# Patient Record
Sex: Female | Born: 1955
Health system: Southern US, Community
[De-identification: ages and names within clinical notes are randomized; demographics above are authoritative.]

## PROBLEM LIST (undated history)

## (undated) DIAGNOSIS — K529 Noninfective gastroenteritis and colitis, unspecified: Secondary | ICD-10-CM

## (undated) DIAGNOSIS — L409 Psoriasis, unspecified: Secondary | ICD-10-CM

## (undated) DIAGNOSIS — E039 Hypothyroidism, unspecified: Secondary | ICD-10-CM

## (undated) DIAGNOSIS — M109 Gout, unspecified: Secondary | ICD-10-CM

## (undated) DIAGNOSIS — T4145XA Adverse effect of unspecified anesthetic, initial encounter: Secondary | ICD-10-CM

## (undated) DIAGNOSIS — I10 Essential (primary) hypertension: Secondary | ICD-10-CM

## (undated) DIAGNOSIS — J189 Pneumonia, unspecified organism: Secondary | ICD-10-CM

## (undated) DIAGNOSIS — C50919 Malignant neoplasm of unspecified site of unspecified female breast: Secondary | ICD-10-CM

## (undated) DIAGNOSIS — Z853 Personal history of malignant neoplasm of breast: Secondary | ICD-10-CM

## (undated) DIAGNOSIS — K5190 Ulcerative colitis, unspecified, without complications: Secondary | ICD-10-CM

## (undated) DIAGNOSIS — Z923 Personal history of irradiation: Secondary | ICD-10-CM

## (undated) DIAGNOSIS — T8859XA Other complications of anesthesia, initial encounter: Secondary | ICD-10-CM

## (undated) DIAGNOSIS — E079 Disorder of thyroid, unspecified: Secondary | ICD-10-CM

## (undated) HISTORY — DX: Disorder of thyroid, unspecified: E07.9

## (undated) HISTORY — PX: TUBAL LIGATION: SHX77

## (undated) HISTORY — DX: Personal history of malignant neoplasm of breast: Z85.3

## (undated) HISTORY — DX: Pneumonia, unspecified organism: J18.9

## (undated) HISTORY — PX: BREAST BIOPSY: SHX20

## (undated) HISTORY — DX: Ulcerative colitis, unspecified, without complications: K51.90

## (undated) HISTORY — DX: Psoriasis, unspecified: L40.9

## (undated) HISTORY — DX: Gout, unspecified: M10.9

## (undated) HISTORY — DX: Noninfective gastroenteritis and colitis, unspecified: K52.9

## (undated) HISTORY — DX: Malignant neoplasm of unspecified site of unspecified female breast: C50.919

## (undated) HISTORY — PX: WISDOM TOOTH EXTRACTION: SHX21

---

## 1994-07-22 HISTORY — PX: DILATION AND CURETTAGE OF UTERUS: SHX78

## 2001-01-15 ENCOUNTER — Encounter: Payer: Self-pay | Admitting: *Deleted

## 2001-01-15 ENCOUNTER — Ambulatory Visit (HOSPITAL_COMMUNITY): Admission: RE | Admit: 2001-01-15 | Discharge: 2001-01-15 | Payer: Self-pay | Admitting: *Deleted

## 2001-01-20 ENCOUNTER — Other Ambulatory Visit: Admission: RE | Admit: 2001-01-20 | Discharge: 2001-01-20 | Payer: Self-pay | Admitting: *Deleted

## 2001-10-09 ENCOUNTER — Ambulatory Visit (HOSPITAL_COMMUNITY): Admission: RE | Admit: 2001-10-09 | Discharge: 2001-10-09 | Payer: Self-pay | Admitting: Internal Medicine

## 2001-10-09 HISTORY — PX: COLONOSCOPY: SHX174

## 2002-01-15 ENCOUNTER — Ambulatory Visit (HOSPITAL_COMMUNITY): Admission: RE | Admit: 2002-01-15 | Discharge: 2002-01-15 | Payer: Self-pay | Admitting: *Deleted

## 2002-01-15 ENCOUNTER — Encounter: Payer: Self-pay | Admitting: *Deleted

## 2002-01-28 ENCOUNTER — Encounter: Payer: Self-pay | Admitting: *Deleted

## 2002-01-28 ENCOUNTER — Ambulatory Visit (HOSPITAL_COMMUNITY): Admission: RE | Admit: 2002-01-28 | Discharge: 2002-01-28 | Payer: Self-pay | Admitting: *Deleted

## 2003-01-17 ENCOUNTER — Ambulatory Visit (HOSPITAL_COMMUNITY): Admission: RE | Admit: 2003-01-17 | Discharge: 2003-01-17 | Payer: Self-pay | Admitting: *Deleted

## 2003-01-17 ENCOUNTER — Encounter: Payer: Self-pay | Admitting: *Deleted

## 2004-01-30 ENCOUNTER — Ambulatory Visit (HOSPITAL_COMMUNITY): Admission: RE | Admit: 2004-01-30 | Discharge: 2004-01-30 | Payer: Self-pay | Admitting: *Deleted

## 2005-01-31 ENCOUNTER — Ambulatory Visit (HOSPITAL_COMMUNITY): Admission: RE | Admit: 2005-01-31 | Discharge: 2005-01-31 | Payer: Self-pay | Admitting: *Deleted

## 2005-02-20 ENCOUNTER — Ambulatory Visit (HOSPITAL_COMMUNITY): Admission: RE | Admit: 2005-02-20 | Discharge: 2005-02-20 | Payer: Self-pay | Admitting: *Deleted

## 2005-08-14 ENCOUNTER — Ambulatory Visit (HOSPITAL_COMMUNITY): Admission: RE | Admit: 2005-08-14 | Discharge: 2005-08-14 | Payer: Self-pay | Admitting: *Deleted

## 2005-11-19 DIAGNOSIS — K5190 Ulcerative colitis, unspecified, without complications: Secondary | ICD-10-CM

## 2005-11-19 HISTORY — DX: Ulcerative colitis, unspecified, without complications: K51.90

## 2005-12-05 ENCOUNTER — Ambulatory Visit (HOSPITAL_COMMUNITY): Admission: RE | Admit: 2005-12-05 | Discharge: 2005-12-05 | Payer: Self-pay | Admitting: Family Medicine

## 2005-12-10 ENCOUNTER — Encounter (INDEPENDENT_AMBULATORY_CARE_PROVIDER_SITE_OTHER): Payer: Self-pay | Admitting: *Deleted

## 2005-12-10 ENCOUNTER — Ambulatory Visit: Payer: Self-pay | Admitting: Internal Medicine

## 2005-12-10 ENCOUNTER — Ambulatory Visit (HOSPITAL_COMMUNITY): Admission: RE | Admit: 2005-12-10 | Discharge: 2005-12-10 | Payer: Self-pay | Admitting: Internal Medicine

## 2005-12-10 HISTORY — PX: COLONOSCOPY: SHX174

## 2005-12-18 ENCOUNTER — Ambulatory Visit: Payer: Self-pay | Admitting: Internal Medicine

## 2006-01-06 ENCOUNTER — Ambulatory Visit: Payer: Self-pay | Admitting: Internal Medicine

## 2006-02-17 ENCOUNTER — Encounter: Admission: RE | Admit: 2006-02-17 | Discharge: 2006-02-17 | Payer: Self-pay | Admitting: Obstetrics and Gynecology

## 2006-02-25 ENCOUNTER — Ambulatory Visit: Payer: Self-pay | Admitting: Internal Medicine

## 2006-02-26 ENCOUNTER — Ambulatory Visit: Payer: Self-pay | Admitting: Internal Medicine

## 2006-02-26 ENCOUNTER — Encounter (INDEPENDENT_AMBULATORY_CARE_PROVIDER_SITE_OTHER): Payer: Self-pay | Admitting: *Deleted

## 2006-02-26 ENCOUNTER — Ambulatory Visit (HOSPITAL_COMMUNITY): Admission: RE | Admit: 2006-02-26 | Discharge: 2006-02-26 | Payer: Self-pay | Admitting: Internal Medicine

## 2006-02-26 HISTORY — PX: FLEXIBLE SIGMOIDOSCOPY: SHX1649

## 2006-03-10 ENCOUNTER — Ambulatory Visit: Payer: Self-pay | Admitting: Internal Medicine

## 2006-04-07 ENCOUNTER — Ambulatory Visit: Payer: Self-pay | Admitting: Internal Medicine

## 2006-05-19 ENCOUNTER — Ambulatory Visit: Payer: Self-pay | Admitting: Internal Medicine

## 2006-07-04 ENCOUNTER — Ambulatory Visit: Payer: Self-pay | Admitting: Internal Medicine

## 2007-02-18 ENCOUNTER — Ambulatory Visit: Payer: Self-pay | Admitting: Internal Medicine

## 2007-02-20 ENCOUNTER — Ambulatory Visit (HOSPITAL_COMMUNITY): Admission: RE | Admit: 2007-02-20 | Discharge: 2007-02-20 | Payer: Self-pay | Admitting: Specialist

## 2007-03-15 ENCOUNTER — Emergency Department (HOSPITAL_COMMUNITY): Admission: EM | Admit: 2007-03-15 | Discharge: 2007-03-15 | Payer: Self-pay | Admitting: Emergency Medicine

## 2007-04-08 ENCOUNTER — Encounter (HOSPITAL_COMMUNITY): Admission: RE | Admit: 2007-04-08 | Discharge: 2007-04-21 | Payer: Self-pay | Admitting: Endocrinology

## 2007-08-21 ENCOUNTER — Ambulatory Visit: Payer: Self-pay | Admitting: Internal Medicine

## 2008-02-09 ENCOUNTER — Ambulatory Visit: Payer: Self-pay | Admitting: Internal Medicine

## 2008-02-24 ENCOUNTER — Ambulatory Visit (HOSPITAL_COMMUNITY): Admission: RE | Admit: 2008-02-24 | Discharge: 2008-02-24 | Payer: Self-pay | Admitting: Obstetrics & Gynecology

## 2008-03-23 ENCOUNTER — Other Ambulatory Visit: Admission: RE | Admit: 2008-03-23 | Discharge: 2008-03-23 | Payer: Self-pay | Admitting: Obstetrics & Gynecology

## 2008-11-28 ENCOUNTER — Encounter (HOSPITAL_COMMUNITY): Admission: RE | Admit: 2008-11-28 | Discharge: 2008-12-28 | Payer: Self-pay | Admitting: Endocrinology

## 2009-01-31 ENCOUNTER — Encounter (INDEPENDENT_AMBULATORY_CARE_PROVIDER_SITE_OTHER): Payer: Self-pay | Admitting: *Deleted

## 2009-03-01 DIAGNOSIS — R197 Diarrhea, unspecified: Secondary | ICD-10-CM

## 2009-03-01 DIAGNOSIS — R109 Unspecified abdominal pain: Secondary | ICD-10-CM | POA: Insufficient documentation

## 2009-03-01 DIAGNOSIS — K649 Unspecified hemorrhoids: Secondary | ICD-10-CM | POA: Insufficient documentation

## 2009-03-07 ENCOUNTER — Ambulatory Visit (HOSPITAL_COMMUNITY): Admission: RE | Admit: 2009-03-07 | Discharge: 2009-03-07 | Payer: Self-pay | Admitting: Obstetrics & Gynecology

## 2009-03-07 ENCOUNTER — Ambulatory Visit: Payer: Self-pay | Admitting: Internal Medicine

## 2009-03-11 ENCOUNTER — Encounter: Payer: Self-pay | Admitting: Internal Medicine

## 2009-03-15 LAB — CONVERTED CEMR LAB
BUN: 16 mg/dL (ref 6–23)
CO2: 25 meq/L (ref 19–32)
Calcium: 8.9 mg/dL (ref 8.4–10.5)
Creatinine, Ser: 0.71 mg/dL (ref 0.40–1.20)

## 2009-03-30 ENCOUNTER — Other Ambulatory Visit: Admission: RE | Admit: 2009-03-30 | Discharge: 2009-03-30 | Payer: Self-pay | Admitting: Obstetrics & Gynecology

## 2009-06-19 ENCOUNTER — Telehealth (INDEPENDENT_AMBULATORY_CARE_PROVIDER_SITE_OTHER): Payer: Self-pay

## 2009-06-28 ENCOUNTER — Telehealth (INDEPENDENT_AMBULATORY_CARE_PROVIDER_SITE_OTHER): Payer: Self-pay

## 2009-06-30 ENCOUNTER — Ambulatory Visit: Payer: Self-pay | Admitting: Gastroenterology

## 2009-06-30 DIAGNOSIS — K5190 Ulcerative colitis, unspecified, without complications: Secondary | ICD-10-CM | POA: Insufficient documentation

## 2009-07-26 ENCOUNTER — Telehealth (INDEPENDENT_AMBULATORY_CARE_PROVIDER_SITE_OTHER): Payer: Self-pay

## 2010-03-09 ENCOUNTER — Ambulatory Visit (HOSPITAL_COMMUNITY): Admission: RE | Admit: 2010-03-09 | Discharge: 2010-03-09 | Payer: Self-pay | Admitting: Obstetrics & Gynecology

## 2010-04-05 ENCOUNTER — Other Ambulatory Visit: Admission: RE | Admit: 2010-04-05 | Discharge: 2010-04-05 | Payer: Self-pay | Admitting: Obstetrics & Gynecology

## 2010-04-06 ENCOUNTER — Ambulatory Visit: Payer: Self-pay | Admitting: Internal Medicine

## 2010-06-28 ENCOUNTER — Telehealth (INDEPENDENT_AMBULATORY_CARE_PROVIDER_SITE_OTHER): Payer: Self-pay

## 2010-08-12 ENCOUNTER — Encounter: Payer: Self-pay | Admitting: Obstetrics and Gynecology

## 2010-08-13 ENCOUNTER — Encounter: Payer: Self-pay | Admitting: Obstetrics & Gynecology

## 2010-08-21 NOTE — Progress Notes (Signed)
  Phone Note Call from Patient   Caller: Patient Summary of Call: Pt left VM, she needs updated Rx for her current prescription for Lialda. She was on three tablets daily and is now on four  tablets daily. Please send new Rx to [facility] Her cell number is [phone] if any questions. Initial call taken by: Waldon Merl LPN,  July 26, 7844 4:01 PM     Appended Document: lialda    Prescriptions: LIALDA 1.2 GM TBEC (MESALAMINE) 4 by mouth daily  #120 x 11   Entered and Authorized by:   Laureen Ochs. Bernarda Caffey   Signed by:   Laureen Ochs Bernarda Caffey on 07/26/2009   Method used:   Electronically to        [facility] (retail)       [address]       [place], [place]  [address]       Ph: [phone]       Fax: [phone]   RxID:   [id number]

## 2010-08-21 NOTE — Assessment & Plan Note (Signed)
Summary: fu ov in 1 yr/colitis/jbb   Visit Type:  Follow-up Visit Primary Care Provider:  luking  Chief Complaint:  follow up 1 year- doing good.  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 Here for routine followup. Had a flare the first of the year. Brief course of prednisone took care of that. On Lialda  4.8 g daily; doing well with one to 2 bowel movements daily; really no GI symptoms now.  Blood pressure today 138/100.  Current Problems (verified): 1)  Inflammatory Bowel Disease  (ICD-558.9) 2)  Hx of Hemorrhoids  (ICD-455.6) 3)  Hx of Abdominal Cramps  (ICD-789.00) 4)  Hx of Diarrhea  (ICD-787.91)  Current Medications (verified): 1)  Lialda 1.2 Gm Tbec (Mesalamine) .... 4 By Mouth Daily  Allergies (verified): No Known Drug Allergies  Past History:  Past Surgical History: Last updated: 03/01/2009 Tubal ligation Wisdom teeth extraction  Family History: Last updated: 2009-04-03 Father: deceased age 28's   ? heart Mother: Living age 45  healthy Siblings: one brother   healthy  Social History: Last updated: April 03, 2009 Marital Status: Married Children: one  Occupation: Medical illustrator  Vital Signs:  Patient profile:   55 year old female Height:      64 inches Weight:      168 pounds BMI:     28.94 Temp:     98.8 degrees F oral Pulse rate:   60 / minute BP sitting:   138 / 100  (left arm) Cuff size:   regular  Vitals Entered By: Burnadette Peter LPN (April 06, 1469 8:36 AM)  Physical Exam  General:  alert conversant comfortable appearing Abdomen:  somewhat obese positive bowel sounds soft, nontender without appreciable mass organomegaly  Impression & Recommendations: Impression: Indeterminate colitis in remission on lialda. Elevated blood pressure today.  Recommendations: Continue lialda 4.8 g daily  Office visit here one year with BMET  Patient is to go by her PCP's  office for a repeat the blood pressure  check.  Other Orders: Future Orders: T-Basic Metabolic Panel ([id number]) ... 04/01/2011  Appended Document: Orders Update    Clinical Lists Changes  Orders: Added new Service order of Est. Patient Level III (37096) - Signed

## 2010-08-21 NOTE — Progress Notes (Signed)
Summary: lialda rx  Phone Note Call from Patient Call back at Home Phone [phone]   Caller: Patient Summary of Call: pt called- needs refills on her [place] . Pt uses [facility]. Initial call taken by: Burnadette Peter LPN,  June 28, 2102 9:11 AM     Appended Document: lialda rx    Prescriptions: LIALDA 1.2 GM TBEC (MESALAMINE) 4 by mouth daily  #120 x 11   Entered and Authorized by:   Laban Emperor NP   Signed by:   Laban Emperor NP on 06/28/2010   Method used:   Faxed to ...       [facility] (retail)       [address]       [place], [place]  [address]       Ph: [phone]       Fax: [phone]   RxID:   [id number]

## 2010-12-04 NOTE — Assessment & Plan Note (Signed)
NAMEGRACEANN, Beth Hamilton                CHART#:  [id number]   DATE:  08/21/2007                       DOB:  [birth year]   FOLLOW-UP:  Indeterminate colitis.   Ms. Bacha has done very well.  She has had some problems with  hyperthyroidism; has had a fine-needle aspirate biopsy of her thyroid  and is on some questioned suppressive agent for hyperthyroidism.  She  does not know what she is taking right now (although I believe it may be  methimazole).  Her colitis appears to be in remission.  She is having 1  or 2 bowel movements a day late, and no blood per rectum.  No diarrhea.  No __________.  No abdominal pain.  She feels well on 4.8 grams of  Lialda daily.  Her renal function was normal back on February 23, 2007.  She is concerned about the high cost of a month's worth of Lialda  (nearly $500 without a drug card).  We had some discussion about the  pros and cons of switching her to something else versus backing off on  therapy.  She has been doing well now for several months.   CURRENT MEDICATIONS:  See updated list.   ALLERGIES:  NO KNOWN DRUG ALLERGIES.   PHYSICAL EXAMINATION:  On exam today, she appears well.  Weight 154,  height 5 foot 4 inches, temperature 98.1, BP 138/90, pulse 76.  CHEST:  Lungs are clear to auscultation.  CARDIAC:  Regular rate and rhythm, without murmur, gallop or rub.  ABDOMEN:  Nondistended, positive bowel sounds.  Soft, nontender.  Without appreciable mass or organomegaly.   ASSESSMENT:  Indeterminate colitis.  In remission on 4.8 grams of  mesalamine daily in the form of Lialda.   RECOMMENDATIONS:  I told Ms. Rosengrant we could probably easily drop back  to 3 Lialda tablets daily, and stay at that dose.  I plan to see her  back in 6 months.  I have given her  samples of Lialda.  If she has any recurrence of symptoms whatsoever,  she is to let me know in the interim.  Otherwise, plan to see her back  in 6 months.       [name], M.D.  Electronically Signed     RMR/MEDQ  D:  08/21/2007  T:  08/22/2007  Job:  [id number]   cc:   Nicki Reaper A. Wolfgang Phoenix, MD

## 2010-12-04 NOTE — Assessment & Plan Note (Signed)
[name], Hamilton                CHART#:  [id number]   DATE:  02/09/2008                       DOB:  04-Feb-1956   Followup indeterminate colitis.  Last seen on 08/21/07.  Since last been  seen, she has done and continues to do well on a Lialda.  She is taking  3 capsules daily.  One bowel movement each morning, sometimes 2.  No  tenesmus.  No abdominal pain.  No hematochezia.  She has had absolutely  no diarrhea.  She feels well.  Cost along the way with [facility] has been a  concern, for which a coupon card she is getting a break and tolerating  3.6 g of Lialda very well.  Her creatinine on 02/23/08 was 0.72.  She  has a history of toxic multinodular goiter.  She is taking Tapazole.  She tells me things were under good control under the care of Dr.  Ronnald Collum.   CURRENT MEDICATIONS:  Lialda and Tapazole.   ALLERGIES:  No known drug allergies.   FAMILY HISTORY:  There is no family history of inflammatory bowel  disease or colorectal neoplasia.   PHYSICAL EXAMINATION:  GENERAL:  A pleasant 55 year old lady resting  comfortably.  VITAL SIGNS:  Weight 154, height 5 feet 4 inch, temperature 93, BP  120/88, and pulse 80.  ABDOMEN:  Flat.  Positive bowel sounds.  Entirely soft and nontender  without appreciable mass or organomegaly.   ASSESSMENT:  Indeterminate colitis, in remission on Lialda.   RECOMMENDATIONS:  We discussed the possibilities of changing to another  mesalamine preparation, but I am unknown of what the bottom line cost  would be to the patient.  We mutually agreed since she is really doing  extremely well on her current regimen of Lialda, not to  change anything at this time.  She is to continue her current dose of  Lialda (3.6 g daily).  I will plan to see her back in 1 year and p.r.n.       R. Garfield Cornea, M.D.  Electronically Signed     RMR/MEDQ  D:  02/09/2008  T:  02/09/2008  Job:  [id number]   cc:   Lenard Simmer, M.D.  Dr. Wolfgang Phoenix

## 2010-12-04 NOTE — Assessment & Plan Note (Signed)
NAMEBEATRYCE, Beth Hamilton                CHART#:  [id number]   DATE:  02/18/2007                       DOB:  09-16-1955   Follow up indeterminate colitis.  Last seen July 04, 2006.  She has  done well.  Bowel function back to normal at 1 bowel movement daily.  No  tenesmus, rectal bleeding, or abdominal pain.  She is tolerating 4.8 gm  of Lialda extremely well.  She continues to get her pharmacy card from  the drug company, and gets over 25 dollars a month, but she did check in  to getting it on her own, and was taken aback to find out that out of  pocket for 1 month therapy was cost her 529 dollars and 68 cents.  She  has been doing very well.  It has been some time since she had a MET-7.  Has not had any intercurrent medical illnesses.  Current regimen  includes Lialda.   ALLERGIES:  NO KNOWN DRUG ALLERGIES.   EXAMINATION:  Looks well.  Weight 149.  Height 5 feet 4 inches.  Temperature 99.1.  BP 150/90.  Pulse 76.  ABDOMEN:  Flat.  Positive bowel sounds.  Soft and nontender without  appreciable mass or organomegaly.   ASSESSMENT:  Indeterminate colitis in remission on Lialda.  As long as  she is getting it for 25 dollars a month, we will continue that  approach.  We can consider alternatives if cost becomes prohibitive.  We  will go ahead and check a BMET and CBC today.   She is concerned about weight loss from [facility] previously, although  according to height and weight table, she is within line for ideal body  weight.  We will go ahead and add a TSH on to her labs later in the  week.  I encouraged aerobic exercise, caloric restriction, healthy  lifestyle.       [name], M.D.  Electronically Signed     RMR/MEDQ  D:  02/18/2007  T:  02/19/2007  Job:  [id number]   cc:   Nicki Reaper A. Wolfgang Phoenix, MD

## 2010-12-07 NOTE — Op Note (Signed)
NAME:  Beth Hamilton, Beth Hamilton               ACCOUNT NO.:  [id number]   MEDICAL RECORD NO.:  [id number]          PATIENT TYPE:  AMB   LOCATION:  DAY                           FACILITY:  [facility]   PHYSICIAN:  R. Garfield Cornea, M.D. DATE OF BIRTH:  02-Nov-1955   DATE OF PROCEDURE:  02/26/2006  DATE OF DISCHARGE:                                 OPERATIVE REPORT   PROCEDURE:  Flexible sigmoidoscopy with biopsy.   ENDOSCOPIST:  Cristopher Estimable. Rourk, M.D.   INDICATIONS FOR PROCEDURE:  The patient is a 55 year old lady wit an episode  of abdominal cramps, bloody diarrhea back in May.  An incomplete colonoscopy  demonstrated significant colitis well into the ascending colon.  Exam was  not complete due to the degree of inflammation, patient's discomfort, and  relative rectal sparing.  Biopsies demonstrated nonspecific colitis.  She  was treated with antibiotics and a course of prednisone.  She improved;  Prednisone was tapered.  She did well until the past week or so when she  started having tenesmus and somewhat loose stools and the passage of some  pink fluid in her stool.  The question is whether or not she has new onset  of inflammatory bowel disease, or this is transient, insignificant bowel  complaints.  Flexible sigmoidoscopy is now being down to help sort things  out.  This approach has been discussed with the patient at length.  The  potential risks, benefits, and alternatives have been reviewed, please see  the documentation in the medical record.   PROCEDURE NOTE:  O2 saturation, blood pressure, pulse, and respirations were  monitored throughout the entire procedure.  The patient was placed in the  left lateral decubitus position.  No IV conscious sedation was given.   INSTRUMENT:  Olympus video chip pediatric scope.   FINDINGS:  A digital rectal exam revealed no abnormalities.   ENDOSCOPIC FINDINGS:  The prep was adequate (__________ enema).   RECTUM:  Examination of the rectal mucosa  revealed grossly normal rectal  mucosa; however, as the sigmoid was approached there was loss of the normal  vascular pattern, diffuse granularity and superficial erosions extending up  to 45 cm.  The scope was advanced in a nice 1:1 fashion; it was not advanced  any further due to the patient's discomfort and some formed stool elements  in the lumen upstream. Biopsies of the sigmoid colon were taken for  histologic study.  The patient tolerated the brief procedure, overall, very  well.   IMPRESSION:  1. Endoscopically normal-appearing rectum.  2. Abnormal appearing sigmoid mucosa to 45 cm as described above,      consistent with colitis, status post biopsy.  3. I suspect that the patient has new onset inflammatory bowel disease.   RECOMMENDATIONS:  1. We will start Lialda 1.2 gm daily.  She is to go by my office of a 1      month's supply of samples.  Will hold off on cortical steroids at this      time.  She is going to go out of town to the beach at the end  of the      week for a week.      She is instructed that if things do not start to improve she is to let      me know.  2. I have asked her to avoid nonsteroidal agents.  3. Will plan to see her back in the office in 2 weeks.      [name], M.D.  Electronically Signed     RMR/MEDQ  D:  02/26/2006  T:  02/26/2006  Job:  [id number]   cc:   Sherrilee Gilles. Gerarda Fraction, MD  Fax: [phone]

## 2010-12-07 NOTE — Op Note (Signed)
[facility]  Patient:    Beth Hamilton, Beth Hamilton Visit Number: [id number] MRN: [id number]          Service Type: END Location: DAY Attending Physician:  [name] Dictated by:   Garfield Cornea, M.D. Proc. Date: 10/09/01 Admit Date:  10/09/2001 Discharge Date: 10/09/2001   CC:         [name], M.D.   Operative Report  PROCEDURE:  Colonoscopy.  ENDOSCOPIST:  Garfield Cornea, M.D.  INDICATIONS:  The patient is a 55 year old lady with recent grossly blood diarrhea.  Symptoms are temporarily related to a course of antibiotic therapy. She was seen in my office on September 23, 2001.  By that time, she was much improved.  Since September 23, 2001, her symptoms have totally resolved.  She is having one formed bowel movement daily.  No family history of colorectal neoplasia.  Colonoscopy is now being done to further evaluate blood per rectum.  This approach has been discussed the patient previously.  Potential risks, benefits and alternatives have been discussed and questions answered. She is agreeable.  DESCRIPTION OF PROCEDURE:  Oxygen saturation, pulse oximetry, blood pressure and respirations were monitored throughout the entire procedure.  Conscious sedation with Versed 4 mg IV, Demerol 75 mg IV in divided doses.  Instrument was Olympus video colonoscope.  Findings:  Digital rectal examination revealed no abnormalities on endoscopic findings.  Prep was good.  Examination of the rectal mucosa including retroflexed view of the anal verge revealed two anal papilla and internal hemorrhoids.  Otherwise rectal mucosa appeared entirely normal.  The colonic mucosa was surveyed from the rectosigmoid junction through the left and right transverse colon to the area of the appendiceal orifice, ileocecal valve and cecum.  These structures were well seen and photographed.  The colonic mucosa all the way to the cecum appeared normal.  From the level of the cecum and  the ileocecal valve, the scope was slowly withdrawn.  All previously mentioned mucosal surfaces were again seen and again no other abnormalities were observed.  The patient tolerated the procedure well and was reactive in endoscopy.  IMPRESSION: 1. Internal hemorrhoids and anal papilla; otherwise normal rectum. 2. Normal colon.  I suspect the patients recent bout of bloody diarrhea was due to self limiting colitis, possibly antibiotic associated at any rate  Now she is asymptomatic.  Todays findings are reassuring.  RECOMMENDATIONS: 1. Follow up with Dr. [name] as needed. 2. Consider screening colonoscopy in 10 years. Dictated by:   Garfield Cornea, M.D. Attending Physician:  [name] DD:  10/09/01 TD:  10/12/01 Job: [id number]

## 2010-12-07 NOTE — H&P (Signed)
Beth Hamilton, Beth Hamilton               ACCOUNT NO.:  [id number]   MEDICAL RECORD NO.:  [id number]          PATIENT TYPE:  OUT   LOCATION:  RAD                           FACILITY:  [facility]   PHYSICIAN:  R. Garfield Cornea, M.D. DATE OF BIRTH:  12-24-1955   DATE OF ADMISSION:  DATE OF DISCHARGE:  LH                                HISTORY & PHYSICAL   CHIEF COMPLAINT:  Left lower quadrant abdominal pain, tenesmus, pink-  tinged loose stools.   HISTORY OF PRESENT ILLNESS:  Beth Hamilton was last seen 01/06/06.  We had seen  her in the wake of an illness characterized by abdominal cramps and blood  per rectum.  Her stool studies came back negative, she underwent an  incomplete colonoscopy Dec 10, 2005 which demonstrated normal rectum,  granularity and friability of the sigmoid colon, marked inflammatory changes  of the descending colon all the way up to the ascending colon.  Exam was  incomplete given the degree of inflammation and patient discomfort.  Biopsies came back nonspecific colitis, [facility] IBD panel came back  negative.  She ended up improving ultimately on Flagyl followed by Cipro  concomitant with some prednisone.  She came off prednisone some time ago  (right at 6 weeks ago).  She was back to baseline until the past 4 days.  She called me at home with a complaint over this past weekend, symptoms have  persisted.  She has not had a fever, nausea or vomiting or anything like  that.  The issue is if she is having recurrence persistent of idiopathic  inflammatory bowel disease or are these nonspecific symptoms.  Her sense of  well being has been good, she has been working.  She gained 1 pound since  she was seen on 01/06/06.   LABORATORY DATA:  Prior CT of the abdomen and pelvis 12/05/05 demonstrated  diffuse colitis extending from approximately the hepatic flexure to the  sigmoid, relative sparing of the very distal sigmoid and rectum.   PAST MEDICAL HISTORY:  Unremarkable for chronic  illnesses, status post tubal  ligation, wisdom teeth extraction previously.   CURRENT MEDICATIONS:  None.   ALLERGIES:  No known drug allergies.   FAMILY HISTORY:  Negative for chronic GI or liver disease.   SOCIAL HISTORY:  Patient has been married for 16 years, she has one child.  She works for [employer].  She smokes a 1/2 pack of cigarettes per day  and has 2 glasses of wine in the evening.   REVIEW OF SYSTEMS:  No chest pain, dyspnea, exertional changes, change in  weight, fevers or chills.   PHYSICAL EXAMINATION:  GENERAL:  Reveals a pleasant 55 year old lady resting  comfortably.  VITAL SIGNS:  Weight 137.5, height 5 feet 4 inches, temperature 99.1, blood  pressure 134/88, pulse 80.  SKIN:  Warm and dry.  HEENT:  No scleral icterus.  Conjunctivae are pink.  CHEST/LUNGS:  Clear to auscultation.  CARDIAC:  Regular rate and rhythm without murmur, gallop or rub.  ABDOMEN:  Nondistended, positive bowel sounds, soft.  She has some minimal  left  upper quadrant tenderness to palpation.  EXTREMITIES:  No edema.  RECTAL:  Good sphincter tone.  Rectal vault is empty except for scant brown  stool and mucus which is hemoccult positive.   IMPRESSION:  Beth Hamilton is a 55 year old lady with a recent episode of  colitis felt more likely to be infectious.  Previously she did respond  ultimately to a course of broad spectrum antibiotic therapy and a brief  course of corticosteroids.  She improved back to her baseline until over the  past 5 days.  She is quite anxious about her situation, she is wondering if  she really does not have colitis and the question indeed has now been  raised.  She tells me she is going on vacation down to the beach all of next  week and does not want to be sick.  At this point in time I am somewhat  concerned she may have idiopathic inflammatory bowel disease but certainly  that has not been confirmed at this point in time.  I told her the best  thing to  do is to go ahead and do an enema prep, flex sig tomorrow morning  and look at her rectum and distal sigmoid.  IF she does have any  proctocolitis then I would start treating in the direction of idiopathic  inflammatory bowel disease.  The risks, benefits and alternatives have been  reviewed, questions answered.  She is agreeable with this approach and we  will get her scheduled forth with so we can expedite evaluation and  treatment.      [name], M.D.  Electronically Signed     RMR/MEDQ  D:  02/25/2006  T:  02/25/2006  Job:  [id number]   cc:   Sherrilee Gilles. Gerarda Fraction, MD  Fax: [phone]

## 2010-12-07 NOTE — Op Note (Signed)
NAME:  Beth Hamilton, Beth Hamilton               ACCOUNT NO.:  [id number]   MEDICAL RECORD NO.:  [id number]          PATIENT TYPE:  AMB   LOCATION:  DAY                           FACILITY:  [facility]   PHYSICIAN:  R. Garfield Cornea, M.D. DATE OF BIRTH:  1956/07/16   DATE OF PROCEDURE:  12/10/2005  DATE OF DISCHARGE:                                 OPERATIVE REPORT   PROCEDURE:  Incomplete colonoscopy and biopsy, stool sampling.   INDICATIONS FOR PROCEDURE:  The patient is a 55 year old lady who is  referred by Dr. [name] for fairly acute illness characterized by  abdominal cramps, fever, nonbloody diarrhea of nearly two weeks duration.  She is known now to have a leukocytosis on repeat CBCs.  She has not had any  nausea or vomiting recently, although she was treated empirically with some  Flagyl last week and did have some nausea and vomiting associated with the  administration of that medication.  Stool studies for culture and C. diff  have negative times two per Dr. [name].  CT has demonstrated colitis  involving the descending and transverse colon primarily.   She has not been on any antibiotics prior to her illness.  She did take a  three day course of Xifaxan and she perceived modest improvement in her  symptoms, then she ran out of samples.  There is no history of inflammatory  bowel disease or colorectal cancer in her family.  She does smoke.   She underwent a colonoscopy by me for diagnostic purposes because of bloody  diarrhea in the wake of antibiotic administration, back in 2003.  She was  found to have internal hemorrhoids, otherwise normal rectum and colon.  Colonoscopy is now being done to further evaluate her symptoms and the  procedure has been discussed with the patient's husband and her at length.  Potential risks, benefits and alternatives have been reviewed, questions  answered, she has agreeable.  Please see documentation in medical record.   PROCEDURE NOTE:  O2  saturation and blood pressure __________ monitored  throughout the entire procedure.  Conscious sedation with IV Versed and  Demerol in incremental doses.   FINDINGS:  Digital rectal exam revealed no abnormalities.   ENDOSCOPIC FINDINGS:  Patient's prep was adequate from rectum to colon.  Examination of rectal mucosa, including retroflexed view in the anal verge,  revealed no rectal mucosal abnormalities.   COLON:  Colonic mucosa was surveyed from the rectosigmoid junction  proximally, beginning in the sigmoid colon.  There was some mild granularity  and friability of the colonic mucosa up into the beginning of the distal  descending colon.  There was marked inflammatory changes with marked bowel  wall edema, erosions and ulcerations.  These inflammatory changes were noted  upon advancing the scope well across the transverse colon into the area of  what I believe was the hepatic flexure/distal ascending colon.  There was  marked edema and compromise of the lumen.  Because of her discomfort and the  degree of inflammation, I elected not to attempt further progression to the  cecum.  From this  level the scope was withdrawn.  Stool residue was  suctioned out to send for fresh stool studies.  Biopsies of the area of the  hepatic flexure and descending colon, as well as sigmoid were taken for  histology.  There were no pseudomembranes.  The patient tolerated the  procedure overall well __________ endoscopy.   IMPRESSION:  1.  Endoscopically normal appearing rectum.  2.  Granularity/friability of the sigmoid colon, marked inflammatory changes      of the descending colon all the Beth Hamilton into the area of the distal      ascending colon as described above, consistent with colitis.  Etiology      at this time is not well defined.  This could be an infectious process.      Alternatively, ischemia and the possibility of new onset inflammatory      bowel disease is not excluded at this time.   She  does take ibuprofen regularly which may be compounding the clinical  picture.   RECOMMENDATIONS:  1.  No nonsteroidal agents whatsoever.  2.  Follow up on repeat stool studies.  3.  Stat CBC and met-7 while she is here.  4.  Follow up on biopsies.  5.  Further recommendations to follow in the very near future.      [name], M.D.  Electronically Signed     RMR/MEDQ  D:  12/10/2005  T:  12/10/2005  Job:  [id number]   cc:   Nicki Reaper A. Wolfgang Phoenix, MD  Fax: [phone]   R. Garfield Cornea, M.D.  [id number]    [place] [address]

## 2011-02-06 ENCOUNTER — Other Ambulatory Visit: Payer: Self-pay | Admitting: Obstetrics & Gynecology

## 2011-02-06 DIAGNOSIS — Z139 Encounter for screening, unspecified: Secondary | ICD-10-CM

## 2011-03-11 ENCOUNTER — Ambulatory Visit (HOSPITAL_COMMUNITY)
Admission: RE | Admit: 2011-03-11 | Discharge: 2011-03-11 | Disposition: A | Discharge status: Home / Self Care | Payer: BC Managed Care – PPO | Source: Ambulatory Visit | Attending: Obstetrics & Gynecology | Admitting: Obstetrics & Gynecology

## 2011-03-11 DIAGNOSIS — Z139 Encounter for screening, unspecified: Secondary | ICD-10-CM

## 2011-03-11 DIAGNOSIS — Z1231 Encounter for screening mammogram for malignant neoplasm of breast: Secondary | ICD-10-CM | POA: Insufficient documentation

## 2011-03-16 ENCOUNTER — Other Ambulatory Visit: Payer: Self-pay | Admitting: Internal Medicine

## 2011-03-16 LAB — BASIC METABOLIC PANEL
BUN: 15 mg/dL (ref 6–23)
Calcium: 9.9 mg/dL (ref 8.4–10.5)
Creat: 0.7 mg/dL (ref 0.50–1.10)
Glucose, Bld: 91 mg/dL (ref 70–99)
Potassium: 4.6 mEq/L (ref 3.5–5.3)
Sodium: 144 mEq/L (ref 135–145)

## 2011-04-04 ENCOUNTER — Telehealth: Payer: Self-pay

## 2011-04-04 NOTE — Telephone Encounter (Signed)
Pt called requesting lab results. Informed pt of results. When does pt need to repeat labs? Follow up ov? Please advise.

## 2011-04-05 ENCOUNTER — Encounter: Payer: Self-pay | Admitting: Internal Medicine

## 2011-04-08 ENCOUNTER — Other Ambulatory Visit (HOSPITAL_COMMUNITY)
Admission: RE | Admit: 2011-04-08 | Discharge: 2011-04-08 | Disposition: A | Discharge status: Home / Self Care | Payer: BC Managed Care – PPO | Source: Ambulatory Visit | Attending: Obstetrics & Gynecology | Admitting: Obstetrics & Gynecology

## 2011-04-08 ENCOUNTER — Other Ambulatory Visit: Payer: Self-pay | Admitting: Obstetrics & Gynecology

## 2011-04-08 DIAGNOSIS — Z01419 Encounter for gynecological examination (general) (routine) without abnormal findings: Secondary | ICD-10-CM | POA: Insufficient documentation

## 2011-04-09 NOTE — Telephone Encounter (Signed)
bmet all normal; recommend repeat in 1 year

## 2011-04-10 NOTE — Telephone Encounter (Signed)
Mailed letter to pt

## 2011-06-21 ENCOUNTER — Ambulatory Visit: Payer: BC Managed Care – PPO | Admitting: Internal Medicine

## 2011-07-18 ENCOUNTER — Other Ambulatory Visit: Payer: Self-pay | Admitting: Gastroenterology

## 2011-07-18 NOTE — Telephone Encounter (Signed)
Needs OV in next 3 months.

## 2011-07-24 NOTE — Telephone Encounter (Signed)
Pt is aware of OV on 1/25 at 0800 with RMR

## 2011-08-16 ENCOUNTER — Encounter: Payer: Self-pay | Admitting: Internal Medicine

## 2011-08-16 ENCOUNTER — Ambulatory Visit (INDEPENDENT_AMBULATORY_CARE_PROVIDER_SITE_OTHER): Payer: BC Managed Care – PPO | Admitting: Internal Medicine

## 2011-08-16 VITALS — BP 145/95 | HR 85 | Temp 98.5°F | Ht 64.0 in | Wt 162.6 lb

## 2011-08-16 DIAGNOSIS — K5289 Other specified noninfective gastroenteritis and colitis: Secondary | ICD-10-CM

## 2011-08-16 NOTE — Assessment & Plan Note (Signed)
Overall, indeterminate colitis in remission. Her last full colonoscopy was negative in 2003(predating diagnosis of colitis). Since that time, in 2007, she had a limited colonoscopy and a sigmoidoscopy. No complete examination of her colon since 2003. Therefore, she is due for routine screening at this time. I discussed this approach with this nice lady. She will consider getting this done later in the year.  BP up a bit.  Recommendations: Continue present dose of Lialda. Followup on blood pressure. Office visit with [place] in 4 months to consider setting up a colonoscopy

## 2011-08-16 NOTE — Patient Instructions (Signed)
   Continue present regimen  Office visit in 4 months to set up colonoscopy

## 2011-08-16 NOTE — Progress Notes (Signed)
Primary Care Physician:  [name], MD, MD Primary Gastroenterologist:  Dr. Gala Romney  Pre-Procedure History & Physical: HPI:  Beth Hamilton is a 56 y.o. female here for indeterminate colitis. Doing extremely well on 420 g apply [place] daily. Renal function normal back in August of last year. One to 2 bowel movements daily no new bloody bowel movements no diarrhea. No nausea vomiting. No abdominal pain. Feels overall very well.  Past Medical History  Diagnosis Date  . IBD (inflammatory bowel disease)   . Hemorrhoids     Past Surgical History  Procedure Date  . Tubal ligation   . Wisdom tooth extraction   . Colonoscopy 10/09/2001    hemorrhoids  . Colonoscopy 12/10/2005    incomplete tcs-  colitis  . Flexible sigmoidoscopy 02/26/2006    endoscopically normal-appearing rectum, colitis in sigmoid mucosa    Prior to Admission medications   Medication Sig Start Date End Date Taking? Authorizing Provider  LIALDA 1.2 G EC tablet TAKE 4 TABLETS BY MOUTH DAILY. 07/18/11  Yes Neil Crouch, PA    Allergies as of 08/16/2011  . (No Known Allergies)    No family history on file.  History   Social History  . Marital Status: Married    Spouse Name: N/A    Number of Children: N/A  . Years of Education: N/A   Occupational History  . Not on file.   Social History Main Topics  . Smoking status: Never Smoker   . Smokeless tobacco: Not on file  . Alcohol Use: No  . Drug Use: No  . Sexually Active: Not on file   Other Topics Concern  . Not on file   Social History Narrative  . No narrative on file    Review of Systems: See HPI, otherwise negative ROS  Physical Exam: BP 145/95  Pulse 85  Temp(Src) 98.5 F (36.9 C) (Temporal)  Ht 5' 4"  (1.626 m)  Wt 162 lb 9.6 oz (73.755 kg)  BMI 27.91 kg/m2 General:   Alert,  Well-developed, well-nourished, pleasant and cooperative in NAD Skin:  Intact without significant lesions or rashes. Eyes:  Sclera clear, no icterus.   Conjunctiva  pink. Ears:  Normal auditory acuity. Nose:  No deformity, discharge,  or lesions. Mouth:  No deformity or lesions. Neck:  Supple; no masses or thyromegaly. No significant cervical adenopathy. Lungs:  Clear throughout to auscultation.   No wheezes, crackles, or rhonchi. No acute distress. Heart:  Regular rate and rhythm; no murmurs, clicks, rubs,  or gallops. Abdomen: Non-distended, normal bowel sounds.  Soft and nontender without appreciable mass or hepatosplenomegaly.  Pulses:  Normal pulses noted. Extremities:  Without clubbing or edema.  Impression/Plan:

## 2011-11-21 ENCOUNTER — Encounter: Payer: Self-pay | Admitting: Internal Medicine

## 2012-02-05 ENCOUNTER — Other Ambulatory Visit: Payer: Self-pay | Admitting: Obstetrics & Gynecology

## 2012-02-05 DIAGNOSIS — Z139 Encounter for screening, unspecified: Secondary | ICD-10-CM

## 2012-03-12 ENCOUNTER — Ambulatory Visit (HOSPITAL_COMMUNITY)
Admission: RE | Admit: 2012-03-12 | Discharge: 2012-03-12 | Disposition: A | Discharge status: Home / Self Care | Payer: BC Managed Care – PPO | Source: Ambulatory Visit | Attending: Obstetrics & Gynecology | Admitting: Obstetrics & Gynecology

## 2012-03-12 DIAGNOSIS — R922 Inconclusive mammogram: Secondary | ICD-10-CM | POA: Insufficient documentation

## 2012-03-12 DIAGNOSIS — Z139 Encounter for screening, unspecified: Secondary | ICD-10-CM

## 2012-03-12 DIAGNOSIS — Z1231 Encounter for screening mammogram for malignant neoplasm of breast: Secondary | ICD-10-CM | POA: Insufficient documentation

## 2012-03-16 ENCOUNTER — Other Ambulatory Visit: Payer: Self-pay | Admitting: Obstetrics & Gynecology

## 2012-03-16 DIAGNOSIS — R928 Other abnormal and inconclusive findings on diagnostic imaging of breast: Secondary | ICD-10-CM

## 2012-03-24 ENCOUNTER — Ambulatory Visit
Admission: RE | Admit: 2012-03-24 | Discharge: 2012-03-24 | Disposition: A | Discharge status: Home / Self Care | Payer: BC Managed Care – PPO | Source: Ambulatory Visit | Attending: Obstetrics & Gynecology | Admitting: Obstetrics & Gynecology

## 2012-03-24 DIAGNOSIS — R928 Other abnormal and inconclusive findings on diagnostic imaging of breast: Secondary | ICD-10-CM

## 2012-03-25 ENCOUNTER — Encounter (HOSPITAL_COMMUNITY): Payer: BC Managed Care – PPO

## 2012-04-17 ENCOUNTER — Other Ambulatory Visit (HOSPITAL_COMMUNITY)
Admission: RE | Admit: 2012-04-17 | Discharge: 2012-04-17 | Disposition: A | Discharge status: Home / Self Care | Payer: BC Managed Care – PPO | Source: Ambulatory Visit | Attending: Obstetrics & Gynecology | Admitting: Obstetrics & Gynecology

## 2012-04-17 ENCOUNTER — Other Ambulatory Visit: Payer: Self-pay | Admitting: Obstetrics & Gynecology

## 2012-04-17 DIAGNOSIS — Z01419 Encounter for gynecological examination (general) (routine) without abnormal findings: Secondary | ICD-10-CM | POA: Insufficient documentation

## 2012-07-01 ENCOUNTER — Telehealth: Payer: Self-pay | Admitting: Internal Medicine

## 2012-07-01 NOTE — Telephone Encounter (Signed)
Pt was due for ov in May 2013, she was also due for tcs in 2013 and has not had any blood work since 02/2011.  Routing to refill box for review of refill request.

## 2012-07-01 NOTE — Telephone Encounter (Signed)
Patient only has 1 refill on her Lialda and shes wondering does she need to come in before a refill can be called in or not please advise?

## 2012-07-02 MED ORDER — MESALAMINE 1.2 G PO TBEC: 4.8000 g | DELAYED_RELEASE_TABLET | Freq: Every day | ORAL | Status: DC

## 2012-07-02 NOTE — Addendum Note (Signed)
Addended by: Orvil Feil on: 07/02/2012 12:57 PM   Modules accepted: Orders

## 2012-07-02 NOTE — Telephone Encounter (Signed)
Needs BMP, CBC, OV to discuss colonoscopy.  Will refill X 1.

## 2012-07-02 NOTE — Telephone Encounter (Signed)
LMOM for her to call back and make follow up appointment.

## 2012-07-03 ENCOUNTER — Other Ambulatory Visit: Payer: Self-pay

## 2012-07-03 DIAGNOSIS — K529 Noninfective gastroenteritis and colitis, unspecified: Secondary | ICD-10-CM

## 2012-07-04 ENCOUNTER — Other Ambulatory Visit: Payer: Self-pay | Admitting: Internal Medicine

## 2012-07-05 LAB — CBC WITH DIFFERENTIAL/PLATELET
Eosinophils Absolute: 0.1 10*3/uL (ref 0.0–0.7)
Eosinophils Relative: 1 % (ref 0–5)
Lymphocytes Relative: 44 % (ref 12–46)
Lymphs Abs: 2.7 10*3/uL (ref 0.7–4.0)
MCH: 33.4 pg (ref 26.0–34.0)
MCHC: 32.6 g/dL (ref 30.0–36.0)
Monocytes Absolute: 0.4 10*3/uL (ref 0.1–1.0)
Neutro Abs: 2.9 10*3/uL (ref 1.7–7.7)
RBC: 4.28 MIL/uL (ref 3.87–5.11)
WBC: 6.1 10*3/uL (ref 4.0–10.5)

## 2012-07-05 LAB — BASIC METABOLIC PANEL
CO2: 28 mEq/L (ref 19–32)
Calcium: 9.4 mg/dL (ref 8.4–10.5)
Chloride: 104 mEq/L (ref 96–112)
Creat: 0.73 mg/dL (ref 0.50–1.10)
Glucose, Bld: 88 mg/dL (ref 70–99)
Sodium: 139 mEq/L (ref 135–145)

## 2012-07-06 NOTE — Telephone Encounter (Signed)
Pt has an appointment on 07/13/12 at 9:00 with [facility]

## 2012-07-09 ENCOUNTER — Encounter: Payer: Self-pay | Admitting: Internal Medicine

## 2012-07-13 ENCOUNTER — Other Ambulatory Visit: Payer: Self-pay

## 2012-07-13 ENCOUNTER — Ambulatory Visit (INDEPENDENT_AMBULATORY_CARE_PROVIDER_SITE_OTHER): Payer: BC Managed Care – PPO | Admitting: Gastroenterology

## 2012-07-13 ENCOUNTER — Encounter: Payer: Self-pay | Admitting: Gastroenterology

## 2012-07-13 VITALS — BP 152/96 | HR 74 | Temp 98.3°F | Ht 64.0 in | Wt 169.4 lb

## 2012-07-13 DIAGNOSIS — K529 Noninfective gastroenteritis and colitis, unspecified: Secondary | ICD-10-CM

## 2012-07-13 DIAGNOSIS — K5289 Other specified noninfective gastroenteritis and colitis: Secondary | ICD-10-CM

## 2012-07-13 MED ORDER — MESALAMINE 1.2 G PO TBEC: 4.8000 g | DELAYED_RELEASE_TABLET | Freq: Every day | ORAL | Status: DC

## 2012-07-13 NOTE — Patient Instructions (Addendum)
Please have blood work completed in 6 weeks. We will send the orders to you around that time.  Start taking a women's multivitamin.  Limit alcohol consumption.   Call Dr. Lance Sell office about your blood pressure. He may want you to take it at home to document any fluctuations.   We will see you back in 6 months!

## 2012-07-13 NOTE — Progress Notes (Signed)
Referring Provider: [name], MD Primary Care Physician:  [name], MD Primary GI: Dr. Gala Romney   Chief Complaint  Patient presents with  . Medication Refill    HPI:   56 year old female with hx of indeterminate colitis, here for routine 6 mos f/u. Appears last complete TCS in 2003. Incomplete TCS in 2007, with subsequent flex sig same year. Somewhat overdue for complete colonoscopy. Wants to hold off on procedure right now. Has skipped a day or so of Lialda. Having no problems whatsoever. No constipation. Has BM 1-2 times per day. No rectal bleeding. Drinks a couple of glasses of wine a night. MCV mildly elevated on most recent CBC.   Lab Results  Component Value Date   WBC 6.1 07/04/2012   HGB 14.3 07/04/2012   HCT 43.8 07/04/2012   MCV 102.3* 07/04/2012   PLT 243 07/04/2012    Past Medical History  Diagnosis Date  . IBD (inflammatory bowel disease)   . Hemorrhoids     Past Surgical History  Procedure Date  . Tubal ligation   . Wisdom tooth extraction   . Colonoscopy 10/09/2001    RMR: Internal hemorrhoids and anal papilla; otherwise normal rectum  . Colonoscopy 12/10/2005    incomplete tcs-  colitis  . Flexible sigmoidoscopy 02/26/2006    endoscopically normal-appearing rectum, colitis in sigmoid mucosa    Current Outpatient Prescriptions  Medication Sig Dispense Refill  . ARMOUR THYROID 30 MG tablet Take 30 mg by mouth daily.       . mesalamine (LIALDA) 1.2 G EC tablet Take 4 tablets (4.8 g total) by mouth daily.  120 tablet  1    Allergies as of 07/13/2012  . (No Known Allergies)      History   Social History  . Marital Status: Married    Spouse Name: N/A    Number of Children: N/A  . Years of Education: N/A   Social History Main Topics  . Smoking status: Never Smoker   . Smokeless tobacco: None  . Alcohol Use: No  . Drug Use: No  . Sexually Active: None   Other Topics Concern  . None   Social History Narrative  . None    Review of  Systems: See HPI  Physical Exam: BP 167/98  Pulse 74  Temp 98.3 F (36.8 C) (Oral)  Ht 5' 4"  (1.626 m)  Wt 169 lb 6.4 oz (76.839 kg)  BMI 29.08 kg/m2 General:   Alert and oriented. No distress noted. Pleasant and cooperative.  Head:  Normocephalic and atraumatic. Eyes:  Conjuctiva clear without scleral icterus. Mouth:  Oral mucosa pink and moist. Good dentition. No lesions. Heart:  S1, S2 present without murmurs, rubs, or gallops. Regular rate and rhythm. Abdomen:  +BS, soft, non-tender and non-distended. No rebound or guarding. No HSM or masses noted. Msk:  Symmetrical without gross deformities. Normal posture. Extremities:  Without edema. Neurologic:  Alert and  oriented x4;  grossly normal neurologically. Skin:  Intact without significant lesions or rashes. Psych:  Alert and cooperative. Normal mood and affect.

## 2012-07-20 NOTE — Assessment & Plan Note (Signed)
56 year old with indeterminate colitis, last complete TCS in 2003, last incomplete in 2007. Somewhat overdue for surveillance. Pt would like to hold off on colonoscopy at this point. Doing well on Lialda 4.8g daily. As of note, mild elevation of MCV. Unclear etiology at this point. Does note drinks several glasses of wine a night. Add complete multivitamin, recheck CBC in 6 weeks. OV in 6 months. To call if changes mind about colonoscopy. Considerer further workup if MCV remains elevated (?B12, folate? )

## 2012-07-21 NOTE — Progress Notes (Signed)
Faxed to PCP

## 2012-08-22 LAB — CBC WITH DIFFERENTIAL/PLATELET
HCT: 40.8 % (ref 36.0–46.0)
Hemoglobin: 13.6 g/dL (ref 12.0–15.0)
Lymphs Abs: 2.6 10*3/uL (ref 0.7–4.0)
Monocytes Relative: 8 % (ref 3–12)
Neutro Abs: 3.2 10*3/uL (ref 1.7–7.7)
Neutrophils Relative %: 51 % (ref 43–77)
RBC: 4.19 MIL/uL (ref 3.87–5.11)

## 2012-08-25 NOTE — Progress Notes (Signed)
Quick Note:  Good news: MCV has normalized. No need for further work-up unless it changes in the future. Has she cut back on wine? ______

## 2012-12-03 ENCOUNTER — Other Ambulatory Visit: Payer: Self-pay | Admitting: Obstetrics and Gynecology

## 2012-12-03 DIAGNOSIS — A6009 Herpesviral infection of other urogenital tract: Secondary | ICD-10-CM

## 2012-12-11 ENCOUNTER — Ambulatory Visit (INDEPENDENT_AMBULATORY_CARE_PROVIDER_SITE_OTHER): Payer: BC Managed Care – PPO | Admitting: Gastroenterology

## 2012-12-11 ENCOUNTER — Encounter: Payer: Self-pay | Admitting: Gastroenterology

## 2012-12-11 VITALS — BP 160/90 | HR 82 | Temp 98.2°F | Ht 64.0 in | Wt 173.4 lb

## 2012-12-11 DIAGNOSIS — K5289 Other specified noninfective gastroenteritis and colitis: Secondary | ICD-10-CM

## 2012-12-11 MED ORDER — PREDNISONE 10 MG PO TABS: ORAL_TABLET | ORAL | Status: DC

## 2012-12-11 NOTE — Progress Notes (Signed)
Primary Care Physician: [name], MD  Primary Gastroenterologist:  Garfield Cornea, MD   Chief Complaint  Patient presents with  . Diarrhea    HPI: Beth Hamilton is a 57 y.o. female here for flare of of indeterminate colitis. Roughly one week ago she noted that she was more gassy than usual. This was followed by abdominal cramping, mucous in the stool. Her stools became more loose she saw some pink tinged blood. Generally she has 1-2 bowel movements daily consisting of solid stool. Now she is just having a few loose stools daily. She had left over prednisone at home which she started herself on last weekend. Initially she started at 20 mg daily but on Tuesday she increased to 30 mg daily because of persistent symptoms. Denies fever, vomiting. Her last flare was in 2010 per her report. She is due for a complete colonoscopy at this time. Her last one was in 2003. When she was diagnosed with inflammatory bowel disease this was via incomplete colonoscopy in 2007/flexible sigmoidoscopy in 2007. `  Current Outpatient Prescriptions  Medication Sig Dispense Refill  . acyclovir (ZOVIRAX) 400 MG tablet TAKE (2) TABLETS BY MOUTH TWICE DAILY.  20 tablet  3  . mesalamine (LIALDA) 1.2 G EC tablet Take 4 tablets (4.8 g total) by mouth daily.  120 tablet  11  . ARMOUR THYROID 30 MG tablet Take 30 mg by mouth daily.        No current facility-administered medications for this visit.    Allergies as of 12/11/2012  . (No Known Allergies)   Past Medical History  Diagnosis Date  . IBD (inflammatory bowel disease)   . Hemorrhoids    Past Surgical History  Procedure Laterality Date  . Tubal ligation    . Wisdom tooth extraction    . Colonoscopy  10/09/2001    RMR: Internal hemorrhoids and anal papilla; otherwise normal rectum  . Colonoscopy  12/10/2005    incomplete tcs-  colitis  . Flexible sigmoidoscopy  02/26/2006    endoscopically normal-appearing rectum, colitis in sigmoid mucosa     ROS:  General: Negative for anorexia, weight loss, fever, chills, fatigue, weakness. ENT: Negative for hoarseness, difficulty swallowing , nasal congestion. CV: Negative for chest pain, angina, palpitations, dyspnea on exertion, peripheral edema.  Respiratory: Negative for dyspnea at rest, dyspnea on exertion, cough, sputum, wheezing.  GI: See history of present illness. GU:  Negative for dysuria, hematuria, urinary incontinence, urinary frequency, nocturnal urination.  Endo: Negative for unusual weight change.    Physical Examination:   Pulse 82  Temp(Src) 98.2 F (36.8 C) (Oral)  Ht 5' 4"  (1.626 m)  Wt 173 lb 6.4 oz (78.654 kg)  BMI 29.75 kg/m2  General: Well-nourished, well-developed in no acute distress.  Eyes: No icterus. Mouth: Oropharyngeal mucosa moist and pink , no lesions erythema or exudate. Lungs: Clear to auscultation bilaterally.  Heart: Regular rate and rhythm, no murmurs rubs or gallops.  Abdomen: Bowel sounds are normal, nontender, nondistended, no hepatosplenomegaly or masses, no abdominal bruits or hernia , no rebound or guarding.   Extremities: No lower extremity edema. No clubbing or deformities. Neuro: Alert and oriented x 4   Skin: Warm and dry, no jaundice.   Psych: Alert and cooperative, normal mood and affect.

## 2012-12-11 NOTE — Assessment & Plan Note (Signed)
Indeterminate colitis, flair. Overall she has been doing remarkably well since she was diagnosed in 2007. Her last flare was in 2010. She self medicated with prednisone she had at home. She is feeling better at this point but not quite turned the corner. Increase to 40 mg daily through the weekend, then begin prednisone taper as outlined. She wants to have her colonoscopy this summer. If she is asymptomatic at that, she can be triaged. She will call when she is ready.  Continue Lialda.

## 2012-12-11 NOTE — Patient Instructions (Addendum)
1. Prednisone 40 mg daily for 5 days. Then taper every 5 days to 30 mg, 20 mg, 10 mg, 5 mg, off. 2. Please call to schedule colonoscopy this summer.

## 2012-12-15 NOTE — Progress Notes (Signed)
Forwarded to PCP.

## 2013-01-05 ENCOUNTER — Encounter: Payer: Self-pay | Admitting: Internal Medicine

## 2013-01-07 ENCOUNTER — Other Ambulatory Visit: Payer: Self-pay | Admitting: Gastroenterology

## 2013-01-07 NOTE — Telephone Encounter (Signed)
Please find out if patient has completed prednisone course. I don't think she should need new RX.

## 2013-01-11 NOTE — Telephone Encounter (Signed)
LMOM to call back

## 2013-02-03 ENCOUNTER — Other Ambulatory Visit: Payer: Self-pay | Admitting: Obstetrics & Gynecology

## 2013-02-03 DIAGNOSIS — Z139 Encounter for screening, unspecified: Secondary | ICD-10-CM

## 2013-03-15 ENCOUNTER — Ambulatory Visit (HOSPITAL_COMMUNITY): Payer: BC Managed Care – PPO

## 2013-03-16 ENCOUNTER — Ambulatory Visit (HOSPITAL_COMMUNITY)
Admission: RE | Admit: 2013-03-16 | Discharge: 2013-03-16 | Disposition: A | Discharge status: Home / Self Care | Payer: BC Managed Care – PPO | Source: Ambulatory Visit | Attending: Obstetrics & Gynecology | Admitting: Obstetrics & Gynecology

## 2013-03-16 DIAGNOSIS — Z139 Encounter for screening, unspecified: Secondary | ICD-10-CM

## 2013-03-16 DIAGNOSIS — Z1231 Encounter for screening mammogram for malignant neoplasm of breast: Secondary | ICD-10-CM | POA: Insufficient documentation

## 2013-04-22 ENCOUNTER — Ambulatory Visit (INDEPENDENT_AMBULATORY_CARE_PROVIDER_SITE_OTHER): Payer: BC Managed Care – PPO | Admitting: Obstetrics & Gynecology

## 2013-04-22 ENCOUNTER — Encounter: Payer: Self-pay | Admitting: Obstetrics & Gynecology

## 2013-04-22 ENCOUNTER — Other Ambulatory Visit (HOSPITAL_COMMUNITY)
Admission: RE | Admit: 2013-04-22 | Discharge: 2013-04-22 | Disposition: A | Discharge status: Home / Self Care | Payer: BC Managed Care – PPO | Source: Ambulatory Visit | Attending: Obstetrics & Gynecology | Admitting: Obstetrics & Gynecology

## 2013-04-22 VITALS — BP 170/108 | Ht 63.5 in | Wt 179.5 lb

## 2013-04-22 DIAGNOSIS — Z01419 Encounter for gynecological examination (general) (routine) without abnormal findings: Secondary | ICD-10-CM

## 2013-04-22 DIAGNOSIS — Z1212 Encounter for screening for malignant neoplasm of rectum: Secondary | ICD-10-CM

## 2013-04-22 DIAGNOSIS — Z1151 Encounter for screening for human papillomavirus (HPV): Secondary | ICD-10-CM | POA: Insufficient documentation

## 2013-04-22 LAB — HEMOCCULT GUIAC POC 1CARD (OFFICE): Fecal Occult Blood, POC: NEGATIVE

## 2013-04-22 NOTE — Progress Notes (Signed)
Patient ID: Beth Hamilton, female   DOB: 09/22/1955, 57 y.o.   MRN: [id number] Subjective:     Beth Hamilton is a 57 y.o. female here for a routine exam.  No LMP recorded. Patient is postmenopausal. No obstetric history on file. Current complaints: none.    Gynecologic History No LMP recorded. Patient is postmenopausal. Contraception: post menopausal status Last Pap: 2013. Results were: normal Last mammogram: 01/2013. Results were: normal  Past Medical History  Diagnosis Date  . IBD (inflammatory bowel disease)   . Hemorrhoids   . Psoriasis     Past Surgical History  Procedure Laterality Date  . Tubal ligation    . Wisdom tooth extraction    . Colonoscopy  10/09/2001    RMR: Internal hemorrhoids and anal papilla; otherwise normal rectum  . Colonoscopy  12/10/2005    incomplete tcs-  colitis  . Flexible sigmoidoscopy  02/26/2006    endoscopically normal-appearing rectum, colitis in sigmoid mucosa    OB History   Grav Para Term Preterm Abortions TAB SAB Ect Mult Living                  History   Social History  . Marital Status: Married    Spouse Name: N/A    Number of Children: N/A  . Years of Education: N/A   Social History Main Topics  . Smoking status: Former Smoker    Types: Cigarettes  . Smokeless tobacco: Never Used  . Alcohol Use: Yes     Comment: wine every day  . Drug Use: No  . Sexual Activity: Yes    Birth Control/ Protection: Post-menopausal   Other Topics Concern  . None   Social History Narrative  . None    Family History  Problem Relation Age of Onset  . Colon cancer Neg Hx   . Stroke Maternal Grandfather      Review of Systems  Review of Systems  Constitutional: Negative for fever, chills, weight loss, malaise/fatigue and diaphoresis.  HENT: Negative for hearing loss, ear pain, nosebleeds, congestion, sore throat, neck pain, tinnitus and ear discharge.   Eyes: Negative for blurred vision, double vision, photophobia, pain,  discharge and redness.  Respiratory: Negative for cough, hemoptysis, sputum production, shortness of breath, wheezing and stridor.   Cardiovascular: Negative for chest pain, palpitations, orthopnea, claudication, leg swelling and PND.  Gastrointestinal: negative for abdominal pain. Negative for heartburn, nausea, vomiting, diarrhea, constipation, blood in stool and melena.  Genitourinary: Negative for dysuria, urgency, frequency, hematuria and flank pain.  Musculoskeletal: Negative for myalgias, back pain, joint pain and falls.  Skin: Negative for itching and rash.  Neurological: Negative for dizziness, tingling, tremors, sensory change, speech change, focal weakness, seizures, loss of consciousness, weakness and headaches.  Endo/Heme/Allergies: Negative for environmental allergies and polydipsia. Does not bruise/bleed easily.  Psychiatric/Behavioral: Negative for depression, suicidal ideas, hallucinations, memory loss and substance abuse. The patient is not nervous/anxious and does not have insomnia.        Objective:    Physical Exam  Vitals reviewed. Constitutional: She is oriented to person, place, and time. She appears well-developed and well-nourished.  HENT:  Head: Normocephalic and atraumatic.        Right Ear: External ear normal.  Left Ear: External ear normal.  Nose: Nose normal.  Mouth/Throat: Oropharynx is clear and moist.  Eyes: Conjunctivae and EOM are normal. Pupils are equal, round, and reactive to light. Right eye exhibits no discharge. Left eye exhibits no discharge. No scleral  icterus.  Neck: Normal range of motion. Neck supple. No tracheal deviation present. No thyromegaly present.  Cardiovascular: Normal rate, regular rhythm, normal heart sounds and intact distal pulses.  Exam reveals no gallop and no friction rub.   No murmur heard. Respiratory: Effort normal and breath sounds normal. No respiratory distress. She has no wheezes. She has no rales. She exhibits no  tenderness.  GI: Soft. Bowel sounds are normal. She exhibits no distension and no mass. There is no tenderness. There is no rebound and no guarding.  Genitourinary:  Breasts no masses skin changes or nipple changes bilaterally      Vulva is normal without lesions Vagina is pink moist without discharge Cervix normal in appearance and pap is done Uterus is normal size shape and contour Adnexa is negative with normal sized ovaries  Rectal    hemoccult negative, normal tone, no masses  Musculoskeletal: Normal range of motion. She exhibits no edema and no tenderness.  Neurological: She is alert and oriented to person, place, and time. She has normal reflexes. She displays normal reflexes. No cranial nerve deficit. She exhibits normal muscle tone. Coordination normal.  Skin: Skin is warm and dry. No rash noted. No erythema. No pallor.  Psychiatric: She has a normal mood and affect. Her behavior is normal. Judgment and thought content normal.       Assessment:    Healthy female exam.    Plan:    Mammogram ordered. Follow up in: 1 year.   Mammogram next July

## 2013-06-19 NOTE — Progress Notes (Signed)
REVIEWED.  

## 2013-07-23 ENCOUNTER — Encounter: Payer: Self-pay | Admitting: Family Medicine

## 2013-07-23 ENCOUNTER — Ambulatory Visit (INDEPENDENT_AMBULATORY_CARE_PROVIDER_SITE_OTHER): Payer: BC Managed Care – PPO | Admitting: Family Medicine

## 2013-07-23 VITALS — BP 130/90 | Temp 98.8°F | Ht 64.0 in | Wt 181.5 lb

## 2013-07-23 DIAGNOSIS — J019 Acute sinusitis, unspecified: Secondary | ICD-10-CM

## 2013-07-23 MED ORDER — AZITHROMYCIN 250 MG PO TABS: ORAL_TABLET | ORAL | Status: DC

## 2013-07-23 NOTE — Progress Notes (Signed)
   Subjective:    Patient ID: Beth Hamilton, female    DOB: 1955/12/09, 58 y.o.   MRN: [id number]  Cough This is a new problem. The current episode started in the past 7 days. The problem has been unchanged. The cough is productive of sputum. Associated symptoms include a fever and rhinorrhea. Pertinent negatives include no chest pain, ear pain, shortness of breath or wheezing. Nothing aggravates the symptoms. Treatments tried: motirn. The treatment provided mild relief.   Started 3 days ago Low fever yesterday with low energy No body aches  no wheeze  PMH benign Review of Systems  Constitutional: Positive for fever. Negative for activity change.  HENT: Positive for congestion and rhinorrhea. Negative for ear pain.   Eyes: Negative for discharge.  Respiratory: Positive for cough. Negative for shortness of breath and wheezing.   Cardiovascular: Negative for chest pain.       Objective:   Physical Exam  Nursing note and vitals reviewed. Constitutional: She appears well-developed.  HENT:  Head: Normocephalic.  Nose: Nose normal.  Mouth/Throat: Oropharynx is clear and moist. No oropharyngeal exudate.  Neck: Neck supple.  Cardiovascular: Normal rate and normal heart sounds.   No murmur heard. Pulmonary/Chest: Effort normal and breath sounds normal. She has no wheezes.  Lymphadenopathy:    She has no cervical adenopathy.  Skin: Skin is warm and dry.     Patient not toxic     Assessment & Plan:  Viral syndrome with secondary bronchitis/sinusitis antibiotics prescribed warning signs discussed

## 2013-07-26 ENCOUNTER — Ambulatory Visit (INDEPENDENT_AMBULATORY_CARE_PROVIDER_SITE_OTHER): Payer: BC Managed Care – PPO | Admitting: Family Medicine

## 2013-07-26 ENCOUNTER — Encounter: Payer: Self-pay | Admitting: Family Medicine

## 2013-07-26 VITALS — BP 134/98 | Temp 99.0°F | Ht 64.0 in | Wt 177.0 lb

## 2013-07-26 DIAGNOSIS — B349 Viral infection, unspecified: Secondary | ICD-10-CM

## 2013-07-26 DIAGNOSIS — J019 Acute sinusitis, unspecified: Secondary | ICD-10-CM

## 2013-07-26 DIAGNOSIS — B9789 Other viral agents as the cause of diseases classified elsewhere: Secondary | ICD-10-CM

## 2013-07-26 MED ORDER — CEFTRIAXONE SODIUM 1 G IJ SOLR
1.0000 g | Freq: Once | INTRAMUSCULAR | Status: AC
  Administered 2013-07-26: 1 g via INTRAMUSCULAR

## 2013-07-26 MED ORDER — CEFPROZIL 500 MG PO TABS: 500.0000 mg | ORAL_TABLET | Freq: Two times a day (BID) | ORAL | Status: DC

## 2013-07-26 NOTE — Progress Notes (Signed)
   Subjective:    Patient ID: Beth Hamilton, female    DOB: 1955-12-18, 58 y.o.   MRN: [id number]  Fever  This is a new problem. The current episode started in the past 7 days. Associated symptoms include congestion, coughing and headaches. Pertinent negatives include no chest pain, ear pain or wheezing. Treatments tried: zpack. The treatment provided no relief.   PMH benign   Review of Systems  Constitutional: Positive for fever. Negative for activity change.  HENT: Positive for congestion and rhinorrhea. Negative for ear pain.   Eyes: Negative for discharge.  Respiratory: Positive for cough. Negative for shortness of breath and wheezing.   Cardiovascular: Negative for chest pain.  Neurological: Positive for headaches.       Objective:   Physical Exam  Nursing note and vitals reviewed. Constitutional: She appears well-developed.  HENT:  Head: Normocephalic.  Nose: Nose normal.  Mouth/Throat: Oropharynx is clear and moist. No oropharyngeal exudate.  Neck: Neck supple.  Cardiovascular: Normal rate and normal heart sounds.   No murmur heard. Pulmonary/Chest: Effort normal and breath sounds normal. She has no wheezes.  Lymphadenopathy:    She has no cervical adenopathy.  Skin: Skin is warm and dry.          Assessment & Plan:  I suspect this patient might have had a mild case of the flu along with secondary sinusitis her lungs are clear I do not feel she needs lab work or x-rays but I do recommend shot of antibiotics along with antibiotics for his sinuses if progressive troubles or if worse to followup

## 2013-08-11 ENCOUNTER — Other Ambulatory Visit: Payer: Self-pay | Admitting: Gastroenterology

## 2013-09-09 ENCOUNTER — Telehealth: Payer: Self-pay | Admitting: *Deleted

## 2013-09-09 NOTE — Telephone Encounter (Signed)
Pt called saying she has a appointment 09/20/13 with [facility], pt wanted to know if she can get a RX or samples she needs enough to hold her over until her appointment to get a new RX for the year pt is taking lialda [phone] that is pt's work number she breaks from 12-1.

## 2013-09-09 NOTE — Telephone Encounter (Signed)
Samples at the front desk and pt is aware.

## 2013-09-20 ENCOUNTER — Encounter (INDEPENDENT_AMBULATORY_CARE_PROVIDER_SITE_OTHER): Payer: Self-pay

## 2013-09-20 ENCOUNTER — Encounter: Payer: Self-pay | Admitting: Gastroenterology

## 2013-09-20 ENCOUNTER — Ambulatory Visit (INDEPENDENT_AMBULATORY_CARE_PROVIDER_SITE_OTHER): Payer: BC Managed Care – PPO | Admitting: Gastroenterology

## 2013-09-20 VITALS — BP 140/98 | HR 83 | Temp 97.3°F | Ht 64.0 in | Wt 186.0 lb

## 2013-09-20 DIAGNOSIS — K5289 Other specified noninfective gastroenteritis and colitis: Secondary | ICD-10-CM

## 2013-09-20 MED ORDER — MESALAMINE 1.2 G PO TBEC: DELAYED_RELEASE_TABLET | ORAL | Status: DC

## 2013-09-20 NOTE — Assessment & Plan Note (Signed)
Indeterminate colitis. Doing well. She is overdue for colonoscopy. She is not prepared to schedule at this time. She'll call when she is ready, if asymptomatic, planned for triage. She is due for CMET at this time. OV in one year with Dr. Gala Romney.

## 2013-09-20 NOTE — Progress Notes (Signed)
Primary Care Physician: [name], MD  Primary Gastroenterologist:  Garfield Cornea, MD   Chief Complaint  Patient presents with  . Medication Refill    HPI: Beth Hamilton is a 58 y.o. female here for six month follow-up of indeterminate colitis. Maintained on Lialda four daily. Overdue for colonoscopy, last one back in 2003. Diagnosed with colitis via incomplete colonoscopy back in 2007.  BM 2 per day. Denies any flareups that she was last here. She feels well. No abdominal pain. No heartburn, vomiting. Her weight is up 13 pounds since her last visit.    Current Outpatient Prescriptions  Medication Sig Dispense Refill  . ARMOUR THYROID 30 MG tablet Take 30 mg by mouth daily.       . mesalamine (LIALDA) 1.2 G EC tablet TAKE 4 TABLETS BY MOUTH DAILY.  120 tablet  11   No current facility-administered medications for this visit.    Allergies as of 09/20/2013 - Review Complete 09/20/2013  Allergen Reaction Noted  . Levaquin [levofloxacin in d5w]  07/23/2013   Past Medical History  Diagnosis Date  . IBD (inflammatory bowel disease)   . Hemorrhoids   . Psoriasis    Past Surgical History  Procedure Laterality Date  . Tubal ligation    . Wisdom tooth extraction    . Colonoscopy  10/09/2001    RMR: Internal hemorrhoids and anal papilla; otherwise normal rectum  . Colonoscopy  12/10/2005    incomplete tcs-  colitis  . Flexible sigmoidoscopy  02/26/2006    endoscopically normal-appearing rectum, colitis in sigmoid mucosa   Family History  Problem Relation Age of Onset  . Colon cancer Neg Hx   . Stroke Maternal Grandfather    History   Social History  . Marital Status: Married    Spouse Name: N/A    Number of Children: N/A  . Years of Education: N/A   Social History Main Topics  . Smoking status: Former Smoker    Types: Cigarettes  . Smokeless tobacco: Former Systems developer    Quit date: 07/26/2005  . Alcohol Use: Yes     Comment: wine every day  . Drug Use: No    . Sexual Activity: Yes    Birth Control/ Protection: Post-menopausal   Other Topics Concern  . None   Social History Narrative  . None    ROS:  General: Negative for anorexia, weight loss, fever, chills, fatigue, weakness. ENT: Negative for hoarseness, difficulty swallowing , nasal congestion. CV: Negative for chest pain, angina, palpitations, dyspnea on exertion, peripheral edema.  Respiratory: Negative for dyspnea at rest, dyspnea on exertion, cough, sputum, wheezing.  GI: See history of present illness. GU:  Negative for dysuria, hematuria, urinary incontinence, urinary frequency, nocturnal urination.  Endo: Negative for unusual weight change.    Physical Examination:   BP 140/98  Pulse 83  Temp(Src) 97.3 F (36.3 C) (Oral)  Ht 5' 4"  (1.626 m)  Wt 186 lb (84.369 kg)  BMI 31.91 kg/m2  General: Well-nourished, well-developed in no acute distress.  Eyes: No icterus. Mouth: Oropharyngeal mucosa moist and pink , no lesions erythema or exudate. Lungs: Clear to auscultation bilaterally.  Heart: Regular rate and rhythm, no murmurs rubs or gallops.  Abdomen: Bowel sounds are normal, nontender, nondistended, no hepatosplenomegaly or masses, no abdominal bruits or hernia , no rebound or guarding.   Extremities: No lower extremity edema. No clubbing or deformities. Neuro: Alert and oriented x 4   Skin: Warm and dry, no jaundice.  Psych: Alert and cooperative, normal mood and affect.

## 2013-09-20 NOTE — Patient Instructions (Signed)
1. Continue Lialda as before. Prescription sent to your pharmacy. 2. Please have your labs done as scheduled. 3. You are overdue for a colonoscopy. Please call when you are ready to schedule. 4. Office visit in one year with Dr. Gala Romney.

## 2013-09-21 NOTE — Progress Notes (Signed)
cc'd to pcp 

## 2013-09-25 LAB — COMPREHENSIVE METABOLIC PANEL
ALK PHOS: 63 U/L (ref 39–117)
ALT: 30 U/L (ref 0–35)
AST: 23 U/L (ref 0–37)
Albumin: 4.2 g/dL (ref 3.5–5.2)
BUN: 13 mg/dL (ref 6–23)
CO2: 30 mEq/L (ref 19–32)
Calcium: 8.9 mg/dL (ref 8.4–10.5)
Chloride: 105 mEq/L (ref 96–112)
Creat: 0.75 mg/dL (ref 0.50–1.10)
Glucose, Bld: 91 mg/dL (ref 70–99)
POTASSIUM: 4.7 meq/L (ref 3.5–5.3)
Sodium: 143 mEq/L (ref 135–145)
Total Bilirubin: 1 mg/dL (ref 0.2–1.2)
Total Protein: 6.6 g/dL (ref 6.0–8.3)

## 2013-09-29 NOTE — Progress Notes (Signed)
Quick Note:  Please let patient know her labs look good. Liver/kidney function normal. ______

## 2013-10-06 ENCOUNTER — Encounter: Payer: Self-pay | Admitting: Family Medicine

## 2013-10-06 ENCOUNTER — Ambulatory Visit (INDEPENDENT_AMBULATORY_CARE_PROVIDER_SITE_OTHER): Payer: BC Managed Care – PPO | Admitting: Family Medicine

## 2013-10-06 VITALS — BP 150/100 | Temp 99.1°F | Ht 64.0 in | Wt 181.0 lb

## 2013-10-06 DIAGNOSIS — R030 Elevated blood-pressure reading, without diagnosis of hypertension: Secondary | ICD-10-CM

## 2013-10-06 DIAGNOSIS — J019 Acute sinusitis, unspecified: Secondary | ICD-10-CM

## 2013-10-06 DIAGNOSIS — J209 Acute bronchitis, unspecified: Secondary | ICD-10-CM

## 2013-10-06 DIAGNOSIS — IMO0001 Reserved for inherently not codable concepts without codable children: Secondary | ICD-10-CM

## 2013-10-06 MED ORDER — CEFPROZIL 500 MG PO TABS: 500.0000 mg | ORAL_TABLET | Freq: Two times a day (BID) | ORAL | Status: DC

## 2013-10-06 NOTE — Patient Instructions (Addendum)

## 2013-10-06 NOTE — Progress Notes (Signed)
   Subjective:    Patient ID: Beth Hamilton, female    DOB: 1955-12-10, 58 y.o.   MRN: [id number]  Cough This is a new problem. The current episode started in the past 7 days. The problem has been unchanged. The problem occurs constantly. The cough is productive of sputum. Associated symptoms include a fever. Pertinent negatives include no chest pain, ear pain, rhinorrhea, sore throat, shortness of breath or wheezing. Nothing aggravates the symptoms. Treatments tried: aleve. The treatment provided no relief.   Patient states she has been having high blood pressure readings recently. PMH benign  Review of Systems  Constitutional: Positive for fever. Negative for activity change.  HENT: Negative for congestion, ear pain, rhinorrhea and sore throat.   Eyes: Negative for discharge.  Respiratory: Positive for cough. Negative for shortness of breath and wheezing.   Cardiovascular: Negative for chest pain.       Objective:   Physical Exam Lungs clear hearts regular pulse normal blood pressure elevated in both left and right arm moderate congested cough noted       Assessment & Plan:  Acute sinusitis Acute bronchitis Elevated blood pressure follow specific diet given today exercise followup again in 4-6 weeks if blood pressures still elevated then start medication

## 2013-10-29 ENCOUNTER — Encounter: Payer: Self-pay | Admitting: *Deleted

## 2013-11-03 ENCOUNTER — Ambulatory Visit (INDEPENDENT_AMBULATORY_CARE_PROVIDER_SITE_OTHER): Payer: BC Managed Care – PPO | Admitting: Family Medicine

## 2013-11-03 ENCOUNTER — Encounter: Payer: Self-pay | Admitting: Family Medicine

## 2013-11-03 VITALS — BP 152/90 | Ht 64.0 in | Wt 181.0 lb

## 2013-11-03 DIAGNOSIS — I10 Essential (primary) hypertension: Secondary | ICD-10-CM

## 2013-11-03 MED ORDER — LISINOPRIL 5 MG PO TABS: 5.0000 mg | ORAL_TABLET | Freq: Every day | ORAL | Status: DC

## 2013-11-03 NOTE — Progress Notes (Signed)
   Subjective:    Patient ID: Beth Hamilton, female    DOB: [birth year], 58 y.o.   MRN: [id number]  HPIRecheck on blood pressure. Patient brought in blood pressure monitor and her readings.  She relates she is watching diet try to exercise try to bring her weight down having difficult time doing so she understands the importance of regular activity and low salt   Review of Systems Patient denies any chest tightness pressure pain shortness breath nausea vomiting diarrhea    Objective:   Physical Exam Lungs are clear hearts regular pulse normal extremities no edema       Assessment & Plan:  DASH diet information given HTN exercise watching diet taking medication the importance of this discussed Options were discussed including diuretics and ACE inhibitors. Lisinopril 5 mg half tablet a day for the first week then one tablet daily followup 3 months. Send [place] blood pressure readings within the next few weeks it should be noted that we checked her blood pressure with our machine and with first inch hers runs about 15 points higher both diastolic and systolic and then our manual readings.

## 2013-11-03 NOTE — Patient Instructions (Signed)
DASH Diet  The DASH diet stands for "Dietary Approaches to Stop Hypertension." It is a healthy eating plan that has been shown to reduce high blood pressure (hypertension) in as little as 14 days, while also possibly providing other significant health benefits. These other health benefits include reducing the risk of breast cancer after menopause and reducing the risk of type 2 diabetes, heart disease, colon cancer, and stroke. Health benefits also include weight loss and slowing kidney failure in patients with chronic kidney disease.   DIET GUIDELINES  · Limit salt (sodium). Your diet should contain less than 1500 mg of sodium daily.  · Limit refined or processed carbohydrates. Your diet should include mostly whole grains. Desserts and added sugars should be used sparingly.  · Include small amounts of heart-healthy fats. These types of fats include nuts, oils, and tub margarine. Limit saturated and trans fats. These fats have been shown to be harmful in the body.  CHOOSING FOODS   The following food groups are based on a 2000 calorie diet. See your Registered Dietitian for individual calorie needs.  Grains and Grain Products (6 to 8 servings daily)  · Eat More Often: Whole-wheat bread, brown rice, whole-grain or wheat pasta, quinoa, popcorn without added fat or salt (air popped).  · Eat Less Often: White bread, white pasta, white rice, cornbread.  Vegetables (4 to 5 servings daily)  · Eat More Often: Fresh, frozen, and canned vegetables. Vegetables may be raw, steamed, roasted, or grilled with a minimal amount of fat.  · Eat Less Often/Avoid: Creamed or fried vegetables. Vegetables in a cheese sauce.  Fruit (4 to 5 servings daily)  · Eat More Often: All fresh, canned (in natural juice), or frozen fruits. Dried fruits without added sugar. One hundred percent fruit juice (½ cup [237 mL] daily).  · Eat Less Often: Dried fruits with added sugar. Canned fruit in light or heavy syrup.  Lean Meats, Fish, and Poultry (2  servings or less daily. One serving is 3 to 4 oz [85-114 g]).  · Eat More Often: Ninety percent or leaner ground beef, tenderloin, sirloin. Round cuts of beef, chicken breast, turkey breast. All fish. Grill, bake, or broil your meat. Nothing should be fried.  · Eat Less Often/Avoid: Fatty cuts of meat, turkey, or chicken leg, thigh, or wing. Fried cuts of meat or fish.  Dairy (2 to 3 servings)  · Eat More Often: Low-fat or fat-free milk, low-fat plain or light yogurt, reduced-fat or part-skim cheese.  · Eat Less Often/Avoid: Milk (whole, 2%). Whole milk yogurt. Full-fat cheeses.  Nuts, Seeds, and Legumes (4 to 5 servings per week)  · Eat More Often: All without added salt.  · Eat Less Often/Avoid: Salted nuts and seeds, canned beans with added salt.  Fats and Sweets (limited)  · Eat More Often: Vegetable oils, tub margarines without trans fats, sugar-free gelatin. Mayonnaise and salad dressings.  · Eat Less Often/Avoid: Coconut oils, palm oils, butter, stick margarine, cream, half and half, cookies, candy, pie.  FOR MORE INFORMATION  The Dash Diet Eating Plan: www.dashdiet.org  Document Released: 06/27/2011 Document Revised: 09/30/2011 Document Reviewed: 06/27/2011  ExitCare® Patient Information ©2014 [employer].

## 2013-11-20 LAB — BASIC METABOLIC PANEL
BUN: 13 mg/dL (ref 6–23)
CALCIUM: 9.5 mg/dL (ref 8.4–10.5)
CO2: 28 mEq/L (ref 19–32)
CREATININE: 0.69 mg/dL (ref 0.50–1.10)
Chloride: 103 mEq/L (ref 96–112)
Glucose, Bld: 95 mg/dL (ref 70–99)
Potassium: 4.3 mEq/L (ref 3.5–5.3)
Sodium: 138 mEq/L (ref 135–145)

## 2013-11-26 ENCOUNTER — Telehealth: Payer: Self-pay | Admitting: Internal Medicine

## 2013-11-26 NOTE — Telephone Encounter (Signed)
Patient needs to be triaged per [facility] last OV for TCS w/RMR, give her a call  At work [phone]

## 2013-11-29 NOTE — Telephone Encounter (Signed)
Pt called and said she does not want her colonoscopy til August and she wants it on AUGUST 31. She said this will work for her work schedule.  I told her I will do my best if Dr. Gala Romney is at the hospital that day. She will call me back in July to check on this.  I also have her on my recall list.

## 2013-12-03 ENCOUNTER — Telehealth: Payer: Self-pay

## 2013-12-03 MED ORDER — BUDESONIDE 9 MG PO TB24
9.0000 mg | ORAL_TABLET | Freq: Every day | ORAL | Status: DC
Start: 2013-12-03 — End: 2013-12-23

## 2013-12-03 NOTE — Telephone Encounter (Signed)
I called and talked to [facility]. He said the card would work but they do not have it in [place] so her is going to call the [facility] to see if they have it. I will call him back before I leave and the patient is aware also.

## 2013-12-03 NOTE — Telephone Encounter (Signed)
The pharmacy will not have any Uceris until Monday. I am going to give her a couple of samples to get her stared for the weekend. The saving card did work for her.

## 2013-12-03 NOTE — Telephone Encounter (Signed)
Spoke to patient. She has had some loose mucous stools with abd discomfort over past few days. Some blood tinged mucous. No fever. No recent abx.  Recommend Uceris 14m daily for 8 weeks.  OV as scheduled.   Please check with pharmacy to see if medication is covered and patient cost! If more than $50, we will do prednisone.

## 2013-12-03 NOTE — Telephone Encounter (Signed)
Pt is calling this morning because she is having a flare up with her Colitis. She would like to know if we could call her something in for it. She has an appointment on June 17 @ 830 with [facility] but she needs some medication before then. She goes to [facility]. Her call back number is [phone].

## 2013-12-03 NOTE — Telephone Encounter (Signed)
Great!

## 2013-12-03 NOTE — Telephone Encounter (Signed)
See if rebate works for her.

## 2013-12-03 NOTE — Telephone Encounter (Signed)
Her co-pay will be $200.00 for the Uceris. Please advise

## 2013-12-22 ENCOUNTER — Telehealth: Payer: Self-pay | Admitting: *Deleted

## 2013-12-22 DIAGNOSIS — R197 Diarrhea, unspecified: Secondary | ICD-10-CM

## 2013-12-22 NOTE — Telephone Encounter (Signed)
Pt called stating the uceris 46m is not helping, pt said it has clamed her down a little bit she still has mucus, pt has a BM like normal, pt said her cramps are not as bad. Pt said she needs something else. Please advise [phone] is pt's work number, pt said she can not wait until her appointment to change this medication

## 2013-12-22 NOTE — Telephone Encounter (Signed)
Spoke with pt- she is taking the uceris and the Lialda and she is still having frequent (6-7 times a day) soft bm's with mucous and her stomach is cramping. No fever, no blood. She has an appointment on 01-05-14 but she doesn't think she can take it that long. She wants to know if she can go ahead and switch to another medication.

## 2013-12-23 ENCOUNTER — Other Ambulatory Visit: Payer: Self-pay

## 2013-12-23 DIAGNOSIS — R197 Diarrhea, unspecified: Secondary | ICD-10-CM

## 2013-12-23 MED ORDER — PREDNISONE 10 MG PO TABS: ORAL_TABLET | ORAL | Status: DC

## 2013-12-23 NOTE — Telephone Encounter (Signed)
She has been on Uceris for 3 weeks. I would suspect she feel better by now.  Let's stop Uceris. Send stool for C.Diff PCR to rule out underlying infectious. Prednisone 46m RX with taper.

## 2013-12-23 NOTE — Telephone Encounter (Signed)
Pt is aware. Pt requesting cdiff order mailed to her. Lab order done and mailed.

## 2013-12-29 LAB — CLOSTRIDIUM DIFFICILE BY PCR: CDIFFPCR: NOT DETECTED

## 2013-12-29 NOTE — Progress Notes (Signed)
Quick Note:  Please let pt know her C.Diff was negative! Continue prednisone. ______

## 2014-01-05 ENCOUNTER — Encounter (INDEPENDENT_AMBULATORY_CARE_PROVIDER_SITE_OTHER): Payer: Self-pay

## 2014-01-05 ENCOUNTER — Encounter: Payer: Self-pay | Admitting: Gastroenterology

## 2014-01-05 ENCOUNTER — Ambulatory Visit (INDEPENDENT_AMBULATORY_CARE_PROVIDER_SITE_OTHER): Payer: BC Managed Care – PPO | Admitting: Gastroenterology

## 2014-01-05 VITALS — BP 180/90 | HR 84 | Temp 97.1°F | Resp 18 | Ht 64.0 in | Wt 182.0 lb

## 2014-01-05 DIAGNOSIS — K5289 Other specified noninfective gastroenteritis and colitis: Secondary | ICD-10-CM

## 2014-01-05 NOTE — Progress Notes (Signed)
Primary Care Physician: [name], MD  Primary Gastroenterologist:  Garfield Cornea, MD   Chief Complaint  Patient presents with  . Follow-up    HPI: Beth Hamilton is a 58 y.o. female here for f/u of recent IBD flare. Symptoms began in mid-May. She was on Uceris for about two weeks but no noted improvement. Helped cramping some. Switched to prednisone. C.Diff PCR was negative. Currently on Prednisone 52m daily going down to 255mtomorrow. Very little blood in stool. Stools starting to get more formed. 80% better. No N/V. BP up some, recently started on lisinopril.    Current Outpatient Prescriptions  Medication Sig Dispense Refill  . ARMOUR THYROID 30 MG tablet Take 30 mg by mouth daily.       . Marland Kitchenisinopril (PRINIVIL,ZESTRIL) 5 MG tablet Take 1 tablet (5 mg total) by mouth daily.  30 tablet  5  . mesalamine (LIALDA) 1.2 G EC tablet TAKE 4 TABLETS BY MOUTH DAILY.  120 tablet  11  . predniSONE (DELTASONE) 10 MG tablet 4077mrally daily for 7 days. 94m71mily for 7 days. 20mg2mly for 7 days. 10mg 50my for 7 days, then stop.  100 tablet  0   No current facility-administered medications for this visit.    Allergies as of 01/05/2014 - Review Complete 01/05/2014  Allergen Reaction Noted  . Flagyl [metronidazole]  10/29/2013  . Levaquin [levofloxacin in d5w]  07/23/2013   Past Medical History  Diagnosis Date  . IBD (inflammatory bowel disease)   . Hemorrhoids   . Psoriasis   . Thyroid disease    Past Surgical History  Procedure Laterality Date  . Tubal ligation    . Wisdom tooth extraction    . Colonoscopy  10/09/2001    RMR: Internal hemorrhoids and anal papilla; otherwise normal rectum  . Colonoscopy  12/10/2005    incomplete tcs-  colitis  . Flexible sigmoidoscopy  02/26/2006    endoscopically normal-appearing rectum, colitis in sigmoid mucosa   Family History  Problem Relation Age of Onset  . Colon cancer Neg Hx   . Stroke Maternal Grandfather   . Diabetes  Other   . Heart disease Other    History   Social History  . Marital Status: Married    Spouse Name: N/A    Number of Children: N/A  . Years of Education: N/A   Social History Main Topics  . Smoking status: Former Smoker    Types: Cigarettes  . Smokeless tobacco: Former User  Systems developerit date: 07/26/2005  . Alcohol Use: Yes     Comment: wine every day  . Drug Use: No  . Sexual Activity: Yes    Birth Control/ Protection: Post-menopausal   Other Topics Concern  . None   Social History Narrative  . None    ROS:  General: Negative for anorexia, weight loss, fever, chills, fatigue, weakness. ENT: Negative for hoarseness, difficulty swallowing , nasal congestion. CV: Negative for chest pain, angina, palpitations, dyspnea on exertion, peripheral edema.  Respiratory: Negative for dyspnea at rest, dyspnea on exertion, cough, sputum, wheezing.  GI: See history of present illness. GU:  Negative for dysuria, hematuria, urinary incontinence, urinary frequency, nocturnal urination.  Endo: Negative for unusual weight change.    Physical Examination:   BP 180/90  Pulse 84  Temp(Src) 97.1 F (36.2 C) (Oral)  Resp 18  Ht 5' 4"  (1.626 m)  Wt 182 lb (82.555 kg)  BMI 31.22 kg/m2  General: Well-nourished, well-developed in  no acute distress.  Eyes: No icterus. Mouth: Oropharyngeal mucosa moist and pink , no lesions erythema or exudate. Lungs: Clear to auscultation bilaterally.  Heart: Regular rate and rhythm, no murmurs rubs or gallops.  Abdomen: Bowel sounds are normal, nontender, nondistended, no hepatosplenomegaly or masses, no abdominal bruits or hernia , no rebound or guarding.   Extremities: No lower extremity edema. No clubbing or deformities. Neuro: Alert and oriented x 4   Skin: Warm and dry, no jaundice.   Psych: Alert and cooperative, normal mood and affect.  Labs:  Lab Results  Component Value Date   CREATININE 0.69 11/20/2013   BUN 13 11/20/2013   NA 138 11/20/2013   K  4.3 11/20/2013   CL 103 11/20/2013   CO2 28 11/20/2013    Imaging Studies: No results found.

## 2014-01-05 NOTE — Patient Instructions (Signed)
1. Please monitor your blood pressure at home and contact PCP if continues to be elevated. 2. Continue prednisone taper. 3. Colonoscopy in August.

## 2014-01-05 NOTE — Progress Notes (Signed)
cc'd to pcp 

## 2014-01-05 NOTE — Assessment & Plan Note (Signed)
Indeterminate colitis. Recent flare, unknown reason. C.Diff PCR negative. Switched from Uceris to prednisone and notes 80% improvement in symptoms at this time. Continues on Lialda. She wants to plan for colonoscopy towards the end of 02/2014 for surveillance.  I have discussed the risks, alternatives, benefits with regards to but not limited to the risk of reaction to medication, bleeding, infection, perforation and the patient is agreeable to proceed. Written consent to be obtained.  She will call sooner if she does not gain 100% remission. Continue prednisone taper by 68m per week but may go from 10 to 548mat the end if needed.

## 2014-01-13 ENCOUNTER — Telehealth: Payer: Self-pay | Admitting: *Deleted

## 2014-01-13 NOTE — Telephone Encounter (Signed)
Pt called stating Magda Paganini has her on prednisone and  Magda Paganini told her she could slowly work her way to 1 pill, she is taking the in half's pt said she does not have enough pills to do it that was she only has 7 left. Pt would like to know if she can get a RX. Please advise [phone] see other phone notes.

## 2014-01-14 MED ORDER — PREDNISONE 10 MG PO TABS: ORAL_TABLET | ORAL | Status: DC

## 2014-01-14 NOTE — Telephone Encounter (Signed)
done

## 2014-01-14 NOTE — Telephone Encounter (Signed)
Routing to LSL 

## 2014-02-02 ENCOUNTER — Ambulatory Visit (INDEPENDENT_AMBULATORY_CARE_PROVIDER_SITE_OTHER): Payer: BC Managed Care – PPO | Admitting: Family Medicine

## 2014-02-02 ENCOUNTER — Encounter: Payer: Self-pay | Admitting: Family Medicine

## 2014-02-02 VITALS — BP 148/88 | Ht 64.0 in | Wt 181.4 lb

## 2014-02-02 DIAGNOSIS — I10 Essential (primary) hypertension: Secondary | ICD-10-CM

## 2014-02-02 MED ORDER — LISINOPRIL 10 MG PO TABS: 10.0000 mg | ORAL_TABLET | Freq: Every day | ORAL | Status: DC

## 2014-02-02 NOTE — Progress Notes (Signed)
   Subjective:    Patient ID: Beth Hamilton, female    DOB: 1956/01/30, 58 y.o.   MRN: [id number]  Hypertension This is a chronic problem. The current episode started more than 1 year ago. Pertinent negatives include no chest pain or shortness of breath. Agents associated with hypertension include thyroid hormones. Risk factors for coronary artery disease include post-menopausal state. Treatments tried: lisinopril. There are no compliance problems.    Patient has not taken blood pressure meds yet.   Review of Systems  Constitutional: Negative for activity change, appetite change and fatigue.  Respiratory: Negative for cough and shortness of breath.   Cardiovascular: Negative for chest pain.  Gastrointestinal: Negative for abdominal pain.  Endocrine: Negative for polydipsia and polyphagia.  Genitourinary: Negative for frequency.  Neurological: Negative for weakness.  Psychiatric/Behavioral: Negative for confusion.       Objective:   Physical Exam  Vitals reviewed. Constitutional: She appears well-nourished. No distress.  Cardiovascular: Normal rate, regular rhythm and normal heart sounds.   No murmur heard. Pulmonary/Chest: Effort normal and breath sounds normal. No respiratory distress.  Musculoskeletal: She exhibits no edema.  Lymphadenopathy:    She has no cervical adenopathy.  Neurological: She is alert. She exhibits normal muscle tone.  Psychiatric: Her behavior is normal.          Assessment & Plan:  HTN-need to increase medication to 10 mg daily. Followup according to do she starts having significant problems otherwise followup again in a few months time she can check her blood pressure periodically. Watch diet closely. Check cholesterol.

## 2014-02-02 NOTE — Patient Instructions (Signed)
Hypertension Hypertension, commonly called high blood pressure, is when the force of blood pumping through your arteries is too strong. Your arteries are the blood vessels that carry blood from your heart throughout your body. A blood pressure reading consists of a higher number over a lower number, such as 110/72. The higher number (systolic) is the pressure inside your arteries when your heart pumps. The lower number (diastolic) is the pressure inside your arteries when your heart relaxes. Ideally you want your blood pressure below 120/80. Hypertension forces your heart to work harder to pump blood. Your arteries may become narrow or stiff. Having hypertension puts you at risk for heart disease, stroke, and other problems.  RISK FACTORS Some risk factors for high blood pressure are controllable. Others are not.  Risk factors you cannot control include:   Race. You may be at higher risk if you are African American.  Age. Risk increases with age.  Gender. Men are at higher risk than women before age 98 years. After age 74, women are at higher risk than men. Risk factors you can control include:  Not getting enough exercise or physical activity.  Being overweight.  Getting too much fat, sugar, calories, or salt in your diet.  Drinking too much alcohol. SIGNS AND SYMPTOMS Hypertension does not usually cause signs or symptoms. Extremely high blood pressure (hypertensive crisis) may cause headache, anxiety, shortness of breath, and nosebleed. DIAGNOSIS  To check if you have hypertension, your health care provider will measure your blood pressure while you are seated, with your arm held at the level of your heart. It should be measured at least twice using the same arm. Certain conditions can cause a difference in blood pressure between your right and left arms. A blood pressure reading that is higher than normal on one occasion does not mean that you need treatment. If one blood pressure reading  is high, ask your health care provider about having it checked again. TREATMENT  Treating high blood pressure includes making lifestyle changes and possibly taking medication. Living a healthy lifestyle can help lower high blood pressure. You may need to change some of your habits. Lifestyle changes may include:  Following the DASH diet. This diet is high in fruits, vegetables, and whole grains. It is low in salt, red meat, and added sugars.  Getting at least 2 1/2 hours of brisk physical activity every week.  Losing weight if necessary.  Not smoking.  Limiting alcoholic beverages.  Learning ways to reduce stress. If lifestyle changes are not enough to get your blood pressure under control, your health care provider may prescribe medicine. You may need to take more than one. Work closely with your health care provider to understand the risks and benefits. HOME CARE INSTRUCTIONS  Have your blood pressure rechecked as directed by your health care provider.   Only take medicine as directed by your health care provider. Follow the directions carefully. Blood pressure medicines must be taken as prescribed. The medicine does not work as well when you skip doses. Skipping doses also puts you at risk for problems.   Do not smoke.   Monitor your blood pressure at home as directed by your health care provider. SEEK MEDICAL CARE IF:   You think you are having a reaction to medicines taken.  You have recurrent headaches or feel dizzy.  You have swelling in your ankles.  You have trouble with your vision. SEEK IMMEDIATE MEDICAL CARE IF:  You develop a severe headache or  confusion.  You have unusual weakness, numbness, or feel faint.  You have severe chest or abdominal pain.  You vomit repeatedly.  You have trouble breathing. MAKE SURE YOU:   Understand these instructions.  Will watch your condition.  Will get help right away if you are not doing well or get  worse. Document Released: 07/08/2005 Document Revised: 07/13/2013 Document Reviewed: 04/30/2013 [facility] Patient Information 2015 [place], [place]. This information is not intended to replace advice given to you by your health care provider. Make sure you discuss any questions you have with your health care provider. DASH Eating Plan DASH stands for "Dietary Approaches to Stop Hypertension." The DASH eating plan is a healthy eating plan that has been shown to reduce high blood pressure (hypertension). Additional health benefits may include reducing the risk of type 2 diabetes mellitus, heart disease, and stroke. The DASH eating plan may also help with weight loss. WHAT DO I NEED TO KNOW ABOUT THE DASH EATING PLAN? For the DASH eating plan, you will follow these general guidelines:  Choose foods with a percent daily value for sodium of less than 5% (as listed on the food label).  Use salt-free seasonings or herbs instead of table salt or sea salt.  Check with your health care provider or pharmacist before using salt substitutes.  Eat lower-sodium products, often labeled as "lower sodium" or "no salt added."  Eat fresh foods.  Eat more vegetables, fruits, and low-fat dairy products.  Choose whole grains. Look for the word "whole" as the first word in the ingredient list.  Choose fish and skinless chicken or [place] more often than red meat. Limit fish, poultry, and meat to 6 oz (170 g) each day.  Limit sweets, desserts, sugars, and sugary drinks.  Choose heart-healthy fats.  Limit cheese to 1 oz (28 g) per day.  Eat more home-cooked food and less restaurant, buffet, and fast food.  Limit fried foods.  Cook foods using methods other than frying.  Limit canned vegetables. If you do use them, rinse them well to decrease the sodium.  When eating at a restaurant, ask that your food be prepared with less salt, or no salt if possible. WHAT FOODS CAN I EAT? Seek help from a dietitian for  individual calorie needs. Grains Whole grain or whole wheat bread. Brown rice. Whole grain or whole wheat pasta. Quinoa, bulgur, and whole grain cereals. Low-sodium cereals. Corn or whole wheat flour tortillas. Whole grain cornbread. Whole grain crackers. Low-sodium crackers. Vegetables Fresh or frozen vegetables (raw, steamed, roasted, or grilled). Low-sodium or reduced-sodium tomato and vegetable juices. Low-sodium or reduced-sodium tomato sauce and paste. Low-sodium or reduced-sodium canned vegetables.  Fruits All fresh, canned (in natural juice), or frozen fruits. Meat and Other Protein Products Ground beef (85% or leaner), grass-fed beef, or beef trimmed of fat. Skinless chicken or [place]. Ground chicken or [place]. Pork trimmed of fat. All fish and seafood. Eggs. Dried beans, peas, or lentils. Unsalted nuts and seeds. Unsalted canned beans. Dairy Low-fat dairy products, such as skim or 1% milk, 2% or reduced-fat cheeses, low-fat ricotta or cottage cheese, or plain low-fat yogurt. Low-sodium or reduced-sodium cheeses. Fats and Oils Tub margarines without trans fats. Light or reduced-fat mayonnaise and salad dressings (reduced sodium). Avocado. Safflower, olive, or canola oils. Natural peanut or almond butter. Other Unsalted popcorn and pretzels. The items listed above may not be a complete list of recommended foods or beverages. Contact your dietitian for more options. WHAT FOODS ARE NOT RECOMMENDED? Grains  White bread. White pasta. White rice. Refined cornbread. Bagels and croissants. Crackers that contain trans fat. Vegetables Creamed or fried vegetables. Vegetables in a cheese sauce. Regular canned vegetables. Regular canned tomato sauce and paste. Regular tomato and vegetable juices. Fruits Dried fruits. Canned fruit in light or heavy syrup. Fruit juice. Meat and Other Protein Products Fatty cuts of meat. Ribs, chicken wings, bacon, sausage, bologna, salami, chitterlings, fatback, hot  dogs, bratwurst, and packaged luncheon meats. Salted nuts and seeds. Canned beans with salt. Dairy Whole or 2% milk, cream, half-and-half, and cream cheese. Whole-fat or sweetened yogurt. Full-fat cheeses or blue cheese. Nondairy creamers and whipped toppings. Processed cheese, cheese spreads, or cheese curds. Condiments Onion and garlic salt, seasoned salt, table salt, and sea salt. Canned and packaged gravies. Worcestershire sauce. Tartar sauce. Barbecue sauce. Teriyaki sauce. Soy sauce, including reduced sodium. Steak sauce. Fish sauce. Oyster sauce. Cocktail sauce. Horseradish. Ketchup and mustard. Meat flavorings and tenderizers. Bouillon cubes. Hot sauce. Tabasco sauce. Marinades. Taco seasonings. Relishes. Fats and Oils Butter, stick margarine, lard, shortening, ghee, and bacon fat. Coconut, palm kernel, or palm oils. Regular salad dressings. Other Pickles and olives. Salted popcorn and pretzels. The items listed above may not be a complete list of foods and beverages to avoid. Contact your dietitian for more information. WHERE CAN I FIND MORE INFORMATION? National [employer], [employer], [employer] Institute: travelstabloid.com Document Released: 06/27/2011 Document Revised: 07/13/2013 Document Reviewed: 05/12/2013 [facility] Patient Information 2015 [place], [place]. This information is not intended to replace advice given to you by your health care provider. Make sure you discuss any questions you have with your health care provider.

## 2014-02-14 ENCOUNTER — Telehealth: Payer: Self-pay | Admitting: *Deleted

## 2014-02-14 NOTE — Telephone Encounter (Signed)
Pt called wanting to schedule her Colonoscopy for aug-sept. Please advise [phone].

## 2014-02-15 ENCOUNTER — Other Ambulatory Visit: Payer: Self-pay | Admitting: Obstetrics & Gynecology

## 2014-02-15 DIAGNOSIS — Z1231 Encounter for screening mammogram for malignant neoplasm of breast: Secondary | ICD-10-CM

## 2014-02-16 NOTE — Telephone Encounter (Signed)
I called pt and she will call me back tomorrow about her schedule.

## 2014-02-17 NOTE — Telephone Encounter (Signed)
Pt called back and left Vm that she cannot do the August 31 as first requested. She is requesting Sept 21 or Sept 28 whenever I get Dr.Rourk's schedule for Sept.

## 2014-02-17 NOTE — Telephone Encounter (Signed)
Pt called returning Beth Hamilton' call I made pt aware Beth Hamilton is with a patient, pt requested Beth Hamilton' VM.

## 2014-02-28 NOTE — Telephone Encounter (Signed)
Tried to call pt and no answer. I am putting her on for 04/18/2014 at 7:30 Am as she requested. I can triage her and them will just need to update prior to procedure.

## 2014-03-17 ENCOUNTER — Ambulatory Visit (HOSPITAL_COMMUNITY)
Admission: RE | Admit: 2014-03-17 | Discharge: 2014-03-17 | Disposition: A | Discharge status: Home / Self Care | Payer: BC Managed Care – PPO | Source: Ambulatory Visit | Attending: Obstetrics & Gynecology | Admitting: Obstetrics & Gynecology

## 2014-03-17 DIAGNOSIS — R928 Other abnormal and inconclusive findings on diagnostic imaging of breast: Secondary | ICD-10-CM | POA: Insufficient documentation

## 2014-03-17 DIAGNOSIS — Z1231 Encounter for screening mammogram for malignant neoplasm of breast: Secondary | ICD-10-CM | POA: Diagnosis present

## 2014-03-18 ENCOUNTER — Other Ambulatory Visit: Payer: Self-pay | Admitting: Obstetrics & Gynecology

## 2014-03-18 DIAGNOSIS — R928 Other abnormal and inconclusive findings on diagnostic imaging of breast: Secondary | ICD-10-CM

## 2014-03-21 ENCOUNTER — Other Ambulatory Visit: Payer: Self-pay

## 2014-03-21 ENCOUNTER — Telehealth: Payer: Self-pay

## 2014-03-21 DIAGNOSIS — Z1211 Encounter for screening for malignant neoplasm of colon: Secondary | ICD-10-CM

## 2014-03-21 NOTE — Telephone Encounter (Signed)
Gastroenterology Pre-Procedure Review  Request Date: 03/21/2014 Requesting Physician: Dr.Rourk   PATIENT REVIEW QUESTIONS: The patient responded to the following health history questions as indicated:    PT SAID SHE WAS ALLERGIC TO SOME MEDICATION USED DURING A PROCEDURE BY DR Patsey Berthold YEARS AGO. SHE DOES NOT KNOW THE NAME OF THE MEDICATION, BUT SAID THAT IT MADE HER WEAK ALL OVER.  1. Diabetes Melitis: no 2. Joint replacements in the past 12 months: no 3. Major health problems in the past 3 months: no 4. Has an artificial valve or MVP: no 5. Has a defibrillator: no 6. Has been advised in past to take antibiotics in advance of a procedure like teeth cleaning: no    MEDICATIONS & ALLERGIES:    Patient reports the following regarding taking any blood thinners:   Plavix? no Aspirin? no Coumadin? no  Patient confirms/reports the following medications:  Current Outpatient Prescriptions  Medication Sig Dispense Refill  . ARMOUR THYROID 30 MG tablet Take 30 mg by mouth daily.       Marland Kitchen lisinopril (PRINIVIL,ZESTRIL) 10 MG tablet Take 1 tablet (10 mg total) by mouth daily.  30 tablet  5  . mesalamine (LIALDA) 1.2 G EC tablet TAKE 4 TABLETS BY MOUTH DAILY.  120 tablet  11   No current facility-administered medications for this visit.    Patient confirms/reports the following allergies:  Allergies  Allergen Reactions  . Flagyl [Metronidazole]     Severe nausea and vomiting  . Levaquin [Levofloxacin In D5w]     Excessive abd pains and mucousy stool    No orders of the defined types were placed in this encounter.    AUTHORIZATION INFORMATION Primary Insurance:   ID #:  Group #:  Pre-Cert / Auth required:  Pre-Cert / Auth #:   Secondary Insurance:   ID #:   Group #:  Pre-Cert / Auth required:  Pre-Cert / Auth #:   SCHEDULE INFORMATION: Procedure has been scheduled as follows:  Date:  04/18/2014                  Time: 7:30 am  Location: [facility] Short Stay  This  Gastroenterology Pre-Precedure Review Form is being routed to the following provider(s): R. Garfield Cornea, MD

## 2014-03-22 ENCOUNTER — Other Ambulatory Visit: Payer: Self-pay | Admitting: Obstetrics & Gynecology

## 2014-03-22 DIAGNOSIS — R928 Other abnormal and inconclusive findings on diagnostic imaging of breast: Secondary | ICD-10-CM

## 2014-03-22 NOTE — Telephone Encounter (Signed)
See separate triage.

## 2014-03-22 NOTE — Telephone Encounter (Signed)
Received versed and demerol in 2012 without issues.  OK to schedule.

## 2014-03-29 MED ORDER — PEG-KCL-NACL-NASULF-NA ASC-C 100 G PO SOLR: 1.0000 | ORAL | Status: DC

## 2014-03-29 NOTE — Telephone Encounter (Signed)
Rx sent to the pharmacy and instructions mailed to pt.

## 2014-03-30 ENCOUNTER — Encounter (HOSPITAL_COMMUNITY): Payer: Self-pay | Admitting: Pharmacy Technician

## 2014-04-01 ENCOUNTER — Other Ambulatory Visit: Payer: Self-pay | Admitting: Obstetrics & Gynecology

## 2014-04-01 DIAGNOSIS — R928 Other abnormal and inconclusive findings on diagnostic imaging of breast: Secondary | ICD-10-CM

## 2014-04-05 ENCOUNTER — Other Ambulatory Visit: Payer: Self-pay | Admitting: Obstetrics & Gynecology

## 2014-04-05 ENCOUNTER — Ambulatory Visit
Admission: RE | Admit: 2014-04-05 | Discharge: 2014-04-05 | Disposition: A | Discharge status: Home / Self Care | Payer: BC Managed Care – PPO | Source: Ambulatory Visit | Attending: Obstetrics & Gynecology | Admitting: Obstetrics & Gynecology

## 2014-04-05 DIAGNOSIS — R928 Other abnormal and inconclusive findings on diagnostic imaging of breast: Secondary | ICD-10-CM

## 2014-04-06 ENCOUNTER — Telehealth: Payer: Self-pay | Admitting: *Deleted

## 2014-04-06 NOTE — Telephone Encounter (Signed)
Leigh from the [facility] states patient was seen there yesterday for a breast biopsy,  results were positive for breast cancer. Does the [facility] need to contact patient or would Dr. Elonda Husky prefer to contact patient with these results. Results are in [place] per Leigh. Per Dr. Elonda Husky would like for the [facility] to contact patient, he refers patients to Dr. Arnoldo Morale in [place], [place].

## 2014-04-06 NOTE — Telephone Encounter (Signed)
Leigh from [facility], states will contact pt with biopsy results and refer patient to Dr. Arnoldo Morale.

## 2014-04-14 ENCOUNTER — Other Ambulatory Visit (HOSPITAL_COMMUNITY): Payer: Self-pay | Admitting: General Surgery

## 2014-04-14 ENCOUNTER — Encounter (HOSPITAL_COMMUNITY): Payer: Self-pay | Admitting: Pharmacy Technician

## 2014-04-14 DIAGNOSIS — C50912 Malignant neoplasm of unspecified site of left female breast: Secondary | ICD-10-CM

## 2014-04-15 ENCOUNTER — Encounter (HOSPITAL_COMMUNITY): Payer: Self-pay

## 2014-04-15 ENCOUNTER — Ambulatory Visit (HOSPITAL_COMMUNITY)
Admission: RE | Admit: 2014-04-15 | Discharge: 2014-04-15 | Disposition: A | Discharge status: Home / Self Care | Payer: BC Managed Care – PPO | Source: Ambulatory Visit | Attending: General Surgery | Admitting: General Surgery

## 2014-04-15 ENCOUNTER — Encounter (HOSPITAL_COMMUNITY)
Admission: RE | Admit: 2014-04-15 | Discharge: 2014-04-15 | Disposition: A | Discharge status: Home / Self Care | Payer: BC Managed Care – PPO | Source: Ambulatory Visit | Attending: General Surgery | Admitting: General Surgery

## 2014-04-15 ENCOUNTER — Telehealth: Payer: Self-pay

## 2014-04-15 DIAGNOSIS — I10 Essential (primary) hypertension: Secondary | ICD-10-CM | POA: Diagnosis not present

## 2014-04-15 DIAGNOSIS — C50919 Malignant neoplasm of unspecified site of unspecified female breast: Secondary | ICD-10-CM | POA: Diagnosis not present

## 2014-04-15 DIAGNOSIS — Z87891 Personal history of nicotine dependence: Secondary | ICD-10-CM | POA: Diagnosis not present

## 2014-04-15 DIAGNOSIS — K5190 Ulcerative colitis, unspecified, without complications: Secondary | ICD-10-CM | POA: Diagnosis not present

## 2014-04-15 DIAGNOSIS — Z01818 Encounter for other preprocedural examination: Secondary | ICD-10-CM | POA: Diagnosis present

## 2014-04-15 DIAGNOSIS — Z881 Allergy status to other antibiotic agents status: Secondary | ICD-10-CM | POA: Diagnosis not present

## 2014-04-15 DIAGNOSIS — E039 Hypothyroidism, unspecified: Secondary | ICD-10-CM | POA: Diagnosis not present

## 2014-04-15 HISTORY — DX: Adverse effect of unspecified anesthetic, initial encounter: T41.45XA

## 2014-04-15 HISTORY — DX: Hypothyroidism, unspecified: E03.9

## 2014-04-15 HISTORY — DX: Other complications of anesthesia, initial encounter: T88.59XA

## 2014-04-15 HISTORY — DX: Essential (primary) hypertension: I10

## 2014-04-15 LAB — CBC WITH DIFFERENTIAL/PLATELET
Basophils Absolute: 0 10*3/uL (ref 0.0–0.1)
Basophils Relative: 0 % (ref 0–1)
EOS ABS: 0.1 10*3/uL (ref 0.0–0.7)
Eosinophils Relative: 1 % (ref 0–5)
HCT: 43.5 % (ref 36.0–46.0)
Hemoglobin: 14.3 g/dL (ref 12.0–15.0)
LYMPHS ABS: 3 10*3/uL (ref 0.7–4.0)
Lymphocytes Relative: 39 % (ref 12–46)
MCH: 33.9 pg (ref 26.0–34.0)
MCHC: 32.9 g/dL (ref 30.0–36.0)
MCV: 103.1 fL — ABNORMAL HIGH (ref 78.0–100.0)
MONOS PCT: 7 % (ref 3–12)
Monocytes Absolute: 0.6 10*3/uL (ref 0.1–1.0)
NEUTROS PCT: 53 % (ref 43–77)
Neutro Abs: 4 10*3/uL (ref 1.7–7.7)
Platelets: 234 10*3/uL (ref 150–400)
RBC: 4.22 MIL/uL (ref 3.87–5.11)
RDW: 12.4 % (ref 11.5–15.5)
WBC: 7.6 10*3/uL (ref 4.0–10.5)

## 2014-04-15 LAB — COMPREHENSIVE METABOLIC PANEL
ALK PHOS: 68 U/L (ref 39–117)
ALT: 36 U/L — ABNORMAL HIGH (ref 0–35)
ANION GAP: 13 (ref 5–15)
AST: 28 U/L (ref 0–37)
Albumin: 4 g/dL (ref 3.5–5.2)
BILIRUBIN TOTAL: 0.9 mg/dL (ref 0.3–1.2)
BUN: 13 mg/dL (ref 6–23)
CHLORIDE: 103 meq/L (ref 96–112)
CO2: 26 mEq/L (ref 19–32)
Calcium: 9.7 mg/dL (ref 8.4–10.5)
Creatinine, Ser: 0.68 mg/dL (ref 0.50–1.10)
GFR calc Af Amer: >90 mL/min (ref >90)
GLUCOSE: 118 mg/dL — AB (ref 70–99)
POTASSIUM: 4.1 meq/L (ref 3.7–5.3)
Sodium: 142 mEq/L (ref 137–147)
TOTAL PROTEIN: 7.4 g/dL (ref 6.0–8.3)

## 2014-04-15 NOTE — Patient Instructions (Signed)
B

## 2014-04-15 NOTE — H&P (Signed)
  NTS SOAP Note  Vital Signs:  Vitals as of: 1/94/1740: Systolic 814: Diastolic 481: Heart Rate 99: Temp 21F: Height 33f 4in: Weight 180Lbs 0 Ounces: BMI 30.9  BMI : 30.9 kg/m2  Subjective: This 58year old female presents for of a left breast cancer.  Found on routine mammography.  Biopsy positive for invasive duct carcinoma,  754min size.  Does not feel a lump.  No family h/o breast carcinoma,  no nipple discharge.  Review of Symptoms:  Constitutional:unremarkable   Head:unremarkable Eyes:unremarkable   Nose/Mouth/Throat:unremarkable Cardiovascular:  unremarkable Respiratory:unremarkable Gastrointestinal:  unremarkable   Genitourinary:unremarkable   Musculoskeletal:unremarkable Skin:unremarkable Hematolgic/Lymphatic:unremarkable   Allergic/Immunologic:unremarkable   Past Medical History:  Reviewed  Past Medical History  Surgical History: none Medical Problems: HTN,  hypothyroidism Allergies: nkda Medications: lialda,  a BP pill,  synthroid   Social History:Reviewed  Social History  Preferred Language: English Race:  White Ethnicity: Not Hispanic / Latino Age: [id number] Marital Status:  S Alcohol: 2 glasses of red wine a day   Smoking Status: Never smoker reviewed on 04/14/2014 Functional Status reviewed on 04/14/2014 ------------------------------------------------ Bathing: Normal Cooking: Normal Dressing: Normal Driving: Normal Eating: Normal Managing Meds: Normal Oral Care: Normal Shopping: Normal Toileting: Normal Transferring: Normal Walking: Normal Cognitive Status reviewed on 04/14/2014 ------------------------------------------------ Attention: Normal Decision Making: Normal Language: Normal Memory: Normal Motor: Normal Perception: Normal Problem Solving: Normal Visual and Spatial: Normal   Family History:Reviewed  Family Health History Mother, Deceased; Healthy;  Father, Deceased; Healthy;     Objective  Information: General:Well appearing, well nourished in no distress. Neck:Supple without lymphadenopathy.  Heart:RRR, no murmur or gallop.  Normal S1, S2.  No S3, S4.  Lungs:  CTA bilaterally, no wheezes, rhonchi, rales.  Breathing unlabored. No dominant mass,  nipple discharge,  dimpling.  Axilla negative for palpable nodes.  Assessment:Left breast carcinoma,  clinical stage 1  Diagnoses: 174.9  C50.912 Primary malignant neoplasm of female breast (Malignant neoplasm of unspecified site of left female breast)  Procedures: [id number] OFFICE OUTPATIENT NEW 45 MINUTES    Plan:  Discussed all surgical options including modified radical mastectomy vs partial mastectomy/sentinel lymph node biopsy/XRT.  Patient has elected to procedure with partial mastectomy after needle localization/sentinel lymph node biopsy/XRT.  Scheduled for surgery on 04/20/14.   Patient Education:Alternative treatments to surgery were discussed with patient (and family).  Risks and benefits  of procedure bleeding,  infection,  nerve injury,  and swelling were fully explained to the patient (and family) who gave informed consent. Patient/family questions were addressed.  Follow-up:Pending Surgery

## 2014-04-15 NOTE — Telephone Encounter (Signed)
I called [employer] at [phone] and spoke to Presley Raddle who said that a PA is not required for a screening colonoscopy.

## 2014-04-15 NOTE — Pre-Procedure Instructions (Signed)
Patient given information to sign up for my chart at home. 

## 2014-04-15 NOTE — Patient Instructions (Signed)
NYHLA MOUNTJOY  04/15/2014   Your procedure is scheduled on:   9/30/ 2015  Report to [facility] at  10  AM.  Call this number if you have problems the morning of surgery: [phone]   Remember:   Do not eat food or drink liquids after midnight.   Take these medicines the morning of surgery with A SIP OF WATER:  Armor thyroid, lisinopril, lialda   Do not wear jewelry, make-up or nail polish.  Do not wear lotions, powders, or perfumes.   Do not shave 48 hours prior to surgery. Men may shave face and neck.  Do not bring valuables to the hospital.  [facility] is not responsible for any belongings or valuables.               Contacts, dentures or bridgework may not be worn into surgery.  Leave suitcase in the car. After surgery it may be brought to your room.  For patients admitted to the hospital, discharge time is determined by your treatment team.               Patients discharged the day of surgery will not be allowed to drive home.  Name and phone number of your driver: family  Special Instructions: Shower using CHG 2 nights before surgery and the night before surgery.  If you shower the day of surgery use CHG.  Use special wash - you have one bottle of CHG for all showers.  You should use approximately 1/3 of the bottle for each shower.   Please read over the following fact sheets that you were given: Pain Booklet, Coughing and Deep Breathing, Surgical Site Infection Prevention, Anesthesia Post-op Instructions and Care and Recovery After Surgery Breast Biopsy A breast biopsy is a procedure where a sample of breast tissue is removed from your breast. The tissue is examined under a microscope to see if cancerous cells are present. A breast biopsy is done when there is:  Any undiagnosed breast mass (tumor).  Nipple abnormalities, dimpling, crusting, or ulcerations.  Abnormal discharge from the nipple, especially blood.  Redness, swelling, and pain of the  breast.  Calcium deposits (calcifications) or abnormalities seen on a mammogram, ultrasound result, or results of magnetic resonance imaging (MRI).  Suspicious changes in the breast seen on your mammogram. If the tumor is found to be cancerous (malignant), a breast biopsy can help to determine what the best treatment is for you. There are many different types of breast biopsies. Talk to your caregiver about your options and which type is best for you. LET YOUR CAREGIVER KNOW ABOUT:  Allergies to food or medicine.  Medicines taken, including vitamins, herbs, eyedrops, over-the-counter medicines, and creams.  Use of steroids (by mouth or creams).  Previous problems with anesthetics or numbing medicines.  History of bleeding problems or blood clots.  Previous surgery.  Other health problems, including diabetes and kidney problems.  Any recent colds or infections.  Possibility of pregnancy, if this applies. RISKS AND COMPLICATIONS   Bleeding.  Infection.  Allergy to medicines.  Bruising and swelling of the breast.  Alteration in the shape of the breast.  Not finding the lump or abnormality.  Needing more surgery. BEFORE THE PROCEDURE  Arrange for someone to drive you home after the procedure.  Do not smoke for 2 weeks before the procedure. Stop smoking, if you smoke.  Do not drink alcohol for 24 hours before procedure.  Wear a  good support bra to the procedure. PROCEDURE  You may be given a medicine to numb the breast area (local anesthesia) or a medicine to make you sleep (general anesthesia) during the procedure. The following are the different types of biopsies that can be performed.   Fine-needle aspiration--A thin needle is attached to a syringe and inserted into the breast lump. Fluid and cells are removed and then looked at under a microscope. If the breast lump cannot be felt, an ultrasound may be used to help locate the lump and place the needle in the correct  area.   Core needle biopsy--A wide, hollow needle (core needle) is inserted into the breast lump 3-6 times to get tissue samples or cores. The samples are removed. The needle is usually placed in the correct area by using an ultrasound or X-ray.   Stereotactic biopsy--X-ray equipment and a computer are used to analyze X-ray pictures of the breast lump. The computer then finds exactly where the core needle needs to be inserted. Tissue samples are removed.   Vacuum-assisted biopsy--A small incision (less than  inch) is made in your breast. A biopsy device that includes a hollow needle and vacuum is passed through the incision and into the breast tissue. The vacuum gently draws abnormal breast tissue into the needle to remove it. This type of biopsy removes a larger tissue sample than a regular core needle biopsy. No stitches are needed, and there is usually little scarring.  Ultrasound-guided core needle biopsy--A high frequency ultrasound helps guide the core needle to the area of the mass or abnormality. An incision is made to insert the needle. Tissue samples are removed.  Open biopsy--A larger incision is made in the breast. Your caregiver will attempt to remove the whole breast lump or as much as possible. AFTER THE PROCEDURE  You will be taken to the recovery area. If you are doing well and have no problems, you will be allowed to go home.  You may notice bruising on your breast. This is normal.  Your caregiver may apply a pressure dressing on your breast for 24-48 hours. A pressure dressing is a bandage that is wrapped tightly around the chest to stop fluid from collecting underneath tissues. Document Released: 07/08/2005 Document Revised: 11/02/2012 Document Reviewed: 08/08/2011 [facility] Patient Information 2015 [place], [place]. This information is not intended to replace advice given to you by your health care provider. Make sure you discuss any questions you have with your health care  provider. Sentinel Lymph Node Biopsy Sentinel lymph node biopsy is a procedure in which a single lymph node is identified, removed, and examined for cancer. Lymph nodes are collections of tissue that help filter infections, cancer cells, and other waste substances from the bloodstream. Certain types of cancer can spread to nearby lymph nodes. The cancer spreads to one lymph node first, and then to others. The first lymph node that your cancer could spread to is called the sentinel lymph node. Examining the sentinel lymph node for cancer can help your caregiver plan future treatment for you. LET YOUR CAREGIVER KNOW ABOUT:   Allergies to food or medicine.  Medicines taken, including vitamins, herbs, eyedrops, over-the-counter medicines, and creams.  Use of steroids (by mouth or creams).  Previous problems with numbing medicines.  History of bleeding problems or blood clots.  Previous surgery.  Other health problems, including diabetes and kidney problems.  Possibility of pregnancy, if this applies. RISKS AND COMPLICATIONS   Infection.  Bleeding.  Allergic reaction to  the dye used for the procedure.  Blue staining of the skin where the dye is injected.  Damaged lymph vessels, causing a buildup of fluid (lymphedema).  Pain or bruising at the biopsy site. BEFORE THE PROCEDURE   Stop smoking at least 2 weeks before the procedure. Not smoking will improve your health after the procedure and decrease the chance of getting a wound infection.  You may have blood tests to make sure your blood clots normally.  Ask your caregiver about changing or stopping your regular medicines.  Do not eat or drink anything for 8 hours before the procedure. PROCEDURE   You will be given medicine that makes you sleep (general anesthetic).  A blue, radioactive dye will be injected near the tumor. The dye will then spread into the sentinel lymph node.  A scanner will identify the sentinel lymph  node.  A small cut (incision) will be made, and the sentinel lymph node will be removed.  The sentinel lymph node will be examined in a lab. Sometimes, a sentinel lymph node biopsy is performed during another surgery, such as a mastectomy or lumpectomy for breast cancer.  AFTER THE PROCEDURE   You will go to a recovery room.  You will be monitored for several hours.  If complications do not occur, you will be allowed to go home a few hours after the procedure.  Your urine may be blue for the next 24 hours. This is normal. It is caused by the dye used during the procedure.  Your skin where the dye was injected may be blue for up to 8 weeks. Document Released: 09/30/2011 Document Reviewed: 08/27/2013 [facility] Patient Information 2015 [place]. This information is not intended to replace advice given to you by your health care provider. Make sure you discuss any questions you have with your health care provider. PATIENT INSTRUCTIONS POST-ANESTHESIA  IMMEDIATELY FOLLOWING SURGERY:  Do not drive or operate machinery for the first twenty four hours after surgery.  Do not make any important decisions for twenty four hours after surgery or while taking narcotic pain medications or sedatives.  If you develop intractable nausea and vomiting or a severe headache please notify your doctor immediately.  FOLLOW-UP:  Please make an appointment with your surgeon as instructed. You do not need to follow up with anesthesia unless specifically instructed to do so.  WOUND CARE INSTRUCTIONS (if applicable):  Keep a dry clean dressing on the anesthesia/puncture wound site if there is drainage.  Once the wound has quit draining you may leave it open to air.  Generally you should leave the bandage intact for twenty four hours unless there is drainage.  If the epidural site drains for more than 36-48 hours please call the anesthesia department.  QUESTIONS?:  Please feel free to call your physician or the  hospital operator if you have any questions, and they will be happy to assist you.

## 2014-04-18 ENCOUNTER — Encounter (HOSPITAL_COMMUNITY)
Admission: RE | Disposition: A | Discharge status: Home / Self Care | Payer: Self-pay | Source: Ambulatory Visit | Attending: Internal Medicine

## 2014-04-18 ENCOUNTER — Ambulatory Visit (HOSPITAL_COMMUNITY)
Admission: RE | Admit: 2014-04-18 | Discharge: 2014-04-18 | Disposition: A | Discharge status: Home / Self Care | Payer: BC Managed Care – PPO | Source: Ambulatory Visit | Attending: Internal Medicine | Admitting: Internal Medicine

## 2014-04-18 ENCOUNTER — Encounter (HOSPITAL_COMMUNITY): Payer: Self-pay | Admitting: *Deleted

## 2014-04-18 DIAGNOSIS — Z881 Allergy status to other antibiotic agents status: Secondary | ICD-10-CM | POA: Insufficient documentation

## 2014-04-18 DIAGNOSIS — Z1211 Encounter for screening for malignant neoplasm of colon: Secondary | ICD-10-CM

## 2014-04-18 DIAGNOSIS — Z86010 Personal history of colonic polyps: Secondary | ICD-10-CM

## 2014-04-18 DIAGNOSIS — E039 Hypothyroidism, unspecified: Secondary | ICD-10-CM | POA: Insufficient documentation

## 2014-04-18 DIAGNOSIS — K5190 Ulcerative colitis, unspecified, without complications: Secondary | ICD-10-CM

## 2014-04-18 DIAGNOSIS — L408 Other psoriasis: Secondary | ICD-10-CM | POA: Diagnosis not present

## 2014-04-18 DIAGNOSIS — D126 Benign neoplasm of colon, unspecified: Secondary | ICD-10-CM | POA: Diagnosis not present

## 2014-04-18 DIAGNOSIS — I10 Essential (primary) hypertension: Secondary | ICD-10-CM | POA: Diagnosis not present

## 2014-04-18 HISTORY — PX: COLONOSCOPY: SHX5424

## 2014-04-18 SURGERY — COLONOSCOPY
Anesthesia: Moderate Sedation

## 2014-04-18 MED ORDER — MEPERIDINE HCL 100 MG/ML IJ SOLN
INTRAMUSCULAR | Status: AC
  Filled 2014-04-18: qty 2

## 2014-04-18 MED ORDER — ONDANSETRON HCL 4 MG/2ML IJ SOLN
INTRAMUSCULAR | Status: DC | PRN
  Administered 2014-04-18: 4 mg via INTRAVENOUS

## 2014-04-18 MED ORDER — MIDAZOLAM HCL 5 MG/5ML IJ SOLN
INTRAMUSCULAR | Status: DC | PRN
  Administered 2014-04-18 (×2): 1 mg via INTRAVENOUS
  Administered 2014-04-18 (×2): 2 mg via INTRAVENOUS

## 2014-04-18 MED ORDER — STERILE WATER FOR IRRIGATION IR SOLN
Status: DC | PRN
  Administered 2014-04-18: 08:00:00

## 2014-04-18 MED ORDER — ONDANSETRON HCL 4 MG/2ML IJ SOLN
INTRAMUSCULAR | Status: AC
  Filled 2014-04-18: qty 2

## 2014-04-18 MED ORDER — MIDAZOLAM HCL 5 MG/5ML IJ SOLN
INTRAMUSCULAR | Status: AC
  Filled 2014-04-18: qty 10

## 2014-04-18 MED ORDER — SODIUM CHLORIDE 0.9 % IV SOLN
INTRAVENOUS | Status: DC
  Administered 2014-04-18: 1000 mL via INTRAVENOUS

## 2014-04-18 MED ORDER — MEPERIDINE HCL 100 MG/ML IJ SOLN
INTRAMUSCULAR | Status: DC | PRN
  Administered 2014-04-18 (×2): 50 mg via INTRAVENOUS

## 2014-04-18 NOTE — Discharge Instructions (Addendum)

## 2014-04-18 NOTE — Op Note (Signed)
[facility] [address] [place], [address]   COLONOSCOPY PROCEDURE REPORT  PATIENT: Beth, Hamilton  MR#: [id number] BIRTHDATE: 01/05/1956 , 35  yrs. old GENDER: female ENDOSCOPIST: R.  Garfield Cornea, MD FACP [facility] REFERRED KA:JGOTL Wolfgang Phoenix, M.D. PROCEDURE DATE:  04/18/2014 PROCEDURE:   Colonoscopy with biopsy and Colonoscopy with snare polypectomy INDICATIONS:high risk previously diagnosed Inflammatory bowel disease, non-specific. MEDICATIONS: Versed 6 mg IV and Demerol 100 mg IV in divided doses. Zofran 4 mg IV. ASA CLASS:       Class II  CONSENT: The risks, benefits, alternatives and imponderables including but not limited to bleeding, perforation as well as the possibility of a missed lesion have been reviewed.  The potential for biopsy, lesion removal, etc. have also been discussed. Questions have been answered.  All parties agreeable.  Please see the history and physical in the medical record for more information.  DESCRIPTION OF PROCEDURE:   After the risks benefits and alternatives of the procedure were thoroughly explained, informed consent was obtained.  The digital rectal exam revealed no abnormalities of the rectum.   The EC-[id number] ([id number])  endoscope was introduced through the anus and advanced to the terminal ileum which was intubated for a short distance. No adverse events experienced.   The quality of the prep was adequate.  The instrument was then slowly withdrawn as the colon was fully examined.      COLON FINDINGS: Abnormal rectum and left colon into the mid descending segment.  Mucosa was erythematous as well as pale with some loss of normal vascular pattern.  No erosions or ulcers. Patient had some smooth pedunculated 3-4 mm polyps in the sigmoid segment most consistent with pseudopolyps.  These abnormalities tapered off into the proximal descending segment.  The colonic mucosa appeared normal otherwise from this level all the way  to the cecum.  Very distal terminal ileum was seen and appeared normal. .  Segmental biopsies of the colon taken for histologic study. Polyp in the descending and sigmoid segments were cold snared and cold biopsied.  Withdrawal time=20 minutes 0 seconds.  The scope was withdrawn and the procedure completed. COMPLICATIONS: There were no complications.  ENDOSCOPIC IMPRESSION: Abnormal rectum and left colon into the mid descending segment. Mucosa was erythematous as well as pale with some loss of normal vascular pattern.  No erosions or ulcers.  Patient had some smooth pedunculated 3-4 mm polyps in the sigmoid segment most consistent with pseudopolyps.  These abnormalities tapered off into the proximal descending segment.  The colonic mucosa appeared normal otherwise from this level all the way to the cecum.  Very distal terminal ileum was seen and appeared normal   Status post snare polypectomy and biopsy as described above.  RECOMMENDATIONS: Await biopsy results  Continue Lialda 4.8 g daily. Creatinine normal on recent laboratory assay.  eSigned:  R. Garfield Cornea, MD FACP [facility] 04/18/2014 8:39 AM   cc:   PATIENT NAME:  Beth, Hamilton MR#: [id number]

## 2014-04-18 NOTE — H&P (Signed)
@LOGO @   Primary Care Physician:  [name], MD Primary Gastroenterologist:  Dr. Gala Romney  Pre-Procedure History & Physical: HPI:  Beth Hamilton is a 58 y.o. female here for for screening colonoscopy. 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 No bowel symptoms currently.  Last complete colonoscopy about 12 years ago.  Past Medical History  Diagnosis Date  . IBD (inflammatory bowel disease)   . Hemorrhoids   . Psoriasis   . Thyroid disease   . Ulcerative colitis 5 1 2007    dr Sydell Axon  . Complication of anesthesia     not sure what med was given in 96 with D&C, was very sore all over. Dr Patsey Berthold did anesthesia. Dr Heide Spark did surgery  . Hypertension   . Hypothyroidism     Past Surgical History  Procedure Laterality Date  . Wisdom tooth extraction    . Colonoscopy  10/09/2001    RMR: Internal hemorrhoids and anal papilla; otherwise normal rectum  . Colonoscopy  12/10/2005    incomplete tcs-  colitis  . Flexible sigmoidoscopy  02/26/2006    endoscopically normal-appearing rectum, colitis in sigmoid mucosa  . Tubal ligation    . Dilation and curettage of uterus  1996    Dr Heide Spark    Prior to Admission medications   Medication Sig Start Date End Date Taking? Authorizing Provider  ARMOUR THYROID 30 MG tablet Take 30 mg by mouth daily.  07/11/12  Yes Historical Provider, MD  lisinopril (PRINIVIL,ZESTRIL) 10 MG tablet Take 1 tablet (10 mg total) by mouth daily. 02/02/14  Yes [name], MD  mesalamine (LIALDA) 1.2 G EC tablet TAKE 4 TABLETS BY MOUTH DAILY. 09/20/13  Yes Mahala Menghini, PA-C    Allergies as of 03/21/2014 - Review Complete 03/21/2014  Allergen Reaction Noted  . Flagyl [metronidazole]  10/29/2013  . Levaquin [levofloxacin in d5w]  07/23/2013    Family History  Problem Relation Age of Onset  . Colon cancer Neg Hx   . Stroke Maternal Grandfather   . Diabetes Other   . Heart disease Other      History   Social History  . Marital Status: Married    Spouse Name: N/A    Number of Children: N/A  . Years of Education: N/A   Occupational History  . Not on file.   Social History Main Topics  . Smoking status: Former Smoker -- 0.50 packs/day for 15 years    Types: Cigarettes  . Smokeless tobacco: Former Systems developer    Quit date: 07/26/2005  . Alcohol Use: Yes     Comment: wine every day  . Drug Use: No  . Sexual Activity: Yes    Birth Control/ Protection: Post-menopausal   Other Topics Concern  . Not on file   Social History Narrative  . No narrative on file    Review of Systems: See HPI, otherwise negative ROS  Physical Exam: BP 164/107  Temp(Src) 98.4 F (36.9 C) (Oral)  Resp 14  Ht 5' 4"  (1.626 m)  Wt 175 lb (79.379 kg)  BMI 30.02 kg/m2  SpO2 98% General:   Alert,  Well-developed, well-nourished, pleasant and cooperative in NAD Skin:  Intact without significant lesions or rashes. Eyes:  Sclera clear, no icterus.   Conjunctiva pink. Ears:  Normal auditory acuity. Nose:  No deformity, discharge,  or lesions. Mouth:  No deformity or lesions. Neck:  Supple; no masses or thyromegaly. No significant cervical  adenopathy. Lungs:  Clear throughout to auscultation.   No wheezes, crackles, or rhonchi. No acute distress. Heart:  Regular rate and rhythm; no murmurs, clicks, rubs,  or gallops. Abdomen: Non-distended, normal bowel sounds.  Soft and nontender without appreciable mass or hepatosplenomegaly.  Pulses:  Normal pulses noted. Extremities:  Without clubbing or edema.  Impression/Plan: 58 year old lady with a history of indeterminate colitis now clinically, in remission. Patient due for screening/surveillance colonoscopy. Notice: This dictation was prepared with Dragon dictation along with smaller phrase technology. Any transcriptional errors that result from this process are unintentional and may not be corrected upon review.

## 2014-04-20 ENCOUNTER — Ambulatory Visit (HOSPITAL_COMMUNITY)
Admission: RE | Admit: 2014-04-20 | Discharge: 2014-04-20 | Disposition: A | Discharge status: Home / Self Care | Payer: BC Managed Care – PPO | Source: Ambulatory Visit | Attending: General Surgery | Admitting: General Surgery

## 2014-04-20 ENCOUNTER — Encounter (HOSPITAL_COMMUNITY)
Admission: RE | Disposition: A | Discharge status: Home / Self Care | Payer: Self-pay | Source: Ambulatory Visit | Attending: General Surgery

## 2014-04-20 ENCOUNTER — Encounter (HOSPITAL_COMMUNITY)
Admission: RE | Admit: 2014-04-20 | Discharge: 2014-04-20 | Disposition: A | Discharge status: Home / Self Care | Payer: BC Managed Care – PPO | Source: Ambulatory Visit | Attending: General Surgery | Admitting: General Surgery

## 2014-04-20 ENCOUNTER — Encounter: Payer: Self-pay | Admitting: Internal Medicine

## 2014-04-20 ENCOUNTER — Encounter (HOSPITAL_COMMUNITY): Payer: BC Managed Care – PPO | Admitting: Anesthesiology

## 2014-04-20 ENCOUNTER — Encounter (HOSPITAL_COMMUNITY): Payer: Self-pay | Admitting: Certified Registered Nurse Anesthetist

## 2014-04-20 ENCOUNTER — Encounter (HOSPITAL_COMMUNITY): Payer: BC Managed Care – PPO

## 2014-04-20 ENCOUNTER — Ambulatory Visit (HOSPITAL_COMMUNITY): Payer: BC Managed Care – PPO | Admitting: Anesthesiology

## 2014-04-20 ENCOUNTER — Encounter (HOSPITAL_COMMUNITY): Payer: Self-pay | Admitting: *Deleted

## 2014-04-20 ENCOUNTER — Ambulatory Visit (HOSPITAL_COMMUNITY): Payer: BC Managed Care – PPO

## 2014-04-20 ENCOUNTER — Encounter (HOSPITAL_COMMUNITY): Payer: Self-pay

## 2014-04-20 DIAGNOSIS — C50919 Malignant neoplasm of unspecified site of unspecified female breast: Secondary | ICD-10-CM | POA: Diagnosis not present

## 2014-04-20 DIAGNOSIS — Z881 Allergy status to other antibiotic agents status: Secondary | ICD-10-CM | POA: Insufficient documentation

## 2014-04-20 DIAGNOSIS — E039 Hypothyroidism, unspecified: Secondary | ICD-10-CM | POA: Insufficient documentation

## 2014-04-20 DIAGNOSIS — C50912 Malignant neoplasm of unspecified site of left female breast: Secondary | ICD-10-CM

## 2014-04-20 DIAGNOSIS — Z87891 Personal history of nicotine dependence: Secondary | ICD-10-CM | POA: Insufficient documentation

## 2014-04-20 DIAGNOSIS — I10 Essential (primary) hypertension: Secondary | ICD-10-CM | POA: Insufficient documentation

## 2014-04-20 HISTORY — DX: Malignant neoplasm of unspecified site of unspecified female breast: C50.919

## 2014-04-20 HISTORY — PX: BREAST LUMPECTOMY: SHX2

## 2014-04-20 HISTORY — PX: PARTIAL MASTECTOMY WITH NEEDLE LOCALIZATION AND AXILLARY SENTINEL LYMPH NODE BX: SHX6009

## 2014-04-20 SURGERY — PARTIAL MASTECTOMY WITH NEEDLE LOCALIZATION AND AXILLARY SENTINEL LYMPH NODE BX
Anesthesia: General | Site: Breast | Laterality: Left

## 2014-04-20 MED ORDER — DEXAMETHASONE SODIUM PHOSPHATE 4 MG/ML IJ SOLN
INTRAMUSCULAR | Status: AC
  Filled 2014-04-20: qty 1

## 2014-04-20 MED ORDER — ONDANSETRON HCL 4 MG/2ML IJ SOLN
4.0000 mg | Freq: Once | INTRAMUSCULAR | Status: AC
  Administered 2014-04-20: 4 mg via INTRAVENOUS

## 2014-04-20 MED ORDER — CEFAZOLIN SODIUM 1-5 GM-% IV SOLN
INTRAVENOUS | Status: DC | PRN
  Administered 2014-04-20: 2 g via INTRAVENOUS

## 2014-04-20 MED ORDER — GLYCOPYRROLATE 0.2 MG/ML IJ SOLN
INTRAMUSCULAR | Status: AC
  Filled 2014-04-20: qty 2

## 2014-04-20 MED ORDER — MIDAZOLAM HCL 2 MG/2ML IJ SOLN
INTRAMUSCULAR | Status: AC
  Filled 2014-04-20: qty 2

## 2014-04-20 MED ORDER — MIDAZOLAM HCL 2 MG/2ML IJ SOLN
1.0000 mg | INTRAMUSCULAR | Status: DC | PRN
  Administered 2014-04-20 (×2): 2 mg via INTRAVENOUS

## 2014-04-20 MED ORDER — BUPIVACAINE HCL (PF) 0.5 % IJ SOLN
INTRAMUSCULAR | Status: DC | PRN
  Administered 2014-04-20: 16 mL

## 2014-04-20 MED ORDER — LIDOCAINE HCL (PF) 2 % IJ SOLN
INTRAMUSCULAR | Status: AC
  Filled 2014-04-20: qty 10

## 2014-04-20 MED ORDER — ROCURONIUM BROMIDE 50 MG/5ML IV SOLN
INTRAVENOUS | Status: AC
Start: 2014-04-20 — End: 2014-04-20
  Filled 2014-04-20: qty 1

## 2014-04-20 MED ORDER — LABETALOL HCL 5 MG/ML IV SOLN
5.0000 mg | Freq: Once | INTRAVENOUS | Status: AC
  Administered 2014-04-20: 5 mg via INTRAVENOUS

## 2014-04-20 MED ORDER — ONDANSETRON HCL 4 MG/2ML IJ SOLN
INTRAMUSCULAR | Status: AC
  Filled 2014-04-20: qty 2

## 2014-04-20 MED ORDER — FENTANYL CITRATE 0.05 MG/ML IJ SOLN
INTRAMUSCULAR | Status: AC
  Filled 2014-04-20: qty 2

## 2014-04-20 MED ORDER — PROPOFOL 10 MG/ML IV BOLUS
INTRAVENOUS | Status: DC | PRN
  Administered 2014-04-20: 170 mg via INTRAVENOUS

## 2014-04-20 MED ORDER — LIDOCAINE HCL (CARDIAC) 20 MG/ML IV SOLN
INTRAVENOUS | Status: DC | PRN
  Administered 2014-04-20: 80 mg via INTRAVENOUS

## 2014-04-20 MED ORDER — 0.9 % SODIUM CHLORIDE (POUR BTL) OPTIME
TOPICAL | Status: DC | PRN
  Administered 2014-04-20: 1000 mL

## 2014-04-20 MED ORDER — LIDOCAINE HCL (PF) 1 % IJ SOLN
INTRAMUSCULAR | Status: AC
  Filled 2014-04-20: qty 5

## 2014-04-20 MED ORDER — CEFAZOLIN SODIUM-DEXTROSE 2-3 GM-% IV SOLR
INTRAVENOUS | Status: AC
  Filled 2014-04-20: qty 50

## 2014-04-20 MED ORDER — KETOROLAC TROMETHAMINE 30 MG/ML IJ SOLN
INTRAMUSCULAR | Status: AC
  Filled 2014-04-20: qty 1

## 2014-04-20 MED ORDER — CHLORHEXIDINE GLUCONATE 4 % EX LIQD: 1.0000 "application " | Freq: Once | CUTANEOUS | Status: DC

## 2014-04-20 MED ORDER — ONDANSETRON HCL 4 MG/2ML IJ SOLN
INTRAMUSCULAR | Status: AC
Start: 2014-04-20 — End: 2014-04-20
  Filled 2014-04-20: qty 2

## 2014-04-20 MED ORDER — HYDROCODONE-ACETAMINOPHEN 5-325 MG PO TABS: 1.0000 | ORAL_TABLET | ORAL | Status: DC | PRN

## 2014-04-20 MED ORDER — NEOSTIGMINE METHYLSULFATE 10 MG/10ML IV SOLN
INTRAVENOUS | Status: DC | PRN
  Administered 2014-04-20: 3 mg via INTRAVENOUS

## 2014-04-20 MED ORDER — LACTATED RINGERS IV SOLN
INTRAVENOUS | Status: DC
  Administered 2014-04-20: 09:00:00 via INTRAVENOUS

## 2014-04-20 MED ORDER — GLYCOPYRROLATE 0.2 MG/ML IJ SOLN
INTRAMUSCULAR | Status: DC | PRN
  Administered 2014-04-20: 0.4 mg via INTRAVENOUS

## 2014-04-20 MED ORDER — PENTAFLUOROPROP-TETRAFLUOROETH EX AERO
INHALATION_SPRAY | CUTANEOUS | Status: AC
  Filled 2014-04-20: qty 103.5

## 2014-04-20 MED ORDER — NEOSTIGMINE METHYLSULFATE 10 MG/10ML IV SOLN
INTRAVENOUS | Status: AC
  Filled 2014-04-20: qty 1

## 2014-04-20 MED ORDER — FENTANYL CITRATE 0.05 MG/ML IJ SOLN
25.0000 ug | INTRAMUSCULAR | Status: DC | PRN
  Administered 2014-04-20 (×2): 50 ug via INTRAVENOUS

## 2014-04-20 MED ORDER — CEFAZOLIN SODIUM-DEXTROSE 2-3 GM-% IV SOLR: 2.0000 g | INTRAVENOUS | Status: DC

## 2014-04-20 MED ORDER — BUPIVACAINE HCL (PF) 0.5 % IJ SOLN
INTRAMUSCULAR | Status: AC
  Filled 2014-04-20: qty 30

## 2014-04-20 MED ORDER — FENTANYL CITRATE 0.05 MG/ML IJ SOLN
INTRAMUSCULAR | Status: DC | PRN
  Administered 2014-04-20: 100 ug via INTRAVENOUS
  Administered 2014-04-20 (×2): 50 ug via INTRAVENOUS

## 2014-04-20 MED ORDER — DEXAMETHASONE SODIUM PHOSPHATE 4 MG/ML IJ SOLN
4.0000 mg | Freq: Once | INTRAMUSCULAR | Status: AC
  Administered 2014-04-20: 4 mg via INTRAVENOUS

## 2014-04-20 MED ORDER — SODIUM CHLORIDE 0.9 % IJ SOLN
INTRAMUSCULAR | Status: AC
  Filled 2014-04-20: qty 10

## 2014-04-20 MED ORDER — ONDANSETRON HCL 4 MG/2ML IJ SOLN: 4.0000 mg | Freq: Once | INTRAMUSCULAR | Status: DC | PRN

## 2014-04-20 MED ORDER — TECHNETIUM TC 99M SULFUR COLLOID FILTERED
1.0000 | Freq: Once | INTRAVENOUS | Status: AC | PRN
  Administered 2014-04-20: 1 via INTRADERMAL

## 2014-04-20 MED ORDER — LACTATED RINGERS IV SOLN
INTRAVENOUS | Status: DC | PRN
  Administered 2014-04-20 (×2): via INTRAVENOUS

## 2014-04-20 MED ORDER — LABETALOL HCL 5 MG/ML IV SOLN
INTRAVENOUS | Status: AC
  Filled 2014-04-20: qty 4

## 2014-04-20 MED ORDER — METHYLENE BLUE 1 % INJ SOLN
INTRAMUSCULAR | Status: AC
  Filled 2014-04-20: qty 2

## 2014-04-20 MED ORDER — SODIUM CHLORIDE 0.9 % IJ SOLN
INTRAMUSCULAR | Status: DC | PRN
  Administered 2014-04-20: 10:00:00

## 2014-04-20 MED ORDER — PROPOFOL 10 MG/ML IV BOLUS
INTRAVENOUS | Status: AC
  Filled 2014-04-20: qty 20

## 2014-04-20 MED ORDER — ONDANSETRON HCL 4 MG/2ML IJ SOLN
INTRAMUSCULAR | Status: DC | PRN
  Administered 2014-04-20: 4 mg via INTRAVENOUS

## 2014-04-20 MED ORDER — ROCURONIUM BROMIDE 100 MG/10ML IV SOLN
INTRAVENOUS | Status: DC | PRN
  Administered 2014-04-20: 30 mg via INTRAVENOUS

## 2014-04-20 MED ORDER — KETOROLAC TROMETHAMINE 30 MG/ML IJ SOLN
30.0000 mg | Freq: Once | INTRAMUSCULAR | Status: AC
  Administered 2014-04-20: 30 mg via INTRAVENOUS

## 2014-04-20 SURGICAL SUPPLY — 48 items
ADH SKN CLS APL DERMABOND .7 (GAUZE/BANDAGES/DRESSINGS) ×1
APPLIER CLIP 9.375 SM OPEN (CLIP) ×3
APR CLP SM 9.3 20 MLT OPN (CLIP) ×1
BAG HAMPER (MISCELLANEOUS) ×3 IMPLANT
BNDG CMPR 82X61 PLY HI ABS (GAUZE/BANDAGES/DRESSINGS)
BNDG CONFORM 6X.82 1P STRL (GAUZE/BANDAGES/DRESSINGS) ×1 IMPLANT
CLIP APPLIE 9.375 SM OPEN (CLIP) ×1 IMPLANT
CLOSURE WOUND 1/4 X3 (GAUZE/BANDAGES/DRESSINGS)
CLOTH BEACON ORANGE TIMEOUT ST (SAFETY) ×3 IMPLANT
CONT SPECI 4OZ STER CLIK (MISCELLANEOUS) ×1 IMPLANT
COVER LIGHT HANDLE STERIS (MISCELLANEOUS) ×6 IMPLANT
COVER PROBE W GEL 5X96 (DRAPES) ×3 IMPLANT
DECANTER SPIKE VIAL GLASS SM (MISCELLANEOUS) ×3 IMPLANT
DERMABOND ADVANCED (GAUZE/BANDAGES/DRESSINGS) ×2
DERMABOND ADVANCED .7 DNX12 (GAUZE/BANDAGES/DRESSINGS) IMPLANT
DURAPREP 26ML APPLICATOR (WOUND CARE) ×3 IMPLANT
ELECT REM PT RETURN 9FT ADLT (ELECTROSURGICAL) ×3
ELECTRODE REM PT RTRN 9FT ADLT (ELECTROSURGICAL) ×1 IMPLANT
GLOVE BIOGEL PI IND STRL 7.0 (GLOVE) IMPLANT
GLOVE BIOGEL PI INDICATOR 7.0 (GLOVE) ×6
GLOVE ECLIPSE 6.5 STRL STRAW (GLOVE) ×2 IMPLANT
GLOVE EXAM NITRILE MD LF STRL (GLOVE) ×2 IMPLANT
GLOVE SS BIOGEL STRL SZ 6.5 (GLOVE) IMPLANT
GLOVE SUPERSENSE BIOGEL SZ 6.5 (GLOVE) ×4
GLOVE SURG SS PI 7.5 STRL IVOR (GLOVE) ×6 IMPLANT
GOWN STRL REUS W/TWL LRG LVL3 (GOWN DISPOSABLE) ×9 IMPLANT
KIT ROOM TURNOVER APOR (KITS) ×3 IMPLANT
MANIFOLD NEPTUNE II (INSTRUMENTS) ×3 IMPLANT
NDL HYPO 18GX1.5 BLUNT FILL (NEEDLE) ×1 IMPLANT
NDL HYPO 25X1 1.5 SAFETY (NEEDLE) ×1 IMPLANT
NEEDLE HYPO 18GX1.5 BLUNT FILL (NEEDLE) ×3 IMPLANT
NEEDLE HYPO 25X1 1.5 SAFETY (NEEDLE) ×6 IMPLANT
NS IRRIG 1000ML POUR BTL (IV SOLUTION) ×3 IMPLANT
PACK MINOR (CUSTOM PROCEDURE TRAY) ×3 IMPLANT
PAD ARMBOARD 7.5X6 YLW CONV (MISCELLANEOUS) ×3 IMPLANT
SET BASIN LINEN APH (SET/KITS/TRAYS/PACK) ×3 IMPLANT
SPONGE GAUZE 2X2 8PLY STER LF (GAUZE/BANDAGES/DRESSINGS)
SPONGE GAUZE 2X2 8PLY STRL LF (GAUZE/BANDAGES/DRESSINGS) IMPLANT
SPONGE LAP 18X18 X RAY DECT (DISPOSABLE) ×3 IMPLANT
STOCKINETTE IMPERVIOUS LG (DRAPES) ×3 IMPLANT
STRIP CLOSURE SKIN 1/4X3 (GAUZE/BANDAGES/DRESSINGS) IMPLANT
SUT SILK 0 FSL (SUTURE) ×3 IMPLANT
SUT VIC AB 3-0 SH 27 (SUTURE) ×6
SUT VIC AB 3-0 SH 27X BRD (SUTURE) ×1 IMPLANT
SUT VIC AB 4-0 PS2 27 (SUTURE) ×5 IMPLANT
SYR CONTROL 10ML LL (SYRINGE) ×2 IMPLANT
SYRINGE CONTROL L 12CC (SYRINGE) IMPLANT
SYRINGE CONTROL LL 12CC (SYRINGE) ×1 IMPLANT

## 2014-04-20 NOTE — Anesthesia Preprocedure Evaluation (Addendum)
Anesthesia Evaluation    History of Anesthesia Complications (+) history of anesthetic complications (muscle pain after sux)  Airway Mallampati: II TM Distance: >3 FB Neck ROM: Full    Dental  (+) Teeth Intact   Pulmonary former smoker,  breath sounds clear to auscultation        Cardiovascular hypertension, Pt. on medications Rhythm:Regular Rate:Normal     Neuro/Psych    GI/Hepatic PUD,   Endo/Other  Hypothyroidism   Renal/GU      Musculoskeletal   Abdominal   Peds  Hematology   Anesthesia Other Findings   Reproductive/Obstetrics                          Anesthesia Physical Anesthesia Plan  ASA: II  Anesthesia Plan: General   Post-op Pain Management:    Induction: Intravenous  Airway Management Planned: Oral ETT  Additional Equipment:   Intra-op Plan:   Post-operative Plan: Extubation in OR  Informed Consent: I have reviewed the patients History and Physical, chart, labs and discussed the procedure including the risks, benefits and alternatives for the proposed anesthesia with the patient or authorized representative who has indicated his/her understanding and acceptance.     Plan Discussed with:   Anesthesia Plan Comments:         Anesthesia Quick Evaluation

## 2014-04-20 NOTE — Transfer of Care (Signed)
Immediate Anesthesia Transfer of Care Note  Patient: Beth Hamilton  Procedure(s) Performed: Procedure(s): PARTIAL MASTECTOMY AFTER NEEDLE LOCALIZATION AND AXILLARY SENTINEL LYMPH NODE BX (Left)  Patient Location: PACU  Anesthesia Type:General  Level of Consciousness: awake, alert , oriented and patient cooperative  Airway & Oxygen Therapy: Patient Spontanous Breathing and Patient connected to nasal cannula oxygen  Post-op Assessment: Report given to PACU RN, Post -op Vital signs reviewed and stable and Patient moving all extremities  Post vital signs: Reviewed and stable  Complications: No apparent anesthesia complications

## 2014-04-20 NOTE — OR Nursing (Signed)
[facility] called and said they are ready for patient.  Took pt. to [facility] via Wheelchair for needle loc.

## 2014-04-20 NOTE — Anesthesia Postprocedure Evaluation (Signed)
  Anesthesia Post-op Note  Patient: Beth Hamilton  Procedure(s) Performed: Procedure(s): PARTIAL MASTECTOMY AFTER NEEDLE LOCALIZATION AND AXILLARY SENTINEL LYMPH NODE BX (Left)  Patient Location: PACU  Anesthesia Type:General  Level of Consciousness: awake, alert , oriented and pateint uncooperative  Airway and Oxygen Therapy: Patient Spontanous Breathing and Patient connected to nasal cannula oxygen  Post-op Pain: none  Post-op Assessment: Post-op Vital signs reviewed, Patient's Cardiovascular Status Stable, Respiratory Function Stable, No signs of Nausea or vomiting and Pain level controlled  Post-op Vital Signs: Reviewed and stable  Last Vitals:  Filed Vitals:   04/20/14 1104  BP: 150/85  Pulse:   Temp: 36.4 C  Resp:     Complications: No apparent anesthesia complications

## 2014-04-20 NOTE — Discharge Instructions (Signed)
Segmental Mastectomy and Axillary Lymph Node Dissection Care After These discharge instructions provide you with general information on caring for yourself after you leave the hospital. While your treatment has been planned according to the most current medical practices available, unavoidable complications occasionally occur. It is important that you know the warning signs of complications so that you can seek treatment. Please read the instructions outlined below and refer to this sheet in the next few weeks. Your doctor may also give you specific information. If you have any complications or questions after discharge, please call your doctor.  ACTIVITY  Your doctor will tell you when you may resume strenuous activities, driving and sports. After the drain(s) are removed, you may do light housework, but avoid heavy lifting, carrying or pushing (you should not be lifting anything heavier than 5 lbs.).  Take frequent rest periods. You may tire more easily than usual. Always rest and elevate the arm affected by your surgery for a period of time equal to your activity time.  Continue doing the exercises given to you by the physical therapist/occupational therapist even after full range of motion has returned. The amount of time this takes will vary from person to person. Generally, after normal range of motion has returned, some stiffness and soreness may persist for 2-3 months. This is normal and should subside.  Begin sports or strenuous activities in moderation, giving you a chance to rebuild your endurance.  Continue to be cautious of heavy lifting or carrying (not more than 10 lbs.) with the affected arm. You may return to work as recommended by your doctor. NUTRITION  You may resume your normal diet.  Make sure you drink plenty of fluids (6-8 glasses per day).  Eat a well-balanced diet.  Daily portions of food from the grains, vegetables, fruits, milk, and meat and beans families are  necessary for your health. You may visit [employer] for more dietary information. HYGIENE  You may wash your hair.  If your incision (the cut from surgery) is healed, you may shower or take a bath, unless instructed otherwise by your caregiver. FEVER If you feel feverish or have shaking chills, take your temperature. If your temperature is 102 F (38.9 C) or above, call your caregiver. The fever may mean there is an infection. If you call early, infection can be treated with antibiotics and hospitalization may be avoided. PAIN CONTROL  Mild discomfort may occur. You may need to take an "over-the-counter" pain medication or a medication prescribed by your doctor.  Call your doctor if you experience increased pain. INCISION CARE Check your incision daily for increased redness, drainage, swelling or separation of skin edges. Call your doctor if any of the above are noted. DRAIN CARE If you have a drain in place, ask for instructions for "Surgical Drain Care". ARM AND HAND CARE  Since the lymph nodes under your arm have been removed, there may be a greater chance for the arm to swell.  Avoid, if possible, having blood pressures taken, blood drawn or injections given in the affected arm.  Use hand lotion to soften fingernail cuticles instead of cutting them to avoid cutting yourself.  Use an electric shaver to shave your underarms.  You may use a deodorant after the incision has completely healed.  Be careful not to cut yourself when cooking, sewing or gardening.  Do not weigh your arm straight down with a package or your purse. Note: Follow the exercises and instructions given to you by the physical  therapist/occupational therapist and your treating doctor.  Document Released: 02/20/2004 Document Revised: 09/30/2011 Document Reviewed: 09/29/2007 [facility] Patient Information 2015 [place], [place]. This information is not intended to replace advice given to you by your  health care provider. Make sure you discuss any questions you have with your health care provider.

## 2014-04-20 NOTE — OR Nursing (Signed)
Sentinel node injection completed at 0730,  Waiting on [facility] to call , to let me know when she is ready to come for needle loc.

## 2014-04-20 NOTE — Anesthesia Procedure Notes (Addendum)
Procedure Name: Intubation Performed by: Oscar Forman Pre-anesthesia Checklist: Patient identified, Patient being monitored, Timeout performed, Emergency Drugs available and Suction available Patient Re-evaluated:Patient Re-evaluated prior to inductionOxygen Delivery Method: Circle System Utilized Preoxygenation: Pre-oxygenation with 100% oxygen Intubation Type: IV induction Ventilation: Mask ventilation without difficulty Laryngoscope Size: Miller and 2 Grade View: Grade I Tube type: Oral Tube size: 7.0 mm Number of attempts: 1 Airway Equipment and Method: stylet Placement Confirmation: ETT inserted through vocal cords under direct vision,  positive ETCO2 and breath sounds checked- equal and bilateral Secured at: 21 cm Tube secured with: Tape Dental Injury: Teeth and Oropharynx as per pre-operative assessment    Performed by: Pixie Burgener

## 2014-04-20 NOTE — Op Note (Signed)
Patient:  Beth Hamilton  DOB:  July 01, 1956  MRN:  [id number]   Preop Diagnosis:  Left breast carcinoma  Postop Diagnosis:  Same  Procedure:  Left partial mastectomy after needle localization, sentinel axillary lymph node biopsy  Surgeon:  Aviva Signs, M.D.  Anes:  General endotracheal  Indications:  Patient is a 58 year old white female who was found to have invasive ductal carcinoma of the left breast. After extensive discussion the patient, she was elected to proceed with a left partial mastectomy after needle localization, sentinel lymph node biopsy, possible axillary dissection. The risks and benefits of the procedures including bleeding, infection, nerve injury, the possibility of needing an axillary dissection were fully explained to the patient, who gave informed consent.  Procedure note:  The patient was placed in the supine position. She had already undergone needle localization as well as radioactive nuclide injection previously. After general anesthesia was administered, 4 cc of dilute blue dye was instilled the surrounding tumor massaged for 5 minutes. The left breast and axilla were then prepped and draped using usual sterile technique with DuraPrep. Surgical site confirmation was performed.  The first turned my attention to the left axilla. Using a neoprobe, the dissection was taken down to the sentinel lymph nodes. 2 sentinel lymph nodes that were blue were found. They were sent for frozen section. Both were negative for malignancy. Basin counts were less than 10% of the in vivo counts. Any bleeding was controlled using small clips.  0.5% Sensorcaine was instilled the surrounding wound. The wound is closed with 3-0 Vicryl subcutaneous sutures. The skin was closed using a 4 Vicryl subcuticular suture. Dermabond was applied at the end of the procedure.  Next, an incision was made at the point of the entrance of the guidewire. This was located at the 9:00 position in the left  breast. Dissection was taken down to the chest wall. Murmur appearing breast tissue is noted around the tip of the needle. A specimen was removed and sent for specimen radiography. The clip and suspicious lesion within the specimen removed. The specimen was then sent to pathology further examination. Any bleeding was controlled using Bovie electrocautery. The subcutaneous layer was reapproximated using 3-0 Vicryl interrupted suture. The skin was closed a 4 Vicryl subcuticular suture. 0.5% Sensorcaine was instilled the surrounding wound. Dermabond was then applied.  All tape and needle counts were correct the end of the procedures. The patient was extubated in the operating room and transferred back in stable condition.  Complications:  None  EBL:  Minimal  Specimen:  Sentinel lymph nodes of left axilla, left breast tissue

## 2014-04-20 NOTE — Procedures (Signed)
PreOperative Dx: LEFT breast cancer Postoperative Dx: LEFT breast cancer Procedure:   Mammographic guided needle localization LEFT breast Radiologist:  Thornton Papas Anesthesia:  1 ml of 2% lidocaine Specimen:  From OR EBL:   < 1 ml Complications: None

## 2014-04-20 NOTE — OR Nursing (Signed)
Xray notified of patient here and ready for sentinel node injection

## 2014-04-20 NOTE — Interval H&P Note (Signed)
History and Physical Interval Note:  04/20/2014 9:08 AM  Beth Hamilton  has presented today for surgery, with the diagnosis of left breast cancer  The various methods of treatment have been discussed with the patient and family. After consideration of risks, benefits and other options for treatment, the patient has consented to  Procedure(s) with comments: PARTIAL MASTECTOMY WITH NEEDLE LOCALIZATION AND AXILLARY SENTINEL LYMPH NODE BX (Left) - sentinel node at 7 - needle loc at 7:30 as a surgical intervention .  The patient's history has been reviewed, patient examined, no change in status, stable for surgery.  I have reviewed the patient's chart and labs.  Questions were answered to the patient's satisfaction.     Aviva Signs A

## 2014-05-05 ENCOUNTER — Encounter (HOSPITAL_COMMUNITY): Payer: Self-pay | Admitting: *Deleted

## 2014-05-05 ENCOUNTER — Other Ambulatory Visit (HOSPITAL_COMMUNITY): Payer: Self-pay | Admitting: *Deleted

## 2014-05-05 NOTE — Progress Notes (Signed)
Oncotype submitted online and faxed to [facility].

## 2014-05-06 ENCOUNTER — Other Ambulatory Visit: Payer: BC Managed Care – PPO | Admitting: Obstetrics & Gynecology

## 2014-05-09 ENCOUNTER — Encounter (HOSPITAL_COMMUNITY): Payer: Self-pay

## 2014-05-09 ENCOUNTER — Encounter (HOSPITAL_COMMUNITY): Payer: BC Managed Care – PPO | Attending: Hematology and Oncology

## 2014-05-09 VITALS — BP 169/93 | HR 95 | Temp 98.8°F | Resp 16 | Ht 63.5 in | Wt 179.6 lb

## 2014-05-09 DIAGNOSIS — Z170 Estrogen receptor positive status [ER+]: Secondary | ICD-10-CM

## 2014-05-09 DIAGNOSIS — C50512 Malignant neoplasm of lower-outer quadrant of left female breast: Secondary | ICD-10-CM | POA: Insufficient documentation

## 2014-05-09 DIAGNOSIS — I10 Essential (primary) hypertension: Secondary | ICD-10-CM

## 2014-05-09 DIAGNOSIS — K5190 Ulcerative colitis, unspecified, without complications: Secondary | ICD-10-CM | POA: Diagnosis not present

## 2014-05-09 DIAGNOSIS — K529 Noninfective gastroenteritis and colitis, unspecified: Secondary | ICD-10-CM

## 2014-05-09 DIAGNOSIS — C50912 Malignant neoplasm of unspecified site of left female breast: Secondary | ICD-10-CM

## 2014-05-09 LAB — COMPREHENSIVE METABOLIC PANEL
ALT: 37 U/L — AB (ref 0–35)
AST: 28 U/L (ref 0–37)
Albumin: 4.3 g/dL (ref 3.5–5.2)
Alkaline Phosphatase: 77 U/L (ref 39–117)
Anion gap: 12 (ref 5–15)
BUN: 18 mg/dL (ref 6–23)
CALCIUM: 10 mg/dL (ref 8.4–10.5)
CO2: 27 mEq/L (ref 19–32)
Chloride: 103 mEq/L (ref 96–112)
Creatinine, Ser: 0.64 mg/dL (ref 0.50–1.10)
GLUCOSE: 111 mg/dL — AB (ref 70–99)
Potassium: 4.5 mEq/L (ref 3.7–5.3)
SODIUM: 142 meq/L (ref 137–147)
Total Bilirubin: 0.5 mg/dL (ref 0.3–1.2)
Total Protein: 7.9 g/dL (ref 6.0–8.3)

## 2014-05-09 LAB — CBC WITH DIFFERENTIAL/PLATELET
BASOS ABS: 0 10*3/uL (ref 0.0–0.1)
Basophils Relative: 0 % (ref 0–1)
EOS ABS: 0.2 10*3/uL (ref 0.0–0.7)
EOS PCT: 2 % (ref 0–5)
HEMATOCRIT: 42.6 % (ref 36.0–46.0)
Hemoglobin: 14 g/dL (ref 12.0–15.0)
LYMPHS ABS: 2.7 10*3/uL (ref 0.7–4.0)
Lymphocytes Relative: 37 % (ref 12–46)
MCH: 33.6 pg (ref 26.0–34.0)
MCHC: 32.9 g/dL (ref 30.0–36.0)
MCV: 102.2 fL — ABNORMAL HIGH (ref 78.0–100.0)
Monocytes Absolute: 0.6 10*3/uL (ref 0.1–1.0)
Monocytes Relative: 7 % (ref 3–12)
Neutro Abs: 3.9 10*3/uL (ref 1.7–7.7)
Neutrophils Relative %: 53 % (ref 43–77)
PLATELETS: ADEQUATE 10*3/uL (ref 150–400)
RBC: 4.17 MIL/uL (ref 3.87–5.11)
RDW: 12.6 % (ref 11.5–15.5)
WBC: 7.4 10*3/uL (ref 4.0–10.5)

## 2014-05-09 NOTE — Progress Notes (Signed)
Beth Hamilton presented for labwork. Labs per MD order drawn via Peripheral Line 23 gauge needle inserted in right AC  Good blood return present. Procedure without incident.  Needle removed intact. Patient tolerated procedure well.

## 2014-05-09 NOTE — Patient Instructions (Signed)
[facility] Discharge Instructions  RECOMMENDATIONS MADE BY THE CONSULTANT AND ANY TEST RESULTS WILL BE SENT TO YOUR REFERRING PHYSICIAN.  EXAM FINDINGS BY THE PHYSICIAN TODAY AND SIGNS OR SYMPTOMS TO REPORT TO CLINIC OR PRIMARY PHYSICIAN: Exam and findings as discussed by Dr. Barnet Glasgow.  Need to get the results of the Oncotype DX which will tell us if you need chemotherapy on not.  May need to have radiation therapy but will know more once we get the Oncotype results.  Will see you back in 2 weeks for definitive plan of care.    INSTRUCTIONS/FOLLOW-UP: Follow-up in 2 weeks  Thank you for choosing [facility] to provide your oncology and hematology care.  To afford each patient quality time with our providers, please arrive at least 15 minutes before your scheduled appointment time.  With your help, our goal is to use those 15 minutes to complete the necessary work-up to ensure our physicians have the information they need to help with your evaluation and healthcare recommendations.    Effective January 1st, 2014, we ask that you re-schedule your appointment with our physicians should you arrive 10 or more minutes late for your appointment.  We strive to give you quality time with our providers, and arriving late affects you and other patients whose appointments are after yours.    Again, thank you for choosing [facility].  Our hope is that these requests will decrease the amount of time that you wait before being seen by our physicians.       _____________________________________________________________  Should you have questions after your visit to [facility], please contact our office at [phone] between the hours of 8:30 a.m. and 4:30 p.m.  Voicemails left after 4:30 p.m. will not be returned until the following business day.  For prescription refill requests, have your pharmacy contact our office with your prescription refill  request.    _______________________________________________________________  We hope that we have given you very good care.  You may receive a patient satisfaction survey in the mail, please complete it and return it as soon as possible.  We value your feedback!  _______________________________________________________________  Have you asked about our STAR program?  STAR stands for Survivorship Training and Rehabilitation, and this is a nationally recognized cancer care program that focuses on survivorship and rehabilitation.  Cancer and cancer treatments may cause problems, such as, pain, making you feel tired and keeping you from doing the things that you need or want to do. Cancer rehabilitation can help. Our goal is to reduce these troubling effects and help you have the best quality of life possible.  You may receive a survey from a nurse that asks questions about your current state of health.  Based on the survey results, all eligible patients will be referred to the [facility] program for an evaluation so we can better serve you!  A frequently asked questions sheet is available upon request.

## 2014-05-09 NOTE — Progress Notes (Signed)
[place] A. Barnet Glasgow, M.D.  NEW PATIENT EVALUATION   Name: ALESHIA CARTELLI Date: 05/09/2014 MRN: [id number] DOB: 1955-08-11  PCP: [name], MD   REFERRING PHYSICIAN: [name], MD  REASON FOR REFERRAL: Left breast cancer   HISTORY OF PRESENT ILLNESS:Beth Hamilton is a 58 y.o. female who is referred by her family physician and surgeon for evaluation and management recommendations after new diagnosis of left breast cancer and noninvasive ductal neoplasia. On routine mammography an abnormality was found in the left breast. Biopsy was performed on 04/05/2014 at which time a combination of infiltrating ductal carcinoma and noninvasive ductal neoplasia were found, ER 100%, PR 100%. The patient had been scheduled for a followup colonoscopy for colitis which was performed on 04/10/2014 with one area of colitis noted. On 04/20/2014 she underwent left lumpectomy and sentinel he at which time 0.9 cm infiltrating ductal carcinoma was found, ER/PR positive, HER-2/neu not overexpressed with 2 lymph nodes negative. She is here today for discussion of possible additional treatment. She has not had any left upper extremity swelling and only took 1 dose of analgesic after surgery. Colitis is well controlled with medication. She denies any rectal bleeding, frequent diarrhea, cough, wheezing, sore throat, PND, orthopnea, palpitations, left upper extremity lymphedema, melena, hematochezia, hematuria, or vaginal bleeding. Last menstrual period was about 5 years ago. There is no family history of breast cancer in her family she is gravida 2 para 1 AB 1.   PAST MEDICAL HISTORY:  has a past medical history of IBD (inflammatory bowel disease); Hemorrhoids; Psoriasis; Thyroid disease; Ulcerative colitis (5 1 2007); Complication of anesthesia; Hypertension; Hypothyroidism; and Breast cancer (04/20/14).     PAST SURGICAL HISTORY: Past Surgical History    Procedure Laterality Date  . Wisdom tooth extraction    . Colonoscopy  10/09/2001    RMR: Internal hemorrhoids and anal papilla; otherwise normal rectum  . Colonoscopy  12/10/2005    incomplete tcs-  colitis  . Flexible sigmoidoscopy  02/26/2006    endoscopically normal-appearing rectum, colitis in sigmoid mucosa  . Tubal ligation    . Dilation and curettage of uterus  1996    Dr Heide Spark  . Colonoscopy N/A 04/18/2014    Procedure: COLONOSCOPY;  Surgeon: Daneil Dolin, MD;  Location: [facility];  Service: Endoscopy;  Laterality: N/A;  7:30 AM  . Partial mastectomy with needle localization and axillary sentinel lymph node bx Left 04/20/2014    Procedure: PARTIAL MASTECTOMY AFTER NEEDLE LOCALIZATION AND AXILLARY SENTINEL LYMPH NODE BX;  Surgeon: Jamesetta So, MD;  Location: [facility] OR[facility];  Service: General;  Laterality: Left;     CURRENT MEDICATIONS: has a current medication list which includes the following prescription(s): armour thyroid, lisinopril, mesalamine, and hydrocodone-acetaminophen.   ALLERGIES: Flagyl; Other; and Levaquin   SOCIAL HISTORY:  reports that she has quit smoking. Her smoking use included Cigarettes. She has a 7.5 pack-year smoking history. She quit smokeless tobacco use about 8 years ago. She reports that she drinks alcohol. She reports that she does not use illicit drugs.   FAMILY HISTORY: family history includes Diabetes in her other; Heart disease in her other; Stroke in her maternal grandfather. There is no history of Colon cancer.    REVIEW OF SYSTEMS:  Other than that discussed above is noncontributory.    PHYSICAL EXAM:  height is 5' 3.5" (1.613 m) and weight is 179 lb 9.6 oz (81.466 kg). Her oral  temperature is 98.8 F (37.1 C). Her blood pressure is 169/93 and her pulse is 95. Her respiration is 16 and oxygen saturation is 98%.    GENERAL:alert, no distress and comfortable SKIN: skin color, texture, turgor are normal, no rashes or significant  lesions EYES: normal, Conjunctiva are pink and non-injected, sclera clear OROPHARYNX:no exudate, no erythema and lips, buccal mucosa, and tongue normal  NECK: supple, thyroid normal size, non-tender, without nodularity CHEST: Status post left breast lumpectomy with no masses in either breast. Surgical wounds are healing well. No axillary masses. LYMPH:  no palpable lymphadenopathy in the cervical, axillary or inguinal LUNGS: clear to auscultation and percussion with normal breathing effort HEART: regular rate & rhythm and no murmurs ABDOMEN:abdomen soft, non-tender and normal bowel sounds MUSCULOSKELETALl:no cyanosis of digits, no clubbing or edema  NEURO: alert & oriented x 3 with fluent speech, no focal motor/sensory deficits    LABORATORY DATA:  Office Visit on 05/09/2014  Component Date Value Ref Range Status  . WBC 05/09/2014 7.4  4.0 - 10.5 K/uL Final  . RBC 05/09/2014 4.17  3.87 - 5.11 MIL/uL Final  . Hemoglobin 05/09/2014 14.0  12.0 - 15.0 g/dL Final  . HCT 05/09/2014 42.6  36.0 - 46.0 % Final  . MCV 05/09/2014 102.2* 78.0 - 100.0 fL Final  . MCH 05/09/2014 33.6  26.0 - 34.0 pg Final  . MCHC 05/09/2014 32.9  30.0 - 36.0 g/dL Final  . RDW 05/09/2014 12.6  11.5 - 15.5 % Final  . Platelets 05/09/2014 PLATELET CLUMPS NOTED ON SMEAR, COUNT APPEARS ADEQUATE  150 - 400 K/uL Final  . Neutrophils Relative % 05/09/2014 53  43 - 77 % Final  . Neutro Abs 05/09/2014 3.9  1.7 - 7.7 K/uL Final  . Lymphocytes Relative 05/09/2014 37  12 - 46 % Final  . Lymphs Abs 05/09/2014 2.7  0.7 - 4.0 K/uL Final  . Monocytes Relative 05/09/2014 7  3 - 12 % Final  . Monocytes Absolute 05/09/2014 0.6  0.1 - 1.0 K/uL Final  . Eosinophils Relative 05/09/2014 2  0 - 5 % Final  . Eosinophils Absolute 05/09/2014 0.2  0.0 - 0.7 K/uL Final  . Basophils Relative 05/09/2014 0  0 - 1 % Final  . Basophils Absolute 05/09/2014 0.0  0.0 - 0.1 K/uL Final  . Sodium 05/09/2014 142  137 - 147 mEq/L Final  . Potassium  05/09/2014 4.5  3.7 - 5.3 mEq/L Final  . Chloride 05/09/2014 103  96 - 112 mEq/L Final  . CO2 05/09/2014 27  19 - 32 mEq/L Final  . Glucose, Bld 05/09/2014 111* 70 - 99 mg/dL Final  . BUN 05/09/2014 18  6 - 23 mg/dL Final  . Creatinine, Ser 05/09/2014 0.64  0.50 - 1.10 mg/dL Final  . Calcium 05/09/2014 10.0  8.4 - 10.5 mg/dL Final  . Total Protein 05/09/2014 7.9  6.0 - 8.3 g/dL Final  . Albumin 05/09/2014 4.3  3.5 - 5.2 g/dL Final  . AST 05/09/2014 28  0 - 37 U/L Final  . ALT 05/09/2014 37* 0 - 35 U/L Final  . Alkaline Phosphatase 05/09/2014 77  39 - 117 U/L Final  . Total Bilirubin 05/09/2014 0.5  0.3 - 1.2 mg/dL Final  . GFR calc non Af Amer 05/09/2014 >90  >90 mL/min Final  . GFR calc Af Amer 05/09/2014 >90  >90 mL/min Final   Comment: (NOTE)  The eGFR has been calculated using the CKD EPI equation.                          This calculation has not been validated in all clinical situations.                          eGFR's persistently <90 mL/min signify possible Chronic Kidney                          Disease.  . Anion gap 05/09/2014 12  5 - 15 Final  Admission on 04/20/2014, Discharged on 04/20/2014  Component Date Value Ref Range Status  . WBC 04/15/2014 7.6  4.0 - 10.5 K/uL Final  . RBC 04/15/2014 4.22  3.87 - 5.11 MIL/uL Final  . Hemoglobin 04/15/2014 14.3  12.0 - 15.0 g/dL Final  . HCT 04/15/2014 43.5  36.0 - 46.0 % Final  . MCV 04/15/2014 103.1* 78.0 - 100.0 fL Final  . MCH 04/15/2014 33.9  26.0 - 34.0 pg Final  . MCHC 04/15/2014 32.9  30.0 - 36.0 g/dL Final  . RDW 04/15/2014 12.4  11.5 - 15.5 % Final  . Platelets 04/15/2014 234  150 - 400 K/uL Final  . Neutrophils Relative % 04/15/2014 53  43 - 77 % Final  . Neutro Abs 04/15/2014 4.0  1.7 - 7.7 K/uL Final  . Lymphocytes Relative 04/15/2014 39  12 - 46 % Final  . Lymphs Abs 04/15/2014 3.0  0.7 - 4.0 K/uL Final  . Monocytes Relative 04/15/2014 7  3 - 12 % Final  . Monocytes Absolute 04/15/2014 0.6   0.1 - 1.0 K/uL Final  . Eosinophils Relative 04/15/2014 1  0 - 5 % Final  . Eosinophils Absolute 04/15/2014 0.1  0.0 - 0.7 K/uL Final  . Basophils Relative 04/15/2014 0  0 - 1 % Final  . Basophils Absolute 04/15/2014 0.0  0.0 - 0.1 K/uL Final  . Sodium 04/15/2014 142  137 - 147 mEq/L Final  . Potassium 04/15/2014 4.1  3.7 - 5.3 mEq/L Final  . Chloride 04/15/2014 103  96 - 112 mEq/L Final  . CO2 04/15/2014 26  19 - 32 mEq/L Final  . Glucose, Bld 04/15/2014 118* 70 - 99 mg/dL Final  . BUN 04/15/2014 13  6 - 23 mg/dL Final  . Creatinine, Ser 04/15/2014 0.68  0.50 - 1.10 mg/dL Final  . Calcium 04/15/2014 9.7  8.4 - 10.5 mg/dL Final  . Total Protein 04/15/2014 7.4  6.0 - 8.3 g/dL Final  . Albumin 04/15/2014 4.0  3.5 - 5.2 g/dL Final  . AST 04/15/2014 28  0 - 37 U/L Final  . ALT 04/15/2014 36* 0 - 35 U/L Final  . Alkaline Phosphatase 04/15/2014 68  39 - 117 U/L Final  . Total Bilirubin 04/15/2014 0.9  0.3 - 1.2 mg/dL Final  . GFR calc non Af Amer 04/15/2014 >90  >90 mL/min Final  . GFR calc Af Amer 04/15/2014 >90  >90 mL/min Final   Comment: (NOTE)                          The eGFR has been calculated using the CKD EPI equation.                          This calculation has not been  validated in all clinical situations.                          eGFR's persistently <90 mL/min signify possible Chronic Kidney                          Disease.  . Anion gap 04/15/2014 13  5 - 15 Final    Urinalysis No results found for this basename: colorurine,  appearanceur,  labspec,  phurine,  glucoseu,  hgbur,  bilirubinur,  ketonesur,  proteinur,  urobilinogen,  nitrite,  leukocytesur      @RADIOGRAPHY : Chest 2 View  04/15/2014   CLINICAL DATA:  LEFT breast cancer, preoperative evaluation, former smoker, history hypertension, ulcerative colitis  EXAM: CHEST  2 VIEW  COMPARISON:  None  FINDINGS: Normal heart size, mediastinal contours, and pulmonary vascularity.  Lungs clear.  No pleural effusion or  pneumothorax.  Osseous demineralization.  IMPRESSION: No acute abnormalities.   Electronically Signed   By: Lavonia Dana M.D.   On: 04/15/2014 15:05   Nm Sentinel Node Inj-no Rpt (breast)  04/20/2014   CLINICAL DATA: Breast Cancer Left   Sulfur colloid was injected intradermally by the nuclear medicine  technologist for breast cancer sentinel node localization.    Mm Lt Breast Bx W Loc Dev 1st Lesion Image Bx Spec Stereo Guide  04/20/2014   CLINICAL DATA:  LEFT breast cancer, lumpectomy post preoperative needle localization  EXAM: SPECIMEN RADIOGRAPH OF THE LEFT BREAST  COMPARISON:  Preoperative mammogram 04/20/2014  FINDINGS: Status post excision of the LEFT breast.  The wire tip and biopsy marker clip are present.  The stellate density which was preoperatively localized is contained within the specimen.  IMPRESSION: Specimen radiograph of the LEFT breast containing the biopsy clip and preoperative localized stellate density.   Electronically Signed   By: Lavonia Dana M.D.   On: 04/20/2014 16:08   Mm Lt Plc Breast Loc Dev   1st Lesion  Inc Mammo Guide  04/20/2014   CLINICAL DATA:  LEFT breast cancer, preoperative needle localization of biopsy site  EXAM: NEEDLE LOCALIZATION OF THE LEFT BREAST WITH MAMMO GUIDANCE  COMPARISON:  Mammography with biopsy 04/05/2014  FINDINGS: Patient presents for needle localization prior to LEFT lumpectomy. I met with the patient and we discussed the procedure of needle localization including benefits and alternatives. We discussed the high likelihood of a successful procedure. We discussed the risks of the procedure, including infection, bleeding, tissue injury, and further surgery. Informed, written consent was given. The usual time-out protocol was performed immediately prior to the procedure.  Using mammographic guidance, sterile technique, 2% lidocaine and a 5 cm length modified Kopans needle, the biopsy clip and associated asymmetric density at the posterior medial LEFT  breast at the 9 o'clock position was localized using a free hand approach. The films were marked for Dr. Arnoldo Morale.  IMPRESSION: Needle localization LEFT breast under mammographic guidance.  No apparent complications.   Electronically Signed   By: Lavonia Dana M.D.   On: 04/20/2014 16:07    PATHOLOGY:  FINAL for SUDIE, BANDEL ([id number]) Patient: LOANN, CHAHAL Collected: 04/05/2014 Client: [facility] Accession: [id number] Received: 04/05/2014 Trudee Kuster, MD DOB: 03/11/1956 Age: 89 Gender: F Reported: 04/06/2014 [address], [place] [address] Patient Ph: [phone] MRN #: [id number] [place], [place] [address] Client Acc#: Chart #: [id number] Phone: Fax: CC: Geralyn Flash REPORT  OF SURGICAL PATHOLOGY ADDITIONAL INFORMATION: PROGNOSTIC INDICATORS - ACIS Results: IMMUNOHISTOCHEMICAL AND MORPHOMETRIC ANALYSIS BY THE AUTOMATED CELLULAR IMAGING SYSTEM (ACIS) Estrogen Receptor: 100%, POSITIVE, STRONG STAINING INTENSITY Progesterone Receptor: 100%, POSITIVE, STRONG STAINING INTENSITY Proliferation Marker Ki67: 10% REFERENCE RANGE ESTROGEN RECEPTOR NEGATIVE <1% POSITIVE =>1% PROGESTERONE RECEPTOR NEGATIVE <1% POSITIVE =>1% All controls stained appropriately Claudette Laws MD Pathologist, Electronic Signature ( Signed 04/08/2014) FINAL DIAGNOSIS Diagnosis Breast, left, needle core biopsy, 9 o'clock - INVASIVE DUCTAL CARCINOMA. - DUCTAL CARCINOMA IN SITU. Microscopic Comment The findings are consistent with grade I invasive ductal carcinoma. A breast prognostic profile will be 1 of 2 FINAL for [place], [place] [phone]) Microscopic Comment(continued) performed. Dr. Donato Heinz agrees. The results were called to the [facility] on 04/06/2014. (JDP:kh 04/06/14) Claudette Laws MD Pathologist, Electronic Signature (Case signed 04/06/2014) Specimen Gross and Clinical Information Specimen Comment Suspicious left breast mass In formalin  8:45 Specimen(s) Obtained: Breast, left, needle core biopsy, 9 o'clock Specimen Clinical Information Suspect IDC Gross The specimen is received in formalin labeled "left breast 9 o'clock 5 cm FN", and consists of three cores of tan yellow fibroadipose tissue, ranging from 1.3 x 0.5 x 0.3 cm to 1.7 x 0.3 x 0.2 cm. The specimen is entirely submitted in one cassette. Time in formalin 8:45 a.m. (KL:ds 04/05/14) Stain(s) used in Diagnosis: The following stain(s) were used in diagnosing the case: ER-ACIS, PR-ACIS, CISH, KI-67-ACIS. The control(s) stained appropriately. Disclaimer Her2Neu by CISH (chromogenic in-situ hybridization) is performed at [facility], using the [place] pharmDx Kit (code number [id number]). This test is used to detect the amplification of the Her2Neu gene in interphase nuclei from formalin fixed, paraffin embedded tissue and is reported using ASCO/CAP scoring criteria published in 2013. PR progesterone receptor (PgR 636), immunohistochemical stains are performed on formalin fixed, paraffin embedded tissue using a 3,3"-diaminobenzidine (DAB) chromogen and [employer]AK[employer] Aut[employer]ainer System. The staining intensity of the nucleus is scored morphometrically using the Automated Cellular Imaging System (ACIS) and is reported as the percentage of tumor cell nuclei demonstrating specific nuclear staining. Estrogen receptor (SP1), immunohistochemical stains are performed on formalin fixed, paraffin embedded tissue using a 3,3"-diaminobenzidine (DAB) chromogen and DAK[employer] Autostainer System. The staining intensity of the nucleus is scored morphometrically using the Automated Cellular Imaging System (ACIS) and is reported as the percentage of tumor cell nuclei demonstrating specific nuclear staining. Ki-67 (Mib-1), immunohistochemical stains are performed on formalin fixed, paraffin embedded tissue using a 3,3"-diaminobenzidine (DAB) chromogen and [place]. The staining intensity  of the nucleus is scored morphometrically using the Automated Cellular Imaging System (ACIS) and is reported as the percentage of tumor cell nuclei demonstrating specific nuclear staining. Report signed out from the following location(s) Technical Component performed at [facility]. [address],[place] [address].[id number]., Technical Component performed at [facility] [place], [place], [place] [address]. CLIA #: [id number], Interpretation performed at [facility] [place], [place], [place] [address]. CLIA   FINAL for STEPHANNY, TSUTSUI ([id number]) Patient: LYNSAY, FESPERMAN Collected: 04/20/2014 Client: [facility] Accession: [id number] Received: 04/20/2014 Aviva Signs DOB: 23-Nov-1955 Age: 64 Gender: F Reported: 04/21/2014 [address] Patient Ph: [phone] MRN #: [id number] Linna Hoff  [id number] Visit #: [id number] Chart #: Phone: [phone] Fax: CC: REPORT OF SURGICAL PATHOLOGY ADDITIONAL INFORMATION: 2. CHROMOGENIC IN-SITU HYBRIDIZATION Results: HER-2/NEU BY CISH - NO AMPLIFICATION OF HER-2 DETECTED. RESULT RATIO OF HER2: CEP 17 SIGNALS 1.48 AVERAGE HER2 COPY NUMBER PER CELL 2.00 REFERENCE RANGE NEGATIVE  HER2/Chr17 Ratio <2.0 and Average HER2 copy number <4.0 EQUIVOCAL HER2/Chr17 Ratio <2.0 and Average HER2 copy number 4.0 and <6.0 POSITIVE HER2/Chr17 Ratio >=2.0 and/or Average HER2 copy number >=6.0 Enid Cutter MD Pathologist, Electronic Signature ( Signed 04/27/2014) FINAL DIAGNOSIS Diagnosis 1. Lymph node, sentinel, biopsy, left - TWO BENIGN LYMPH NODES (0/2). 2. Breast, lumpectomy, left - INVASIVE DUCTAL CARCINOMA, 0.9 CM. - MARGINS NOT INVOLVED. Microscopic Comment 2. BREAST, INVASIVE TUMOR, WITH LYMPH NODE SAMPLING Specimen, including laterality and lymph node sampling (sentinel, non-sentinel): Left breast and two sentinel lymph 1 of 3 Duplicate copy FINAL for  Sroka, Leather J ([id number]) Microscopic Comment(continued) nodes Procedure: Lumpectomy and sentinel lymph node biopsy Histologic type: Ductal Grade: I Tubule formation: 1 Nuclear pleomorphism: 1 Mitotic: 1 Tumor size (gross measurement or glass slide measurement): 0.9 cm Margins: Free of tumor Invasive, distance to closest margin: 0.2 cm In-situ, distance to closest margin: N/A If margin positive, focally or broadly: N/A Lymphovascular invasion: Not identified Ductal carcinoma in situ: No Grade: N/A Extensive intraductal component: N/A Lobular neoplasia: No Tumor focality: Unifocal Treatment effect: No If present, treatment effect in breast tissue, lymph nodes or both: N/A Extent of tumor: Skin: N/A Nipple: N/A Skeletal muscle: N/A Lymph nodes: Examined: 2 Sentinel 0 Non-sentinel 2 Total Lymph nodes with metastasis: 0 Isolated tumor cells (< 0.2 mm): 0 Micrometastasis: (> 0.2 mm and < 2.0 mm): 0 Macrometastasis: (> 2.0 mm): 0 Extracapsular extension: N/A Breast prognostic profile: Case [id number] Estrogen receptor: 100%, strong staining Progesterone receptor: 100%, strong staining Her 2 neu: Pending Ki-67: 10% Non-neoplastic breast: Unremarkable. TNM: pT1b, pN0, pMX (JDP:kh 04-21-14) Claudette Laws MD Pathologist, Electronic Signature (Case signed 04/21/2014) Intraoperative Diagnosis 1. LEFT AXILLARY SENTINEL LYMPH NODES (TWO), FROZEN SECTION - TWO LYMPH NODES NEGATIVE FOR TUMOR. (CRR) Total: 1 Specimen Gross and Clinical Information 2 of 3 Duplicate copy FINAL for Perusse, Caidence J ([id number]) Specimen(s) Obtained: 1. Lymph node, sentinel, biopsy, left 2. Breast, lumpectomy, left Specimen Clinical Information 2. left breast cancer Gross 1. Received fresh is tissue, which contains two lymph nodes, which are sampled for RIOC. Rapid Intraoperative Consult performed (Yes or No): Yes Specimen: Left axillary sentinel nodes. Number and size: Two nodes, 0.8 and  1.2 cm. Cut Surface(s): Tan-yellow to red, soft to rubbery, non-blue. Block Summary: Smaller node submitted whole, larger node is bisected and both submitted in one block labeled SLN for frozen section. 2. Specimen type: Left breast lumpectomy, clinically partial mastectomy, in formalin at 10:45 hours. Size: 6.3 x 4.3 x 2 cm Orientation: None, all surfaces are inked black. Skin is not identified. Localized area: There is an inserted wire but no localizing needle. Cut surface: Found within the central specimen is a 0.9 x 0.8 x 0.7 cm gray-white to yellow-red firm ill-defined mass containing a coil clip. Margins: The mass with clip is 0.2 cm from the nearest margin. Prognostic indicators: Not taken at the time of gross. Block summary: A - C = mass, entirely submitted, with nearest margin. D - F = additional margins near mass. Total, six blocks. (SSW:ecj 04/20/2014) Stain(s) used in Diagnosis: The following stain(s) were used in diagnosing the case: CISH. The control(s) stained appropriately. Disclaimer Her2Neu by CISH (chromogenic in-situ hybridization) is performed at [facility] Pathology, using the [place] pharmDx Kit (code number [id number]). This test is used to detect the amplification of the Her2Neu gene in interphase nuclei from formalin fixed, paraffin embedded tissue and is reported using ASCO/CAP scoring criteria published in 2013. Report signed out from the following  location(s) Technical Component was performed at [facility]. [address],[place] [address].[id number]., Technical Component was performed at [facility], [place], [place] [address]. CLIA #: [id number], Interpretation was performed at [facility], [place], [place] [address]. CLIA #: [id number],  IMPRESSION:  #1. Stage I (pT1b pNOMx) infiltrating ductal carcinoma of the left breast, status post lumpectomy and  axillary sentinel node sampling, ER/PR positive, HER-2/neu not overexpressed. #2. Ulcerative colitis, controlled. #3. Hypertension, controlled.   PLAN:  #1. Patient is at risk for local and distant recurrence and definitely will need external beam radiotherapy to the remainder of the left breast. Geographically, [facility] is the closest radiotherapy center. #2. Choice of systemic therapy will depend upon results of Oncotype DX analysis which is pending. If recurrence score is low, patient will not need chemotherapy can be managed effectively with endocrine treatment alone. If recurrence score is intermediate high or high, she would benefit from chemotherapy in addition to endocrine therapy. #3. Baseline lab tests were done today with followup appointment in 2 weeks at which time definitive recommendations will be made based upon the Oncotype DX assay.  I appreciate the opportunity of sharing in her care.   Doroteo Bradford, MD 05/09/2014 7:14 PM   DISCLAIMER:  This note was dictated with voice recognition softwre.  Similar sounding words can inadvertently be transcribed inaccurately and may not be corrected upon review.

## 2014-05-10 LAB — VITAMIN D 25 HYDROXY (VIT D DEFICIENCY, FRACTURES): Vit D, 25-Hydroxy: 39 ng/mL (ref 30–89)

## 2014-05-10 LAB — CANCER ANTIGEN 27.29: CA 27.29: 11 U/mL (ref 0–39)

## 2014-05-10 LAB — CEA: CEA: 1.1 ng/mL (ref 0.0–5.0)

## 2014-05-13 ENCOUNTER — Ambulatory Visit (INDEPENDENT_AMBULATORY_CARE_PROVIDER_SITE_OTHER): Payer: BC Managed Care – PPO | Admitting: Obstetrics & Gynecology

## 2014-05-13 ENCOUNTER — Encounter: Payer: Self-pay | Admitting: Obstetrics & Gynecology

## 2014-05-13 ENCOUNTER — Other Ambulatory Visit (HOSPITAL_COMMUNITY)
Admission: RE | Admit: 2014-05-13 | Discharge: 2014-05-13 | Disposition: A | Discharge status: Home / Self Care | Payer: BC Managed Care – PPO | Source: Ambulatory Visit | Attending: Obstetrics & Gynecology | Admitting: Obstetrics & Gynecology

## 2014-05-13 VITALS — BP 174/90 | Ht 63.5 in | Wt 179.0 lb

## 2014-05-13 DIAGNOSIS — Z01419 Encounter for gynecological examination (general) (routine) without abnormal findings: Secondary | ICD-10-CM | POA: Insufficient documentation

## 2014-05-13 NOTE — Progress Notes (Signed)
Patient ID: Beth Hamilton, female   DOB: 1956-04-23, 58 y.o.   MRN: [id number] Subjective:     Beth Hamilton is a 58 y.o. female here for a routine exam.  No LMP recorded. Patient is postmenopausal. No obstetric history on file. Birth Control Method:  na Menstrual Calendar(currently): na  Current complaints: none.   Current acute medical issues:  Recently diagnosed breast cancer   Recent Gynecologic History No LMP recorded. Patient is postmenopausal. Last Pap: 2014,  normal Last mammogram: 03/2014,  Breast cancer  Past Medical History  Diagnosis Date  . IBD (inflammatory bowel disease)   . Hemorrhoids   . Psoriasis   . Thyroid disease   . Ulcerative colitis 5 1 2007    dr Sydell Axon  . Complication of anesthesia     not sure what med was given in 96 with D&C, was very sore all over. Dr Patsey Berthold did anesthesia. Dr Heide Spark did surgery  . Hypertension   . Hypothyroidism   . Breast cancer 04/20/14    left breast    Past Surgical History  Procedure Laterality Date  . Wisdom tooth extraction    . Colonoscopy  10/09/2001    RMR: Internal hemorrhoids and anal papilla; otherwise normal rectum  . Colonoscopy  12/10/2005    incomplete tcs-  colitis  . Flexible sigmoidoscopy  02/26/2006    endoscopically normal-appearing rectum, colitis in sigmoid mucosa  . Tubal ligation    . Dilation and curettage of uterus  1996    Dr Heide Spark  . Colonoscopy N/A 04/18/2014    Procedure: COLONOSCOPY;  Surgeon: Daneil Dolin, MD;  Location: [facility];  Service: Endoscopy;  Laterality: N/A;  7:30 AM  . Partial mastectomy with needle localization and axillary sentinel lymph node bx Left 04/20/2014    Procedure: PARTIAL MASTECTOMY AFTER NEEDLE LOCALIZATION AND AXILLARY SENTINEL LYMPH NODE BX;  Surgeon: Jamesetta So, MD;  Location: [facility];  Service: General;  Laterality: Left;    OB History   Grav Para Term Preterm Abortions TAB SAB Ect Mult Living                  History   Social History   . Marital Status: Married    Spouse Name: N/A    Number of Children: N/A  . Years of Education: N/A   Social History Main Topics  . Smoking status: Former Smoker -- 0.50 packs/day for 15 years    Types: Cigarettes    Quit date: 07/26/2005  . Smokeless tobacco: Never Used  . Alcohol Use: Yes     Comment: wine every day  . Drug Use: No  . Sexual Activity: Yes    Birth Control/ Protection: Post-menopausal   Other Topics Concern  . None   Social History Narrative  . None    Family History  Problem Relation Age of Onset  . Colon cancer Neg Hx   . Stroke Maternal Grandfather   . Hypertension Mother   . Diabetes Paternal Grandfather   . Heart disease Paternal Grandfather     Current outpatient prescriptions:ARMOUR THYROID 30 MG tablet, Take 30 mg by mouth daily. , Disp: , Rfl: ;  lisinopril (PRINIVIL,ZESTRIL) 10 MG tablet, Take 1 tablet (10 mg total) by mouth daily., Disp: 30 tablet, Rfl: 5;  mesalamine (LIALDA) 1.2 G EC tablet, TAKE 4 TABLETS BY MOUTH DAILY., Disp: 120 tablet, Rfl: 11 HYDROcodone-acetaminophen (NORCO/VICODIN) 5-325 MG per tablet, Take 1-2 tablets by mouth every 4 (four) hours as  needed for moderate pain., Disp: 50 tablet, Rfl: 0  Review of Systems  Review of Systems  Constitutional: Negative for fever, chills, weight loss, malaise/fatigue and diaphoresis.  HENT: Negative for hearing loss, ear pain, nosebleeds, congestion, sore throat, neck pain, tinnitus and ear discharge.   Eyes: Negative for blurred vision, double vision, photophobia, pain, discharge and redness.  Respiratory: Negative for cough, hemoptysis, sputum production, shortness of breath, wheezing and stridor.   Cardiovascular: Negative for chest pain, palpitations, orthopnea, claudication, leg swelling and PND.  Gastrointestinal: negative for abdominal pain. Negative for heartburn, nausea, vomiting, diarrhea, constipation, blood in stool and melena.  Genitourinary: Negative for dysuria, urgency,  frequency, hematuria and flank pain.  Musculoskeletal: Negative for myalgias, back pain, joint pain and falls.  Skin: Negative for itching and rash.  Neurological: Negative for dizziness, tingling, tremors, sensory change, speech change, focal weakness, seizures, loss of consciousness, weakness and headaches.  Endo/Heme/Allergies: Negative for environmental allergies and polydipsia. Does not bruise/bleed easily.  Psychiatric/Behavioral: Negative for depression, suicidal ideas, hallucinations, memory loss and substance abuse. The patient is not nervous/anxious and does not have insomnia.        Objective:  Blood pressure 174/90, height 5' 3.5" (1.613 m), weight 179 lb (81.194 kg).   Physical Exam  Vitals reviewed. Constitutional: She is oriented to person, place, and time. She appears well-developed and well-nourished.  HENT:  Head: Normocephalic and atraumatic.        Right Ear: External ear normal.  Left Ear: External ear normal.  Nose: Nose normal.  Mouth/Throat: Oropharynx is clear and moist.  Eyes: Conjunctivae and EOM are normal. Pupils are equal, round, and reactive to light. Right eye exhibits no discharge. Left eye exhibits no discharge. No scleral icterus.  Neck: Normal range of motion. Neck supple. No tracheal deviation present. No thyromegaly present.  Cardiovascular: Normal rate, regular rhythm, normal heart sounds and intact distal pulses.  Exam reveals no gallop and no friction rub.   No murmur heard. Respiratory: Effort normal and breath sounds normal. No respiratory distress. She has no wheezes. She has no rales. She exhibits no tenderness.  GI: Soft. Bowel sounds are normal. She exhibits no distension and no mass. There is no tenderness. There is no rebound and no guarding.  Genitourinary:  Breasts no masses skin changes or nipple changes bilaterally      Vulva is normal without lesions Vagina is pink moist without discharge Cervix normal in appearance and pap is  done Uterus is normal size shape and contour Adnexa is negative with normal sized ovaries  Rectal   Deferred just had colonoscopy Musculoskeletal: Normal range of motion. She exhibits no edema and no tenderness.  Neurological: She is alert and oriented to person, place, and time. She has normal reflexes. She displays normal reflexes. No cranial nerve deficit. She exhibits normal muscle tone. Coordination normal.  Skin: Skin is warm and dry. No rash noted. No erythema. No pallor.  Psychiatric: She has a normal mood and affect. Her behavior is normal. Judgment and thought content normal.       Assessment:   Normal gyn exam.   Breast cancer Plan:    Follow up in: 1 years.

## 2014-05-16 LAB — CYTOLOGY - PAP

## 2014-05-17 ENCOUNTER — Encounter (HOSPITAL_COMMUNITY): Payer: Self-pay

## 2014-05-24 ENCOUNTER — Encounter (HOSPITAL_COMMUNITY): Payer: Self-pay | Admitting: Lab

## 2014-05-24 ENCOUNTER — Encounter (HOSPITAL_COMMUNITY): Payer: Self-pay

## 2014-05-24 ENCOUNTER — Encounter (HOSPITAL_COMMUNITY): Payer: BC Managed Care – PPO | Attending: Hematology and Oncology

## 2014-05-24 VITALS — BP 151/89 | HR 100 | Temp 99.0°F | Resp 16 | Wt 178.3 lb

## 2014-05-24 DIAGNOSIS — C50512 Malignant neoplasm of lower-outer quadrant of left female breast: Secondary | ICD-10-CM | POA: Insufficient documentation

## 2014-05-24 DIAGNOSIS — Z170 Estrogen receptor positive status [ER+]: Secondary | ICD-10-CM

## 2014-05-24 DIAGNOSIS — I10 Essential (primary) hypertension: Secondary | ICD-10-CM

## 2014-05-24 DIAGNOSIS — C50812 Malignant neoplasm of overlapping sites of left female breast: Secondary | ICD-10-CM

## 2014-05-24 DIAGNOSIS — K5190 Ulcerative colitis, unspecified, without complications: Secondary | ICD-10-CM

## 2014-05-24 MED ORDER — ANASTROZOLE 1 MG PO TABS: 1.0000 mg | ORAL_TABLET | Freq: Every day | ORAL | Status: DC

## 2014-05-24 NOTE — Patient Instructions (Signed)
[facility] Discharge Instructions  RECOMMENDATIONS MADE BY THE CONSULTANT AND ANY TEST RESULTS WILL BE SENT TO YOUR REFERRING PHYSICIAN.  EXAM FINDINGS BY THE PHYSICIAN TODAY AND SIGNS OR SYMPTOMS TO REPORT TO CLINIC OR PRIMARY PHYSICIAN: Exam and findings as discussed by Dr. Barnet Glasgow.  Your Oncotype DX score was low at 7 and you do not need intravenous chemotherapy.  We will get you scheduled for Bone Density and make a referral to [facility] for radiation therapy. You will need to be on Anastrozole for at least 5 years.  This will reduce your risk of recurrence.  Let us know if you have any problems with the medication. Mickie Kay, RN  [phone])   .  MEDICATIONS PRESCRIBED:  Anastrozole - take as directed.  INSTRUCTIONS/FOLLOW-UP: Bone Density Radiation Consult Follow-up in 1 month to see how you are doing.  Thank you for choosing [facility] to provide your oncology and hematology care.  To afford each patient quality time with our providers, please arrive at least 15 minutes before your scheduled appointment time.  With your help, our goal is to use those 15 minutes to complete the necessary work-up to ensure our physicians have the information they need to help with your evaluation and healthcare recommendations.    Effective January 1st, 2014, we ask that you re-schedule your appointment with our physicians should you arrive 10 or more minutes late for your appointment.  We strive to give you quality time with our providers, and arriving late affects you and other patients whose appointments are after yours.    Again, thank you for choosing [facility].  Our hope is that these requests will decrease the amount of time that you wait before being seen by our physicians.       _____________________________________________________________  Should you have questions after your visit to [facility], please  contact our office at [phone] between the hours of 8:30 a.m. and 4:30 p.m.  Voicemails left after 4:30 p.m. will not be returned until the following business day.  For prescription refill requests, have your pharmacy contact our office with your prescription refill request.    _______________________________________________________________  We hope that we have given you very good care.  You may receive a patient satisfaction survey in the mail, please complete it and return it as soon as possible.  We value your feedback!  _______________________________________________________________  Have you asked about our STAR program?  STAR stands for Survivorship Training and Rehabilitation, and this is a nationally recognized cancer care program that focuses on survivorship and rehabilitation.  Cancer and cancer treatments may cause problems, such as, pain, making you feel tired and keeping you from doing the things that you need or want to do. Cancer rehabilitation can help. Our goal is to reduce these troubling effects and help you have the best quality of life possible.  You may receive a survey from a nurse that asks questions about your current state of health.  Based on the survey results, all eligible patients will be referred to the [facility] program for an evaluation so we can better serve you!  A frequently asked questions sheet is available upon request.  Anastrozole tablets What is this medicine? ANASTROZOLE (an AS troe zole) is used to treat breast cancer in women who have gone through menopause. Some types of breast cancer depend on estrogen to grow, and this medicine can stop tumor growth by blocking estrogen  production. This medicine may be used for other purposes; ask your health care provider or pharmacist if you have questions. COMMON BRAND NAME(S): Arimidex What should I tell my health care provider before I take this medicine? They need to know if you have any of these  conditions: -liver disease -an unusual or allergic reaction to anastrozole, other medicines, foods, dyes, or preservatives -pregnant or trying to get pregnant -breast-feeding How should I use this medicine? Take this medicine by mouth with a glass of water. Follow the directions on the prescription label. You can take this medicine with or without food. Take your doses at regular intervals. Do not take your medicine more often than directed. Do not stop taking except on the advice of your doctor or health care professional. Talk to your pediatrician regarding the use of this medicine in children. Special care may be needed. Overdosage: If you think you have taken too much of this medicine contact a poison control center or emergency room at once. NOTE: This medicine is only for you. Do not share this medicine with others. What if I miss a dose? If you miss a dose, take it as soon as you can. If it is almost time for your next dose, take only that dose. Do not take double or extra doses. What may interact with this medicine? Do not take this medicine with any of the following medications: -female hormones, like estrogens or progestins and birth control pills This medicine may also interact with the following medications: -tamoxifen This list may not describe all possible interactions. Give your health care provider a list of all the medicines, herbs, non-prescription drugs, or dietary supplements you use. Also tell them if you smoke, drink alcohol, or use illegal drugs. Some items may interact with your medicine. What should I watch for while using this medicine? Visit your doctor or health care professional for regular checks on your progress. Let your doctor or health care professional know about any unusual vaginal bleeding. Do not treat yourself for diarrhea, nausea, vomiting or other side effects. Ask your doctor or health care professional for advice. What side effects may I notice from  receiving this medicine? Side effects that you should report to your doctor or health care professional as soon as possible: -allergic reactions like skin rash, itching or hives, swelling of the face, lips, or tongue -any new or unusual symptoms -breathing problems -chest pain -leg pain or swelling -vomiting Side effects that usually do not require medical attention (report to your doctor or health care professional if they continue or are bothersome): -back or bone pain -cough, or throat infection -diarrhea or constipation -dizziness -headache -hot flashes -loss of appetite -nausea -sweating -weakness and tiredness -weight gain This list may not describe all possible side effects. Call your doctor for medical advice about side effects. You may report side effects to FDA at 1-800-FDA-1088. Where should I keep my medicine? Keep out of the reach of children. Store at room temperature between 20 and 25 degrees C (68 and 77 degrees F). Throw away any unused medicine after the expiration date. NOTE: This sheet is a summary. It may not cover all possible information. If you have questions about this medicine, talk to your doctor, pharmacist, or health care provider.  2015, [employer]. (2007-09-18 16:31:52)

## 2014-05-24 NOTE — Progress Notes (Signed)
[facility]  OFFICE PROGRESS NOTE  [name], MD [place] [address]  DIAGNOSIS: Breast cancer of lower-outer quadrant of left female breast - Plan: Consult to radiation oncology  Chief Complaint  Patient presents with  . Breast Cancer    CURRENT THERAPY: Lumpectomy and sentinel node biopsy left breast mass. Pap smear 05/13/2014  INTERVAL HISTORY: Beth Hamilton 58 y.o. female returns for follow-up ofleft breast cancer and noninvasive ductal neoplasia of the left breast. On routine mammography an abnormality was found in the left breast. Biopsy was performed on 04/05/2014 at which time a combination of infiltrating ductal carcinoma and noninvasive ductal neoplasia were found, ER 100%, PR 100%. The patient had been scheduled for a followup colonoscopy for colitis which was performed on 04/10/2014 with one area of colitis noted. On 04/20/2014 she underwent left lumpectomy and sentinel he at which time 0.9 cm infiltrating ductal carcinoma was found, ER/PR positive, HER-2/neu not overexpressed with 2 lymph nodes negative. She is here today for discussion of possible additional treatment after Oncotype DX testing was performed yielding a recurrence score of 7.  She is extremely anxious and tearful today. She was given the good news that because of a low recurrence score, she'll not need chemotherapy and can be successfully managed chest with endocrine manipulation. She was told the side effects of the drug could include joint pain as well as muscle aches and vaginal dryness along with hot flashes. She prefers to be treated with radiotherapy in [place] which is close to her work place.she did have a seroma drained from the left breast recently with decrease in size of that region. The seroma itself was not painful.  MEDICAL HISTORY: Past Medical History  Diagnosis Date  . IBD (inflammatory bowel disease)   . Hemorrhoids   .  Psoriasis   . Thyroid disease   . Ulcerative colitis 5 1 2007    dr Sydell Axon  . Complication of anesthesia     not sure what med was given in 96 with D&C, was very sore all over. Dr Patsey Berthold did anesthesia. Dr Heide Spark did surgery  . Hypertension   . Hypothyroidism   . Breast cancer 04/20/14    left breast    INTERIM HISTORY: has HEMORRHOIDS; INFLAMMATORY BOWEL DISEASE; DIARRHEA; ABDOMINAL CRAMPS; HTN (hypertension), benign; and Breast cancer of lower-outer quadrant of left female breast on her problem list.    ALLERGIES:  is allergic to flagyl; other; and levaquin.  MEDICATIONS: has a current medication list which includes the following prescription(s): armour thyroid, hydrocodone-acetaminophen, lisinopril, mesalamine, and anastrozole.  SURGICAL HISTORY:  Past Surgical History  Procedure Laterality Date  . Wisdom tooth extraction    . Colonoscopy  10/09/2001    RMR: Internal hemorrhoids and anal papilla; otherwise normal rectum  . Colonoscopy  12/10/2005    incomplete tcs-  colitis  . Flexible sigmoidoscopy  02/26/2006    endoscopically normal-appearing rectum, colitis in sigmoid mucosa  . Tubal ligation    . Dilation and curettage of uterus  1996    Dr Heide Spark  . Colonoscopy N/A 04/18/2014    Procedure: COLONOSCOPY;  Surgeon: Daneil Dolin, MD;  Location: [facility];  Service: Endoscopy;  Laterality: N/A;  7:30 AM  . Partial mastectomy with needle localization and axillary sentinel lymph node bx Left 04/20/2014    Procedure: PARTIAL MASTECTOMY AFTER NEEDLE LOCALIZATION AND AXILLARY SENTINEL LYMPH NODE BX;  Surgeon: Jeannette How  Arnoldo Morale, MD;  Location: [facility] ORS;  Service: General;  Laterality: Left;    FAMILY HISTORY: family history includes Diabetes in her paternal grandfather; Heart disease in her paternal grandfather; Hypertension in her mother; Stroke in her maternal grandfather. There is no history of Colon cancer.  SOCIAL HISTORY:  reports that she quit smoking about 8 years ago.  Her smoking use included Cigarettes. She has a 7.5 pack-year smoking history. She has never used smokeless tobacco. She reports that she drinks alcohol. She reports that she does not use illicit drugs.  REVIEW OF SYSTEMS:  Other than that discussed above is noncontributory.  PHYSICAL EXAMINATION: ECOG PERFORMANCE STATUS: 1 - Symptomatic but completely ambulatory  Blood pressure 151/89, pulse 100, temperature 99 F (37.2 C), temperature source Oral, resp. rate 16, weight 178 lb 4.8 oz (80.876 kg), SpO2 98 %.  GENERAL:alert, no distress and comfortable SKIN: skin color, texture, turgor are normal, no rashes or significant lesions EYES: PERLA; Conjunctiva are pink and non-injected, sclera clear SINUSES: No redness or tenderness over maxillary or ethmoid sinuses OROPHARYNX:no exudate, no erythema on lips, buccal mucosa, or tongue. NECK: supple, thyroid normal size, non-tender, without nodularity. No masses CHEST: status post left breast lumpectomy with no masses in either breast. LYMPH:  no palpable lymphadenopathy in the cervical, axillary or inguinal LUNGS: clear to auscultation and percussion with normal breathing effort HEART: regular rate & rhythm and no murmurs. ABDOMEN:abdomen soft, non-tender and normal bowel sounds MUSCULOSKELETAL:no cyanosis of digits and no clubbing. Range of motion normal.  NEURO: alert & oriented x 3 with fluent speech, no focal motor/sensory deficits   LABORATORY DATA: Office Visit on 05/13/2014  Component Date Value Ref Range Status  . CYTOLOGY - PAP 05/13/2014 PAP RESULT   Final  Office Visit on 05/09/2014  Component Date Value Ref Range Status  . WBC 05/09/2014 7.4  4.0 - 10.5 K/uL Final  . RBC 05/09/2014 4.17  3.87 - 5.11 MIL/uL Final  . Hemoglobin 05/09/2014 14.0  12.0 - 15.0 g/dL Final  . HCT 05/09/2014 42.6  36.0 - 46.0 % Final  . MCV 05/09/2014 102.2* 78.0 - 100.0 fL Final  . MCH 05/09/2014 33.6  26.0 - 34.0 pg Final  . MCHC 05/09/2014 32.9   30.0 - 36.0 g/dL Final  . RDW 05/09/2014 12.6  11.5 - 15.5 % Final  . Platelets 05/09/2014 PLATELET CLUMPS NOTED ON SMEAR, COUNT APPEARS ADEQUATE  150 - 400 K/uL Final  . Neutrophils Relative % 05/09/2014 53  43 - 77 % Final  . Neutro Abs 05/09/2014 3.9  1.7 - 7.7 K/uL Final  . Lymphocytes Relative 05/09/2014 37  12 - 46 % Final  . Lymphs Abs 05/09/2014 2.7  0.7 - 4.0 K/uL Final  . Monocytes Relative 05/09/2014 7  3 - 12 % Final  . Monocytes Absolute 05/09/2014 0.6  0.1 - 1.0 K/uL Final  . Eosinophils Relative 05/09/2014 2  0 - 5 % Final  . Eosinophils Absolute 05/09/2014 0.2  0.0 - 0.7 K/uL Final  . Basophils Relative 05/09/2014 0  0 - 1 % Final  . Basophils Absolute 05/09/2014 0.0  0.0 - 0.1 K/uL Final  . Sodium 05/09/2014 142  137 - 147 mEq/L Final  . Potassium 05/09/2014 4.5  3.7 - 5.3 mEq/L Final  . Chloride 05/09/2014 103  96 - 112 mEq/L Final  . CO2 05/09/2014 27  19 - 32 mEq/L Final  . Glucose, Bld 05/09/2014 111* 70 - 99 mg/dL Final  . BUN 05/09/2014 18  6 - 23 mg/dL Final  . Creatinine, Ser 05/09/2014 0.64  0.50 - 1.10 mg/dL Final  . Calcium 05/09/2014 10.0  8.4 - 10.5 mg/dL Final  . Total Protein 05/09/2014 7.9  6.0 - 8.3 g/dL Final  . Albumin 05/09/2014 4.3  3.5 - 5.2 g/dL Final  . AST 05/09/2014 28  0 - 37 U/L Final  . ALT 05/09/2014 37* 0 - 35 U/L Final  . Alkaline Phosphatase 05/09/2014 77  39 - 117 U/L Final  . Total Bilirubin 05/09/2014 0.5  0.3 - 1.2 mg/dL Final  . GFR calc non Af Amer 05/09/2014 >90  >90 mL/min Final  . GFR calc Af Amer 05/09/2014 >90  >90 mL/min Final   Comment: (NOTE)                          The eGFR has been calculated using the CKD EPI equation.                          This calculation has not been validated in all clinical situations.                          eGFR's persistently <90 mL/min signify possible Chronic Kidney                          Disease.  . Anion gap 05/09/2014 12  5 - 15 Final  . CEA 05/09/2014 1.1  0.0 - 5.0 ng/mL Final     Performed at [facility]  . CA 27.29 05/09/2014 11  0 - 39 U/mL Final   Performed at [facility]  . Vit D, 25-Hydroxy 05/09/2014 39  30 - 89 ng/mL Final   Comment: (NOTE)                          This assay accurately quantifies Vitamin D, which is the sum of the                          25-Hydroxy forms of Vitamin D2 and D3.  Studies have shown that the                          optimum concentration of 25-Hydroxy Vitamin D is 30 ng/mL or higher.                           Concentrations of Vitamin D between 20 and 29 ng/mL are considered to                          be insufficient and concentrations less than 20 ng/mL are considered                          to be deficient for Vitamin D.                          Performed at [facility]:     [id number]) Patient: Beth Hamilton, Beth Hamilton Collected: 05/13/2014 Client: [facility] Accession: [id number] Received: 05/13/2014 Tania Ade, MD DOB: 1955/10/26 Age: 40 Gender: F Reported: 05/16/2014  [address] Patient Ph: [phone] MRN#: [id number] Linna Hoff [place] [address] Client Acc#: Chart: Phone: [phone] Fax: LMP: Visit#: [id number].[email or web address] CC: GYNECOLOGIC CYTOLOGY REPORT Adequacy Reason Satisfactory for evaluation, endocervical/transformation zone component PRESENT. Diagnosis NEGATIVE FOR INTRAEPITHELIAL LESIONS OR MALIGNANCY. JAMIE BRADY Cytotechnologist Electronic Signature (Case signed 05/16/2014) Source CervicoVaginal Pap [ThinPrep Imaged] Note: The PAP test is a screening test, not a diagnostic procedure and should not be used as the sole means of detecting cervical cancer. The pap technique in the best of hands is subject to both false negative and false positive result at low but significant rates as evidenced by published data. This result should be interpreted in the context of this data, plus your patient's history and clinical findings Report signed out from the  following location(s) Technical Component and Interpretation performed at [facility] [place], [place], [place] [address].          Urinalysis No results found for: COLORURINE, APPEARANCEUR, LABSPEC, PHURINE, GLUCOSEU, HGBUR, BILIRUBINUR, KETONESUR, PROTEINUR, UROBILINOGEN, NITRITE, LEUKOCYTESUR  RADIOGRAPHIC STUDIES: No results found.  ASSESSMENT:  #1. Stage I (pT1b pNOMx) infiltrating ductal carcinoma of the left breast, status post lumpectomy and axillary sentinel node sampling, ER/PR positive, HER-2/neu not overexpressed, Oncotype DX score of 7. #2. Ulcerative colitis, controlled. #3. Hypertension, controlled.    PLAN:  #1. She was referred to [facility] for radiation oncology evaluation and treatment. #2. Anastrozole 1 mg daily was started in an effort to decrease relapse as well as prevent new cancer in the opposite breast. The patient was wanted a side effects including hot flashes, skeletal muscle aches, joint discomfort, and vaginal dryness. #3. Follow-up in one month to assess tolerability.   All questions were answered. The patient knows to call the clinic with any problems, questions or concerns. We can certainly see the patient much sooner if necessary.   I spent 25 minutes counseling the patient face to face. The total time spent in the appointment was 30 minutes.    Doroteo Bradford, MD 05/24/2014 1:22 PM  DISCLAIMER:  This note was dictated with voice recognition software.  Similar sounding words can inadvertently be transcribed inaccurately and may not be corrected upon review.

## 2014-05-24 NOTE — Progress Notes (Signed)
Referral made to [facility] on 11/3

## 2014-05-27 ENCOUNTER — Telehealth: Payer: Self-pay | Admitting: Internal Medicine

## 2014-05-27 MED ORDER — MESALAMINE 1.2 G PO TBEC: DELAYED_RELEASE_TABLET | ORAL | Status: DC

## 2014-05-27 NOTE — Addendum Note (Signed)
Addended by: Mahala Menghini on: 05/27/2014 11:10 AM   Modules accepted: Orders

## 2014-05-27 NOTE — Telephone Encounter (Signed)
Please let patient know I sent in RX since she is requesting a call back. Thanks!

## 2014-05-27 NOTE — Telephone Encounter (Signed)
Patient called today to say that she will need a new prescription of her Lialda in January and could [facility]MR go ahead and send that in to [facility] for her.She said that she had a tcs in September and didn't think RMR would want to see her so soon afterwards to write her a new prescription. I told her that we ask our patients to have their pharmacy send [place] a refill request, but since she still has refills I don't know if they would do that or not. She would like someone to call her today if possible. [phone]

## 2014-05-27 NOTE — Telephone Encounter (Signed)
Routing to refill box

## 2014-05-27 NOTE — Telephone Encounter (Signed)
Pt is aware.  

## 2014-06-07 ENCOUNTER — Ambulatory Visit (HOSPITAL_COMMUNITY)
Admission: RE | Admit: 2014-06-07 | Discharge: 2014-06-07 | Disposition: A | Discharge status: Home / Self Care | Payer: BC Managed Care – PPO | Source: Ambulatory Visit | Attending: Hematology and Oncology | Admitting: Hematology and Oncology

## 2014-06-07 DIAGNOSIS — M810 Age-related osteoporosis without current pathological fracture: Secondary | ICD-10-CM | POA: Insufficient documentation

## 2014-06-07 DIAGNOSIS — Z853 Personal history of malignant neoplasm of breast: Secondary | ICD-10-CM | POA: Insufficient documentation

## 2014-06-07 DIAGNOSIS — C50512 Malignant neoplasm of lower-outer quadrant of left female breast: Secondary | ICD-10-CM

## 2014-06-07 DIAGNOSIS — Z780 Asymptomatic menopausal state: Secondary | ICD-10-CM | POA: Diagnosis present

## 2014-06-09 ENCOUNTER — Encounter: Payer: Self-pay | Admitting: Family Medicine

## 2014-06-09 ENCOUNTER — Ambulatory Visit (INDEPENDENT_AMBULATORY_CARE_PROVIDER_SITE_OTHER): Payer: BC Managed Care – PPO | Admitting: Family Medicine

## 2014-06-09 VITALS — BP 126/82 | Ht 64.0 in | Wt 176.0 lb

## 2014-06-09 DIAGNOSIS — I10 Essential (primary) hypertension: Secondary | ICD-10-CM

## 2014-06-09 MED ORDER — LISINOPRIL 10 MG PO TABS: 10.0000 mg | ORAL_TABLET | Freq: Every day | ORAL | Status: DC

## 2014-06-09 NOTE — Patient Instructions (Addendum)
DASH Eating Plan DASH stands for "Dietary Approaches to Stop Hypertension." The DASH eating plan is a healthy eating plan that has been shown to reduce high blood pressure (hypertension). Additional health benefits may include reducing the risk of type 2 diabetes mellitus, heart disease, and stroke. The DASH eating plan may also help with weight loss. WHAT DO I NEED TO KNOW ABOUT THE DASH EATING PLAN? For the DASH eating plan, you will follow these general guidelines:  Choose foods with a percent daily value for sodium of less than 5% (as listed on the food label).  Use salt-free seasonings or herbs instead of table salt or sea salt.  Check with your health care provider or pharmacist before using salt substitutes.  Eat lower-sodium products, often labeled as "lower sodium" or "no salt added."  Eat fresh foods.  Eat more vegetables, fruits, and low-fat dairy products.  Choose whole grains. Look for the word "whole" as the first word in the ingredient list.  Choose fish and skinless chicken or turkey more often than red meat. Limit fish, poultry, and meat to 6 oz (170 g) each day.  Limit sweets, desserts, sugars, and sugary drinks.  Choose heart-healthy fats.  Limit cheese to 1 oz (28 g) per day.  Eat more home-cooked food and less restaurant, buffet, and fast food.  Limit fried foods.  Cook foods using methods other than frying.  Limit canned vegetables. If you do use them, rinse them well to decrease the sodium.  When eating at a restaurant, ask that your food be prepared with less salt, or no salt if possible. WHAT FOODS CAN I EAT? Seek help from a dietitian for individual calorie needs. Grains Whole grain or whole wheat bread. Brown rice. Whole grain or whole wheat pasta. Quinoa, bulgur, and whole grain cereals. Low-sodium cereals. Corn or whole wheat flour tortillas. Whole grain cornbread. Whole grain crackers. Low-sodium crackers. Vegetables Fresh or frozen vegetables  (raw, steamed, roasted, or grilled). Low-sodium or reduced-sodium tomato and vegetable juices. Low-sodium or reduced-sodium tomato sauce and paste. Low-sodium or reduced-sodium canned vegetables.  Fruits All fresh, canned (in natural juice), or frozen fruits. Meat and Other Protein Products Ground beef (85% or leaner), grass-fed beef, or beef trimmed of fat. Skinless chicken or turkey. Ground chicken or turkey. Pork trimmed of fat. All fish and seafood. Eggs. Dried beans, peas, or lentils. Unsalted nuts and seeds. Unsalted canned beans. Dairy Low-fat dairy products, such as skim or 1% milk, 2% or reduced-fat cheeses, low-fat ricotta or cottage cheese, or plain low-fat yogurt. Low-sodium or reduced-sodium cheeses. Fats and Oils Tub margarines without trans fats. Light or reduced-fat mayonnaise and salad dressings (reduced sodium). Avocado. Safflower, olive, or canola oils. Natural peanut or almond butter. Other Unsalted popcorn and pretzels. The items listed above may not be a complete list of recommended foods or beverages. Contact your dietitian for more options. WHAT FOODS ARE NOT RECOMMENDED? Grains White bread. White pasta. White rice. Refined cornbread. Bagels and croissants. Crackers that contain trans fat. Vegetables Creamed or fried vegetables. Vegetables in a cheese sauce. Regular canned vegetables. Regular canned tomato sauce and paste. Regular tomato and vegetable juices. Fruits Dried fruits. Canned fruit in light or heavy syrup. Fruit juice. Meat and Other Protein Products Fatty cuts of meat. Ribs, chicken wings, bacon, sausage, bologna, salami, chitterlings, fatback, hot dogs, bratwurst, and packaged luncheon meats. Salted nuts and seeds. Canned beans with salt. Dairy Whole or 2% milk, cream, half-and-half, and cream cheese. Whole-fat or sweetened yogurt. Full-fat   cheeses or blue cheese. Nondairy creamers and whipped toppings. Processed cheese, cheese spreads, or cheese  curds. Condiments Onion and garlic salt, seasoned salt, table salt, and sea salt. Canned and packaged gravies. Worcestershire sauce. Tartar sauce. Barbecue sauce. Teriyaki sauce. Soy sauce, including reduced sodium. Steak sauce. Fish sauce. Oyster sauce. Cocktail sauce. Horseradish. Ketchup and mustard. Meat flavorings and tenderizers. Bouillon cubes. Hot sauce. Tabasco sauce. Marinades. Taco seasonings. Relishes. Fats and Oils Butter, stick margarine, lard, shortening, ghee, and bacon fat. Coconut, palm kernel, or palm oils. Regular salad dressings. Other Pickles and olives. Salted popcorn and pretzels. The items listed above may not be a complete list of foods and beverages to avoid. Contact your dietitian for more information. WHERE CAN I FIND MORE INFORMATION? National Heart, Lung, and Blood Institute: www.nhlbi.nih.gov/health/health-topics/topics/dash/ Document Released: 06/27/2011 Document Revised: 11/22/2013 Document Reviewed: 05/12/2013 ExitCare Patient Information 2015 [employer]. This information is not intended to replace advice given to you by your health care provider. Make sure you discuss any questions you have with your health care provider. Hypertension Hypertension, commonly called high blood pressure, is when the force of blood pumping through your arteries is too strong. Your arteries are the blood vessels that carry blood from your heart throughout your body. A blood pressure reading consists of a higher number over a lower number, such as 110/72. The higher number (systolic) is the pressure inside your arteries when your heart pumps. The lower number (diastolic) is the pressure inside your arteries when your heart relaxes. Ideally you want your blood pressure below 120/80. Hypertension forces your heart to work harder to pump blood. Your arteries may become narrow or stiff. Having hypertension puts you at risk for heart disease, stroke, and other problems.  RISK  FACTORS Some risk factors for high blood pressure are controllable. Others are not.  Risk factors you cannot control include:   Race. You may be at higher risk if you are African American.  Age. Risk increases with age.  Gender. Men are at higher risk than women before age 45 years. After age 65, women are at higher risk than men. Risk factors you can control include:  Not getting enough exercise or physical activity.  Being overweight.  Getting too much fat, sugar, calories, or salt in your diet.  Drinking too much alcohol. SIGNS AND SYMPTOMS Hypertension does not usually cause signs or symptoms. Extremely high blood pressure (hypertensive crisis) may cause headache, anxiety, shortness of breath, and nosebleed. DIAGNOSIS  To check if you have hypertension, your health care provider will measure your blood pressure while you are seated, with your arm held at the level of your heart. It should be measured at least twice using the same arm. Certain conditions can cause a difference in blood pressure between your right and left arms. A blood pressure reading that is higher than normal on one occasion does not mean that you need treatment. If one blood pressure reading is high, ask your health care provider about having it checked again. TREATMENT  Treating high blood pressure includes making lifestyle changes and possibly taking medicine. Living a healthy lifestyle can help lower high blood pressure. You may need to change some of your habits. Lifestyle changes may include:  Following the DASH diet. This diet is high in fruits, vegetables, and whole grains. It is low in salt, red meat, and added sugars.  Getting at least 2 hours of brisk physical activity every week.  Losing weight if necessary.  Not smoking.  Limiting   alcoholic beverages.  Learning ways to reduce stress. If lifestyle changes are not enough to get your blood pressure under control, your health care provider may  prescribe medicine. You may need to take more than one. Work closely with your health care provider to understand the risks and benefits. HOME CARE INSTRUCTIONS  Have your blood pressure rechecked as directed by your health care provider.   Take medicines only as directed by your health care provider. Follow the directions carefully. Blood pressure medicines must be taken as prescribed. The medicine does not work as well when you skip doses. Skipping doses also puts you at risk for problems.   Do not smoke.   Monitor your blood pressure at home as directed by your health care provider. SEEK MEDICAL CARE IF:   You think you are having a reaction to medicines taken.  You have recurrent headaches or feel dizzy.  You have swelling in your ankles.  You have trouble with your vision. SEEK IMMEDIATE MEDICAL CARE IF:  You develop a severe headache or confusion.  You have unusual weakness, numbness, or feel faint.  You have severe chest or abdominal pain.  You vomit repeatedly.  You have trouble breathing. MAKE SURE YOU:   Understand these instructions.  Will watch your condition.  Will get help right away if you are not doing well or get worse. Document Released: 07/08/2005 Document Revised: 11/22/2013 Document Reviewed: 04/30/2013 ExitCare Patient Information 2015 Exit[employer], [employer]. This information is not intended to replace advice given to you by your health care provider. Make sure you discuss any questions you have with your health care provider.  

## 2014-06-09 NOTE — Progress Notes (Signed)
   Subjective:    Patient ID: Beth Hamilton, female    DOB: [birth year], 58 y.o.   MRN: [id number]  Hypertension This is a chronic problem. The problem is controlled. Pertinent negatives include no chest pain or shortness of breath.   No new concerns. Long discussion held regarding activity watching salt taking medication  Review of Systems  Constitutional: Negative for activity change, appetite change and fatigue.  HENT: Negative for congestion.   Respiratory: Negative for cough and shortness of breath.   Cardiovascular: Negative for chest pain.  Endocrine: Negative for polydipsia and polyphagia.  Genitourinary: Negative for frequency.  Neurological: Negative for weakness.  Psychiatric/Behavioral: Negative for confusion.       Objective:   Physical Exam  Constitutional: She appears well-nourished. No distress.  Cardiovascular: Normal rate, regular rhythm and normal heart sounds.   No murmur heard. Pulmonary/Chest: Effort normal and breath sounds normal. No respiratory distress.  Musculoskeletal: She exhibits no edema.  Lymphadenopathy:    She has no cervical adenopathy.  Neurological: She is alert. She exhibits normal muscle tone.  Psychiatric: Her behavior is normal.  Vitals reviewed.         Assessment & Plan:  Blood pressure overall decent control continue current measures follow-up 6 months  Breast cancer followed by specialist prognosis good

## 2014-06-23 ENCOUNTER — Encounter (HOSPITAL_COMMUNITY): Payer: BC Managed Care – PPO | Attending: Hematology and Oncology

## 2014-06-23 ENCOUNTER — Encounter (HOSPITAL_COMMUNITY): Payer: Self-pay

## 2014-06-23 VITALS — BP 136/85 | HR 95 | Temp 98.6°F | Resp 16 | Wt 176.3 lb

## 2014-06-23 DIAGNOSIS — C50512 Malignant neoplasm of lower-outer quadrant of left female breast: Secondary | ICD-10-CM | POA: Insufficient documentation

## 2014-06-23 DIAGNOSIS — C50812 Malignant neoplasm of overlapping sites of left female breast: Secondary | ICD-10-CM

## 2014-06-23 DIAGNOSIS — C50912 Malignant neoplasm of unspecified site of left female breast: Secondary | ICD-10-CM

## 2014-06-23 DIAGNOSIS — Z170 Estrogen receptor positive status [ER+]: Secondary | ICD-10-CM

## 2014-06-23 DIAGNOSIS — I10 Essential (primary) hypertension: Secondary | ICD-10-CM

## 2014-06-23 DIAGNOSIS — K529 Noninfective gastroenteritis and colitis, unspecified: Secondary | ICD-10-CM

## 2014-06-23 NOTE — Progress Notes (Signed)
[facility]  OFFICE PROGRESS NOTE  [name], [place] [id number]  DIAGNOSIS: Breast cancer, left  Essential hypertension  Colitis  Chief Complaint  Patient presents with  . Breast Cancer    CURRENT THERAPY: Radiotherapy, 2 treatments thus far to the left breast along with anastrozole 1 mg daily started on 05/24/2014 having undergone lumpectomy and sentinel node biopsy on 04/20/2014.  INTERVAL HISTORY: Beth Hamilton 58 y.o. female returns for follow-up of stage I left breast cancer. On routine mammography an abnormality was found in the left breast. Biopsy was performed on 04/05/2014 at which time a combination of infiltrating ductal carcinoma and noninvasive ductal neoplasia were found, ER 100%, PR 100%. The patient had been scheduled for a followup colonoscopy for colitis which was performed on 04/10/2014 with one area of colitis noted. On 04/20/2014 she underwent left lumpectomy and sentinel he at which time 0.9 cm infiltrating ductal carcinoma was found, ER/PR positive, HER-2/neu not overexpressed with 2 lymph nodes negative. She is here today for discussion of possible additional treatment after Oncotype DX testing was performed yielding a recurrence score of 7.  She was started on anastrozole 1 mg daily on 05/24/2014 and was referred to radiation oncology, having received 2 treatments thus far to the left breast. Patient is tolerating anastrozole well with no hot flashes, vaginal dryness, muscle aches, or joint pain. Appetite is good with no nausea, vomiting, diarrhea, constipation, dysuria, hematuria, lower extremity swelling or redness, lymphedema, skin rash, headache, or seizures.  MEDICAL HISTORY: Past Medical History  Diagnosis Date  . IBD (inflammatory bowel disease)   . Hemorrhoids   . Psoriasis   . Thyroid disease   . Ulcerative colitis 5 1 2007    dr Sydell Axon  . Complication of anesthesia     not  sure what med was given in 96 with D&C, was very sore all over. Dr Patsey Berthold did anesthesia. Dr Heide Spark did surgery  . Hypertension   . Hypothyroidism   . Breast cancer 04/20/14    left breast    INTERIM HISTORY: has HEMORRHOIDS; INFLAMMATORY BOWEL DISEASE; DIARRHEA; ABDOMINAL CRAMPS; HTN (hypertension), benign; and Breast cancer of lower-outer quadrant of left female breast on her problem list.    ALLERGIES:  is allergic to flagyl; other; and levaquin.  MEDICATIONS: has a current medication list which includes the following prescription(s): anastrozole, armour thyroid, lisinopril, mesalamine, and hydrocodone-acetaminophen.  SURGICAL HISTORY:  Past Surgical History  Procedure Laterality Date  . Wisdom tooth extraction    . Colonoscopy  10/09/2001    RMR: Internal hemorrhoids and anal papilla; otherwise normal rectum  . Colonoscopy  12/10/2005    incomplete tcs-  colitis  . Flexible sigmoidoscopy  02/26/2006    endoscopically normal-appearing rectum, colitis in sigmoid mucosa  . Tubal ligation    . Dilation and curettage of uterus  1996    Dr Heide Spark  . Colonoscopy N/A 04/18/2014    Procedure: COLONOSCOPY;  Surgeon: Daneil Dolin, MD;  Location: [facility];  Service: Endoscopy;  Laterality: N/A;  7:30 AM  . Partial mastectomy with needle localization and axillary sentinel lymph node bx Left 04/20/2014    Procedure: PARTIAL MASTECTOMY AFTER NEEDLE LOCALIZATION AND AXILLARY SENTINEL LYMPH NODE BX;  Surgeon: Jamesetta So, MD;  Location: [facility];  Service: General;  Laterality: Left;    FAMILY HISTORY: family history includes Diabetes in her paternal grandfather; Heart disease in her  paternal grandfather; Hypertension in her mother; Stroke in her maternal grandfather. There is no history of Colon cancer.  SOCIAL HISTORY:  reports that she quit smoking about 8 years ago. Her smoking use included Cigarettes. She has a 7.5 pack-year smoking history. She has never used smokeless tobacco. She  reports that she drinks alcohol. She reports that she does not use illicit drugs.  REVIEW OF SYSTEMS:  Other than that discussed above is noncontributory.  PHYSICAL EXAMINATION: ECOG PERFORMANCE STATUS: 1 - Symptomatic but completely ambulatory  Blood pressure 136/85, pulse 95, temperature 98.6 F (37 C), temperature source Oral, resp. rate 16, weight 176 lb 4.8 oz (79.969 kg), SpO2 98 %.  GENERAL:alert, no distress and comfortable SKIN: skin color, texture, turgor are normal, no rashes or significant lesions EYES: PERLA; Conjunctiva are pink and non-injected, sclera clear SINUSES: No redness or tenderness over maxillary or ethmoid sinuses OROPHARYNX:no exudate, no erythema on lips, buccal mucosa, or tongue. NECK: supple, thyroid normal size, non-tender, without nodularity. No masses CHEST: Status post left breast lumpectomy with no masses in either breast. LYMPH:  no palpable lymphadenopathy in the cervical, axillary or inguinal LUNGS: clear to auscultation and percussion with normal breathing effort HEART: regular rate & rhythm and no murmurs. ABDOMEN:abdomen soft, non-tender and normal bowel sounds MUSCULOSKELETAL:no cyanosis of digits and no clubbing. Range of motion normal.  NEURO: alert & oriented x 3 with fluent speech, no focal motor/sensory deficits   LABORATORY DATA: No visits with results within 30 Day(s) from this visit. Latest known visit with results is:  Office Visit on 05/13/2014  Component Date Value Ref Range Status  . CYTOLOGY - PAP 05/13/2014 PAP RESULT   Final    PATHOLOGY: No new pathology.  Urinalysis No results found for: COLORURINE, APPEARANCEUR, LABSPEC, [place], GLUCOSEU, HGBUR, BILIRUBINUR, KETONESUR, PROTEINUR, UROBILINOGEN, NITRITE, LEUKOCYTESUR  RADIOGRAPHIC STUDIES: Dg Bone Density  06/07/2014   EXAM: DUAL X-RAY ABSORPTIOMETRY (DXA) FOR BONE MINERAL DENSITY  IMPRESSION: Ordering Physician:  Dr. Farrel Gobble,  Your patient Beth Hamilton  completed a BMD test on 06/07/2014 using the [place] (software version: 14.10) manufactured by [employer]. The following summarizes the results of our evaluation. PATIENT BIOGRAPHICAL: Name: Beth Hamilton, Beth Hamilton Patient ID: [id number] Birth Date: [birth year] Height: 63.5 in. Gender: Female Exam Date: 06/07/2014 Weight: 178.0 lbs. Indications: Caucasian, Height Loss, Hx Breast Ca, Low Calcium Intake, Parent Hip Fracture, Post Menopausal Fractures: Treatments: Aromatase DENSITOMETRY RESULTS: Site      Region    Measured Date Measured Age WHO Classification Young Adult T-score BMD         %Change vs. Previous Significant Change (*) [facility] Spine L1-L4 06/07/2014 55.8 Osteoporosis -2.7 0.862 g/cm2  DualFemur Neck Left 06/07/2014 55.8 Osteopenia -2.0 0.754 g/cm2 ASSESSMENT: BMD as determined from [facility] Spine L1-L4 is 0.862 g/cm2 with a T-Score of -2.7. This patient is considered osteoporotic according to [facility]) criteria. (Patient does not meet criteria for FRAX assessment.)  World Health Organization [facility]) criteria for post-menopausal, Caucasian Women: Normal:       T-score at or above -1 SD Osteopenia:   T-score between -1 and -2.5 SD Osteoporosis: T-score at or below -2.5 SD  RECOMMENDATIONS: [place] recommends that FDA-approved medial therapies be considered in postmenopausal women and men age 67 or older with a: 1. Hip or vertberal (clinical or morphometric) fracture. 2. T-Score of < -2.5 at the spine or hip. 3. Ten-year fracture probability by FRAX of 3% or greater for hip  fracture or 20% or greater for major osteoporotic fracture.  All treatment decisions require clinical judgment and consideration of indiviual patient factors, including patient preferences, co-morbidities, previous drug use, risk factors not captured in the FRAX model (e.g. falls, vitamin D deficiency, increased bone turnover, interval significant decline in bone density) and  possible under-or over-estimation of fracture risk by FRAX.  All patients should ensure an adequate intake of dietary calcium (1200 mg/d) and vitamin D (800 IU daily) unless contraindicated.  FOLLOW-UP: People with diagnosed cases of osteoporosis or osteopenia should be regularly tested for bone mineral density. For patients eligible for Medicare, routine testing is allowed once every 2 years. Testing frequency can be increased for patients who have rapidly progressing disease, or for those who are receiving medical therapy to restore bone mass.  I have reviewed this report, and agree with the above findings.  [facility]   Electronically Signed   By: Lajean Manes M.D.   On: 06/07/2014 09:14    ASSESSMENT:  #1. Stage I (pT1b pNOMx) infiltrating ductal carcinoma of the left breast, status post lumpectomy and axillary sentinel node sampling, ER/PR positive, HER-2/neu not overexpressed, Oncotype DX score of 7, tolerating anastrozole well, currently undergoing radiotherapy.  #2. Ulcerative colitis, controlled. #3. Hypertension, controlled.   PLAN:  #1. Continue anastrozole 1 mg daily. #2. Complete radiotherapy treatments. #3. Follow-up in 3 months with no labs.   All questions were answered. The patient knows to call the clinic with any problems, questions or concerns. We can certainly see the patient much sooner if necessary.   I spent 25 minutes counseling the patient face to face. The total time spent in the appointment was 30 minutes.    Doroteo Bradford, MD 06/23/2014 2:14 PM  DISCLAIMER:  This note was dictated with voice recognition software.  Similar sounding words can inadvertently be transcribed inaccurately and may not be corrected upon review.

## 2014-06-23 NOTE — Patient Instructions (Signed)
[facility] Discharge Instructions  RECOMMENDATIONS MADE BY THE CONSULTANT AND ANY TEST RESULTS WILL BE SENT TO YOUR REFERRING PHYSICIAN.  EXAM FINDINGS BY THE PHYSICIAN TODAY AND SIGNS OR SYMPTOMS TO REPORT TO CLINIC OR PRIMARY PHYSICIAN: Exam and findings as discussed by Dr. Barnet Glasgow.  You are doing well. Report any new lumps, bone pain, shortness of breath or other symptoms.  MEDICATIONS PRESCRIBED:  Continue anastrozole  INSTRUCTIONS/FOLLOW-UP: Follow-up in 3 months.  Thank you for choosing [facility] to provide your oncology and hematology care.  To afford each patient quality time with our providers, please arrive at least 15 minutes before your scheduled appointment time.  With your help, our goal is to use those 15 minutes to complete the necessary work-up to ensure our physicians have the information they need to help with your evaluation and healthcare recommendations.    Effective January 1st, 2014, we ask that you re-schedule your appointment with our physicians should you arrive 10 or more minutes late for your appointment.  We strive to give you quality time with our providers, and arriving late affects you and other patients whose appointments are after yours.    Again, thank you for choosing [facility].  Our hope is that these requests will decrease the amount of time that you wait before being seen by our physicians.       _____________________________________________________________  Should you have questions after your visit to [facility], please contact our office at [phone] between the hours of 8:30 a.m. and 4:30 p.m.  Voicemails left after 4:30 p.m. will not be returned until the following business day.  For prescription refill requests, have your pharmacy contact our office with your prescription refill request.    _______________________________________________________________  We hope that we have  given you very good care.  You may receive a patient satisfaction survey in the mail, please complete it and return it as soon as possible.  We value your feedback!  _______________________________________________________________  Have you asked about our STAR program?  STAR stands for Survivorship Training and Rehabilitation, and this is a nationally recognized cancer care program that focuses on survivorship and rehabilitation.  Cancer and cancer treatments may cause problems, such as, pain, making you feel tired and keeping you from doing the things that you need or want to do. Cancer rehabilitation can help. Our goal is to reduce these troubling effects and help you have the best quality of life possible.  You may receive a survey from a nurse that asks questions about your current state of health.  Based on the survey results, all eligible patients will be referred to the [facility] program for an evaluation so we can better serve you!  A frequently asked questions sheet is available upon request.

## 2014-07-01 LAB — LIPID PANEL
Cholesterol: 206 mg/dL — ABNORMAL HIGH (ref 0–200)
HDL: 49 mg/dL (ref >39)
LDL Cholesterol: 127 mg/dL — ABNORMAL HIGH (ref 0–99)
TRIGLYCERIDES: 149 mg/dL (ref <150)
Total CHOL/HDL Ratio: 4.2 Ratio
VLDL: 30 mg/dL (ref 0–40)

## 2014-07-02 ENCOUNTER — Encounter: Payer: Self-pay | Admitting: Family Medicine

## 2014-08-29 ENCOUNTER — Other Ambulatory Visit: Payer: Self-pay | Admitting: Obstetrics and Gynecology

## 2014-08-30 ENCOUNTER — Telehealth: Payer: Self-pay | Admitting: Obstetrics and Gynecology

## 2014-09-06 NOTE — Telephone Encounter (Signed)
Pt stated that everything had been taken care of.

## 2014-09-20 ENCOUNTER — Encounter: Payer: Self-pay | Admitting: Family Medicine

## 2014-09-20 ENCOUNTER — Ambulatory Visit (INDEPENDENT_AMBULATORY_CARE_PROVIDER_SITE_OTHER): Payer: BLUE CROSS/BLUE SHIELD | Admitting: Family Medicine

## 2014-09-20 VITALS — BP 114/80 | Temp 99.0°F | Ht 64.0 in | Wt 180.2 lb

## 2014-09-20 DIAGNOSIS — J040 Acute laryngitis: Secondary | ICD-10-CM

## 2014-09-20 MED ORDER — AZITHROMYCIN 250 MG PO TABS: ORAL_TABLET | ORAL | Status: DC

## 2014-09-20 NOTE — Progress Notes (Signed)
   Subjective:    Patient ID: Beth Hamilton, female    DOB: 21-Jan-1956, 59 y.o.   MRN: [id number]  Sore Throat  This is a new problem. The current episode started in the past 7 days. The problem has been unchanged. Neither side of throat is experiencing more pain than the other. There has been no fever. The pain is at a severity of 0/10. The patient is experiencing no pain. Associated symptoms include congestion and a hoarse voice. Pertinent negatives include no coughing, ear pain or shortness of breath. She has tried NSAIDs for the symptoms. The treatment provided mild relief.   Patient states that she has no other concerns at this time.   PMH benign former smoker  Review of Systems  Constitutional: Negative for fever and activity change.  HENT: Positive for congestion, hoarse voice and voice change. Negative for ear pain and rhinorrhea.   Eyes: Negative for discharge.  Respiratory: Negative for cough, shortness of breath and wheezing.   Cardiovascular: Negative for chest pain.       Objective:   Physical Exam  her throat appears normal in ears appear normal sinus nontender neck is supple there is no masses felt her lungs are clear hearts regular she does have some hoarseness and evidence of postnasal drainage with coughing noted       Assessment & Plan:   hoarseness-we will go ahead with Zithromax. If her symptoms have not gone when the next 2 weeks she is notify us we'll set her up with ENT to they can visualize her vocal cords. She does not have any cardinal signs of cancer currently patient is not smoking currently but does have a hstory of smoking.

## 2014-09-22 ENCOUNTER — Encounter: Payer: Self-pay | Admitting: Internal Medicine

## 2014-09-22 ENCOUNTER — Encounter (HOSPITAL_COMMUNITY): Payer: Self-pay | Admitting: Hematology & Oncology

## 2014-09-22 ENCOUNTER — Encounter (HOSPITAL_COMMUNITY): Payer: BLUE CROSS/BLUE SHIELD | Attending: Hematology and Oncology | Admitting: Hematology & Oncology

## 2014-09-22 VITALS — BP 155/95 | HR 100 | Temp 98.2°F | Resp 20 | Wt 179.5 lb

## 2014-09-22 DIAGNOSIS — Z79810 Long term (current) use of selective estrogen receptor modulators (SERMs): Secondary | ICD-10-CM | POA: Diagnosis not present

## 2014-09-22 DIAGNOSIS — Z853 Personal history of malignant neoplasm of breast: Secondary | ICD-10-CM

## 2014-09-22 DIAGNOSIS — M810 Age-related osteoporosis without current pathological fracture: Secondary | ICD-10-CM

## 2014-09-22 DIAGNOSIS — E039 Hypothyroidism, unspecified: Secondary | ICD-10-CM | POA: Diagnosis not present

## 2014-09-22 DIAGNOSIS — C50512 Malignant neoplasm of lower-outer quadrant of left female breast: Secondary | ICD-10-CM | POA: Insufficient documentation

## 2014-09-22 NOTE — Patient Instructions (Signed)
[place] at [facility] Discharge Instructions  RECOMMENDATIONS MADE BY THE CONSULTANT AND ANY TEST RESULTS WILL BE SENT TO YOUR REFERRING PHYSICIAN.  Dr.Penland has recommended that you take Calcium 1200 mg daily plus Vitamin D 1000 units daily. We will plan to start Prolia injections when you return in 3 months. Information provided. Labs in 3 months and MD appointment in 3 months. Return as scheduled. Denosumab injection What is this medicine? DENOSUMAB (den oh sue mab) slows bone breakdown. Prolia is used to treat osteoporosis in women after menopause and in men. Delton See is used to prevent bone fractures and other bone problems caused by cancer bone metastases. Delton See is also used to treat giant cell tumor of the bone. This medicine may be used for other purposes; ask your health care provider or pharmacist if you have questions. COMMON BRAND NAME(S): Prolia, XGEVA What should I tell my health care provider before I take this medicine? They need to know if you have any of these conditions: -dental disease -eczema -infection or history of infections -kidney disease or on dialysis -low blood calcium or vitamin D -malabsorption syndrome -scheduled to have surgery or tooth extraction -taking medicine that contains denosumab -thyroid or parathyroid disease -an unusual reaction to denosumab, other medicines, foods, dyes, or preservatives -pregnant or trying to get pregnant -breast-feeding How should I use this medicine? This medicine is for injection under the skin. It is given by a health care professional in a hospital or clinic setting. If you are getting Prolia, a special MedGuide will be given to you by the pharmacist with each prescription and refill. Be sure to read this information carefully each time. For Prolia, talk to your pediatrician regarding the use of this medicine in children. Special care may be needed. For Delton See, talk to your pediatrician  regarding the use of this medicine in children. While this drug may be prescribed for children as young as 13 years for selected conditions, precautions do apply. Overdosage: If you think you've taken too much of this medicine contact a poison control center or emergency room at once. Overdosage: If you think you have taken too much of this medicine contact a poison control center or emergency room at once. NOTE: This medicine is only for you. Do not share this medicine with others. What if I miss a dose? It is important not to miss your dose. Call your doctor or health care professional if you are unable to keep an appointment. What may interact with this medicine? Do not take this medicine with any of the following medications: -other medicines containing denosumab This medicine may also interact with the following medications: -medicines that suppress the immune system -medicines that treat cancer -steroid medicines like prednisone or cortisone This list may not describe all possible interactions. Give your health care provider a list of all the medicines, herbs, non-prescription drugs, or dietary supplements you use. Also tell them if you smoke, drink alcohol, or use illegal drugs. Some items may interact with your medicine. What should I watch for while using this medicine? Visit your doctor or health care professional for regular checks on your progress. Your doctor or health care professional may order blood tests and other tests to see how you are doing. Call your doctor or health care professional if you get a cold or other infection while receiving this medicine. Do not treat yourself. This medicine may decrease your body's ability to fight infection. You should make sure you get enough  calcium and vitamin D while you are taking this medicine, unless your doctor tells you not to. Discuss the foods you eat and the vitamins you take with your health care professional. See your dentist  regularly. Brush and floss your teeth as directed. Before you have any dental work done, tell your dentist you are receiving this medicine. Do not become pregnant while taking this medicine or for 5 months after stopping it. Women should inform their doctor if they wish to become pregnant or think they might be pregnant. There is a potential for serious side effects to an unborn child. Talk to your health care professional or pharmacist for more information. What side effects may I notice from receiving this medicine? Side effects that you should report to your doctor or health care professional as soon as possible: -allergic reactions like skin rash, itching or hives, swelling of the face, lips, or tongue -breathing problems -chest pain -fast, irregular heartbeat -feeling faint or lightheaded, falls -fever, chills, or any other sign of infection -muscle spasms, tightening, or twitches -numbness or tingling -skin blisters or bumps, or is dry, peels, or red -slow healing or unexplained pain in the mouth or jaw -unusual bleeding or bruising Side effects that usually do not require medical attention (Report these to your doctor or health care professional if they continue or are bothersome.): -muscle pain -stomach upset, gas This list may not describe all possible side effects. Call your doctor for medical advice about side effects. You may report side effects to FDA at 1-800-FDA-1088. Where should I keep my medicine? This medicine is only given in a clinic, doctor's office, or other health care setting and will not be stored at home. NOTE: This sheet is a summary. It may not cover all possible information. If you have questions about this medicine, talk to your doctor, pharmacist, or health care provider.  2015, Elsevier/Gold Standard. (2012-01-06 12:37:47)   Thank you for choosing [place] at [facility] to provide your oncology and hematology care.  To afford each  patient quality time with our provider, please arrive at least 15 minutes before your scheduled appointment time.    You need to re-schedule your appointment should you arrive 10 or more minutes late.  We strive to give you quality time with our providers, and arriving late affects you and other patients whose appointments are after yours.  Also, if you no show three or more times for appointments you may be dismissed from the clinic at the providers discretion.     Again, thank you for choosing [facility].  Our hope is that these requests will decrease the amount of time that you wait before being seen by our physicians.       _____________________________________________________________  Should you have questions after your visit to [facility], please contact our office at [phone] between the hours of 8:30 a.m. and 4:30 p.m.  Voicemails left after 4:30 p.m. will not be returned until the following business day.  For prescription refill requests, have your pharmacy contact our office.

## 2014-09-22 NOTE — Progress Notes (Signed)
Beth Lange, MD [place] [address]    DIAGNOSIS: Breast cancer of lower-outer quadrant of left female breast   Staging form: Breast, AJCC 7th Edition     Clinical: No stage assigned - Unsigned  Stage I (pT1b pNOMx) infiltrating ductal carcinoma of the left breast, status post lumpectomy and axillary sentinel node sampling, ER/PR positive, HER-2/neu not overexpressed, Oncotype DX score of 7, adjuvant XRT. On Arimidex.  Biopsy was performed on 04/05/2014 at which time a combination of infiltrating ductal carcinoma and noninvasive ductal neoplasia were found, ER 100%, PR 100%  Ulcerative colitis, controlled.  DEXA 05/2014 with osteoporosis COLONOSCOPY 04/18/2014  SUMMARY OF ONCOLOGIC HISTORY:   Breast cancer of lower-outer quadrant of left female breast   05/24/2014 Initial Diagnosis Breast cancer of lower-outer quadrant of left female breast   06/22/2014 - 08/05/2014 Radiation Therapy Left breast 50 Gy at 2 Gy per fraction x 25 fractions with left breast boost 10 Gy at 2 Gy per fraction x 5 fractions by Dr. Pablo Ledger.    CURRENT THERAPY: Arimidex  INTERVAL HISTORY: Beth Hamilton 59 y.o. female returns for follow-up of a stage I Left breast cancer. She is currently on Arimidex. DEXA scan performed in November 2015 showed osteoporosis. He is not on calcium or vitamin D. She is not on a bisphosphonate or Prolia. She has no major complaints today. She states for the most part she feels well.  MEDICAL HISTORY: Past Medical History  Diagnosis Date  . IBD (inflammatory bowel disease)   . Hemorrhoids   . Psoriasis   . Thyroid disease   . Ulcerative colitis 5 1 2007    dr Sydell Axon  . Complication of anesthesia     not sure what med was given in 96 with D&C, was very sore all over. Dr Patsey Berthold did anesthesia. Dr Heide Spark did surgery  . Hypertension   . Hypothyroidism   . Breast cancer 04/20/14    left breast    has HEMORRHOIDS; INFLAMMATORY BOWEL  DISEASE; DIARRHEA; ABDOMINAL CRAMPS; HTN (hypertension), benign; and Breast cancer of lower-outer quadrant of left female breast on her problem list.     is allergic to flagyl; other; and levaquin.  Beth Hamilton does not currently have medications on file.  SURGICAL HISTORY: Past Surgical History  Procedure Laterality Date  . Wisdom tooth extraction    . Colonoscopy  10/09/2001    RMR: Internal hemorrhoids and anal papilla; otherwise normal rectum  . Colonoscopy  12/10/2005    incomplete tcs-  colitis  . Flexible sigmoidoscopy  02/26/2006    endoscopically normal-appearing rectum, colitis in sigmoid mucosa  . Tubal ligation    . Dilation and curettage of uterus  1996    Dr Heide Spark  . Colonoscopy N/A 04/18/2014    Procedure: COLONOSCOPY;  Surgeon: Daneil Dolin, MD;  Location: [facility];  Service: Endoscopy;  Laterality: N/A;  7:30 AM  . Partial mastectomy with needle localization and axillary sentinel lymph node bx Left 04/20/2014    Procedure: PARTIAL MASTECTOMY AFTER NEEDLE LOCALIZATION AND AXILLARY SENTINEL LYMPH NODE BX;  Surgeon: Jamesetta So, MD;  Location: [facility];  Service: General;  Laterality: Left;    SOCIAL HISTORY: History   Social History  . Marital Status: Married    Spouse Name: N/A  . Number of Children: N/A  . Years of Education: N/A   Occupational History  . Not on file.   Social History Main Topics  . Smoking status:  Former Smoker -- 0.50 packs/day for 15 years    Types: Cigarettes    Quit date: 07/26/2005  . Smokeless tobacco: Never Used  . Alcohol Use: Yes     Comment: wine every day  . Drug Use: No  . Sexual Activity: Yes    Birth Control/ Protection: Post-menopausal   Other Topics Concern  . Not on file   Social History Narrative    FAMILY HISTORY: Family History  Problem Relation Age of Onset  . Colon cancer Neg Hx   . Stroke Maternal Grandfather   . Hypertension Mother   . Diabetes Paternal Grandfather   . Heart disease Paternal  Grandfather     Review of Systems  Constitutional: Negative for fever, chills, weight loss and malaise/fatigue.  HENT: Negative for congestion, hearing loss, nosebleeds, sore throat and tinnitus.   Eyes: Negative for blurred vision, double vision, pain and discharge.  Respiratory: Negative for cough, hemoptysis, sputum production, shortness of breath and wheezing.   Cardiovascular: Negative for chest pain, palpitations, claudication, leg swelling and PND.  Gastrointestinal: Negative for heartburn, nausea, vomiting, abdominal pain, diarrhea, constipation, blood in stool and melena.  Genitourinary: Negative for dysuria, urgency, frequency and hematuria.  Musculoskeletal: Negative for myalgias, joint pain and falls.  Skin: Negative for itching and rash.  Neurological: Negative for dizziness, tingling, tremors, sensory change, speech change, focal weakness, seizures, loss of consciousness, weakness and headaches.  Endo/Heme/Allergies: Does not bruise/bleed easily.  Psychiatric/Behavioral: Negative for depression, suicidal ideas, memory loss and substance abuse. The patient is not nervous/anxious and does not have insomnia.     PHYSICAL EXAMINATION  ECOG PERFORMANCE STATUS: 0 - Asymptomatic  There were no vitals filed for this visit.  Physical Exam  Constitutional: She is oriented to person, place, and time and well-developed, well-nourished, and in no distress.  HENT:  Head: Normocephalic and atraumatic.  Nose: Nose normal.  Mouth/Throat: Oropharynx is clear and moist. No oropharyngeal exudate.  Eyes: Conjunctivae and EOM are normal. Pupils are equal, round, and reactive to light. Right eye exhibits no discharge. Left eye exhibits no discharge. No scleral icterus.  Neck: Normal range of motion. Neck supple. No tracheal deviation present. No thyromegaly present.  Cardiovascular: Normal rate, regular rhythm and normal heart sounds.  Exam reveals no gallop and no friction rub.   No murmur  heard. Pulmonary/Chest: Effort normal and breath sounds normal. She has no wheezes. She has no rales.  Abdominal: Soft. Bowel sounds are normal. She exhibits no distension and no mass. There is no tenderness. There is no rebound and no guarding.  Musculoskeletal: Normal range of motion. She exhibits no edema.  Lymphadenopathy:    She has no cervical adenopathy.  Neurological: She is alert and oriented to person, place, and time. She has normal reflexes. No cranial nerve deficit. Gait normal. Coordination normal.  Skin: Skin is warm and dry. No rash noted.  Psychiatric: Mood, memory, affect and judgment normal.  Nursing note and vitals reviewed.  Breast exam: No palpable abnormalities were noted in either breast. No nipple retraction no skin dimpling. Both axilla were clear without palpable adenopathy in the supraclavicular adenopathy was noted.  LABORATORY DATA:  CBC    Component Value Date/Time   WBC 7.4 05/09/2014 1515   RBC 4.17 05/09/2014 1515   HGB 14.0 05/09/2014 1515   HCT 42.6 05/09/2014 1515   PLT  05/09/2014 1515    PLATELET CLUMPS NOTED ON SMEAR, COUNT APPEARS ADEQUATE   MCV 102.2* 05/09/2014 1515   MCH  33.6 05/09/2014 1515   MCHC 32.9 05/09/2014 1515   RDW 12.6 05/09/2014 1515   LYMPHSABS 2.7 05/09/2014 1515   MONOABS 0.6 05/09/2014 1515   EOSABS 0.2 05/09/2014 1515   BASOSABS 0.0 05/09/2014 1515   CMP     Component Value Date/Time   NA 142 05/09/2014 1515   K 4.5 05/09/2014 1515   CL 103 05/09/2014 1515   CO2 27 05/09/2014 1515   GLUCOSE 111* 05/09/2014 1515   BUN 18 05/09/2014 1515   CREATININE 0.64 05/09/2014 1515   CREATININE 0.69 11/20/2013 1004   CALCIUM 10.0 05/09/2014 1515   PROT 7.9 05/09/2014 1515   ALBUMIN 4.3 05/09/2014 1515   AST 28 05/09/2014 1515   ALT 37* 05/09/2014 1515   ALKPHOS 77 05/09/2014 1515   BILITOT 0.5 05/09/2014 1515   GFRNONAA >90 05/09/2014 1515   GFRAA >90 05/09/2014 1515      ASSESSMENT and THERAPY PLAN:   Stage I  ER positive, PR positive, HER-2 negative carcinoma of the left breast.  She will currently continue on Arimidex. She has excellent tolerance. The goal is for 5 years of therapy. We can consider doing a breast cancer index to see if she will benefit from longer duration therapy. I discussed the results of her DEXA with her and expressed my concerns. I have strongly encouraged her to start calcium at least 1200 mg daily, and vitamin D at least 2000 international units daily. We also discussed data in regards to worsening of her bone density potentially on aromatase inhibitors. In addition I discussed my concerns that she already has osteoporosis and have strongly encouraged her to consider therapy with Prolia. She is agreeable to start Prolia therapy. We discussed risks and benefits of treatment. We will see if we can get her approved for another DEXA after 1 year of Prolia in order to reassess her bone density.   I will see her back in 3 months. If she has any additional problems or concerns I have advised her to let us know.   All questions were answered. The patient knows to call the clinic with any problems, questions or concerns. We can certainly see the patient much sooner if necessary. This note was electronically signed. Molli Hazard 09/22/2014

## 2014-10-13 ENCOUNTER — Encounter (HOSPITAL_COMMUNITY): Payer: Self-pay | Admitting: Hematology & Oncology

## 2014-12-07 ENCOUNTER — Encounter: Payer: Self-pay | Admitting: Family Medicine

## 2014-12-07 ENCOUNTER — Ambulatory Visit (INDEPENDENT_AMBULATORY_CARE_PROVIDER_SITE_OTHER): Payer: BLUE CROSS/BLUE SHIELD | Admitting: Family Medicine

## 2014-12-07 VITALS — BP 140/90 | Ht 64.0 in | Wt 179.1 lb

## 2014-12-07 DIAGNOSIS — M810 Age-related osteoporosis without current pathological fracture: Secondary | ICD-10-CM | POA: Diagnosis not present

## 2014-12-07 DIAGNOSIS — I10 Essential (primary) hypertension: Secondary | ICD-10-CM | POA: Diagnosis not present

## 2014-12-07 DIAGNOSIS — E038 Other specified hypothyroidism: Secondary | ICD-10-CM

## 2014-12-07 DIAGNOSIS — E039 Hypothyroidism, unspecified: Secondary | ICD-10-CM | POA: Insufficient documentation

## 2014-12-07 MED ORDER — LISINOPRIL 10 MG PO TABS: 10.0000 mg | ORAL_TABLET | Freq: Every day | ORAL | Status: DC

## 2014-12-07 NOTE — Progress Notes (Signed)
   Subjective:    Patient ID: Beth Hamilton, female    DOB: Dec 08, 1955, 59 y.o.   MRN: [id number]  Hypertension This is a chronic problem. The current episode started more than 1 year ago. The problem has been gradually improving since onset. Pertinent negatives include no chest pain. There are no associated agents to hypertension. There are no known risk factors for coronary artery disease. Treatments tried: lisinopril. The current treatment provides moderate improvement. There are no compliance problems.    Patient states that she has no concerns at this time.  Patient did have bone density test done recently which showed osteoporosis long discussion held with patient about the importance of treating this   Review of Systems  Constitutional: Negative for activity change, appetite change and fatigue.  HENT: Negative for congestion.   Respiratory: Negative for cough.   Cardiovascular: Negative for chest pain.  Gastrointestinal: Negative for abdominal pain.  Endocrine: Negative for polydipsia and polyphagia.  Neurological: Negative for weakness.  Psychiatric/Behavioral: Negative for confusion.       Objective:   Physical Exam  Constitutional: She appears well-nourished. No distress.  Cardiovascular: Normal rate, regular rhythm and normal heart sounds.   No murmur heard. Pulmonary/Chest: Effort normal and breath sounds normal. No respiratory distress.  Musculoskeletal: She exhibits no edema.  Lymphadenopathy:    She has no cervical adenopathy.  Neurological: She is alert. She exhibits normal muscle tone.  Psychiatric: Her behavior is normal.  Vitals reviewed.         Assessment & Plan:  Blood pressure overall doing well continue current measures. Patient does have osteoporosis I recommend that she follow through with specialty center regarding injection for osteoporosis. Patient under the care of hematology oncology Patient has hypothyroidism I do recommend that she follow  through with her specialists. Follow-up here 6 months recheck blood pressure will need lab work in the fall

## 2014-12-07 NOTE — Patient Instructions (Signed)
DASH Eating Plan °DASH stands for "Dietary Approaches to Stop Hypertension." The DASH eating plan is a healthy eating plan that has been shown to reduce high blood pressure (hypertension). Additional health benefits may include reducing the risk of type 2 diabetes mellitus, heart disease, and stroke. The DASH eating plan may also help with weight loss. °WHAT DO I NEED TO KNOW ABOUT THE DASH EATING PLAN? °For the DASH eating plan, you will follow these general guidelines: °· Choose foods with a percent daily value for sodium of less than 5% (as listed on the food label). °· Use salt-free seasonings or herbs instead of table salt or sea salt. °· Check with your health care provider or pharmacist before using salt substitutes. °· Eat lower-sodium products, often labeled as "lower sodium" or "no salt added." °· Eat fresh foods. °· Eat more vegetables, fruits, and low-fat dairy products. °· Choose whole grains. Look for the word "whole" as the first word in the ingredient list. °· Choose fish and skinless chicken or turkey more often than red meat. Limit fish, poultry, and meat to 6 oz (170 g) each day. °· Limit sweets, desserts, sugars, and sugary drinks. °· Choose heart-healthy fats. °· Limit cheese to 1 oz (28 g) per day. °· Eat more home-cooked food and less restaurant, buffet, and fast food. °· Limit fried foods. °· Cook foods using methods other than frying. °· Limit canned vegetables. If you do use them, rinse them well to decrease the sodium. °· When eating at a restaurant, ask that your food be prepared with less salt, or no salt if possible. °WHAT FOODS CAN I EAT? °Seek help from a dietitian for individual calorie needs. °Grains °Whole grain or whole wheat bread. Brown rice. Whole grain or whole wheat pasta. Quinoa, bulgur, and whole grain cereals. Low-sodium cereals. Corn or whole wheat flour tortillas. Whole grain cornbread. Whole grain crackers. Low-sodium crackers. °Vegetables °Fresh or frozen vegetables  (raw, steamed, roasted, or grilled). Low-sodium or reduced-sodium tomato and vegetable juices. Low-sodium or reduced-sodium tomato sauce and paste. Low-sodium or reduced-sodium canned vegetables.  °Fruits °All fresh, canned (in natural juice), or frozen fruits. °Meat and Other Protein Products °Ground beef (85% or leaner), grass-fed beef, or beef trimmed of fat. Skinless chicken or turkey. Ground chicken or turkey. Pork trimmed of fat. All fish and seafood. Eggs. Dried beans, peas, or lentils. Unsalted nuts and seeds. Unsalted canned beans. °Dairy °Low-fat dairy products, such as skim or 1% milk, 2% or reduced-fat cheeses, low-fat ricotta or cottage cheese, or plain low-fat yogurt. Low-sodium or reduced-sodium cheeses. °Fats and Oils °Tub margarines without trans fats. Light or reduced-fat mayonnaise and salad dressings (reduced sodium). Avocado. Safflower, olive, or canola oils. Natural peanut or almond butter. °Other °Unsalted popcorn and pretzels. °The items listed above may not be a complete list of recommended foods or beverages. Contact your dietitian for more options. °WHAT FOODS ARE NOT RECOMMENDED? °Grains °White bread. White pasta. White rice. Refined cornbread. Bagels and croissants. Crackers that contain trans fat. °Vegetables °Creamed or fried vegetables. Vegetables in a cheese sauce. Regular canned vegetables. Regular canned tomato sauce and paste. Regular tomato and vegetable juices. °Fruits °Dried fruits. Canned fruit in light or heavy syrup. Fruit juice. °Meat and Other Protein Products °Fatty cuts of meat. Ribs, chicken wings, bacon, sausage, bologna, salami, chitterlings, fatback, hot dogs, bratwurst, and packaged luncheon meats. Salted nuts and seeds. Canned beans with salt. °Dairy °Whole or 2% milk, cream, half-and-half, and cream cheese. Whole-fat or sweetened yogurt. Full-fat   cheeses or blue cheese. Nondairy creamers and whipped toppings. Processed cheese, cheese spreads, or cheese  curds. °Condiments °Onion and garlic salt, seasoned salt, table salt, and sea salt. Canned and packaged gravies. Worcestershire sauce. Tartar sauce. Barbecue sauce. Teriyaki sauce. Soy sauce, including reduced sodium. Steak sauce. Fish sauce. Oyster sauce. Cocktail sauce. Horseradish. Ketchup and mustard. Meat flavorings and tenderizers. Bouillon cubes. Hot sauce. Tabasco sauce. Marinades. Taco seasonings. Relishes. °Fats and Oils °Butter, stick margarine, lard, shortening, ghee, and bacon fat. Coconut, palm kernel, or palm oils. Regular salad dressings. °Other °Pickles and olives. Salted popcorn and pretzels. °The items listed above may not be a complete list of foods and beverages to avoid. Contact your dietitian for more information. °WHERE CAN I FIND MORE INFORMATION? °National Heart, Lung, and Blood Institute: www.nhlbi.nih.gov/health/health-topics/topics/dash/ °Document Released: 06/27/2011 Document Revised: 11/22/2013 Document Reviewed: 05/12/2013 °ExitCare® Patient Information ©2015 [employer]. This information is not intended to replace advice given to you by your health care provider. Make sure you discuss any questions you have with your health care provider. ° °

## 2014-12-09 ENCOUNTER — Ambulatory Visit (INDEPENDENT_AMBULATORY_CARE_PROVIDER_SITE_OTHER): Payer: BLUE CROSS/BLUE SHIELD | Admitting: Internal Medicine

## 2014-12-09 ENCOUNTER — Encounter: Payer: Self-pay | Admitting: Internal Medicine

## 2014-12-09 VITALS — BP 138/90 | HR 83 | Temp 97.6°F | Ht 64.0 in | Wt 179.4 lb

## 2014-12-09 DIAGNOSIS — K5190 Ulcerative colitis, unspecified, without complications: Secondary | ICD-10-CM

## 2014-12-09 NOTE — Progress Notes (Signed)
Primary Care Physician:  [name], MD Primary Gastroenterologist:  Dr.   Pre-Procedure History & Physical: HPI:  Beth Hamilton is a 59 y.o. female here for followup of a left-sided indeterminate colitis. She has done well Out the period of 2-3 bowel movements daily. No abdominal cramping blood or diarrhea. No upper GI tract symptoms. [age]-year-old to laboratory assays she had a minimally elevated ALT. No recent liver imaging. She is to see oncology next month (stage I breast cancer) will be having lab work done at that point in time. Her serum creatinine has remained normal.   Weight down 3 pounds since her last visit.  Past Medical History  Diagnosis Date  . IBD (inflammatory bowel disease)   . Hemorrhoids   . Psoriasis   . Thyroid disease   . Ulcerative colitis 5 1 2007    Dr Gala Romney  . Complication of anesthesia     not sure what med was given in 96 with D&C, was very sore all over. Dr Patsey Berthold did anesthesia. Dr Heide Spark did surgery  . Hypertension   . Hypothyroidism   . Breast cancer 04/20/14    left breast    Past Surgical History  Procedure Laterality Date  . Wisdom tooth extraction    . Colonoscopy  10/09/2001    RMR: Internal hemorrhoids and anal papilla; otherwise normal rectum  . Colonoscopy  12/10/2005    incomplete tcs-  colitis  . Flexible sigmoidoscopy  02/26/2006    endoscopically normal-appearing rectum, colitis in sigmoid mucosa  . Tubal ligation    . Dilation and curettage of uterus  1996    Dr Heide Spark  . Colonoscopy N/A 04/18/2014    Dr.Jalea Bronaugh- abnormal rectum & L colon into the mid descending segment. mucosa was erythematious as well as pale w/ some loss of normal vascular apttern. no erosions or ulcers. pt had some smooth peduculated 3-4cm polyps in the sigmoid segment most c/w pseudopolyps. the rest of the colonic mucosa appeared normal. bx= inflammatory polyps  . Partial mastectomy with needle localization and axillary sentinel lymph node bx  Left 04/20/2014    Procedure: PARTIAL MASTECTOMY AFTER NEEDLE LOCALIZATION AND AXILLARY SENTINEL LYMPH NODE BX;  Surgeon: Jamesetta So, MD;  Location: [facility];  Service: General;  Laterality: Left;    Prior to Admission medications   Medication Sig Start Date End Date Taking? Authorizing Provider  anastrozole (ARIMIDEX) 1 MG tablet Take 1 tablet (1 mg total) by mouth daily. 05/24/14  Yes Farrel Gobble, MD  ARMOUR THYROID 30 MG tablet Take 30 mg by mouth daily.  07/11/12  Yes Historical Provider, MD  lisinopril (PRINIVIL,ZESTRIL) 10 MG tablet Take 1 tablet (10 mg total) by mouth daily. 12/07/14  Yes [name], MD  mesalamine (LIALDA) 1.2 G EC tablet TAKE 4 TABLETS BY MOUTH DAILY. 05/27/14  Yes Mahala Menghini, PA-C    Allergies as of 12/09/2014 - Review Complete 12/09/2014  Allergen Reaction Noted  . Flagyl [metronidazole]  10/29/2013  . Other Other (See Comments) 03/22/2014  . Levaquin [levofloxacin in d5w]  07/23/2013    Family History  Problem Relation Age of Onset  . Colon cancer Neg Hx   . Stroke Maternal Grandfather   . Hypertension Mother   . Diabetes Paternal Grandfather   . Heart disease Paternal Grandfather     History   Social History  . Marital Status: Married    Spouse Name: N/A  . Number of Children: N/A  . Years of Education:  N/A   Occupational History  . Not on file.   Social History Main Topics  . Smoking status: Former Smoker -- 0.50 packs/day for 15 years    Types: Cigarettes    Quit date: 07/26/2005  . Smokeless tobacco: Never Used  . Alcohol Use: Yes     Comment: wine every day  . Drug Use: No  . Sexual Activity: Yes    Birth Control/ Protection: Post-menopausal   Other Topics Concern  . Not on file   Social History Narrative    Review of Systems: See HPI, otherwise negative ROS  Physical Exam: BP 138/90 mmHg  Pulse 83  Temp(Src) 97.6 F (36.4 C) (Oral)  Ht 5' 4"  (1.626 m)  Wt 179 lb 6.4 oz (81.375 kg)  BMI 30.78  kg/m2 General:   Alert,  Well-developed, well-nourished, pleasant and cooperative in NAD Skin:  Intact without significant lesions or rashes. Eyes:  Sclera clear, no icterus.   Conjunctiva pink. Ears:  Normal auditory acuity. Nose:  No deformity, discharge,  or lesions. Mouth:  No deformity or lesions. Neck:  Supple; no masses or thyromegaly. No significant cervical adenopathy. Lungs:  Clear throughout to auscultation.   No wheezes, crackles, or rhonchi. No acute distress. Heart:  Regular rate and rhythm; no murmurs, clicks, rubs,  or gallops. Abdomen: Non-distended, normal bowel sounds.  Soft and nontender without appreciable mass or hepatosplenomegaly.  Pulses:  Normal pulses noted. Extremities:  Without clubbing or edema.  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 Stage I breast cancer currently doing very well there as well. She'll be due for a high-risk screening colonoscopy in 4 years. Minimally elevated ALT of uncertain significance last year. Those labs need to be repeated.  Recommendations:Continue Lialda  I'll review labs from Dr. Whitney Muse when they become available  Office visit in 1 year       Notice: This dictation was prepared with Dragon dictation along with smaller phrase technology. Any transcriptional errors that result from this process are unintentional and may not be corrected upon review.

## 2014-12-09 NOTE — Patient Instructions (Signed)
Continue Lialda  I'll review labs from Dr. Whitney Muse when they become available  Office visit in 1 year

## 2014-12-23 ENCOUNTER — Ambulatory Visit (HOSPITAL_COMMUNITY): Payer: Self-pay

## 2014-12-23 ENCOUNTER — Other Ambulatory Visit (HOSPITAL_COMMUNITY): Payer: Self-pay

## 2014-12-23 ENCOUNTER — Ambulatory Visit (HOSPITAL_COMMUNITY): Payer: Self-pay | Admitting: Hematology & Oncology

## 2014-12-30 ENCOUNTER — Encounter (HOSPITAL_COMMUNITY): Payer: BLUE CROSS/BLUE SHIELD | Attending: Hematology & Oncology

## 2014-12-30 ENCOUNTER — Encounter (HOSPITAL_COMMUNITY): Payer: Self-pay | Admitting: Hematology & Oncology

## 2014-12-30 ENCOUNTER — Encounter (HOSPITAL_COMMUNITY): Payer: Self-pay | Admitting: Oncology

## 2014-12-30 ENCOUNTER — Encounter (HOSPITAL_BASED_OUTPATIENT_CLINIC_OR_DEPARTMENT_OTHER): Payer: BLUE CROSS/BLUE SHIELD | Admitting: Hematology & Oncology

## 2014-12-30 ENCOUNTER — Encounter (HOSPITAL_COMMUNITY): Payer: BLUE CROSS/BLUE SHIELD

## 2014-12-30 ENCOUNTER — Ambulatory Visit (HOSPITAL_COMMUNITY): Payer: Self-pay | Admitting: Hematology & Oncology

## 2014-12-30 VITALS — BP 157/98 | HR 95 | Temp 99.4°F | Resp 18 | Wt 179.0 lb

## 2014-12-30 DIAGNOSIS — D7589 Other specified diseases of blood and blood-forming organs: Secondary | ICD-10-CM | POA: Insufficient documentation

## 2014-12-30 DIAGNOSIS — C50512 Malignant neoplasm of lower-outer quadrant of left female breast: Secondary | ICD-10-CM | POA: Diagnosis not present

## 2014-12-30 DIAGNOSIS — M810 Age-related osteoporosis without current pathological fracture: Secondary | ICD-10-CM | POA: Diagnosis present

## 2014-12-30 DIAGNOSIS — Z853 Personal history of malignant neoplasm of breast: Secondary | ICD-10-CM

## 2014-12-30 LAB — COMPREHENSIVE METABOLIC PANEL
ALBUMIN: 4 g/dL (ref 3.5–5.0)
ALK PHOS: 63 U/L (ref 38–126)
ALT: 46 U/L (ref 14–54)
AST: 27 U/L (ref 15–41)
Anion gap: 10 (ref 5–15)
BILIRUBIN TOTAL: 0.8 mg/dL (ref 0.3–1.2)
BUN: 16 mg/dL (ref 6–20)
CALCIUM: 9.6 mg/dL (ref 8.9–10.3)
CO2: 29 mmol/L (ref 22–32)
CREATININE: 0.72 mg/dL (ref 0.44–1.00)
Chloride: 100 mmol/L — ABNORMAL LOW (ref 101–111)
GFR calc Af Amer: >60 mL/min (ref >60)
GFR calc non Af Amer: >60 mL/min (ref >60)
GLUCOSE: 103 mg/dL — AB (ref 65–99)
Potassium: 4.4 mmol/L (ref 3.5–5.1)
SODIUM: 139 mmol/L (ref 135–145)
Total Protein: 7.2 g/dL (ref 6.5–8.1)

## 2014-12-30 LAB — CBC WITH DIFFERENTIAL/PLATELET
BASOS PCT: 0 % (ref 0–1)
Basophils Absolute: 0 10*3/uL (ref 0.0–0.1)
EOS PCT: 2 % (ref 0–5)
Eosinophils Absolute: 0.2 10*3/uL (ref 0.0–0.7)
HEMATOCRIT: 40.9 % (ref 36.0–46.0)
Hemoglobin: 12.8 g/dL (ref 12.0–15.0)
LYMPHS PCT: 21 % (ref 12–46)
Lymphs Abs: 1.6 10*3/uL (ref 0.7–4.0)
MCH: 32.7 pg (ref 26.0–34.0)
MCHC: 31.3 g/dL (ref 30.0–36.0)
MCV: 104.6 fL — ABNORMAL HIGH (ref 78.0–100.0)
Monocytes Absolute: 0.5 10*3/uL (ref 0.1–1.0)
Monocytes Relative: 7 % (ref 3–12)
Neutro Abs: 5.4 10*3/uL (ref 1.7–7.7)
Neutrophils Relative %: 70 % (ref 43–77)
Platelets: 258 10*3/uL (ref 150–400)
RBC: 3.91 MIL/uL (ref 3.87–5.11)
RDW: 13.1 % (ref 11.5–15.5)
WBC: 7.7 10*3/uL (ref 4.0–10.5)

## 2014-12-30 LAB — VITAMIN B12: VITAMIN B 12: 219 pg/mL (ref 180–914)

## 2014-12-30 MED ORDER — DENOSUMAB 60 MG/ML ~~LOC~~ SOLN
60.0000 mg | Freq: Once | SUBCUTANEOUS | Status: AC
  Administered 2014-12-30: 60 mg via SUBCUTANEOUS
  Filled 2014-12-30: qty 1

## 2014-12-30 NOTE — Patient Instructions (Signed)
[place] at [facility] Discharge Instructions  RECOMMENDATIONS MADE BY THE CONSULTANT AND ANY TEST RESULTS WILL BE SENT TO YOUR REFERRING PHYSICIAN.  Office visit today with Dr. Whitney Muse. Prolia injection today. Return in 3 months for office visit. Prolia injection in 6 months.  Thank you for choosing [place] at [facility] to provide your oncology and hematology care.  To afford each patient quality time with our provider, please arrive at least 15 minutes before your scheduled appointment time.    You need to re-schedule your appointment should you arrive 10 or more minutes late.  We strive to give you quality time with our providers, and arriving late affects you and other patients whose appointments are after yours.  Also, if you no show three or more times for appointments you may be dismissed from the clinic at the providers discretion.     Again, thank you for choosing [facility].  Our hope is that these requests will decrease the amount of time that you wait before being seen by our physicians.       _____________________________________________________________  Should you have questions after your visit to [facility], please contact our office at [phone] between the hours of 8:30 a.m. and 4:30 p.m.  Voicemails left after 4:30 p.m. will not be returned until the following business day.  For prescription refill requests, have your pharmacy contact our office.

## 2014-12-30 NOTE — Progress Notes (Signed)
Beth Hamilton presents today for injection per the provider's orders.  Prolia administration without incident; see MAR for injection details.  Patient tolerated procedure well and without incident.  No questions or complaints noted at this time.

## 2014-12-30 NOTE — Progress Notes (Signed)
See MD encounter

## 2014-12-30 NOTE — Progress Notes (Signed)
LABS DRAWN

## 2014-12-30 NOTE — Progress Notes (Signed)
Beth Lange, MD [place] [id number]    DIAGNOSIS: Breast cancer of lower-outer quadrant of left female breast   Staging form: Breast, AJCC 7th Edition     Clinical: No stage assigned - Unsigned  Stage I (pT1b pNOMx) infiltrating ductal carcinoma of the left breast, status post lumpectomy and axillary sentinel node sampling, ER/PR positive, HER-2/neu not overexpressed, Oncotype DX score of 7, adjuvant XRT. On Arimidex.  Biopsy was performed on 04/05/2014 at which time a combination of infiltrating ductal carcinoma and noninvasive ductal neoplasia were found, ER 100%, PR 100%  Ulcerative colitis, controlled.  DEXA 05/2014 with osteoporosis COLONOSCOPY 04/18/2014  SUMMARY OF ONCOLOGIC HISTORY:   Breast cancer of lower-outer quadrant of left female breast   05/24/2014 Initial Diagnosis Breast cancer of lower-outer quadrant of left female breast   06/22/2014 - 08/05/2014 Radiation Therapy Left breast 50 Gy at 2 Gy per fraction x 25 fractions with left breast boost 10 Gy at 2 Gy per fraction x 5 fractions by Dr. Pablo Ledger.    CURRENT THERAPY: Arimidex  INTERVAL HISTORY: Beth Hamilton 59 y.o. female returns for follow-up of a stage I Left breast cancer. She is currently on Arimidex. DEXA scan performed in November 2015 showed osteoporosis.   She is here alone today and says she is doing fine.  She is excited about her son's high school graduation and college arrangements.   She is regularly taking calcium and vitamin D. She is here today for Prolia. She had questions regarding her bone density testing. She has no other major complaints today she is eating and sleeping well. She will be due for mammography again in September.   MEDICAL HISTORY: Past Medical History  Diagnosis Date  . IBD (inflammatory bowel disease)   . Hemorrhoids   . Psoriasis   . Thyroid disease   . Ulcerative colitis 5 1 2007    Dr Gala Romney  . Complication of anesthesia     not  sure what med was given in 96 with D&C, was very sore all over. Dr Patsey Berthold did anesthesia. Dr Heide Spark did surgery  . Hypertension   . Hypothyroidism   . Breast cancer 04/20/14    left breast    has HEMORRHOIDS; INFLAMMATORY BOWEL DISEASE; DIARRHEA; ABDOMINAL CRAMPS; HTN (hypertension), benign; Breast cancer of lower-outer quadrant of left female breast; Hypothyroidism; and Osteoporosis on her problem list.     is allergic to flagyl; other; and levaquin.  We administered denosumab.  SURGICAL HISTORY: Past Surgical History  Procedure Laterality Date  . Wisdom tooth extraction    . Colonoscopy  10/09/2001    RMR: Internal hemorrhoids and anal papilla; otherwise normal rectum  . Colonoscopy  12/10/2005    incomplete tcs-  colitis  . Flexible sigmoidoscopy  02/26/2006    endoscopically normal-appearing rectum, colitis in sigmoid mucosa  . Tubal ligation    . Dilation and curettage of uterus  1996    Dr Heide Spark  . Colonoscopy N/A 04/18/2014    Dr.Rourk- abnormal rectum & L colon into the mid descending segment. mucosa was erythematious as well as pale w/ some loss of normal vascular apttern. no erosions or ulcers. pt had some smooth peduculated 3-4cm polyps in the sigmoid segment most c/w pseudopolyps. the rest of the colonic mucosa appeared normal. bx= inflammatory polyps  . Partial mastectomy with needle localization and axillary sentinel lymph node bx Left 04/20/2014    Procedure: PARTIAL MASTECTOMY AFTER NEEDLE LOCALIZATION AND AXILLARY SENTINEL  LYMPH NODE BX;  Surgeon: Jamesetta So, MD;  Location: [facility];  Service: General;  Laterality: Left;    SOCIAL HISTORY: History   Social History  . Marital Status: Married    Spouse Name: N/A  . Number of Children: N/A  . Years of Education: N/A   Occupational History  . Not on file.   Social History Main Topics  . Smoking status: Former Smoker -- 0.50 packs/day for 15 years    Types: Cigarettes    Quit date: 07/26/2005  . Smokeless  tobacco: Never Used  . Alcohol Use: Yes     Comment: wine every day  . Drug Use: No  . Sexual Activity: Yes    Birth Control/ Protection: Post-menopausal   Other Topics Concern  . Not on file   Social History Narrative    FAMILY HISTORY: Family History  Problem Relation Age of Onset  . Colon cancer Neg Hx   . Stroke Maternal Grandfather   . Hypertension Mother   . Diabetes Paternal Grandfather   . Heart disease Paternal Grandfather     Review of Systems  Constitutional: Negative for fever, chills, weight loss and malaise/fatigue.  HENT: Negative for congestion, hearing loss, nosebleeds, sore throat and tinnitus.   Eyes: Negative for blurred vision, double vision, pain and discharge.  Respiratory: Negative for cough, hemoptysis, sputum production, shortness of breath and wheezing.   Cardiovascular: Negative for chest pain, palpitations, claudication, leg swelling and PND.  Gastrointestinal: Negative for heartburn, nausea, vomiting, abdominal pain, diarrhea, constipation, blood in stool and melena.  Genitourinary: Negative for dysuria, urgency, frequency and hematuria.  Musculoskeletal: Negative for myalgias, joint pain and falls.  Skin: Negative for itching and rash.  Neurological: Negative for dizziness, tingling, tremors, sensory change, speech change, focal weakness, seizures, loss of consciousness, weakness and headaches.  Endo/Heme/Allergies: Does not bruise/bleed easily.  Psychiatric/Behavioral: Negative for depression, suicidal ideas, memory loss and substance abuse. The patient is not nervous/anxious and does not have insomnia.    14 point review of systems was performed and is negative except as detailed under history of present illness and above   PHYSICAL EXAMINATION  ECOG PERFORMANCE STATUS: 0 - Asymptomatic  Filed Vitals:   12/30/14 1034  BP: 157/98  Pulse: 95  Temp: 99.4 F (37.4 C)  Resp: 18    Physical Exam  Constitutional: She is oriented to  person, place, and time and well-developed, well-nourished, and in no distress.  HENT:  Head: Normocephalic and atraumatic.  Nose: Nose normal.  Mouth/Throat: Oropharynx is clear and moist. No oropharyngeal exudate.  Eyes: Conjunctivae and EOM are normal. Pupils are equal, round, and reactive to light. Right eye exhibits no discharge. Left eye exhibits no discharge. No scleral icterus.  Neck: Normal range of motion. Neck supple. No tracheal deviation present. No thyromegaly present.  Cardiovascular: Normal rate, regular rhythm and normal heart sounds.  Exam reveals no gallop and no friction rub.   No murmur heard. Pulmonary/Chest: Effort normal and breath sounds normal. She has no wheezes. She has no rales.  Abdominal: Soft. Bowel sounds are normal. She exhibits no distension and no mass. There is no tenderness. There is no rebound and no guarding.  Musculoskeletal: Normal range of motion. She exhibits no edema.  Lymphadenopathy:    She has no cervical adenopathy.  Neurological: She is alert and oriented to person, place, and time. She has normal reflexes. No cranial nerve deficit. Gait normal. Coordination normal.  Skin: Skin is warm and dry. No  rash noted.  Psychiatric: Mood, memory, affect and judgment normal.  Nursing note and vitals reviewed.  Breast exam: No palpable abnormalities were noted in either breast. No nipple retraction no skin dimpling. Both axilla were clear without palpable adenopathy in the supraclavicular adenopathy was noted. L breast incision site, 9:00 position, minor scar tissue. Yeast underneath both breasts in the inframammary folds  LABORATORY DATA:  CBC    Component Value Date/Time   WBC 7.7 12/30/2014 1015   RBC 3.91 12/30/2014 1015   HGB 12.8 12/30/2014 1015   HCT 40.9 12/30/2014 1015   PLT 258 12/30/2014 1015   MCV 104.6* 12/30/2014 1015   MCH 32.7 12/30/2014 1015   MCHC 31.3 12/30/2014 1015   RDW 13.1 12/30/2014 1015   LYMPHSABS 1.6 12/30/2014 1015    MONOABS 0.5 12/30/2014 1015   EOSABS 0.2 12/30/2014 1015   BASOSABS 0.0 12/30/2014 1015   CMP     Component Value Date/Time   NA 139 12/30/2014 1015   K 4.4 12/30/2014 1015   CL 100* 12/30/2014 1015   CO2 29 12/30/2014 1015   GLUCOSE 103* 12/30/2014 1015   BUN 16 12/30/2014 1015   CREATININE 0.72 12/30/2014 1015   CREATININE 0.69 11/20/2013 1004   CALCIUM 9.6 12/30/2014 1015   PROT 7.2 12/30/2014 1015   ALBUMIN 4.0 12/30/2014 1015   AST 27 12/30/2014 1015   ALT 46 12/30/2014 1015   ALKPHOS 63 12/30/2014 1015   BILITOT 0.8 12/30/2014 1015   GFRNONAA >60 12/30/2014 1015   GFRAA >60 12/30/2014 1015      ASSESSMENT and THERAPY PLAN:   Stage I ER positive, PR positive, HER-2 negative carcinoma of the left breast. Osteoporosis Macrocytosis  She will currently continue on Arimidex. She has excellent tolerance. The goal is for 5 years of therapy. We can consider doing a breast cancer index to see if she will benefit from longer duration therapy.  She has been taking calcium and vitamin D. She will start Prolia today. We may need to consider monitoring a urine NTX. I have addressed this with her.  I am adding a G38 and folic acid to her labs today because of macrocytosis. She does not want to have to take any more "pills." I advised her I will keep her apprised results when available.  In regards to her yeast under her breasts she would like to try something OTC first.   All questions were answered. The patient knows to call the clinic with any problems, questions or concerns. We can certainly see the patient much sooner if necessary.   We will see her back in 3 months.  This document serves as a record of services personally performed by Ancil Linsey, MD. It was created on her behalf by Janace Hoard, a trained medical scribe. The creation of this record is based on the scribe's personal observations and the provider's statements to them. This document has been checked  and approved by the attending provider.  I have reviewed the above documentation for accuracy and completeness, and I agree with the above.  This note was electronically signed.  Kelby Fam. Penland, MD 01/08/2015

## 2014-12-31 LAB — FOLATE: Folate: 12.2 ng/mL (ref >5.9)

## 2014-12-31 LAB — VITAMIN D 25 HYDROXY (VIT D DEFICIENCY, FRACTURES): Vit D, 25-Hydroxy: 29.2 ng/mL — ABNORMAL LOW (ref 30.0–100.0)

## 2015-01-06 ENCOUNTER — Telehealth (HOSPITAL_COMMUNITY): Payer: Self-pay | Admitting: Hematology & Oncology

## 2015-01-06 NOTE — Telephone Encounter (Signed)
FAXED [place] [phone]

## 2015-01-10 ENCOUNTER — Other Ambulatory Visit (HOSPITAL_COMMUNITY): Payer: Self-pay | Admitting: Oncology

## 2015-01-10 DIAGNOSIS — E559 Vitamin D deficiency, unspecified: Secondary | ICD-10-CM

## 2015-01-10 MED ORDER — ERGOCALCIFEROL 1.25 MG (50000 UT) PO CAPS: 50000.0000 [IU] | ORAL_CAPSULE | ORAL | Status: DC

## 2015-01-27 ENCOUNTER — Other Ambulatory Visit: Payer: Self-pay | Admitting: Obstetrics & Gynecology

## 2015-01-27 ENCOUNTER — Other Ambulatory Visit: Payer: Self-pay

## 2015-01-27 DIAGNOSIS — C50912 Malignant neoplasm of unspecified site of left female breast: Secondary | ICD-10-CM

## 2015-01-28 ENCOUNTER — Other Ambulatory Visit: Payer: Self-pay | Admitting: Obstetrics and Gynecology

## 2015-03-31 ENCOUNTER — Encounter (HOSPITAL_BASED_OUTPATIENT_CLINIC_OR_DEPARTMENT_OTHER): Payer: BLUE CROSS/BLUE SHIELD

## 2015-03-31 ENCOUNTER — Ambulatory Visit (HOSPITAL_COMMUNITY): Payer: BLUE CROSS/BLUE SHIELD | Admitting: Hematology & Oncology

## 2015-03-31 ENCOUNTER — Other Ambulatory Visit (HOSPITAL_COMMUNITY): Payer: BLUE CROSS/BLUE SHIELD

## 2015-03-31 ENCOUNTER — Encounter (HOSPITAL_COMMUNITY): Payer: BLUE CROSS/BLUE SHIELD | Attending: Hematology & Oncology | Admitting: Hematology & Oncology

## 2015-03-31 ENCOUNTER — Encounter (HOSPITAL_COMMUNITY): Payer: Self-pay | Admitting: Hematology & Oncology

## 2015-03-31 VITALS — BP 137/86 | HR 96 | Temp 99.1°F | Resp 16 | Wt 181.9 lb

## 2015-03-31 DIAGNOSIS — M810 Age-related osteoporosis without current pathological fracture: Secondary | ICD-10-CM | POA: Insufficient documentation

## 2015-03-31 DIAGNOSIS — D7589 Other specified diseases of blood and blood-forming organs: Secondary | ICD-10-CM

## 2015-03-31 DIAGNOSIS — E559 Vitamin D deficiency, unspecified: Secondary | ICD-10-CM | POA: Diagnosis not present

## 2015-03-31 DIAGNOSIS — C50919 Malignant neoplasm of unspecified site of unspecified female breast: Secondary | ICD-10-CM | POA: Diagnosis not present

## 2015-03-31 DIAGNOSIS — C50512 Malignant neoplasm of lower-outer quadrant of left female breast: Secondary | ICD-10-CM | POA: Insufficient documentation

## 2015-03-31 LAB — COMPREHENSIVE METABOLIC PANEL
ALT: 39 U/L (ref 14–54)
AST: 24 U/L (ref 15–41)
Albumin: 4 g/dL (ref 3.5–5.0)
Alkaline Phosphatase: 66 U/L (ref 38–126)
Anion gap: 7 (ref 5–15)
BUN: 17 mg/dL (ref 6–20)
CHLORIDE: 101 mmol/L (ref 101–111)
CO2: 29 mmol/L (ref 22–32)
CREATININE: 0.64 mg/dL (ref 0.44–1.00)
Calcium: 9.5 mg/dL (ref 8.9–10.3)
Glucose, Bld: 107 mg/dL — ABNORMAL HIGH (ref 65–99)
POTASSIUM: 4.6 mmol/L (ref 3.5–5.1)
Sodium: 137 mmol/L (ref 135–145)
Total Bilirubin: 0.7 mg/dL (ref 0.3–1.2)
Total Protein: 7.9 g/dL (ref 6.5–8.1)

## 2015-03-31 LAB — FOLATE: Folate: 40 ng/mL (ref >5.9)

## 2015-03-31 LAB — CBC WITH DIFFERENTIAL/PLATELET
Basophils Absolute: 0 10*3/uL (ref 0.0–0.1)
Basophils Relative: 0 % (ref 0–1)
EOS PCT: 1 % (ref 0–5)
Eosinophils Absolute: 0.1 10*3/uL (ref 0.0–0.7)
HCT: 38.5 % (ref 36.0–46.0)
Hemoglobin: 12.1 g/dL (ref 12.0–15.0)
LYMPHS ABS: 1.1 10*3/uL (ref 0.7–4.0)
Lymphocytes Relative: 12 % (ref 12–46)
MCH: 32.7 pg (ref 26.0–34.0)
MCHC: 31.4 g/dL (ref 30.0–36.0)
MCV: 104.1 fL — AB (ref 78.0–100.0)
MONO ABS: 0.7 10*3/uL (ref 0.1–1.0)
MONOS PCT: 8 % (ref 3–12)
Neutro Abs: 7.1 10*3/uL (ref 1.7–7.7)
Neutrophils Relative %: 79 % — ABNORMAL HIGH (ref 43–77)
PLATELETS: 301 10*3/uL (ref 150–400)
RBC: 3.7 MIL/uL — ABNORMAL LOW (ref 3.87–5.11)
RDW: 12.5 % (ref 11.5–15.5)
WBC: 9.1 10*3/uL (ref 4.0–10.5)

## 2015-03-31 LAB — VITAMIN B12: Vitamin B-12: 814 pg/mL (ref 180–914)

## 2015-03-31 MED ORDER — ANASTROZOLE 1 MG PO TABS: 1.0000 mg | ORAL_TABLET | Freq: Every day | ORAL | Status: DC

## 2015-03-31 NOTE — Progress Notes (Signed)
LABS DRAWN

## 2015-03-31 NOTE — Progress Notes (Signed)
Beth Lange, MD [place] [address]    DIAGNOSIS: Breast cancer of lower-outer quadrant of left female breast   Staging form: Breast, AJCC 7th Edition     Clinical: No stage assigned - Unsigned  Stage I (pT1b pNOMx) infiltrating ductal carcinoma of the left breast, status post lumpectomy and axillary sentinel node sampling, ER/PR positive, HER-2/neu not overexpressed, Oncotype DX score of 7, adjuvant XRT. On Arimidex.  Biopsy was performed on 04/05/2014 at which time a combination of infiltrating ductal carcinoma and noninvasive ductal neoplasia were found, ER 100%, PR 100%  Ulcerative colitis, controlled.  DEXA 05/2014 with osteoporosis COLONOSCOPY 04/18/2014  SUMMARY OF ONCOLOGIC HISTORY:   Breast cancer of lower-outer quadrant of left female breast   05/24/2014 Initial Diagnosis Breast cancer of lower-outer quadrant of left female breast   06/22/2014 - 08/05/2014 Radiation Therapy Left breast 50 Gy at 2 Gy per fraction x 25 fractions with left breast boost 10 Gy at 2 Gy per fraction x 5 fractions by Dr. Pablo Ledger.    CURRENT THERAPY: Arimidex  INTERVAL HISTORY: Beth Hamilton 59 y.o. female returns for follow-up of a stage I Left breast cancer. She is currently on Arimidex. DEXA scan performed in November 2015 showed osteoporosis.   Beth Hamilton is here alone today, in a pleasant and humorous mood. She states that she is "doing good," and not getting into trouble because there's "no trouble to get in to." She remarks that she is feeling very well, with the exception of her anxiety about her mammogram in [place] next Friday. She indicates that once she gets that done, she'll feel relieved.   She confirms with some amusement that she is eating well, and laughs, indicating that she is eating VERY well.  Beth Hamilton confirms that she has been taking her medications as prescribed. She is taking F54 and folic acid every morning.   She indicates that her  left breast is still feeling a little sore and tender, with the pain located medially on her breast, close to her midline.  She denies any belly pain.  She is concerned about the cost of the Prolia shot, as she had to pay "a significant copay" of pocket for it. She felt it was very expensive.   MEDICAL HISTORY: Past Medical History  Diagnosis Date  . IBD (inflammatory bowel disease)   . Hemorrhoids   . Psoriasis   . Thyroid disease   . Ulcerative colitis 5 1 2007    Dr Gala Romney  . Complication of anesthesia     not sure what med was given in 96 with D&C, was very sore all over. Dr Patsey Berthold did anesthesia. Dr Heide Spark did surgery  . Hypertension   . Hypothyroidism   . Breast cancer 04/20/14    left breast    has HEMORRHOIDS; INFLAMMATORY BOWEL DISEASE; DIARRHEA; ABDOMINAL CRAMPS; HTN (hypertension), benign; Breast cancer of lower-outer quadrant of left female breast; Hypothyroidism; and Osteoporosis on her problem list.     is allergic to flagyl; other; and levaquin.  Beth Hamilton had no medications administered during this visit.  SURGICAL HISTORY: Past Surgical History  Procedure Laterality Date  . Wisdom tooth extraction    . Colonoscopy  10/09/2001    RMR: Internal hemorrhoids and anal papilla; otherwise normal rectum  . Colonoscopy  12/10/2005    incomplete tcs-  colitis  . Flexible sigmoidoscopy  02/26/2006    endoscopically normal-appearing rectum, colitis in sigmoid mucosa  . Tubal  ligation    . Dilation and curettage of uterus  1996    Dr Heide Spark  . Colonoscopy N/A 04/18/2014    Dr.Rourk- abnormal rectum & L colon into the mid descending segment. mucosa was erythematious as well as pale w/ some loss of normal vascular apttern. no erosions or ulcers. pt had some smooth peduculated 3-4cm polyps in the sigmoid segment most c/w pseudopolyps. the rest of the colonic mucosa appeared normal. bx= inflammatory polyps  . Partial mastectomy with needle localization and axillary  sentinel lymph node bx Left 04/20/2014    Procedure: PARTIAL MASTECTOMY AFTER NEEDLE LOCALIZATION AND AXILLARY SENTINEL LYMPH NODE BX;  Surgeon: Jamesetta So, MD;  Location: [facility];  Service: General;  Laterality: Left;    SOCIAL HISTORY: Social History   Social History  . Marital Status: Married    Spouse Name: N/A  . Number of Children: N/A  . Years of Education: N/A   Occupational History  . Not on file.   Social History Main Topics  . Smoking status: Former Smoker -- 0.50 packs/day for 15 years    Types: Cigarettes    Quit date: 07/26/2005  . Smokeless tobacco: Never Used  . Alcohol Use: Yes     Comment: wine every day  . Drug Use: No  . Sexual Activity: Yes    Birth Control/ Protection: Post-menopausal   Other Topics Concern  . Not on file   Social History Narrative    FAMILY HISTORY: Family History  Problem Relation Age of Onset  . Colon cancer Neg Hx   . Stroke Maternal Grandfather   . Hypertension Mother   . Diabetes Paternal Grandfather   . Heart disease Paternal Grandfather     Review of Systems  Constitutional: Negative for fever, chills, weight loss and malaise/fatigue.  HENT: Negative for congestion, hearing loss, nosebleeds, sore throat and tinnitus.   Eyes: Negative for blurred vision, double vision, pain and discharge.  Respiratory: Negative for cough, hemoptysis, sputum production, shortness of breath and wheezing.   Cardiovascular: Negative for chest pain, palpitations, claudication, leg swelling and PND.  Gastrointestinal: Negative for heartburn, nausea, vomiting, abdominal pain, diarrhea, constipation, blood in stool and melena.  Genitourinary: Negative for dysuria, urgency, frequency and hematuria.  Musculoskeletal: Negative for myalgias, joint pain and falls.  Skin: Negative for itching and rash.  Neurological: Negative for dizziness, tingling, tremors, sensory change, speech change, focal weakness, seizures, loss of consciousness, weakness  and headaches.  Endo/Heme/Allergies: Does not bruise/bleed easily.  Psychiatric/Behavioral: Negative for depression, suicidal ideas, memory loss and substance abuse. The patient is not nervous/anxious and does not have insomnia.    14 point review of systems was performed and is negative except as detailed under history of present illness and above   PHYSICAL EXAMINATION  ECOG PERFORMANCE STATUS: 0 - Asymptomatic  Filed Vitals:   03/31/15 0839  BP: 137/86  Pulse: 96  Temp: 99.1 F (37.3 C)  Resp: 16    Physical Exam  Constitutional: She is oriented to person, place, and time and well-developed, well-nourished, and in no distress.  HENT:  Head: Normocephalic and atraumatic.  Nose: Nose normal.  Mouth/Throat: Oropharynx is clear and moist. No oropharyngeal exudate.  Eyes: Conjunctivae and EOM are normal. Pupils are equal, round, and reactive to light. Right eye exhibits no discharge. Left eye exhibits no discharge. No scleral icterus.  Neck: Normal range of motion. Neck supple. No tracheal deviation present. No thyromegaly present.  Cardiovascular: Normal rate, regular rhythm and normal heart  sounds.  Exam reveals no gallop and no friction rub.   No murmur heard. Pulmonary/Chest: Effort normal and breath sounds normal. She has no wheezes. She has no rales.  Abdominal: Soft. Bowel sounds are normal. She exhibits no distension and no mass. There is no tenderness. There is no rebound and no guarding.  Musculoskeletal: Normal range of motion. She exhibits no edema.  Lymphadenopathy:    She has no cervical adenopathy. No palpable axillary adenopathy or supraclavicular adenopathy Neurological: She is alert and oriented to person, place, and time. She has normal reflexes. No cranial nerve deficit. Gait normal. Coordination normal.  Skin: Skin is warm and dry. No rash noted.  Psychiatric: Mood, memory, affect and judgment normal.  Nursing note and vitals reviewed.   LABORATORY  DATA:  CBC    Component Value Date/Time   WBC 9.1 03/31/2015 0946   RBC 3.70* 03/31/2015 0946   HGB 12.1 03/31/2015 0946   HCT 38.5 03/31/2015 0946   PLT 301 03/31/2015 0946   MCV 104.1* 03/31/2015 0946   MCH 32.7 03/31/2015 0946   MCHC 31.4 03/31/2015 0946   RDW 12.5 03/31/2015 0946   LYMPHSABS 1.1 03/31/2015 0946   MONOABS 0.7 03/31/2015 0946   EOSABS 0.1 03/31/2015 0946   BASOSABS 0.0 03/31/2015 0946   CMP     Component Value Date/Time   NA 137 03/31/2015 0946   K 4.6 03/31/2015 0946   CL 101 03/31/2015 0946   CO2 29 03/31/2015 0946   GLUCOSE 107* 03/31/2015 0946   BUN 17 03/31/2015 0946   CREATININE 0.64 03/31/2015 0946   CREATININE 0.69 11/20/2013 1004   CALCIUM 9.5 03/31/2015 0946   PROT 7.9 03/31/2015 0946   ALBUMIN 4.0 03/31/2015 0946   AST 24 03/31/2015 0946   ALT 39 03/31/2015 0946   ALKPHOS 66 03/31/2015 0946   BILITOT 0.7 03/31/2015 0946   GFRNONAA >60 03/31/2015 0946   GFRAA >60 03/31/2015 0946     ASSESSMENT and THERAPY PLAN:  Stage I ER positive, PR positive, HER-2 negative carcinoma of the left breast. Osteoporosis Macrocytosis Low B12  She will currently continue on Arimidex. She has excellent tolerance. The goal is for 5 years of therapy. We can consider doing a breast cancer index to see if she will benefit from longer duration therapy.  She has been taking calcium and vitamin D. She is now on prolia.  We may need to consider monitoring a urine NTX. I have addressed this with her. She is willing to continue on the medication.  She is to continue on folic acid and Q98.   We will see her back in 3 months at the same visit as her next prolia.   Arimidex was refilled.  All questions were answered. The patient knows to call the clinic with any problems, questions or concerns. We can certainly see the patient much sooner if necessary.   This document serves as a record of services personally performed by Ancil Linsey, MD. It was created on  her behalf by Toni Amend, a trained medical scribe. The creation of this record is based on the scribe's personal observations and the provider's statements to them. This document has been checked and approved by the attending provider.  I have reviewed the above documentation for accuracy and completeness, and I agree with the above.  This note was electronically signed.  Kelby Fam. Kaidyn Hernandes, MD 03/31/2015

## 2015-04-01 LAB — VITAMIN D 25 HYDROXY (VIT D DEFICIENCY, FRACTURES): Vit D, 25-Hydroxy: 40.2 ng/mL (ref 30.0–100.0)

## 2015-04-05 ENCOUNTER — Other Ambulatory Visit: Payer: Self-pay

## 2015-04-05 ENCOUNTER — Other Ambulatory Visit: Payer: Self-pay | Admitting: Obstetrics & Gynecology

## 2015-04-05 DIAGNOSIS — C50912 Malignant neoplasm of unspecified site of left female breast: Secondary | ICD-10-CM

## 2015-04-07 ENCOUNTER — Ambulatory Visit
Admission: RE | Admit: 2015-04-07 | Discharge: 2015-04-07 | Disposition: A | Discharge status: Home / Self Care | Payer: BLUE CROSS/BLUE SHIELD | Source: Ambulatory Visit | Attending: Obstetrics & Gynecology | Admitting: Obstetrics & Gynecology

## 2015-04-07 DIAGNOSIS — C50912 Malignant neoplasm of unspecified site of left female breast: Secondary | ICD-10-CM

## 2015-04-10 ENCOUNTER — Encounter: Payer: Self-pay | Admitting: Family Medicine

## 2015-04-10 ENCOUNTER — Ambulatory Visit (INDEPENDENT_AMBULATORY_CARE_PROVIDER_SITE_OTHER): Payer: BLUE CROSS/BLUE SHIELD | Admitting: Family Medicine

## 2015-04-10 VITALS — BP 140/90 | Temp 99.0°F | Ht 64.0 in | Wt 180.4 lb

## 2015-04-10 DIAGNOSIS — R509 Fever, unspecified: Secondary | ICD-10-CM | POA: Diagnosis not present

## 2015-04-10 LAB — POCT URINALYSIS DIPSTICK
SPEC GRAV UA: 1.015
pH, UA: 5

## 2015-04-10 NOTE — Progress Notes (Signed)
   Subjective:    Patient ID: Beth Hamilton, female    DOB: 1956-03-05, 59 y.o.   MRN: [id number]  Fever  This is a new problem. The current episode started more than 1 month ago. The problem occurs constantly. The problem has been unchanged. The maximum temperature noted was 99 to 99.9 F. She has tried nothing for the symptoms. The treatment provided no relief.   Patient states that she has no other concerns at this time.  She states she is here to be checked because of her fever is been staying around 99 or 99 point in fact she states she does not even notice that she has any she does not feel that she has any illness but she is getting things checked out to be safe PMH breast cancer recently through lots treatment and testing see recent labs in electronic record  Review of Systems  Constitutional: Positive for fever.   she states occasionally she gets a sharp pain in the lower abdomen she denies any night sweats cough fever chills she denies nausea vomiting diarrhea or change in appetite. She states she feels good.     Objective:   Physical Exam Neck no masses throat normal lungs are clear no crackles heart regular abdomen soft no guarding rebound no tenderness extremities no edema       Assessment & Plan:  Low-grade temperature I doubt that this is a sign of any serious problem we will go ahead and check some lab work. May need further intervention currently right now don't feel other testing necessary urinalysis looks normal await the results of all the other testing  I did warn the patient if she started having high fevers sweats chills loss of appetite or other specific symptoms including coughing vomiting diarrhea bloody stools she needs to let us now. She does have a history of inflammatory bowel disease she may need to go back and see gastroenterology

## 2015-04-11 LAB — C-REACTIVE PROTEIN: CRP: 95.1 mg/L — ABNORMAL HIGH (ref 0.0–4.9)

## 2015-04-11 LAB — ANA: ANA: NEGATIVE

## 2015-04-11 LAB — SEDIMENTATION RATE: Sed Rate: 30 mm/hr (ref 0–40)

## 2015-04-12 LAB — URINE CULTURE

## 2015-04-14 ENCOUNTER — Telehealth: Payer: Self-pay | Admitting: Family Medicine

## 2015-04-14 ENCOUNTER — Other Ambulatory Visit: Payer: Self-pay | Admitting: Family Medicine

## 2015-04-14 MED ORDER — CEFPROZIL 500 MG PO TABS: 500.0000 mg | ORAL_TABLET | Freq: Two times a day (BID) | ORAL | Status: DC

## 2015-04-14 NOTE — Telephone Encounter (Signed)
Calling to get results to blood work that she recently had done.

## 2015-04-14 NOTE — Telephone Encounter (Signed)
Awaiting for infectious control to return call Dr.Scott.

## 2015-04-14 NOTE — Telephone Encounter (Signed)
Infectious Disease called back and stated that they are closed on Friday afternoons but we can page the infectious disease Doctor on call.

## 2015-04-16 NOTE — Telephone Encounter (Signed)
I talked with the patient. We put her on antibiotics. She will be repeating urine and lab work in approximately 10-14 days please see no dilatation with lab. (She will repeat urine culture, C-reactive protein, CBC in approximately 10-14 days)

## 2015-04-17 NOTE — Telephone Encounter (Signed)
Blood work ordered in [facility].

## 2015-04-17 NOTE — Addendum Note (Signed)
Addended by: Dairl Ponder on: 04/17/2015 09:44 AM   Modules accepted: Orders

## 2015-04-19 ENCOUNTER — Ambulatory Visit (INDEPENDENT_AMBULATORY_CARE_PROVIDER_SITE_OTHER): Payer: BLUE CROSS/BLUE SHIELD | Admitting: Family Medicine

## 2015-04-19 ENCOUNTER — Ambulatory Visit (HOSPITAL_COMMUNITY)
Admission: RE | Admit: 2015-04-19 | Discharge: 2015-04-19 | Disposition: A | Discharge status: Home / Self Care | Payer: BLUE CROSS/BLUE SHIELD | Source: Ambulatory Visit | Attending: Family Medicine | Admitting: Family Medicine

## 2015-04-19 VITALS — BP 150/90 | Temp 99.7°F | Ht 64.0 in | Wt 181.1 lb

## 2015-04-19 DIAGNOSIS — Z853 Personal history of malignant neoplasm of breast: Secondary | ICD-10-CM | POA: Diagnosis not present

## 2015-04-19 DIAGNOSIS — E039 Hypothyroidism, unspecified: Secondary | ICD-10-CM

## 2015-04-19 DIAGNOSIS — R918 Other nonspecific abnormal finding of lung field: Secondary | ICD-10-CM | POA: Insufficient documentation

## 2015-04-19 DIAGNOSIS — R05 Cough: Secondary | ICD-10-CM

## 2015-04-19 DIAGNOSIS — R509 Fever, unspecified: Secondary | ICD-10-CM

## 2015-04-19 DIAGNOSIS — R059 Cough, unspecified: Secondary | ICD-10-CM

## 2015-04-19 DIAGNOSIS — R0989 Other specified symptoms and signs involving the circulatory and respiratory systems: Secondary | ICD-10-CM | POA: Insufficient documentation

## 2015-04-19 LAB — POCT URINALYSIS DIPSTICK
Blood, UA: NEGATIVE
LEUKOCYTES UA: NEGATIVE
PH UA: 5
Spec Grav, UA: 1.02
Urobilinogen, UA: 2

## 2015-04-19 NOTE — Progress Notes (Signed)
   Subjective:    Patient ID: Beth Hamilton, female    DOB: 19-Nov-1955, 59 y.o.   MRN: [id number]  Fever  This is a recurrent problem. The current episode started 1 to 4 weeks ago. The maximum temperature noted was 100 to 100.9 F.   Patient in for recheck of fever from 9/19. C/o dry cough  Patient denies sweats or chills. Denies loss of appetite states energy level subpar at times patient states she is noted the elevated temperature for several weeks she isn't quite sure what to make of it. She has recently gone through cancer treatments plus also patient is on several medications.  Review of Systems  Constitutional: Positive for fever.  she denies runny nose sore throat vomiting diarrhea dysuria urinary frequency she does relate dry cough denies any recent travel     Objective:   Physical Exam Neck no masses lungs clear no crackles cough is noted dry cough. Heart regular pulse normal skin warm dry no rashes are noted flanks nontender Urinalysis negative for WBCs   We will go ahead and check chest x-ray when she does her lab work will be doing thyroid function    Assessment & Plan:  Persistent low-grade fever-physical exam find any specific evidence that should be causing this. She had a recent urine culture which showed 10-25,000 colonies of mixed flora which more than likely was not an infection but because of low-grade fever and her findings she was placed on antibiotics.  She is continuing to run low-grade fever I recommend for this patient to redo her urine culture next week plus also repeat CBC and C-reactive protein  I believe this patient would be best serve by seeing infectious disease to help rule out the possibility of underlying infection. It is possible it could be drug related infection but it could also be autoimmune but I doubt that that is the source

## 2015-04-21 MED ORDER — AZITHROMYCIN 250 MG PO TABS: ORAL_TABLET | ORAL | Status: DC

## 2015-04-21 NOTE — Addendum Note (Signed)
Addended by: Dairl Ponder on: 04/21/2015 09:46 AM   Modules accepted: Orders

## 2015-04-25 ENCOUNTER — Other Ambulatory Visit: Payer: Self-pay | Admitting: *Deleted

## 2015-04-25 ENCOUNTER — Telehealth: Payer: Self-pay | Admitting: Family Medicine

## 2015-04-25 MED ORDER — AMOXICILLIN-POT CLAVULANATE 875-125 MG PO TABS: 1.0000 | ORAL_TABLET | Freq: Two times a day (BID) | ORAL | Status: DC

## 2015-04-25 NOTE — Telephone Encounter (Signed)
Patient states still running low grade fever,finished antibotic today, still has a cough.And wanting something else called in at [facility].

## 2015-04-25 NOTE — Telephone Encounter (Signed)
Discussed with pt. Pt states today temp is 99.1 -99.5. Still having a cough. Some phelm. Not sure of the color because she doesn't spit it up. Med sent to [facility]. Waiting to hear back from infectious disease specialist. Info has been sent over.

## 2015-04-25 NOTE — Telephone Encounter (Signed)
#  1 please document how she is doing and let me know. Temperature is? Productive cough? #2 Augmentin 875 one twice a day with the #20 #3 very important for patient to follow-up x-ray in approximately 2-3 weeks #4 please ask Izora Ribas the status of referral to infectious disease thank you

## 2015-04-25 NOTE — Telephone Encounter (Signed)
Notation noted. Brendale, infectious disease needs to put their nickel down. Ideally we need an appointment with them in October. Please call their office. If I need to speak with one of the doctors I will be happy to do so.

## 2015-04-27 LAB — CBC WITH DIFFERENTIAL/PLATELET
BASOS ABS: 0 10*3/uL (ref 0.0–0.2)
BASOS: 0 %
EOS (ABSOLUTE): 0.2 10*3/uL (ref 0.0–0.4)
Eos: 2 %
HEMOGLOBIN: 11.3 g/dL (ref 11.1–15.9)
Hematocrit: 34.9 % (ref 34.0–46.6)
IMMATURE GRANS (ABS): 0 10*3/uL (ref 0.0–0.1)
IMMATURE GRANULOCYTES: 0 %
LYMPHS: 9 %
Lymphocytes Absolute: 1 10*3/uL (ref 0.7–3.1)
MCH: 30.8 pg (ref 26.6–33.0)
MCHC: 32.4 g/dL (ref 31.5–35.7)
MCV: 95 fL (ref 79–97)
MONOCYTES: 7 %
Monocytes Absolute: 0.8 10*3/uL (ref 0.1–0.9)
NEUTROS ABS: 9.4 10*3/uL — AB (ref 1.4–7.0)
NEUTROS PCT: 82 %
Platelets: 520 10*3/uL — ABNORMAL HIGH (ref 150–379)
RBC: 3.67 x10E6/uL — ABNORMAL LOW (ref 3.77–5.28)
RDW: 12.6 % (ref 12.3–15.4)
WBC: 11.5 10*3/uL — ABNORMAL HIGH (ref 3.4–10.8)

## 2015-04-27 LAB — C-REACTIVE PROTEIN: CRP: 124.8 mg/L — AB (ref 0.0–4.9)

## 2015-04-27 LAB — T4: T4 TOTAL: 8.7 ug/dL (ref 4.5–12.0)

## 2015-04-27 LAB — URINE CULTURE: ORGANISM ID, BACTERIA: NO GROWTH

## 2015-04-27 LAB — TSH: TSH: 3.31 u[IU]/mL (ref 0.450–4.500)

## 2015-05-01 ENCOUNTER — Telehealth: Payer: Self-pay | Admitting: Internal Medicine

## 2015-05-01 ENCOUNTER — Encounter: Payer: Self-pay | Admitting: Internal Medicine

## 2015-05-01 ENCOUNTER — Telehealth: Payer: Self-pay | Admitting: Family Medicine

## 2015-05-01 MED ORDER — PREDNISONE 5 MG PO TABS: ORAL_TABLET | ORAL | Status: DC

## 2015-05-01 NOTE — Telephone Encounter (Signed)
rx sent to the pharmacy. Pt is aware.  Please schedule ov in 6 weeks.

## 2015-05-01 NOTE — Telephone Encounter (Signed)
PATIENT WANTS PREDNISONE CALLED INTO HER PHARMACY     STATES SHE CALLED LAST WEEK   [phone]

## 2015-05-01 NOTE — Telephone Encounter (Signed)
APPT MADE AND LETTER SENT  °

## 2015-05-01 NOTE — Addendum Note (Signed)
Addended by: Claudina Lick on: 05/01/2015 02:09 PM   Modules accepted: Orders

## 2015-05-01 NOTE — Telephone Encounter (Signed)
I spoke with the pt- she said she has had UC since 2007 and she knows when she is about to have a "flare". She said she is not feeling right and her stomach is starting to hurt. Small amount of diarrhea, no blood in her stool. She said [facility] always sends in prednisone when she needs it. The pt wouldn't give me any more information than this, she kept saying she knows when she needs prednisone and usually [facility] sends it in for her.  Please advise.

## 2015-05-01 NOTE — Telephone Encounter (Signed)
I spoke with the patient regarding her condition. She continues to run fevers up to 100.5 and continues to have a cough her recent white count slightly elevated. I had spoke with infectious disease doctor. I was under the impression they were going to work her in somewhere in the next 2-3 weeks. Now her appointment is not until right before Thanksgiving. Patient very stressed and distraught. Patient dealing with breast cancer as well. It would be in the patient's best interest for her to be seen sooner even if needing to be seen in [place]. I would prefer for the patient to be seen somewhere within the next 2-3 weeks. Please see what you can do at moving this up.

## 2015-05-01 NOTE — Telephone Encounter (Signed)
Patient wants to talk with you about her condition. She didn't go into detail but she want you to call her after 5 or when you got threw with your patients this [phone].

## 2015-05-01 NOTE — Telephone Encounter (Signed)
Okay; prescription for prednisone as follows: 20 mg daily 10 days then 15 mg daily 5 days then 10 mg daily 5 days then 5 mg daily 5 days then off. No refills.  The patient not much improved in 10 days, she is to let us know. Office visit 6 weeks.

## 2015-05-02 NOTE — Telephone Encounter (Signed)
Any thoughts on how to get this moved up patient very worried, it does surprise me given that I did speak with the infectious disease specialist. The patient and myself expected the office visit to be in 2-3 weeks at the most not at the end of November. Patient would be willing to go to [facility] to see them if they have more availability there. Please let you know thank you

## 2015-05-03 NOTE — Telephone Encounter (Signed)
Patient's appointment is in [place] and since you've already spoken with their provider I'm not sure of anything else that can be done to move appointment up We could try to refer to [facility] (will probably have to fax all records for them to review which could take time) Please advise

## 2015-05-03 NOTE — Telephone Encounter (Signed)
Brendale, thank you for your efforts. Please give me the phone number for the infectious disease clinic in [facility] regarding appointments thank you

## 2015-05-04 NOTE — Telephone Encounter (Signed)
[phone]  ° °

## 2015-05-19 ENCOUNTER — Other Ambulatory Visit: Payer: BLUE CROSS/BLUE SHIELD | Admitting: Obstetrics & Gynecology

## 2015-05-19 ENCOUNTER — Other Ambulatory Visit (HOSPITAL_COMMUNITY)
Admission: RE | Admit: 2015-05-19 | Discharge: 2015-05-19 | Disposition: A | Discharge status: Home / Self Care | Payer: BLUE CROSS/BLUE SHIELD | Source: Ambulatory Visit | Attending: Adult Health | Admitting: Adult Health

## 2015-05-19 ENCOUNTER — Ambulatory Visit (INDEPENDENT_AMBULATORY_CARE_PROVIDER_SITE_OTHER): Payer: BLUE CROSS/BLUE SHIELD | Admitting: Adult Health

## 2015-05-19 ENCOUNTER — Encounter: Payer: Self-pay | Admitting: Adult Health

## 2015-05-19 VITALS — BP 160/90 | HR 80 | Ht 63.5 in | Wt 179.0 lb

## 2015-05-19 DIAGNOSIS — Z01419 Encounter for gynecological examination (general) (routine) without abnormal findings: Secondary | ICD-10-CM | POA: Diagnosis present

## 2015-05-19 DIAGNOSIS — Z1151 Encounter for screening for human papillomavirus (HPV): Secondary | ICD-10-CM | POA: Diagnosis not present

## 2015-05-19 DIAGNOSIS — Z1211 Encounter for screening for malignant neoplasm of colon: Secondary | ICD-10-CM

## 2015-05-19 DIAGNOSIS — Z853 Personal history of malignant neoplasm of breast: Secondary | ICD-10-CM

## 2015-05-19 HISTORY — DX: Personal history of malignant neoplasm of breast: Z85.3

## 2015-05-19 LAB — HEMOCCULT GUIAC POC 1CARD (OFFICE): Fecal Occult Blood, POC: NEGATIVE

## 2015-05-19 NOTE — Progress Notes (Signed)
Patient ID: PEJA Beth Hamilton, female   DOB: 1956-03-12, 59 y.o.   MRN: [id number] History of Present Illness: Beth Hamilton is a 59 year old white female, married in for a well woman gyn exam and pap.She has a history of breast cancer and most recently pneumonia.She has IBD.She had a fever in September and was placed on prednisone and fever resolved,had elevated CRP and has appt 11/22 to see infectious disease doctor. PCP is [facility] Dr Gala Romney.   Current Medications, Allergies, Past Medical History, Past Surgical History, Family History and Social History were reviewed in [employer] record.     Review of Systems: Patient denies any headaches, hearing loss, fatigue, blurred vision, shortness of breath, chest pain, abdominal pain, problems with bowel movements, urination, or intercourse. No joint pain or mood swings.She has taken BP meds but anxious about this visit.    Physical Exam:BP 160/90 mmHg  Pulse 80  Ht 5' 3.5" (1.613 m)  Wt 179 lb (81.194 kg)  BMI 31.21 kg/m2 General:  Well developed, well nourished, no acute distress Skin:  Warm and dry Neck:  Midline trachea, normal thyroid, good ROM, no lymphadenopathy Lungs; Clear to auscultation bilaterally Breast:  No dominant palpable mass, retraction, or nipple discharge,has scar left breast at 10 o'clock where had biopsy, and then radiation,left breast is slightly smaller and skin texture has changed Cardiovascular: Regular rate and rhythm Abdomen:  Soft, non tender, no hepatosplenomegaly Pelvic:  External genitalia is normal in appearance, no lesions.  The vagina has decreased color, moisture and rugae. Urethra has no lesions or masses. The cervix is smooth and atrophic, pap with HPV performed.  Uterus is felt to be normal size, shape, and contour.  No adnexal masses or tenderness noted.Bladder is non tender, no masses felt. Rectal: Good sphincter tone, no polyps, or hemorrhoids felt.  Hemoccult  negative. Extremities/musculoskeletal:  No swelling or varicosities noted, no clubbing or cyanosis Psych:  No mood changes, alert and cooperative,seems happy   Impression: Well woman gyn exam and pap History of breast cancer    Plan: Follow up with [name] about repeat chest xray Labs with PCP Physical in 1 year,pap in 3 if normal with negative HPV Colonoscopy per GI Mammogram yearly

## 2015-05-19 NOTE — Patient Instructions (Signed)
Physical in 1 year Mammogram yearly Get F/U chest xray [facility] Colonoscopy per GO labs with PCP

## 2015-05-24 LAB — CYTOLOGY - PAP

## 2015-05-24 IMAGING — MG MM DIGITAL DIAGNOSTIC UNILAT*L*
2 series · 2 of 2 positions shown · non-contrast
Comparison: Previous exams.

CLINICAL DATA: Screening recall for a possible left breast mass.

EXAM:
DIGITAL DIAGNOSTIC  LEFT MAMMOGRAM WITH CAD
ULTRASOUND LEFT BREAST

[L CC]
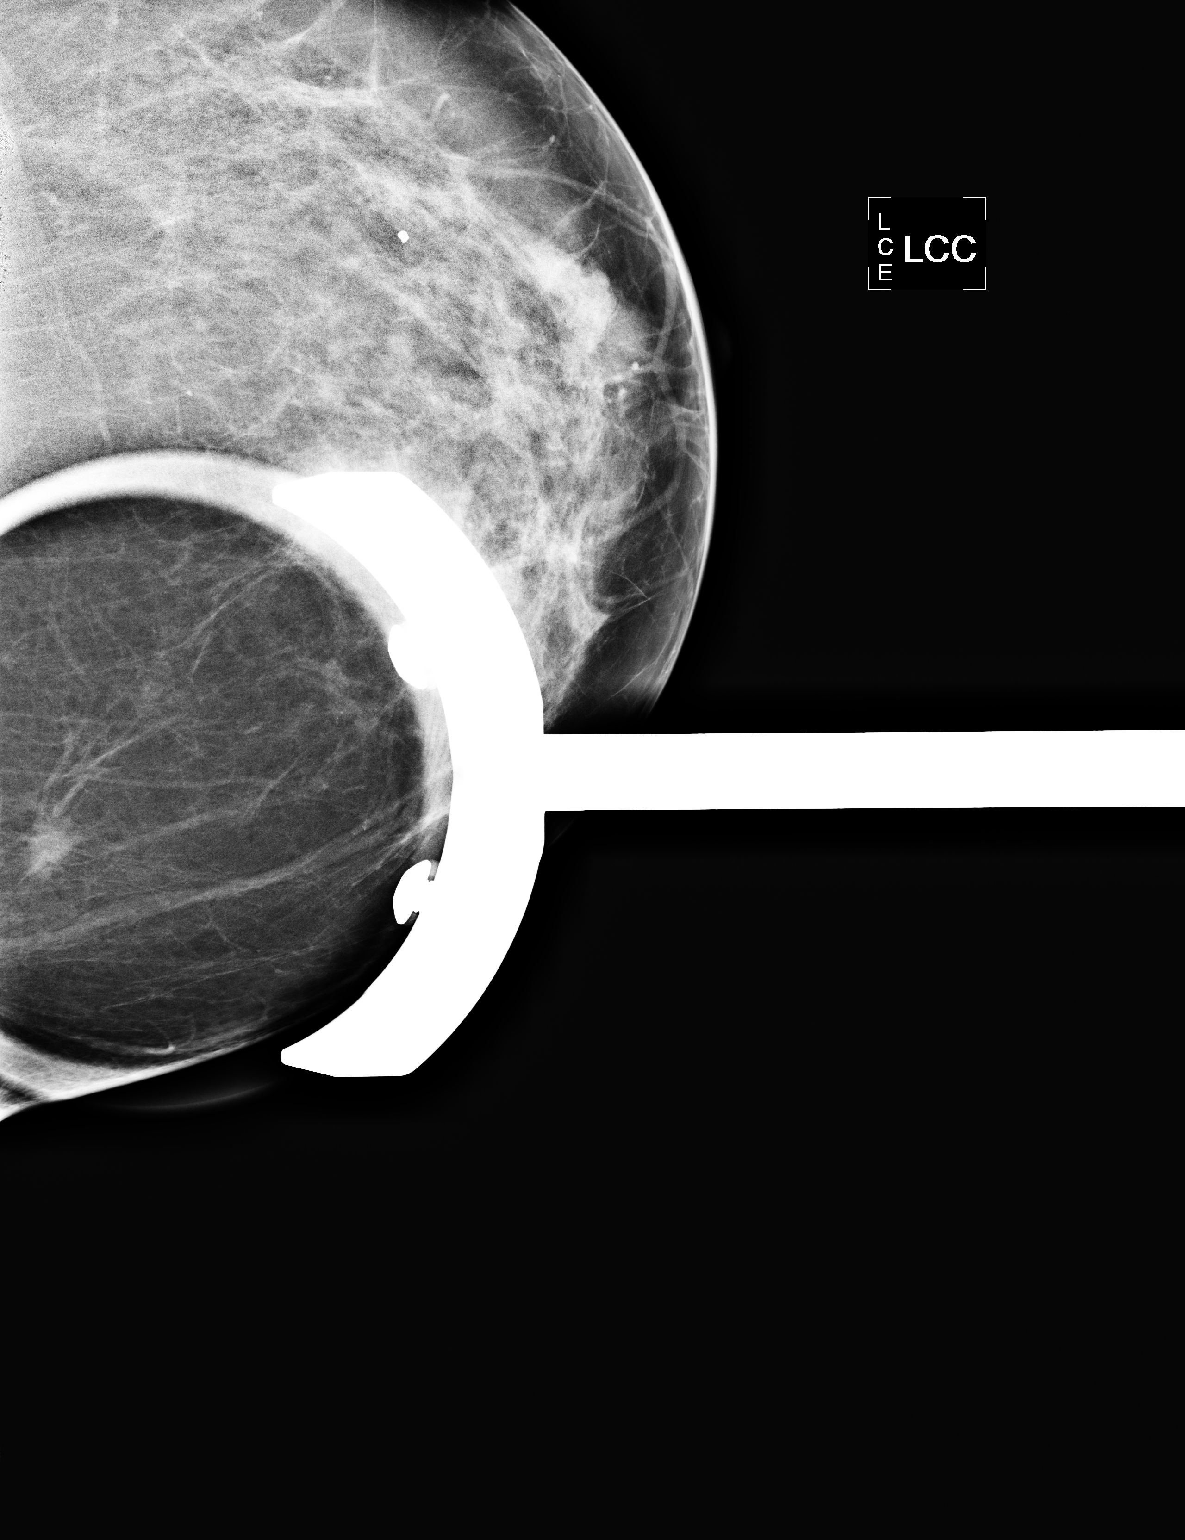

[L MLO]
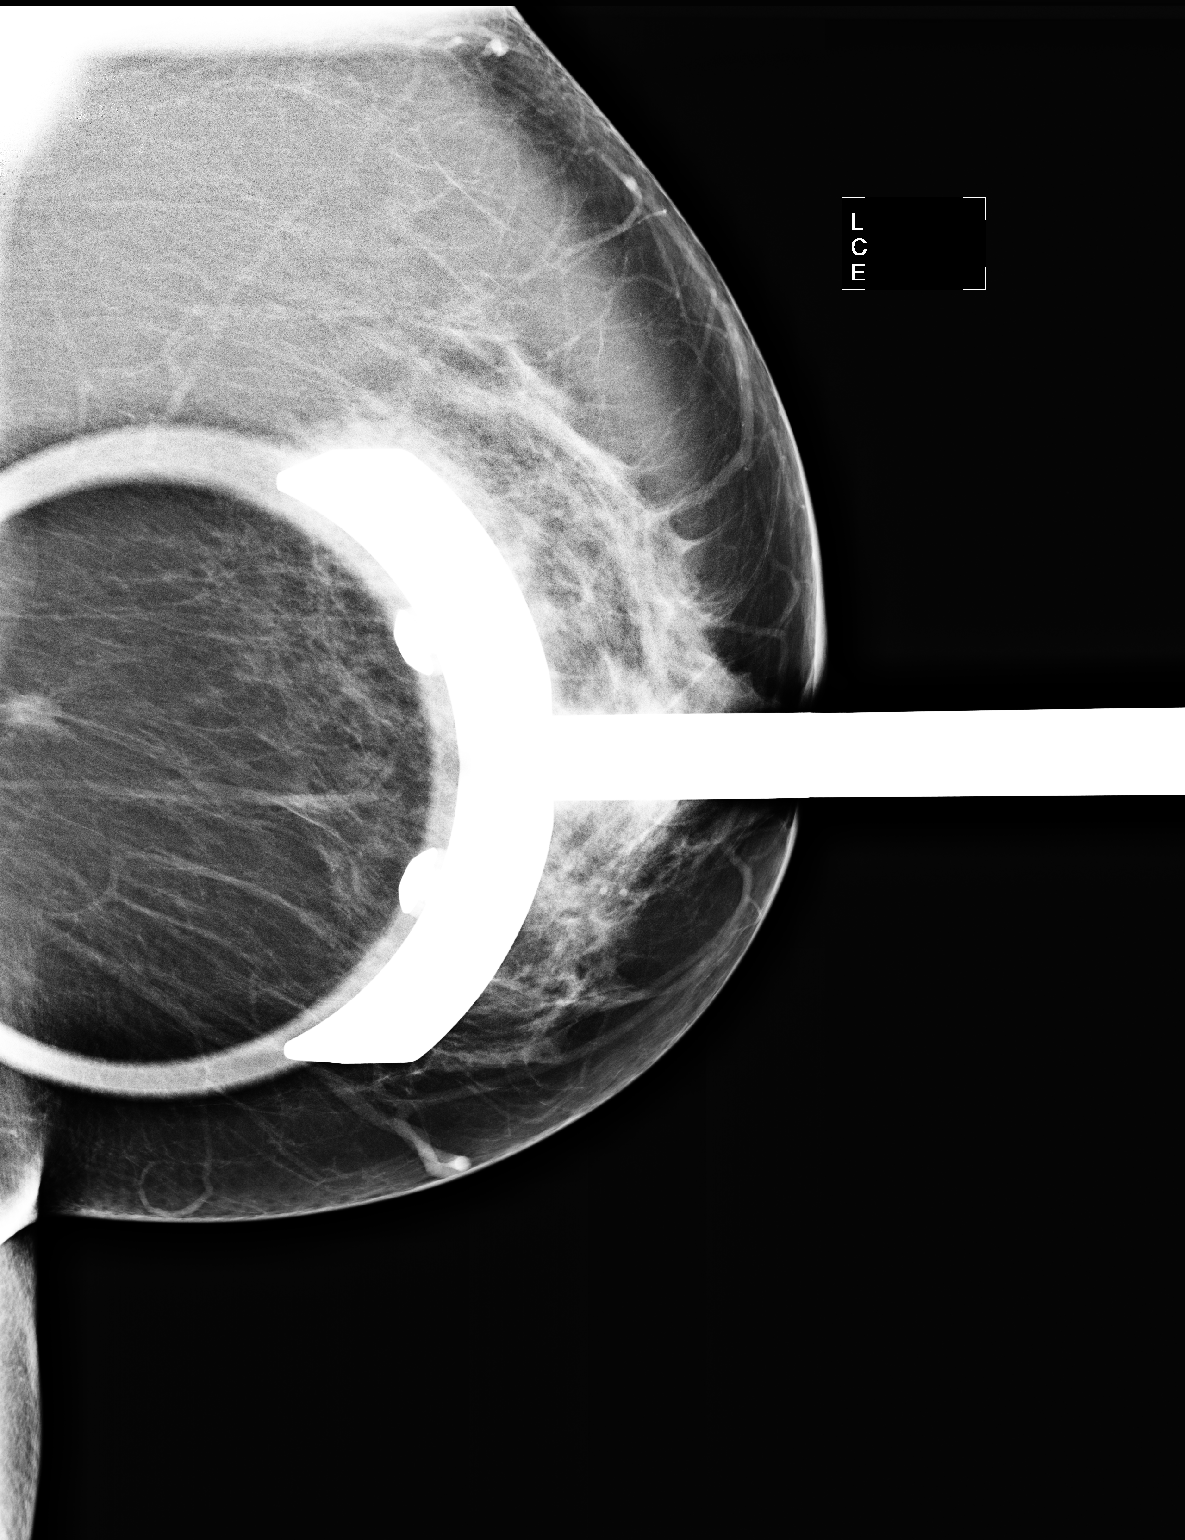

[2 of 2 positions shown; findings below may reference images not displayed]

ACR Breast Density Category c: The breast tissue is heterogeneously
dense, which may obscure small masses.
FINDINGS: Spot compression CC and MLO views of the left breast were performed
demonstrating a mass with irregular margins slightly lower inner
left breast far posterior depth measuring approximately 7 mm.

Mammographic images were processed with CAD.

Physical examination of the inner left breast does not reveal any
palpable masses.

Targeted ultrasound of the left breast was performed demonstrating
an irregular hypoechoic mass at 9 o'clock 4-5 cm from the nipple
measuring 0.7 by 0.7 x 0.7 cm. No lymphadenopathy seen in the left
axilla.
IMPRESSION: Suspicious left breast mass.

RECOMMENDATION:
Ultrasound-guided biopsy of the suspicious mass in the inner left
breast at 9 o'clock is recommended. This will be subsequently
performed and dictated separately.

I have discussed the findings and recommendations with the patient.
Results were also provided in writing at the conclusion of the
visit. If applicable, a reminder letter will be sent to the patient
regarding the next appointment.

BI-RADS CATEGORY  4: Suspicious.

## 2015-05-24 IMAGING — US US BREAST*L* LIMITED INC AXILLA
1 series · 13 of 15 positions shown · non-contrast
Comparison: Previous exams.

CLINICAL DATA: Screening recall for a possible left breast mass.

EXAM:
DIGITAL DIAGNOSTIC  LEFT MAMMOGRAM WITH CAD
ULTRASOUND LEFT BREAST

[Series 1: superficial breast · 13 of 15 slices shown]
[im 1/15]
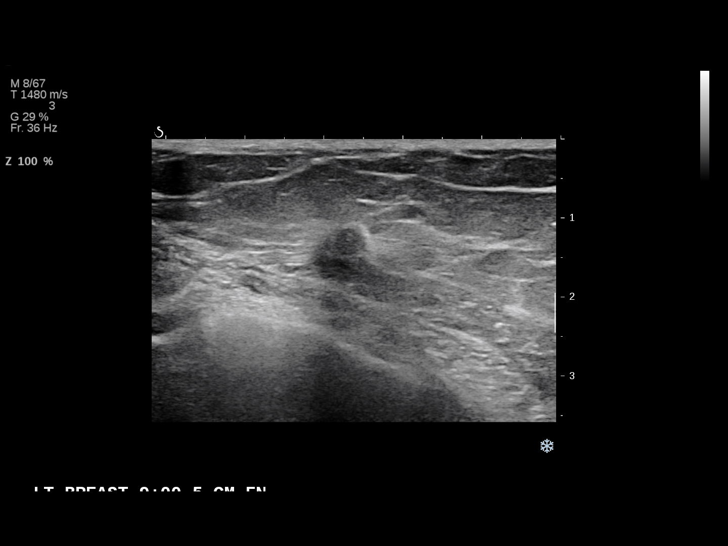
[im 2/15]
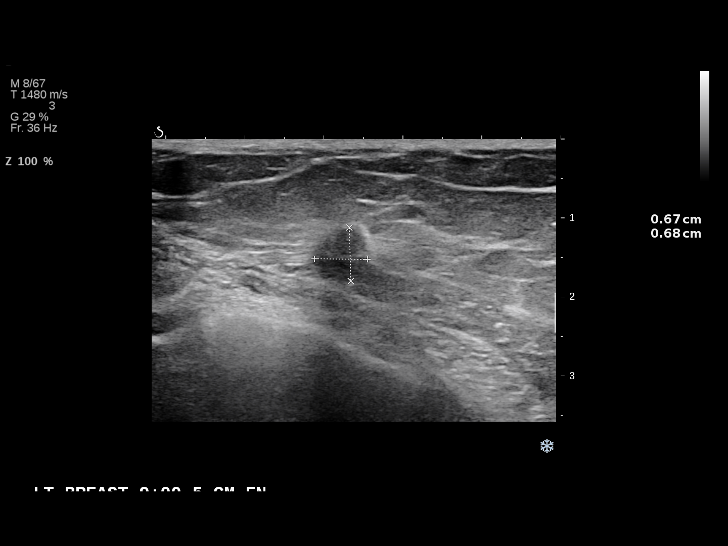
[im 3/15]
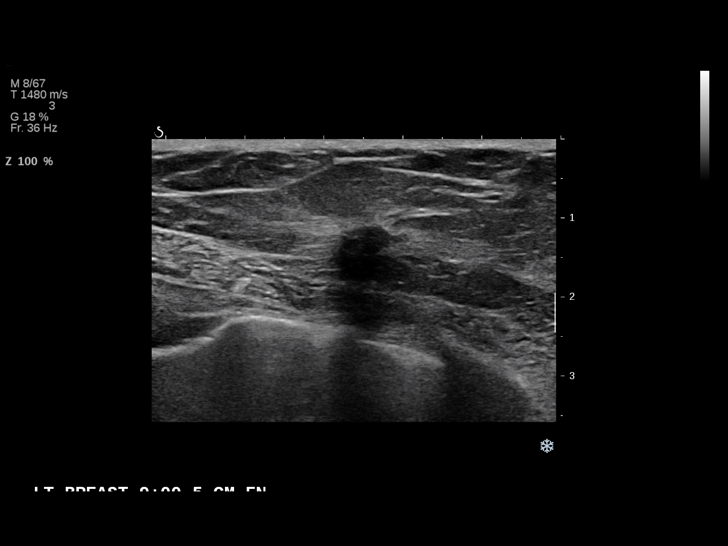
[im 5/15]
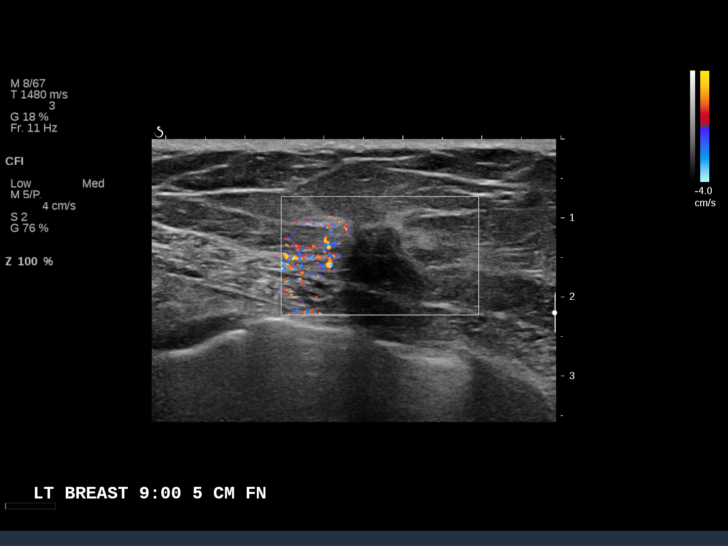
[im 6/15]
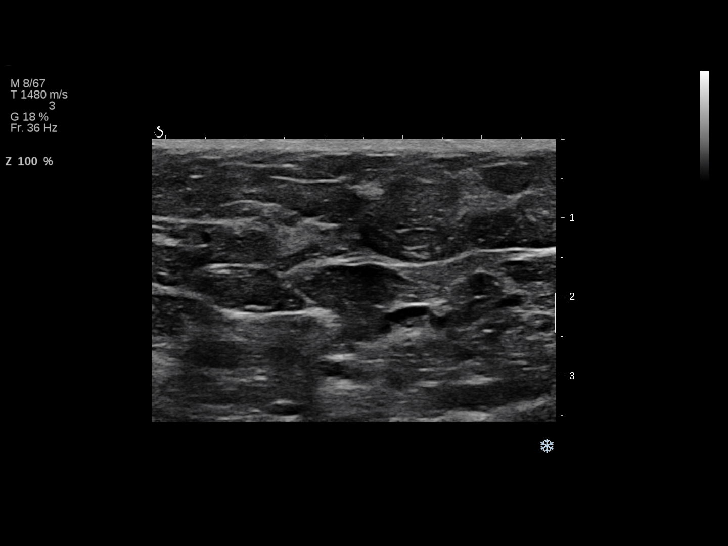
[im 7/15]
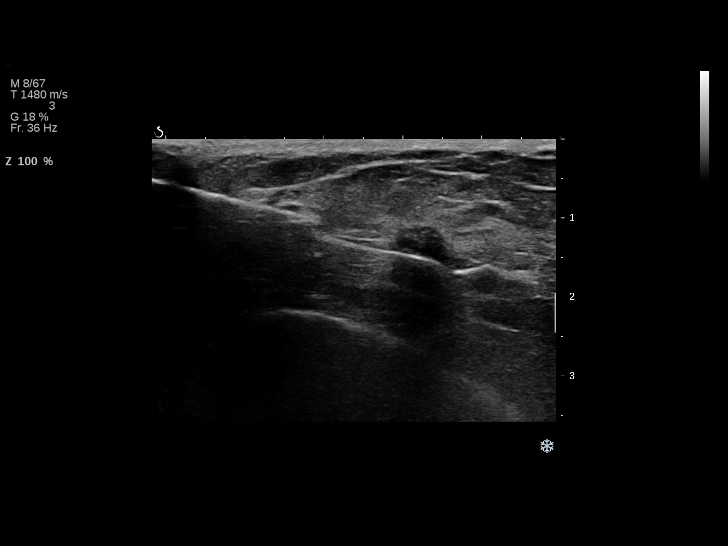
[im 8/15]
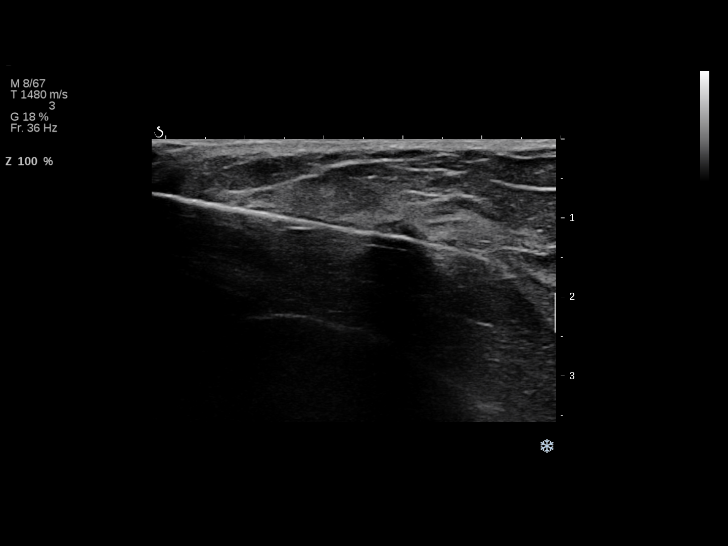
[im 9/15]
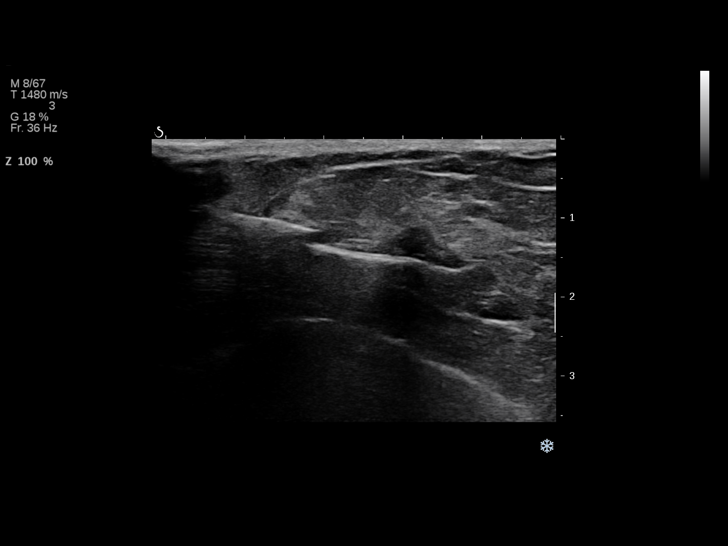
[im 10/15]
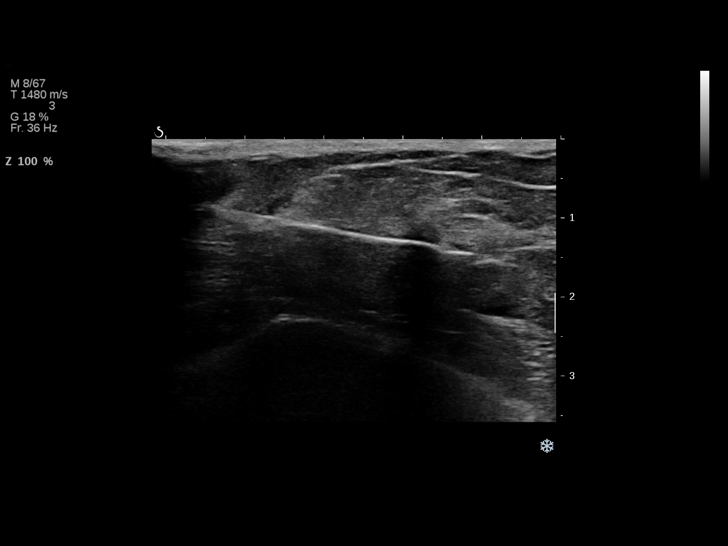
[im 11/15]
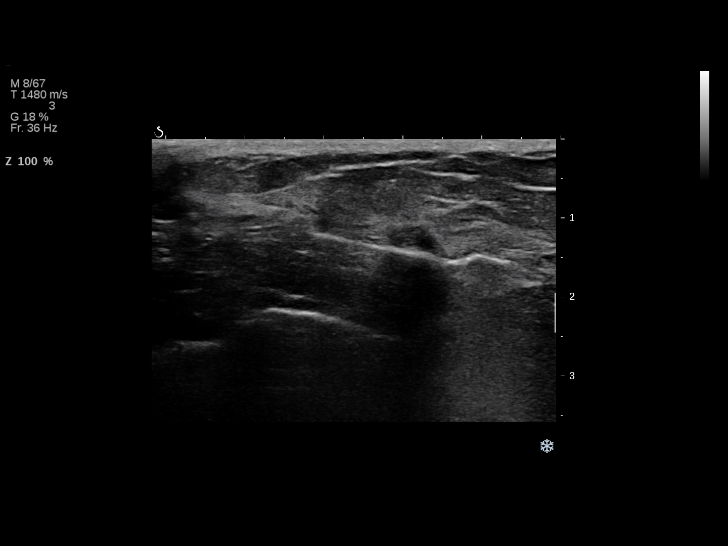
[im 13/15]
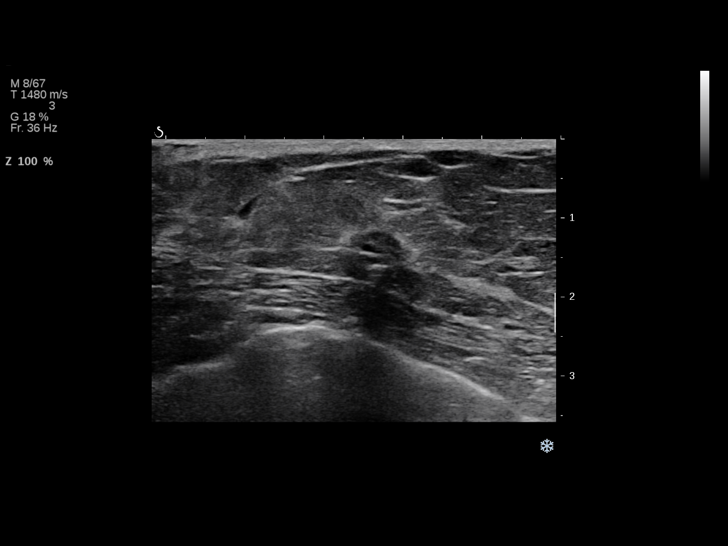
[im 14/15]
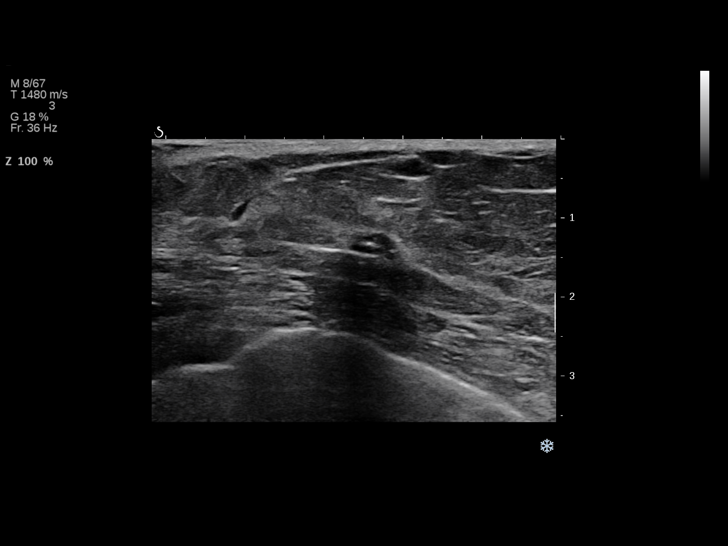
[im 15/15]
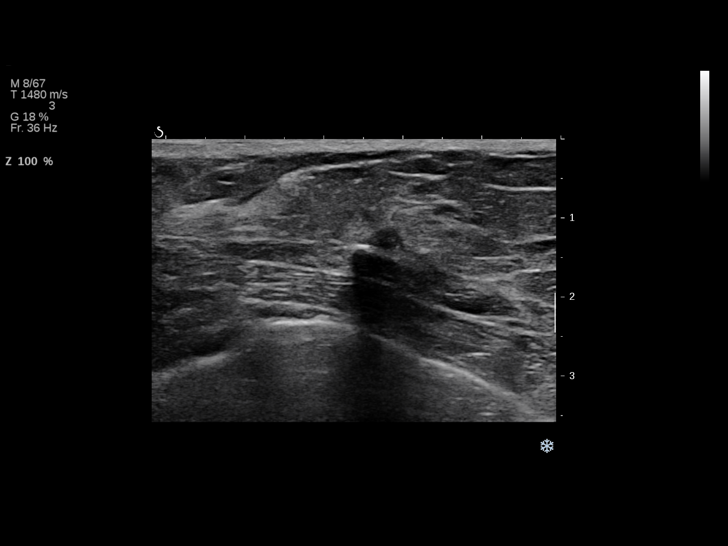

[13 of 15 positions shown; findings below may reference images not displayed]

ACR Breast Density Category c: The breast tissue is heterogeneously
dense, which may obscure small masses.
FINDINGS: Spot compression CC and MLO views of the left breast were performed
demonstrating a mass with irregular margins slightly lower inner
left breast far posterior depth measuring approximately 7 mm.

Mammographic images were processed with CAD.

Physical examination of the inner left breast does not reveal any
palpable masses.

Targeted ultrasound of the left breast was performed demonstrating
an irregular hypoechoic mass at 9 o'clock 4-5 cm from the nipple
measuring 0.7 by 0.7 x 0.7 cm. No lymphadenopathy seen in the left
axilla.
IMPRESSION: Suspicious left breast mass.

RECOMMENDATION:
Ultrasound-guided biopsy of the suspicious mass in the inner left
breast at 9 o'clock is recommended. This will be subsequently
performed and dictated separately.

I have discussed the findings and recommendations with the patient.
Results were also provided in writing at the conclusion of the
visit. If applicable, a reminder letter will be sent to the patient
regarding the next appointment.

BI-RADS CATEGORY  4: Suspicious.

## 2015-06-07 ENCOUNTER — Encounter: Payer: Self-pay | Admitting: Family Medicine

## 2015-06-07 ENCOUNTER — Ambulatory Visit (INDEPENDENT_AMBULATORY_CARE_PROVIDER_SITE_OTHER): Payer: BLUE CROSS/BLUE SHIELD | Admitting: Family Medicine

## 2015-06-07 VITALS — BP 134/88 | Ht 64.0 in | Wt 183.0 lb

## 2015-06-07 DIAGNOSIS — I10 Essential (primary) hypertension: Secondary | ICD-10-CM

## 2015-06-07 DIAGNOSIS — M810 Age-related osteoporosis without current pathological fracture: Secondary | ICD-10-CM | POA: Diagnosis not present

## 2015-06-07 DIAGNOSIS — E1169 Type 2 diabetes mellitus with other specified complication: Secondary | ICD-10-CM | POA: Insufficient documentation

## 2015-06-07 DIAGNOSIS — E785 Hyperlipidemia, unspecified: Secondary | ICD-10-CM | POA: Insufficient documentation

## 2015-06-07 MED ORDER — LISINOPRIL 10 MG PO TABS: 10.0000 mg | ORAL_TABLET | Freq: Every day | ORAL | Status: DC

## 2015-06-07 NOTE — Progress Notes (Signed)
   Subjective:    Patient ID: Beth Hamilton, female    DOB: 12/22/1955, 59 y.o.   MRN: [id number]  Hypertension This is a chronic problem. The current episode started more than 1 year ago. The problem has been gradually improving since onset. Pertinent negatives include no chest pain. There are no associated agents to hypertension. There are no known risk factors for coronary artery disease. Treatments tried: lisinopril. The current treatment provides moderate improvement. There are no compliance problems.    Patient states she has no concerns at this time.  2007- ulcerative colitis Flu vaccine- declined  Patient states that she was treated for a flareup of ulcerative colitis and she felt much better fevers of gone away she is not having any further troubles and therefore no need to do any further testing.  Review of Systems  Constitutional: Negative for activity change, appetite change and fatigue.  HENT: Negative for congestion.   Respiratory: Negative for cough.   Cardiovascular: Negative for chest pain.  Gastrointestinal: Negative for abdominal pain.  Endocrine: Negative for polydipsia and polyphagia.  Neurological: Negative for weakness.  Psychiatric/Behavioral: Negative for confusion.       Objective:   Physical Exam  Constitutional: She appears well-nourished. No distress.  Cardiovascular: Normal rate, regular rhythm and normal heart sounds.   No murmur heard. Pulmonary/Chest: Effort normal and breath sounds normal. No respiratory distress.  Musculoskeletal: She exhibits no edema.  Lymphadenopathy:    She has no cervical adenopathy.  Neurological: She is alert. She exhibits normal muscle tone.  Psychiatric: Her behavior is normal.  Vitals reviewed.         Assessment & Plan:  Hypertension overall good control continue current measures. Watch diet recommend follow-up in 6 months time  I do recommend lipid profile patient will do within the next 30  days  Ulcerative colitis under better control fevers are gone no need for further workup. May cancel appointment with infectious disease.

## 2015-06-08 IMAGING — MG MM BREAST SURGICAL SPECIMEN
1 series · 1 of 1 positions shown · non-contrast
Comparison: Preoperative mammogram 04/20/2014

CLINICAL DATA: LEFT breast cancer, lumpectomy post preoperative
needle localization

EXAM:
SPECIMEN RADIOGRAPH OF THE LEFT BREAST

[L SPECIMEN]
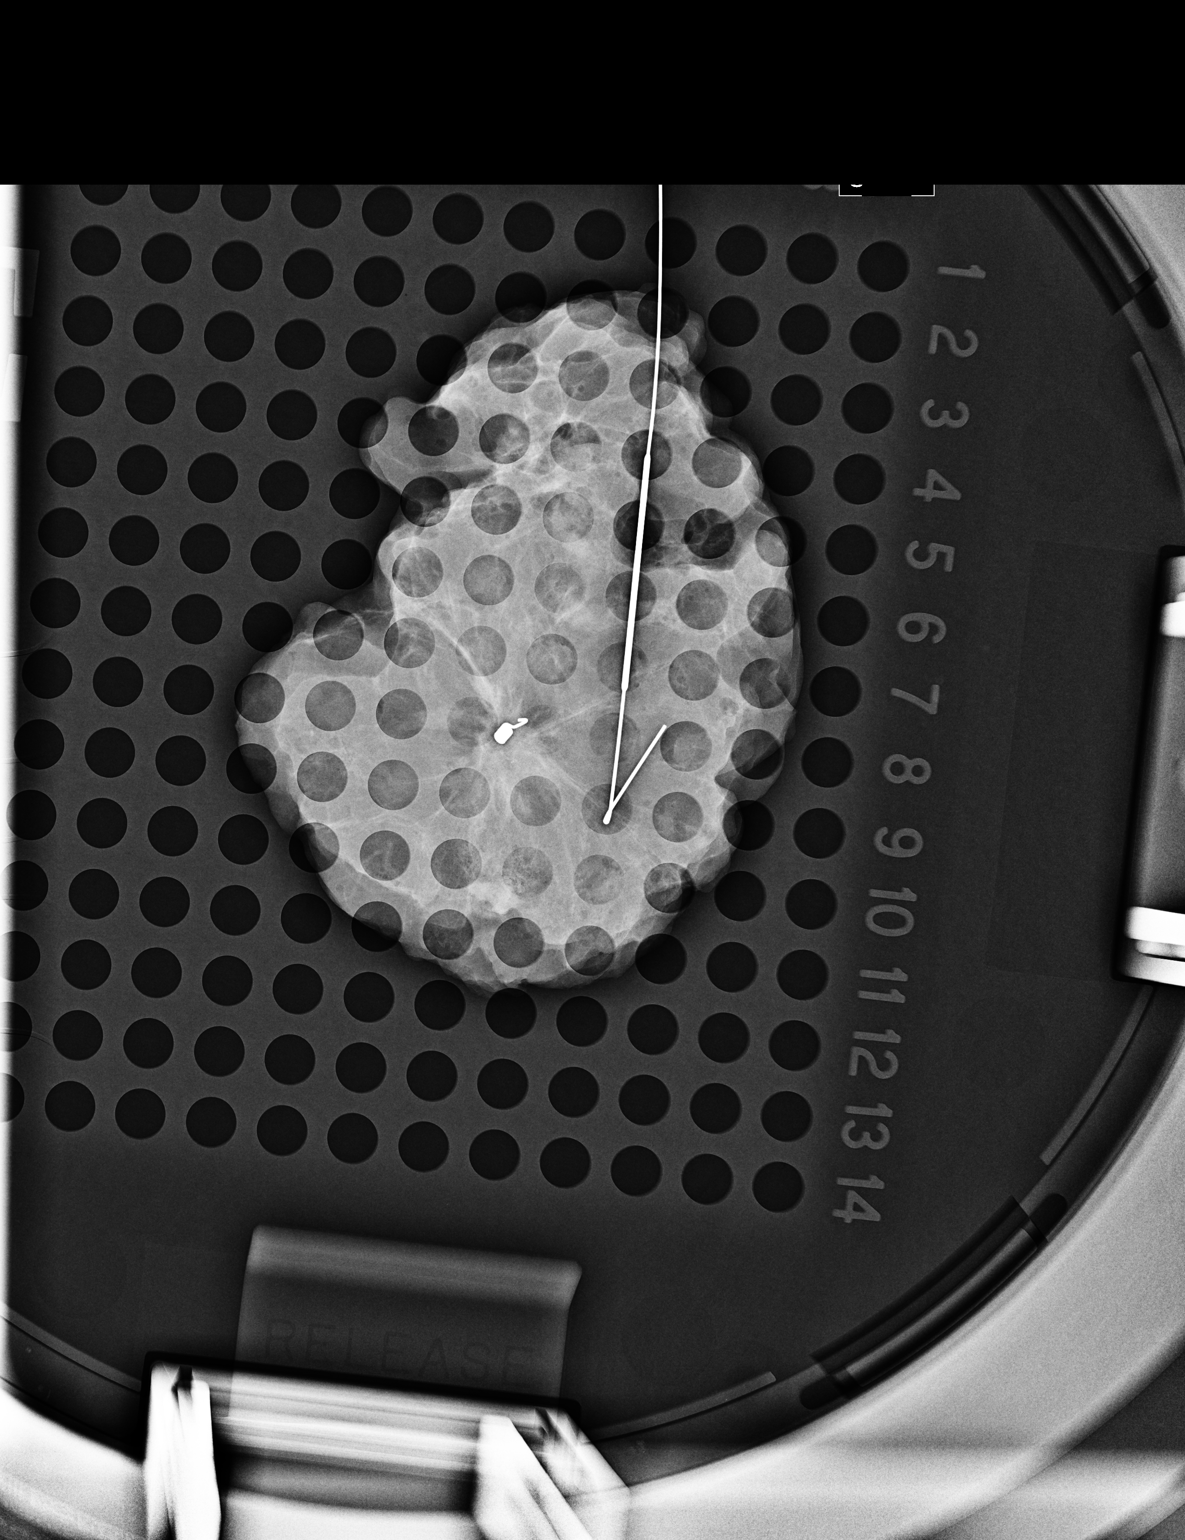

[1 of 1 positions shown; findings below may reference images not displayed]

FINDINGS: Status post excision of the LEFT breast.

The wire tip and biopsy marker clip are present.

The stellate density which was preoperatively localized is contained
within the specimen.
IMPRESSION: Specimen radiograph of the LEFT breast containing the biopsy clip
and preoperative localized stellate density.

## 2015-06-10 ENCOUNTER — Other Ambulatory Visit: Payer: Self-pay | Admitting: Gastroenterology

## 2015-06-13 ENCOUNTER — Ambulatory Visit (INDEPENDENT_AMBULATORY_CARE_PROVIDER_SITE_OTHER): Payer: BLUE CROSS/BLUE SHIELD | Admitting: Internal Medicine

## 2015-06-13 ENCOUNTER — Ambulatory Visit: Payer: BLUE CROSS/BLUE SHIELD | Admitting: Internal Medicine

## 2015-06-13 ENCOUNTER — Encounter: Payer: Self-pay | Admitting: Internal Medicine

## 2015-06-13 VITALS — BP 158/97 | HR 92 | Temp 97.6°F | Ht 63.0 in | Wt 184.8 lb

## 2015-06-13 DIAGNOSIS — K523 Indeterminate colitis: Secondary | ICD-10-CM

## 2015-06-13 NOTE — Progress Notes (Signed)
Primary Care Physician:  [name], MD Primary Gastroenterologist:  Dr. Gala Romney  Pre-Procedure History & Physical: HPI:  Beth Hamilton is a 59 y.o. female here for follow-up of indeterminate colitis (likely UC).  Apparently, developed pneumonia and urinary tract infection recently. Took Motrin for fever. Developed some abdominal discomfort felt she was on the front end of a flare of her colitis. She was given a brief course of prednisone with rapid resolution of her symptoms. She has no abdominal pain at this point. 1-2 formed bowel movements daily no blood per rectum. Feels well. Patient states she's not taken pneumonia or flu vaccine.  Serum creatinine normal in September of this year.  Past Medical History  Diagnosis Date  . IBD (inflammatory bowel disease)   . Hemorrhoids   . Psoriasis   . Thyroid disease   . Ulcerative colitis ([place]) 5 1 2007    Dr Gala Romney  . Complication of anesthesia     not sure what med was given in 96 with D&C, was very sore all over. Dr Patsey Berthold did anesthesia. Dr Heide Spark did surgery  . Hypertension   . Hypothyroidism   . Breast cancer ([place]) 04/20/14    left breast  . History of breast cancer 05/19/2015  . Pneumonia     Past Surgical History  Procedure Laterality Date  . Wisdom tooth extraction    . Colonoscopy  10/09/2001    [facility]: Internal hemorrhoids and anal papilla; otherwise normal rectum  . Colonoscopy  12/10/2005    incomplete tcs-  colitis  . Flexible sigmoidoscopy  02/26/2006    endoscopically normal-appearing rectum, colitis in sigmoid mucosa  . Tubal ligation    . Dilation and curettage of uterus  1996    Dr Heide Spark  . Colonoscopy N/A 04/18/2014    Dr.Mackenze Grandison- abnormal rectum & L colon into the mid descending segment. mucosa was erythematious as well as pale w/ some loss of normal vascular apttern. no erosions or ulcers. pt had some smooth peduculated 3-4cm polyps in the sigmoid segment most c/w pseudopolyps. the rest of the colonic mucosa  appeared normal. bx= inflammatory polyps  . Partial mastectomy with needle localization and axillary sentinel lymph node bx Left 04/20/2014    Procedure: PARTIAL MASTECTOMY AFTER NEEDLE LOCALIZATION AND AXILLARY SENTINEL LYMPH NODE BX;  Surgeon: Jamesetta So, MD;  Location: [facility];  Service: General;  Laterality: Left;    Prior to Admission medications   Medication Sig Start Date End Date Taking? Authorizing Provider  anastrozole (ARIMIDEX) 1 MG tablet Take 1 tablet (1 mg total) by mouth daily. 03/31/15  Yes Patrici Ranks, MD  ARMOUR THYROID 30 MG tablet Take 30 mg by mouth daily.  07/11/12  Yes Historical Provider, MD  calcium-vitamin D (OSCAL WITH D) 500-200 MG-UNIT per tablet Take 1 tablet by mouth 2 (two) times daily.   Yes Historical Provider, MD  Cyanocobalamin (VITAMIN B 12 PO) Take by mouth. Sublingual-once daily   Yes Historical Provider, MD  FOLIC ACID PO Take by mouth daily.   Yes Historical Provider, MD  LIALDA 1.2 G EC tablet TAKE 4 TABLETS BY MOUTH DAILY. 06/12/15  Yes Mahala Menghini, PA-C  lisinopril (PRINIVIL,ZESTRIL) 10 MG tablet Take 1 tablet (10 mg total) by mouth daily. 06/07/15  Yes [name], MD    Allergies as of 06/13/2015 - Review Complete 06/13/2015  Allergen Reaction Noted  . Flagyl [metronidazole]  10/29/2013  . Other Other (See Comments) 03/22/2014  . Levaquin [levofloxacin in  d5w]  07/23/2013    Family History  Problem Relation Age of Onset  . Colon cancer Neg Hx   . Stroke Maternal Grandfather   . Heart attack Maternal Grandfather   . Hypertension Mother   . Diabetes Paternal Grandfather   . Heart disease Paternal Grandfather     Social History   Social History  . Marital Status: Married    Spouse Name: N/A  . Number of Children: N/A  . Years of Education: N/A   Occupational History  . Not on file.   Social History Main Topics  . Smoking status: Former Smoker -- 0.50 packs/day for 15 years    Types: Cigarettes    Quit date:  07/26/2005  . Smokeless tobacco: Never Used  . Alcohol Use: Yes     Comment: wine every day  . Drug Use: No  . Sexual Activity: Yes    Birth Control/ Protection: Post-menopausal   Other Topics Concern  . Not on file   Social History Narrative    Review of Systems: See HPI, otherwise negative ROS  Physical Exam: BP 158/97 mmHg  Pulse 92  Temp(Src) 97.6 F (36.4 C)  Ht 5' 3"  (1.6 m)  Wt 184 lb 12.8 oz (83.825 kg)  BMI 32.74 kg/m2 General:   Alert,  Well-developed, well-nourished, pleasant and cooperative in NAD Skin:  Intact without significant lesions or rashes. Neck:  Supple; no masses or thyromegaly. No significant cervical adenopathy. Lungs:  Clear throughout to auscultation.   No wheezes, crackles, or rhonchi. No acute distress. Heart:  Regular rate and rhythm; no murmurs, clicks, rubs,  or gallops. Abdomen: obese.  Non-distended, normal bowel sounds.  Soft and nontender without appreciable mass or hepatosplenomegaly.  Pulses:  Normal pulses noted. Extremities:  Without clubbing or edema.  Impression:  Recent flare of colitis. Temporally related to the antibiotic treatment and NSAID administration. Brief course of prednisone squelched her GI symptoms. She is back to baseline. NSAID therapy may have had much to do with her recent GI symptoms.  She seems to be doing very well at this time from a GI standpoint.  Recommendations: Continue Lialda 4.8 grams daily  Avoid NSAID drugs  I recommend  flu and pneumonia vaccine  Office visit in 1 year  Call if any interim problems   Notice: This dictation was prepared with Dragon dictation along with smaller phrase technology. Any transcriptional errors that result from this process are unintentional and may not be corrected upon review.

## 2015-06-13 NOTE — Patient Instructions (Addendum)
Continue Lialda 4.8 grams daily  Avoid NSAID drugs  I recommend you get a flu shot and the pneumonia vaccine  Office visit in 1 year  Call if any interim problems

## 2015-06-14 LAB — LIPID PANEL
CHOL/HDL RATIO: 4.1 ratio (ref 0.0–4.4)
Cholesterol, Total: 243 mg/dL — ABNORMAL HIGH (ref 100–199)
HDL: 59 mg/dL (ref >39)
LDL Calculated: 145 mg/dL — ABNORMAL HIGH (ref 0–99)
Triglycerides: 196 mg/dL — ABNORMAL HIGH (ref 0–149)
VLDL Cholesterol Cal: 39 mg/dL (ref 5–40)

## 2015-06-15 ENCOUNTER — Encounter: Payer: Self-pay | Admitting: Family Medicine

## 2015-06-26 ENCOUNTER — Telehealth: Payer: Self-pay

## 2015-06-26 NOTE — Telephone Encounter (Signed)
Not enough information to warrant another prescription of prednisone. Let's offer her a visit this week with one of the extenders and just have a face-to-face encounter.

## 2015-06-26 NOTE — Telephone Encounter (Signed)
Tried to call pts cell number x 2 it was busy both times.

## 2015-06-26 NOTE — Telephone Encounter (Signed)
Pt called- she has been off the prednisone for 1 month and now is starting to have the fever again like she had the last time she had a flare. She said it is only 99.5 at the moment. She is scared she will get "run down" again and wants to know if she can have an rx for prednisone sent in. No pain, no diarrhea. 1-2 normal bms a day. Some soreness in her abd when she presses on it. She said if you want her to come in for an office visit, she can only come in the early am or late pm. She is at work, we can leave a message on her cell if she doesn't answer- [phone].  1- pt wants rx for prednisone 2- she wants to know if she could be becoming immune to Lialda, she has been on it since 2007.  3- does she need ov- she was just seen on 06/13/15

## 2015-06-26 NOTE — Telephone Encounter (Signed)
Spoke with pt- put her on the schedule for 06/28/15 at 3:30 with [facility]. Pt is ok with this appt and she is aware that it is with [facility] and not with [facility]. pts cell number is incorrect in my previous note. It is [phone].

## 2015-06-28 ENCOUNTER — Other Ambulatory Visit: Payer: Self-pay

## 2015-06-28 ENCOUNTER — Encounter: Payer: Self-pay | Admitting: Gastroenterology

## 2015-06-28 ENCOUNTER — Ambulatory Visit (INDEPENDENT_AMBULATORY_CARE_PROVIDER_SITE_OTHER): Payer: BLUE CROSS/BLUE SHIELD | Admitting: Gastroenterology

## 2015-06-28 VITALS — BP 150/96 | HR 100 | Temp 98.5°F | Ht 63.0 in | Wt 185.2 lb

## 2015-06-28 DIAGNOSIS — K5180 Other ulcerative colitis without complications: Secondary | ICD-10-CM | POA: Diagnosis not present

## 2015-06-28 DIAGNOSIS — K51919 Ulcerative colitis, unspecified with unspecified complications: Secondary | ICD-10-CM

## 2015-06-28 LAB — CBC
HEMATOCRIT: 36.8 % (ref 36.0–46.0)
HEMOGLOBIN: 12.1 g/dL (ref 12.0–15.0)
MCH: 30.9 pg (ref 26.0–34.0)
MCHC: 32.9 g/dL (ref 30.0–36.0)
MCV: 94.1 fL (ref 78.0–100.0)
MPV: 10.3 fL (ref 8.6–12.4)
Platelets: 349 10*3/uL (ref 150–400)
RBC: 3.91 MIL/uL (ref 3.87–5.11)
RDW: 16.5 % — AB (ref 11.5–15.5)
WBC: 10.9 10*3/uL — ABNORMAL HIGH (ref 4.0–10.5)

## 2015-06-28 MED ORDER — PREDNISONE 10 MG PO TABS: ORAL_TABLET | ORAL | Status: DC

## 2015-06-28 NOTE — Progress Notes (Signed)
Referring Provider: [name], MD Primary Care Physician:  [name], MD  Primary GI: Dr. Gala Romney   Chief Complaint  Patient presents with  . UC flare.    HPI:   Beth Hamilton is a 59 y.o. female presenting today with a history of indeterminate colitis but likely UC, last seen just a few weeks ago in our office. Last round of prednisone Oct 2016. Called in 2 days ago stating she felt ilke she was having a flare but was without pain or diarrhea. BM was 1-2 normal BMs per day.   States temperature is 98.8, 98.7. Tmax of 100. Bowels are not solid but soft. Not loose or diarrhea. Has BM about 1-2 times per day but not more than 2. No rectal bleeding. Comes out in a pile. Feels like she may be a little sore down in lower abdomen. Will be seeing Infectious Disease in near future regarding fevers. Taking Lialda.   Past Medical History  Diagnosis Date  . IBD (inflammatory bowel disease)   . Hemorrhoids   . Psoriasis   . Thyroid disease   . Ulcerative colitis ([place]) 5 1 2007    Dr Gala Romney  . Complication of anesthesia     not sure what med was given in 96 with D&C, was very sore all over. Dr Patsey Berthold did anesthesia. Dr Heide Spark did surgery  . Hypertension   . Hypothyroidism   . Breast cancer ([place]) 04/20/14    left breast  . History of breast cancer 05/19/2015  . Pneumonia     Past Surgical History  Procedure Laterality Date  . Wisdom tooth extraction    . Colonoscopy  10/09/2001    RMR: Internal hemorrhoids and anal papilla; otherwise normal rectum  . Colonoscopy  12/10/2005    incomplete tcs-  colitis  . Flexible sigmoidoscopy  02/26/2006    endoscopically normal-appearing rectum, colitis in sigmoid mucosa  . Tubal ligation    . Dilation and curettage of uterus  1996    Dr Heide Spark  . Colonoscopy N/A 04/18/2014    Dr.Rourk- abnormal rectum & L colon into the mid descending segment. mucosa was erythematious as well as pale w/ some loss of normal vascular apttern. no erosions  or ulcers. pt had some smooth peduculated 3-4cm polyps in the sigmoid segment most c/w pseudopolyps. the rest of the colonic mucosa appeared normal. bx= inflammatory polyps  . Partial mastectomy with needle localization and axillary sentinel lymph node bx Left 04/20/2014    Procedure: PARTIAL MASTECTOMY AFTER NEEDLE LOCALIZATION AND AXILLARY SENTINEL LYMPH NODE BX;  Surgeon: Jamesetta So, MD;  Location: [facility];  Service: General;  Laterality: Left;    Current Outpatient Prescriptions  Medication Sig Dispense Refill  . anastrozole (ARIMIDEX) 1 MG tablet Take 1 tablet (1 mg total) by mouth daily. 90 tablet 3  . ARMOUR THYROID 30 MG tablet Take 30 mg by mouth daily.     . calcium-vitamin D (OSCAL WITH D) 500-200 MG-UNIT per tablet Take 1 tablet by mouth 2 (two) times daily.    . Cyanocobalamin (VITAMIN B 12 PO) Take by mouth. Sublingual-once daily    . FOLIC ACID PO Take by mouth daily.    Marland Kitchen LIALDA 1.2 G EC tablet TAKE 4 TABLETS BY MOUTH DAILY. 120 tablet 11  . lisinopril (PRINIVIL,ZESTRIL) 10 MG tablet Take 1 tablet (10 mg total) by mouth daily. 30 tablet 6   No current facility-administered medications for this visit.    Allergies as  of 06/28/2015 - Review Complete 06/28/2015  Allergen Reaction Noted  . Flagyl [metronidazole]  10/29/2013  . Other Other (See Comments) 03/22/2014  . Levaquin [levofloxacin in d5w]  07/23/2013    Family History  Problem Relation Age of Onset  . Colon cancer Neg Hx   . Stroke Maternal Grandfather   . Heart attack Maternal Grandfather   . Hypertension Mother   . Diabetes Paternal Grandfather   . Heart disease Paternal Grandfather     Social History   Social History  . Marital Status: Married    Spouse Name: N/A  . Number of Children: N/A  . Years of Education: N/A   Social History Main Topics  . Smoking status: Former Smoker -- 0.50 packs/day for 15 years    Types: Cigarettes    Quit date: 07/26/2005  . Smokeless tobacco: Never Used  .  Alcohol Use: Yes     Comment: wine every day  . Drug Use: No  . Sexual Activity: Yes    Birth Control/ Protection: Post-menopausal   Other Topics Concern  . None   Social History Narrative    Review of Systems: As mentioned in HPI   Physical Exam: BP 150/96 mmHg  Pulse 100  Temp(Src) 98.5 F (36.9 C)  Ht 5' 3"  (1.6 m)  Wt 185 lb 3.2 oz (84.006 kg)  BMI 32.81 kg/m2 General:   Alert and oriented. No distress noted. Pleasant and cooperative.  Head:  Normocephalic and atraumatic. Eyes:  Conjuctiva clear without scleral icterus. Mouth:  Oral mucosa pink and moist. Good dentition. No lesions. Abdomen:  +BS, soft, non-tender and non-distended. No rebound or guarding. No HSM or masses noted. Msk:  Symmetrical without gross deformities. Normal posture. Extremities:  Without edema. Neurologic:  Alert and  oriented x4;  grossly normal neurologically. Psych:  Alert and cooperative. Normal mood and affect.  Oct 2016:  CRP 124

## 2015-06-28 NOTE — Patient Instructions (Signed)
Please have blood work done today.   I have gone ahead and sent in the prednisone to the pharmacy. You can start taking it now.   I would like to have the CT performed this week, but they can't do this until Friday.   Further recommendations to follow.

## 2015-06-29 LAB — C-REACTIVE PROTEIN: CRP: 9.5 mg/dL — ABNORMAL HIGH (ref <0.60)

## 2015-06-30 ENCOUNTER — Ambulatory Visit (HOSPITAL_COMMUNITY): Payer: BLUE CROSS/BLUE SHIELD

## 2015-06-30 ENCOUNTER — Other Ambulatory Visit (HOSPITAL_COMMUNITY): Payer: BLUE CROSS/BLUE SHIELD

## 2015-07-07 ENCOUNTER — Other Ambulatory Visit (HOSPITAL_COMMUNITY): Payer: BLUE CROSS/BLUE SHIELD

## 2015-07-07 ENCOUNTER — Encounter (HOSPITAL_COMMUNITY): Payer: BLUE CROSS/BLUE SHIELD

## 2015-07-07 ENCOUNTER — Encounter (HOSPITAL_COMMUNITY): Payer: BLUE CROSS/BLUE SHIELD | Attending: Hematology & Oncology

## 2015-07-07 ENCOUNTER — Encounter (HOSPITAL_COMMUNITY): Payer: Self-pay | Admitting: Hematology & Oncology

## 2015-07-07 ENCOUNTER — Ambulatory Visit (HOSPITAL_COMMUNITY)
Admission: RE | Admit: 2015-07-07 | Discharge: 2015-07-07 | Disposition: A | Discharge status: Home / Self Care | Payer: BLUE CROSS/BLUE SHIELD | Source: Ambulatory Visit | Attending: Gastroenterology | Admitting: Gastroenterology

## 2015-07-07 ENCOUNTER — Encounter (HOSPITAL_BASED_OUTPATIENT_CLINIC_OR_DEPARTMENT_OTHER): Payer: BLUE CROSS/BLUE SHIELD | Admitting: Hematology & Oncology

## 2015-07-07 VITALS — BP 140/73 | HR 65 | Temp 98.2°F | Resp 18 | Wt 187.6 lb

## 2015-07-07 DIAGNOSIS — R509 Fever, unspecified: Secondary | ICD-10-CM

## 2015-07-07 DIAGNOSIS — K449 Diaphragmatic hernia without obstruction or gangrene: Secondary | ICD-10-CM | POA: Insufficient documentation

## 2015-07-07 DIAGNOSIS — Z853 Personal history of malignant neoplasm of breast: Secondary | ICD-10-CM | POA: Insufficient documentation

## 2015-07-07 DIAGNOSIS — K5190 Ulcerative colitis, unspecified, without complications: Secondary | ICD-10-CM | POA: Insufficient documentation

## 2015-07-07 DIAGNOSIS — K51919 Ulcerative colitis, unspecified with unspecified complications: Secondary | ICD-10-CM

## 2015-07-07 DIAGNOSIS — C50512 Malignant neoplasm of lower-outer quadrant of left female breast: Secondary | ICD-10-CM

## 2015-07-07 DIAGNOSIS — D7589 Other specified diseases of blood and blood-forming organs: Secondary | ICD-10-CM

## 2015-07-07 DIAGNOSIS — B379 Candidiasis, unspecified: Secondary | ICD-10-CM

## 2015-07-07 DIAGNOSIS — M810 Age-related osteoporosis without current pathological fracture: Secondary | ICD-10-CM

## 2015-07-07 DIAGNOSIS — C50912 Malignant neoplasm of unspecified site of left female breast: Secondary | ICD-10-CM | POA: Diagnosis not present

## 2015-07-07 DIAGNOSIS — I10 Essential (primary) hypertension: Secondary | ICD-10-CM

## 2015-07-07 DIAGNOSIS — I708 Atherosclerosis of other arteries: Secondary | ICD-10-CM | POA: Diagnosis not present

## 2015-07-07 LAB — COMPREHENSIVE METABOLIC PANEL
ALT: 24 U/L (ref 14–54)
ANION GAP: 10 (ref 5–15)
AST: 24 U/L (ref 15–41)
Albumin: 4.1 g/dL (ref 3.5–5.0)
Alkaline Phosphatase: 79 U/L (ref 38–126)
BILIRUBIN TOTAL: 0.3 mg/dL (ref 0.3–1.2)
BUN: 26 mg/dL — ABNORMAL HIGH (ref 6–20)
CALCIUM: 9.4 mg/dL (ref 8.9–10.3)
CHLORIDE: 101 mmol/L (ref 101–111)
CO2: 29 mmol/L (ref 22–32)
Creatinine, Ser: 0.87 mg/dL (ref 0.44–1.00)
GFR calc non Af Amer: >60 mL/min (ref >60)
GLUCOSE: 86 mg/dL (ref 65–99)
POTASSIUM: 4.2 mmol/L (ref 3.5–5.1)
Sodium: 140 mmol/L (ref 135–145)
Total Protein: 7.8 g/dL (ref 6.5–8.1)

## 2015-07-07 MED ORDER — DENOSUMAB 60 MG/ML ~~LOC~~ SOLN
60.0000 mg | Freq: Once | SUBCUTANEOUS | Status: DC
  Filled 2015-07-07: qty 1

## 2015-07-07 MED ORDER — BARIUM SULFATE 0.1 % PO SUSP: 450.0000 mL | Freq: Once | ORAL | Status: DC

## 2015-07-07 MED ORDER — FLUCONAZOLE 100 MG PO TABS: 100.0000 mg | ORAL_TABLET | Freq: Every day | ORAL | Status: DC

## 2015-07-07 MED ORDER — IOHEXOL 300 MG/ML  SOLN
125.0000 mL | Freq: Once | INTRAMUSCULAR | Status: AC | PRN
  Administered 2015-07-07: 125 mL via INTRAVENOUS

## 2015-07-07 MED ORDER — NYSTATIN 100000 UNIT/GM EX POWD
Freq: Two times a day (BID) | CUTANEOUS | Status: DC
  Filled 2015-07-07: qty 15

## 2015-07-07 MED ORDER — NYSTATIN 100000 UNIT/GM EX POWD: CUTANEOUS | Status: DC

## 2015-07-07 NOTE — Progress Notes (Signed)
Patient not to get injection per Dr.  Muse instruction.

## 2015-07-07 NOTE — Patient Instructions (Addendum)
[place] at [facility] Discharge Instructions  RECOMMENDATIONS MADE BY THE CONSULTANT AND ANY TEST RESULTS WILL BE SENT TO YOUR REFERRING PHYSICIAN.  Exam completed by Dr Whitney Muse today We are going to hold prolia today unitl your fever resolves.  Once your fever resolves call us and we can get your prolia scheduled. Still keep taking your other meds Calcium is 9.4 Calcium with Vitamin D Clinical breast exam today Return to see the doctor in 3 months Diflucan sent to your pharmacy for 7 days please take all of these. Nystatin powder to apply to your rash twice a day under your breast. Please call the clinic if you have any questions or concerns   Thank you for choosing [place] at [facility] to provide your oncology and hematology care.  To afford each patient quality time with our provider, please arrive at least 15 minutes before your scheduled appointment time.    You need to re-schedule your appointment should you arrive 10 or more minutes late.  We strive to give you quality time with our providers, and arriving late affects you and other patients whose appointments are after yours.  Also, if you no show three or more times for appointments you may be dismissed from the clinic at the providers discretion.     Again, thank you for choosing [facility].  Our hope is that these requests will decrease the amount of time that you wait before being seen by our physicians.       _____________________________________________________________  Should you have questions after your visit to [facility] please contact our office at [phone] between the hours of 8:30 a.m. and 4:30 p.m.  Voicemails left after 4:30 p.m. will not be returned until the following business day.  For prescription refill requests, have your pharmacy contact our office.

## 2015-07-07 NOTE — Progress Notes (Signed)
Beth Lange, MD [place] [address]    DIAGNOSIS: Breast cancer of lower-outer quadrant of left female breast   Staging form: Breast, AJCC 7th Edition     Clinical: No stage assigned - Unsigned  Stage I (pT1b pNOMx) infiltrating ductal carcinoma of the left breast, status post lumpectomy and axillary sentinel node sampling, ER/PR positive, HER-2/neu not overexpressed, Oncotype DX score of 7, adjuvant XRT. On Arimidex.  Biopsy was performed on 04/05/2014 at which time a combination of infiltrating ductal carcinoma and noninvasive ductal neoplasia were found, ER 100%, PR 100%  Ulcerative colitis, controlled.  DEXA 05/2014 with osteoporosis COLONOSCOPY 04/18/2014  SUMMARY OF ONCOLOGIC HISTORY:   Breast cancer of lower-outer quadrant of left female breast ([place])   05/24/2014 Initial Diagnosis Breast cancer of lower-outer quadrant of left female breast   06/22/2014 - 08/05/2014 Radiation Therapy Left breast 50 Gy at 2 Gy per fraction x 25 fractions with left breast boost 10 Gy at 2 Gy per fraction x 5 fractions by Dr. Pablo Ledger.    CURRENT THERAPY: Arimidex  INTERVAL HISTORY: Beth Hamilton 59 y.o. female returns for follow-up of a stage I Left breast cancer. She is currently on Arimidex. DEXA scan performed in November 2015 showed osteoporosis.   Beth Hamilton returns to the cancer center alone today. She did not take her medications this morning because of her CT scheduled for this afternoon. She notes that her BP is high.   Overall, she looks and feels good today. She has few complaints, but does have one singular concern.   She comments on a fever that began in June. At the time, she had a constant temperature of around 99.1. As September came about, she started feeling run down and the fever started "consuming her." She describes the experience as having a fever every day, "consuming her body," and she felt crappy. She was put on prednisone to treat this  nothing that it was felt it may be her UC and she felt fine, stating that "the minute she gets on prednisone, her fever goes away."   At present, she's been off of the prednisone for a month, and states that the fever has started coming back. She wants to knock it out.  She has a history of ulcerative colitis. When she was first diagnosed with ulcerative colitis, she reports having an ongoing fever. This relates to her present concerns with the fever.  She has a CT scan scheduled for today to check her intestines and colon. In terms of her bowels, she says she goes to the bathroom maybe once or twice a day.  Today she denies any hedaches, and has no stiff neck bothering her. She also recently went to her thyroid doctor, and states that he checked all of her "lymph nodes and glands" as well as thyroid function, and there are no problems there. She personally hasn't noticed any weird lumps or bumps or anything different. She also denies any joint swelling, joint paint, etc. All in all, she denies any symptoms other than the fever.   She has an appointment with an infectious disease doctor in January for possible further assessment.  She is otherwise up to date on her well care. She is here today for follow-up and prolia.    MEDICAL HISTORY: Past Medical History  Diagnosis Date  . IBD (inflammatory bowel disease)   . Hemorrhoids   . Psoriasis   . Thyroid disease   . Ulcerative  colitis [facility]) 5 1 2007    Dr Gala Romney  . Complication of anesthesia     not sure what med was given in 96 with D&C, was very sore all over. Dr Patsey Berthold did anesthesia. Dr Heide Spark did surgery  . Hypertension   . Hypothyroidism   . Breast cancer ([place]) 04/20/14    left breast  . History of breast cancer 05/19/2015  . Pneumonia     has HEMORRHOIDS; UC (ulcerative colitis) ([place]); DIARRHEA; ABDOMINAL CRAMPS; HTN (hypertension), benign; Breast cancer of lower-outer quadrant of left female breast ([place]); Hypothyroidism;  Osteoporosis; History of breast cancer; and Hyperlipidemia on her problem list.     is allergic to flagyl; other; and levaquin.  Beth Hamilton had no medications administered during this visit.  SURGICAL HISTORY: Past Surgical History  Procedure Laterality Date  . Wisdom tooth extraction    . Colonoscopy  10/09/2001    RMR: Internal hemorrhoids and anal papilla; otherwise normal rectum  . Colonoscopy  12/10/2005    incomplete tcs-  colitis  . Flexible sigmoidoscopy  02/26/2006    endoscopically normal-appearing rectum, colitis in sigmoid mucosa  . Tubal ligation    . Dilation and curettage of uterus  1996    Dr Heide Spark  . Colonoscopy N/A 04/18/2014    Dr.Rourk- abnormal rectum & L colon into the mid descending segment. mucosa was erythematious as well as pale w/ some loss of normal vascular apttern. no erosions or ulcers. pt had some smooth peduculated 3-4cm polyps in the sigmoid segment most c/w pseudopolyps. the rest of the colonic mucosa appeared normal. bx= inflammatory polyps  . Partial mastectomy with needle localization and axillary sentinel lymph node bx Left 04/20/2014    Procedure: PARTIAL MASTECTOMY AFTER NEEDLE LOCALIZATION AND AXILLARY SENTINEL LYMPH NODE BX;  Surgeon: Jamesetta So, MD;  Location: [facility];  Service: General;  Laterality: Left;    SOCIAL HISTORY: Social History   Social History  . Marital Status: Married    Spouse Name: N/A  . Number of Children: N/A  . Years of Education: N/A   Occupational History  . Not on file.   Social History Main Topics  . Smoking status: Former Smoker -- 0.50 packs/day for 15 years    Types: Cigarettes    Quit date: 07/26/2005  . Smokeless tobacco: Never Used  . Alcohol Use: Yes     Comment: wine every day  . Drug Use: No  . Sexual Activity: Yes    Birth Control/ Protection: Post-menopausal   Other Topics Concern  . Not on file   Social History Narrative    FAMILY HISTORY: Family History  Problem Relation Age of  Onset  . Colon cancer Neg Hx   . Stroke Maternal Grandfather   . Heart attack Maternal Grandfather   . Hypertension Mother   . Diabetes Paternal Grandfather   . Heart disease Paternal Grandfather     Review of Systems  Constitutional: Negative for chills, weight loss and malaise/fatigue. Positive for fever HENT: Negative for congestion, hearing loss, nosebleeds, sore throat and tinnitus.   Eyes: Negative for blurred vision, double vision, pain and discharge.  Respiratory: Negative for cough, hemoptysis, sputum production, shortness of breath and wheezing.   Cardiovascular: Negative for chest pain, palpitations, claudication, leg swelling and PND.  Gastrointestinal: Negative for heartburn, nausea, vomiting, abdominal pain, diarrhea, constipation, blood in stool and melena.  Genitourinary: Negative for dysuria, urgency, frequency and hematuria.  Musculoskeletal: Negative for myalgias, joint pain and falls.  Skin: Negative  for itching and rash.  Neurological: Negative for dizziness, tingling, tremors, sensory change, speech change, focal weakness, seizures, loss of consciousness, weakness and headaches.  Endo/Heme/Allergies: Does not bruise/bleed easily.  Psychiatric/Behavioral: Negative for depression, suicidal ideas, memory loss and substance abuse. The patient is not nervous/anxious and does not have insomnia.    14 point review of systems was performed and is negative except as detailed under history of present illness and above   PHYSICAL EXAMINATION  ECOG PERFORMANCE STATUS: 0 - Asymptomatic  Filed Vitals:   07/07/15 1100  BP: 140/73  Pulse: 65  Temp: 98.2 F (36.8 C)  Resp: 18    Physical Exam  Constitutional: She is oriented to person, place, and time and well-developed, well-nourished, and in no distress.  HENT:  Head: Normocephalic and atraumatic.  Nose: Nose normal.  Mouth/Throat: Oropharynx is clear and moist. No oropharyngeal exudate.  Eyes: Conjunctivae and EOM  are normal. Pupils are equal, round, and reactive to light. Right eye exhibits no discharge. Left eye exhibits no discharge. No scleral icterus.  Neck: Normal range of motion. Neck supple. No tracheal deviation present. No thyromegaly present.  Cardiovascular: Normal rate, regular rhythm and normal heart sounds.  Exam reveals no gallop and no friction rub.   No murmur heard. Pulmonary/Chest: Effort normal and breath sounds normal. She has no wheezes. She has no rales.  Breast: normal.R breast unremarkable without palpable nodules or skin changes. L breast with well healed surgical incision site, no palpable masses or nodularity no abnormal skin changes.   Candida present in the inframammary folds and between the breasts Abdominal: Soft. Bowel sounds are normal. She exhibits no distension and no mass. There is no tenderness. There is no rebound and no guarding.  Musculoskeletal: Normal range of motion. She exhibits no edema.  Lymphadenopathy:    She has no cervical adenopathy. No palpable axillary adenopathy or supraclavicular adenopathy Neurological: She is alert and oriented to person, place, and time. She has normal reflexes. No cranial nerve deficit. Gait normal. Coordination normal.  Skin: Skin is warm and dry. No rash noted.  Psychiatric: Mood, memory, affect and judgment normal.  Nursing note and vitals reviewed.   LABORATORY DATA: I have reviewed the data as listed.  CBC    Component Value Date/Time   WBC 10.9* 06/28/2015 1648   WBC 11.5* 04/26/2015 0815   RBC 3.91 06/28/2015 1648   RBC 3.67* 04/26/2015 0815   HGB 12.1 06/28/2015 1648   HCT 36.8 06/28/2015 1648   HCT 34.9 04/26/2015 0815   PLT 349 06/28/2015 1648   MCV 94.1 06/28/2015 1648   MCH 30.9 06/28/2015 1648   MCH 30.8 04/26/2015 0815   MCHC 32.9 06/28/2015 1648   MCHC 32.4 04/26/2015 0815   RDW 16.5* 06/28/2015 1648   RDW 12.6 04/26/2015 0815   LYMPHSABS 1.0 04/26/2015 0815   LYMPHSABS 1.1 03/31/2015 0946    MONOABS 0.7 03/31/2015 0946   EOSABS 0.1 03/31/2015 0946   BASOSABS 0.0 04/26/2015 0815   BASOSABS 0.0 03/31/2015 0946   CMP     Component Value Date/Time   NA 140 07/07/2015 1038   K 4.2 07/07/2015 1038   CL 101 07/07/2015 1038   CO2 29 07/07/2015 1038   GLUCOSE 86 07/07/2015 1038   BUN 26* 07/07/2015 1038   CREATININE 0.87 07/07/2015 1038   CREATININE 0.69 11/20/2013 1004   CALCIUM 9.4 07/07/2015 1038   PROT 7.8 07/07/2015 1038   ALBUMIN 4.1 07/07/2015 1038   AST 24 07/07/2015 1038  ALT 24 07/07/2015 1038   ALKPHOS 79 07/07/2015 1038   BILITOT 0.3 07/07/2015 1038   GFRNONAA >60 07/07/2015 1038   GFRAA >60 07/07/2015 1038    ASSESSMENT and THERAPY PLAN:  Stage I ER positive, PR positive, HER-2 negative carcinoma of the left breast. Osteoporosis Macrocytosis Low B12 Low grade fever, markedly elevated CRP 124 mg/L HTN, (did not take medication this am, secondary to CT abdomen today) Leukocytosis patient on prednisone Candida, B inframamary folds  She will currently continue on Arimidex. She has excellent tolerance. The goal is for 5 years of therapy. We will consider doing a breast cancer index to see if she will benefit from longer duration therapy. Her bone density will have to be closely monitored.  She has been taking calcium and vitamin D. She is now on prolia.  We may need to consider monitoring a urine NTX. I have addressed this with her. She is willing to continue on the medication. HOWEVER, I have recommended holding prolia today until her current fever issues are resolved. I do not think the prolia is related but advised her to wait until she has completed her fever evaluation.  She is to continue on folic acid and T70.   She was given a prescription for nystatin powder and diflucan for her candida. Steroids may be contributing to this as well.  We will see her back in 3 months. Her visits will be moved out to 4 month intervals after her next f/u.   Orders  Placed This Encounter  Procedures  . Comprehensive metabolic panel    Standing Status: Standing     Number of Occurrences: 4     Standing Expiration Date: 07/06/2017   All questions were answered. The patient knows to call the clinic with any problems, questions or concerns. We can certainly see the patient much sooner if necessary.   This document serves as a record of services personally performed by Ancil Linsey, MD. It was created on her behalf by Toni Amend, a trained medical scribe. The creation of this record is based on the scribe's personal observations and the provider's statements to them. This document has been checked and approved by the attending provider.  I have reviewed the above documentation for accuracy and completeness, and I agree with the above.  This note was electronically signed.  Kelby Fam. Penland, MD 07/07/2015

## 2015-07-09 NOTE — Assessment & Plan Note (Signed)
59 year old female with history of likely UC, presenting with concern for flare but without typical symptoms. Vague reports of lower abdominal discomfort without rectal bleeding or diarrhea. Has had intermittent fevers consistently now, with appt to see ID upcoming. Elevated CRP in October. Requesting round of prednisone; last was in Oct 2016. Currently taking Lialda. Will provide additional round of Prednisone now, recheck labs to include CBC and CRP. May need to consider revisiting therapy as she has required several rounds of prednisone this year. No recent imaging on file, so will also proceed with a CTE. Further recommendations to follow.

## 2015-07-10 NOTE — Progress Notes (Signed)
cc'ed to pcp °

## 2015-07-18 ENCOUNTER — Telehealth: Payer: Self-pay | Admitting: Internal Medicine

## 2015-07-18 NOTE — Telephone Encounter (Signed)
[phone]  PATIENT CALLED INQUIRING ABOUT CT RESULTS

## 2015-07-18 NOTE — Telephone Encounter (Signed)
Please see result notes.

## 2015-07-18 NOTE — Telephone Encounter (Signed)
Tried to call pt- NA- LMOM 

## 2015-07-18 NOTE — Progress Notes (Signed)
Quick Note:  CRP much improved from prior checks. White count remains marginally elevated, consistent with past measurements. Recurrent wall thickening of mid descending colon and possible involvement of the rectum. How is she doing with any pain, diarrhea, etc? I may want to see in [place] enemas to help with proctitis. She has required several rounds of prednisone, and we may need to visit the idea of step up therapy. I would recommend appt with RMR only in next few weeks. ______

## 2015-07-18 NOTE — Telephone Encounter (Signed)
I spoke with the pt about her results. She did say it was ok to leave results on her voicemail.

## 2015-07-18 NOTE — Telephone Encounter (Signed)
Routing to AS for results.

## 2015-07-19 ENCOUNTER — Encounter: Payer: Self-pay | Admitting: Internal Medicine

## 2015-07-19 NOTE — Progress Notes (Signed)
APPT MADE AND LETTER SENT  °

## 2015-07-19 NOTE — Progress Notes (Signed)
Quick Note:  It appears that the diseased part of the colon on this imaging is same as on colonoscopy. Needs to make sure she has a repeat CXR through Dr. Lance Sell office, which has already been ordered. Keep appt with Dr. Gala Romney in mid January. ______

## 2015-07-20 ENCOUNTER — Telehealth: Payer: Self-pay

## 2015-07-20 NOTE — Telephone Encounter (Signed)
Spoke with the pt about her ct results- pt has been on lialda since 2007, she is concerned that she is not responding to the medication anymore. She has one week left on her prednisone. She wants to know if she needs to change medications. Also she has an appt at [facility] on 08/01/15. She doesn't feel like she needs to be seen there and wants to know what RMR thinks about it. She said she will go if he really wants her to but she would prefer to just come here and see RMR.  She is aware that RMR is out of town and will be back next week. I informed her that the office was closed on Monday. She said that it was fine to call her back next week.

## 2015-07-25 NOTE — Telephone Encounter (Signed)
Tried to call pt- NA- LMOM with recommendations.

## 2015-07-25 NOTE — Telephone Encounter (Signed)
Keep appt w [facility]. No more prednisone until fever / potential underlying infection ruled out.  We can see her after [facility] appt.

## 2015-07-31 ENCOUNTER — Ambulatory Visit: Payer: BLUE CROSS/BLUE SHIELD | Admitting: Internal Medicine

## 2015-08-01 ENCOUNTER — Ambulatory Visit: Payer: BLUE CROSS/BLUE SHIELD | Admitting: Internal Medicine

## 2015-08-08 ENCOUNTER — Ambulatory Visit: Payer: BLUE CROSS/BLUE SHIELD | Admitting: Internal Medicine

## 2015-08-21 ENCOUNTER — Ambulatory Visit (INDEPENDENT_AMBULATORY_CARE_PROVIDER_SITE_OTHER): Payer: Managed Care, Other (non HMO) | Admitting: Infectious Diseases

## 2015-08-21 ENCOUNTER — Encounter: Payer: Self-pay | Admitting: Infectious Diseases

## 2015-08-21 VITALS — BP 156/95 | HR 99 | Temp 98.3°F | Ht 63.0 in | Wt 186.0 lb

## 2015-08-21 DIAGNOSIS — R509 Fever, unspecified: Secondary | ICD-10-CM | POA: Diagnosis not present

## 2015-08-21 LAB — CBC WITH DIFFERENTIAL/PLATELET
Basophils Absolute: 0 10*3/uL (ref 0.0–0.1)
Basophils Relative: 0 % (ref 0–1)
EOS PCT: 1 % (ref 0–5)
Eosinophils Absolute: 0.1 10*3/uL (ref 0.0–0.7)
HEMATOCRIT: 43.6 % (ref 36.0–46.0)
HEMOGLOBIN: 14.7 g/dL (ref 12.0–15.0)
LYMPHS PCT: 22 % (ref 12–46)
Lymphs Abs: 1.4 10*3/uL (ref 0.7–4.0)
MCH: 33.1 pg (ref 26.0–34.0)
MCHC: 33.7 g/dL (ref 30.0–36.0)
MCV: 98.2 fL (ref 78.0–100.0)
MONO ABS: 0.5 10*3/uL (ref 0.1–1.0)
MONOS PCT: 7 % (ref 3–12)
MPV: 10.4 fL (ref 8.6–12.4)
NEUTROS ABS: 4.6 10*3/uL (ref 1.7–7.7)
Neutrophils Relative %: 70 % (ref 43–77)
Platelets: 252 10*3/uL (ref 150–400)
RBC: 4.44 MIL/uL (ref 3.87–5.11)
RDW: 14.5 % (ref 11.5–15.5)
WBC: 6.5 10*3/uL (ref 4.0–10.5)

## 2015-08-21 NOTE — Progress Notes (Signed)
   Subjective:    Patient ID: Beth Hamilton, female    DOB: [birth year], 58 y.o.   MRN: [id number]  HPI 60 yo F with hx of L breast infiltrating ductal CA (ER/PR+, HER-2/neu-), s/p lumpectomy 05-2014, s/p XRT, on Arimidex.  Also hx of UC.  She was seen by her MD on 06-2015 with complaints of fever since June. She was eval by her endocrinologist for her thyroid (reportedly nl).  She was given a trial of prednisone and her fever resolved. She state that this is how she was dx.  She had CT scan of GI tract 06-2015:  1. Recurrent wall thickening of the mid descending colon suspicious for recurrent colitis. Possible involvement of the distal rectum as well. No evidence bowel obstruction, fistula or perforation. 2. Moderate size hiatal hernia. 3. Mild aortoiliac atherosclerosis. 4. Central left lower lobe pulmonary opacity, improved from radiographs 04/11/2015. This may reflect radiation pneumonitis given the patient's history. This is incompletely evaluated by this examination. Chest radiographic follow up recommended.  Has nl BM, 1-2x/day. Been off prednisone since 07-26-15.   PMHx, Soc, FHx  Works in Press photographer for a [employer]   Review of Systems  Constitutional: Negative for fever, chills, appetite change and unexpected weight change.  Respiratory: Negative for cough and shortness of breath.   Gastrointestinal: Negative for diarrhea, constipation and blood in stool.  Genitourinary: Negative for difficulty urinating and menstrual problem.  Neurological: Negative for headaches.  Hematological: Negative for adenopathy.   No hx of TB exposure     Objective:   Physical Exam  Constitutional: She appears well-developed and well-nourished.  HENT:  Mouth/Throat: No oropharyngeal exudate.  Eyes: EOM are normal. Pupils are equal, round, and reactive to light.  Neck: Neck supple.  Cardiovascular: Normal rate, regular rhythm and normal heart sounds.   Pulmonary/Chest: Effort normal and  breath sounds normal.  Abdominal: Soft. Bowel sounds are normal. There is no tenderness. There is no rebound.  Musculoskeletal: She exhibits no edema.  Lymphadenopathy:    She has no cervical adenopathy.      Assessment & Plan:

## 2015-08-21 NOTE — Assessment & Plan Note (Signed)
She is doing well.  Will check her BCx (she does not have a port) Will check her TB serologies Will check her fungal serologies- if any abn, suggest BAL She needs to have f/u CXR done in 2-3 months  She will f/u with PCP Can call back for test results.  She refuses flu shot

## 2015-08-23 LAB — QUANTIFERON TB GOLD ASSAY (BLOOD)
Interferon Gamma Release Assay: NEGATIVE
Mitogen value: 9.16 IU/mL
QUANTIFERON TB AG MINUS NIL: 0 [IU]/mL
Quantiferon Nil Value: 0.02 IU/mL
TB Ag value: 0.02 IU/mL

## 2015-08-27 LAB — CULTURE, BLOOD (SINGLE)
ORGANISM ID, BACTERIA: NO GROWTH
ORGANISM ID, BACTERIA: NO GROWTH

## 2015-08-30 ENCOUNTER — Telehealth: Payer: Self-pay | Admitting: Infectious Diseases

## 2015-08-31 NOTE — Telephone Encounter (Signed)
error 

## 2015-09-02 LAB — FUNGAL ANTIBODIES PANEL, ID-BLOOD
ASPERGILLUS FLAVUS ANTIBODIES: NEGATIVE
ASPERGILLUS FUMIGATUS: NEGATIVE
ASPERGILLUS NIGER ANTIBODIES: NEGATIVE
BLASTOMYCES ABS, QN, DID: NEGATIVE
Coccidioides Antibody ID: NEGATIVE
Histoplasma Antibody, ID: NEGATIVE

## 2015-09-06 ENCOUNTER — Telehealth: Payer: Self-pay | Admitting: *Deleted

## 2015-09-06 NOTE — Telephone Encounter (Signed)
Patient called and advised she wanted her lab results. Advised her all appear normal/negative. She asked what her next step is at this point. Advised will have to ask the doctor and give her a call back. She also asked if we could fax copy of results to her GI Dr Gala Romney Fax [phone] and to Dr Wolfgang Phoenix. Advised her Dr Wolfgang Phoenix is on the same system as we are and can see but I will fax to Dr Gala Romney today and will call her back once Dr Johnnye Sima responds.

## 2015-09-08 ENCOUNTER — Telehealth: Payer: Self-pay | Admitting: Infectious Diseases

## 2015-09-08 ENCOUNTER — Telehealth: Payer: Self-pay | Admitting: Internal Medicine

## 2015-09-08 NOTE — Telephone Encounter (Signed)
Per RMR- he is ok with the pt seeing LSL in the office. Records from ID are in EPIC.

## 2015-09-08 NOTE — Telephone Encounter (Signed)
Thank you. I will let her know.

## 2015-09-08 NOTE — Telephone Encounter (Signed)
Pt called to make OV to see RMR on 3/16 before she sees Dr Whitney Muse the same day. I told her that was on a Thursday and RMR isn't in the office on Thursday. She does not want to see AS, but was asking if RMR wanted to see her or was he ok with her seeing LSL. She did not want to make OV with LSL until she knew what RMR recommended. She had another call and said she would call me back in an hour. Please advise if her records are here that she's talking about and if she can see LSL on 3/16 if that's ok with RMR.

## 2015-09-08 NOTE — Telephone Encounter (Signed)
Pt prev advised to keep temp log F/u with her PCP

## 2015-10-05 ENCOUNTER — Ambulatory Visit (HOSPITAL_COMMUNITY): Payer: BLUE CROSS/BLUE SHIELD | Admitting: Hematology & Oncology

## 2015-10-05 ENCOUNTER — Ambulatory Visit (INDEPENDENT_AMBULATORY_CARE_PROVIDER_SITE_OTHER): Payer: Managed Care, Other (non HMO) | Admitting: Gastroenterology

## 2015-10-05 ENCOUNTER — Encounter: Payer: Self-pay | Admitting: Gastroenterology

## 2015-10-05 ENCOUNTER — Encounter (HOSPITAL_COMMUNITY): Payer: Self-pay | Admitting: Hematology & Oncology

## 2015-10-05 ENCOUNTER — Encounter (HOSPITAL_COMMUNITY): Payer: BLUE CROSS/BLUE SHIELD | Attending: Hematology & Oncology | Admitting: Hematology & Oncology

## 2015-10-05 VITALS — BP 157/85 | HR 96 | Temp 98.2°F | Resp 18 | Wt 186.7 lb

## 2015-10-05 VITALS — BP 156/94 | HR 110 | Temp 97.6°F | Ht 64.0 in | Wt 186.2 lb

## 2015-10-05 DIAGNOSIS — M810 Age-related osteoporosis without current pathological fracture: Secondary | ICD-10-CM

## 2015-10-05 DIAGNOSIS — D559 Anemia due to enzyme disorder, unspecified: Secondary | ICD-10-CM

## 2015-10-05 DIAGNOSIS — K5190 Ulcerative colitis, unspecified, without complications: Secondary | ICD-10-CM | POA: Diagnosis not present

## 2015-10-05 DIAGNOSIS — Z170 Estrogen receptor positive status [ER+]: Secondary | ICD-10-CM

## 2015-10-05 DIAGNOSIS — D72829 Elevated white blood cell count, unspecified: Secondary | ICD-10-CM

## 2015-10-05 DIAGNOSIS — D7589 Other specified diseases of blood and blood-forming organs: Secondary | ICD-10-CM | POA: Diagnosis not present

## 2015-10-05 DIAGNOSIS — I10 Essential (primary) hypertension: Secondary | ICD-10-CM

## 2015-10-05 DIAGNOSIS — C50512 Malignant neoplasm of lower-outer quadrant of left female breast: Secondary | ICD-10-CM | POA: Insufficient documentation

## 2015-10-05 DIAGNOSIS — E559 Vitamin D deficiency, unspecified: Secondary | ICD-10-CM

## 2015-10-05 DIAGNOSIS — R509 Fever, unspecified: Secondary | ICD-10-CM

## 2015-10-05 DIAGNOSIS — B379 Candidiasis, unspecified: Secondary | ICD-10-CM

## 2015-10-05 MED ORDER — FLUTICASONE PROPIONATE 0.05 % EX CREA: TOPICAL_CREAM | Freq: Two times a day (BID) | CUTANEOUS | Status: DC

## 2015-10-05 NOTE — Progress Notes (Signed)
Beth Lange, MD [place] [address]    DIAGNOSIS: Breast cancer of lower-outer quadrant of left female breast   Staging form: Breast, AJCC 7th Edition     Clinical: No stage assigned - Unsigned  Stage I (pT1b pNOMx) infiltrating ductal carcinoma of the left breast, status post lumpectomy and axillary sentinel node sampling, ER/PR positive, HER-2/neu not overexpressed, Oncotype DX score of 7, adjuvant XRT. On Arimidex.  Biopsy was performed on 04/05/2014 at which time a combination of infiltrating ductal carcinoma and noninvasive ductal neoplasia were found, ER 100%, PR 100%  Ulcerative colitis, controlled.  DEXA 05/2014 with osteoporosis COLONOSCOPY 04/18/2014  SUMMARY OF ONCOLOGIC HISTORY:   Breast cancer of lower-outer quadrant of left female breast ([place])   05/24/2014 Initial Diagnosis Breast cancer of lower-outer quadrant of left female breast   06/22/2014 - 08/05/2014 Radiation Therapy Left breast 50 Gy at 2 Gy per fraction x 25 fractions with left breast boost 10 Gy at 2 Gy per fraction x 5 fractions by Beth Hamilton.    CURRENT THERAPY: Arimidex  INTERVAL HISTORY: Beth Hamilton 60 y.o. female returns for follow-up of a stage I Left breast cancer. She is currently on Arimidex. DEXA scan performed in November 2015 showed osteoporosis.   Beth Hamilton was here alone today. She has an appointment with Beth Hamilton at [facility]. She had a thorough evaluation for FUO with infectious disease. She notes that she is convinced it is related to her underlying inflammatory bowel disease. She checks her temperature for a fever every day. She has not had one recently. She is willing to restart Prolia.  She says that she has been having some yeast under her breasts. She uses Fluticasone cream and this clears it up. She asked if she could have a prescription for this cream. She currently denies any rash.  She denies chest pain and blood in her stool. She said  that her breathing and bowels are okay.   She does not need any refills.  MEDICAL HISTORY: Past Medical History  Diagnosis Date  . IBD (inflammatory bowel disease)     UC  . Hemorrhoids   . Psoriasis   . Thyroid disease   . Ulcerative colitis ([place]) 5 1 2007    Beth Gala Hamilton  . Complication of anesthesia     not sure what med was given in 96 with D&C, was very sore all over. Beth Hamilton did anesthesia. Beth Hamilton did surgery  . Hypertension   . Hypothyroidism   . Breast cancer ([place]) 04/20/14    left breast  . History of breast cancer 05/19/2015  . Pneumonia     has HEMORRHOIDS; UC (ulcerative colitis) ([place]); DIARRHEA; ABDOMINAL CRAMPS; HTN (hypertension), benign; Breast cancer of lower-outer quadrant of left female breast ([place]); Hypothyroidism; Osteoporosis; History of breast cancer; Hyperlipidemia; and FUO (fever of unknown origin) on her problem list.     is allergic to flagyl; other; and levaquin.  [name] had no medications administered during this visit.  SURGICAL HISTORY: Past Surgical History  Procedure Laterality Date  . Wisdom tooth extraction    . Colonoscopy  10/09/2001    RMR: Internal hemorrhoids and anal papilla; otherwise normal rectum  . Colonoscopy  12/10/2005    incomplete tcs-  colitis  . Flexible sigmoidoscopy  02/26/2006    endoscopically normal-appearing rectum, colitis in sigmoid mucosa  . Tubal ligation    . Dilation and curettage of uterus  1996  Beth Hamilton  . Colonoscopy N/A 04/18/2014    Beth Hamilton- abnormal rectum & L colon into the mid descending segment. mucosa was erythematious as well as pale w/ some loss of normal vascular apttern. no erosions or ulcers. pt had some smooth peduculated 3-4cm polyps in the sigmoid segment most c/w pseudopolyps. the rest of the colonic mucosa appeared normal. bx= inflammatory polyps  . Partial mastectomy with needle localization and axillary sentinel lymph node bx Left 04/20/2014    Procedure: PARTIAL MASTECTOMY  AFTER NEEDLE LOCALIZATION AND AXILLARY SENTINEL LYMPH NODE BX;  Surgeon: Beth So, MD;  Location: [facility];  Service: General;  Laterality: Left;    SOCIAL HISTORY: Social History   Social History  . Marital Status: Married    Spouse Name: N/A  . Number of Children: N/A  . Years of Education: N/A   Occupational History  . Not on file.   Social History Main Topics  . Smoking status: Former Smoker -- 0.50 packs/day for 15 years    Types: Cigarettes    Quit date: 07/26/2005  . Smokeless tobacco: Never Used  . Alcohol Use: Yes     Comment: wine every day  . Drug Use: No  . Sexual Activity: Yes    Birth Control/ Protection: Post-menopausal   Other Topics Concern  . Not on file   Social History Narrative    FAMILY HISTORY: Family History  Problem Relation Age of Onset  . Colon cancer Neg Hx   . Stroke Maternal Grandfather   . Heart attack Maternal Grandfather   . Hypertension Mother   . Diabetes Paternal Grandfather   . Heart disease Paternal Grandfather     Review of Systems  Constitutional: Negative for chills, fever, weight loss and malaise/fatigue.  HENT: Negative for congestion, hearing loss, nosebleeds, sore throat and tinnitus.   Eyes: Negative for blurred vision, double vision, pain and discharge.  Respiratory: Negative for cough, hemoptysis, sputum production, shortness of breath and wheezing.   Cardiovascular: Negative for chest pain, palpitations, claudication, leg swelling and PND.  Gastrointestinal: Negative for heartburn, nausea, vomiting, abdominal pain, diarrhea, constipation, blood in stool and melena.  Genitourinary: Negative for dysuria, urgency, frequency and hematuria.  Musculoskeletal: Negative for myalgias, joint pain and falls.  Skin: Negative for rash.  Neurological: Negative for dizziness, tingling, tremors, sensory change, speech change, focal weakness, seizures, loss of consciousness, weakness and headaches.  Endo/Heme/Allergies: Does  not bruise/bleed easily.  Psychiatric/Behavioral: Negative for depression, suicidal ideas, memory loss and substance abuse. The patient is not nervous/anxious and does not have insomnia.    14 point review of systems was performed and is negative except as detailed under history of present illness and above   PHYSICAL EXAMINATION  ECOG PERFORMANCE STATUS: 0 - Asymptomatic  Filed Vitals:   10/05/15 0820  BP: 157/85  Pulse: 96  Temp: 98.2 F (36.8 C)  Resp: 18    Physical Exam  Constitutional: She is oriented to person, place, and time and well-developed, well-nourished, and in no distress.  HENT:  Head: Normocephalic and atraumatic.  Nose: Nose normal.  Mouth/Throat: Oropharynx is clear and moist. No oropharyngeal exudate.  Eyes: Conjunctivae and EOM are normal. Pupils are equal, round, and reactive to light. Right eye exhibits no discharge. Left eye exhibits no discharge. No scleral icterus.  Neck: Normal range of motion. Neck supple. No tracheal deviation present. No thyromegaly present.  Cardiovascular: Normal rate, regular rhythm and normal heart sounds.  Exam reveals no gallop and no friction rub.  No murmur heard. Pulmonary/Chest: Effort normal and breath sounds normal. She has no wheezes. She has no rales.  Abdominal: Soft. Bowel sounds are normal. She exhibits no distension and no mass. There is no tenderness. There is no rebound and no guarding.  Musculoskeletal: Normal range of motion. She exhibits no edema.  Lymphadenopathy:    She has no cervical adenopathy. No palpable axillary adenopathy or supraclavicular adenopathy Neurological: She is alert and oriented to person, place, and time. She has normal reflexes. No cranial nerve deficit. Gait normal. Coordination normal.  Skin: Skin is warm and dry. No rash noted.  Psychiatric: Mood, memory, affect and judgment normal.  Nursing note and vitals reviewed.  LABORATORY DATA: I have reviewed the data as listed.  CBC      Component Value Date/Time   WBC 6.5 08/21/2015 0959   WBC 11.5* 04/26/2015 0815   RBC 4.44 08/21/2015 0959   RBC 3.67* 04/26/2015 0815   HGB 14.7 08/21/2015 0959   HCT 43.6 08/21/2015 0959   HCT 34.9 04/26/2015 0815   PLT 252 08/21/2015 0959   PLT 520* 04/26/2015 0815   MCV 98.2 08/21/2015 0959   MCV 95 04/26/2015 0815   MCH 33.1 08/21/2015 0959   MCH 30.8 04/26/2015 0815   MCHC 33.7 08/21/2015 0959   MCHC 32.4 04/26/2015 0815   RDW 14.5 08/21/2015 0959   RDW 12.6 04/26/2015 0815   LYMPHSABS 1.4 08/21/2015 0959   LYMPHSABS 1.0 04/26/2015 0815   MONOABS 0.5 08/21/2015 0959   EOSABS 0.1 08/21/2015 0959   EOSABS 0.2 04/26/2015 0815   BASOSABS 0.0 08/21/2015 0959   BASOSABS 0.0 04/26/2015 0815   CMP     Component Value Date/Time   NA 140 07/07/2015 1038   K 4.2 07/07/2015 1038   CL 101 07/07/2015 1038   CO2 29 07/07/2015 1038   GLUCOSE 86 07/07/2015 1038   BUN 26* 07/07/2015 1038   CREATININE 0.87 07/07/2015 1038   CREATININE 0.69 11/20/2013 1004   CALCIUM 9.4 07/07/2015 1038   PROT 7.8 07/07/2015 1038   ALBUMIN 4.1 07/07/2015 1038   AST 24 07/07/2015 1038   ALT 24 07/07/2015 1038   ALKPHOS 79 07/07/2015 1038   BILITOT 0.3 07/07/2015 1038   GFRNONAA >60 07/07/2015 1038   GFRAA >60 07/07/2015 1038   ASSESSMENT and THERAPY PLAN:  Stage I ER positive, PR positive, HER-2 negative carcinoma of the left breast. Osteoporosis Macrocytosis Low B12 Low grade fever, markedly elevated CRP 124 mg/L HTN, (did not take medication this am, secondary to CT abdomen today) Leukocytosis patient on prednisone Candida, B inframamary folds  She will currently continue on Arimidex. She has excellent tolerance. The goal is for 5 years of therapy. We will consider doing a breast cancer index to see if she will benefit from longer duration therapy. Her bone density will have to be closely monitored.  She has been taking calcium and vitamin D. She is now on prolia.  We may need to  consider monitoring a urine NTX. I have addressed this with her. She is willing to continue on the medication. She however does not want to restart until her next visit.   She is to continue on folic acid and W26.   I had given her a prescription at her last visit for diflucan and nystatin as she clearly had candida under the inframammary folds of the breasts, I advised her that fluticasone does not clear candida; But Low-potency topical corticosteroids may be used in conjunction with antifungal  therapy to treat associated pruritus, pain, and burning. I have cautioned her in the use of her fluticasone cream.  Currently she is rash free. I advised her that if it returns to notify us.  She will return in 4 months for a follow up.   All questions were answered. The patient knows to call the clinic with any problems, questions or concerns. We can certainly see the patient much sooner if necessary.   This document serves as a record of services personally performed by Ancil Linsey, MD. It was created on her behalf by Kandace Blitz, a trained medical scribe. The creation of this record is based on the scribe's personal observations and the provider's statements to them. This document has been checked and approved by the attending provider.  I have reviewed the above documentation for accuracy and completeness, and I agree with the above.  This note was electronically signed.  Kelby Fam. Starlin Steib, MD 10/05/2015

## 2015-10-05 NOTE — Progress Notes (Signed)
Primary Care Physician: [name], MD  Primary Gastroenterologist:  Garfield Cornea, MD   Chief Complaint  Patient presents with  . Follow-up    HPI: Beth Hamilton is a 60 y.o. female here for follow up of indeterminate colitis (likely UC). Last seen in office 06/2015. She has a history of persistent/recurrent low-grade fever of unclear etiology that started late last summer. Treated for UTI and PNA prior to end of 03/2015. She is up to date on her oncology f/u for h/o left breast infiltrating ductal CA. Has been evaluated by her endocrinologist given h/o multinodular thyroid. Seen by ID 07/2015. She had blood cultures, TB serologies, fungal serologies which were all negative. During this time she was had CRP elevated as high as 124.8, most recently down to 9.5. Patient believes her fever was due to her colitis flaring. States in the past she had similar presentation without significant diarrhea or abd pain and just fever that would go away with prednisone.   At this time she has not had any recent fevers, none since 06/2015.   CTE abd/pelvis 07/07/15: Recurrent wall thickening of the mid descending colon suspicious for recurrent colitis, possible involvement of the distal rectum. Central left lower lobe pulmonary opacity improved from x-ray in September, question radiation pneumonitis, incompletely evaluated on current study recommend chest x-ray follow-up. Her last colonoscopy was in September 2015 and she had abnormal rectum and left colon into the mid descending segment on that study with erythematous as well as pale mucosa with loss of normal vascular pattern. No ulcers or erosions. Mildly active colitis in the sigmoid colon biopsy.  Patient states clinically she is doing well. She tells me she increased her Lialda to 5 daily on her own and feels like this is helping her colitis tremendously. Denies abdominal pain. Bowel movements are regular. 1-2 BMs daily. Appetite is good. No blood  in the stool or melena. No heartburn.    Current Outpatient Prescriptions  Medication Sig Dispense Refill  . acyclovir (ZOVIRAX) 400 MG tablet     . anastrozole (ARIMIDEX) 1 MG tablet Take 1 tablet (1 mg total) by mouth daily. 90 tablet 3  . ARMOUR THYROID 30 MG tablet Take 30 mg by mouth daily.     . Calcium Citrate (CITRACAL PO) Take 2 capsules by mouth daily. 1200 mg calcium and Vitamin D 1000 IU    . Cyanocobalamin (VITAMIN B 12 PO) Take by mouth. Sublingual-once daily    . fluticasone (CUTIVATE) 0.05 % cream Apply topically 2 (two) times daily. 60 g 2  . FOLIC ACID PO Take by mouth daily.    Marland Kitchen LIALDA 1.2 G EC tablet TAKE 4 TABLETS BY MOUTH DAILY. 120 tablet 11  . lisinopril (PRINIVIL,ZESTRIL) 10 MG tablet Take 1 tablet (10 mg total) by mouth daily. 30 tablet 6  . nystatin (MYCOSTATIN/NYSTOP) 100000 UNIT/GM POWD Apply topically under and between breasts twice daily until gone 60 g 1   No current facility-administered medications for this visit.    Allergies as of 10/05/2015 - Review Complete 10/05/2015  Allergen Reaction Noted  . Flagyl [metronidazole]  10/29/2013  . Other Other (See Comments) 03/22/2014  . Levaquin [levofloxacin in d5w]  07/23/2013    ROS:  General: Negative for anorexia, weight loss, fever, chills, fatigue, weakness. ENT: Negative for hoarseness, difficulty swallowing , nasal congestion. CV: Negative for chest pain, angina, palpitations, dyspnea on exertion, peripheral edema.  Respiratory: Negative for dyspnea at rest, dyspnea on exertion, cough,  sputum, wheezing.  GI: See history of present illness. GU:  Negative for dysuria, hematuria, urinary incontinence, urinary frequency, nocturnal urination.  Endo: Negative for unusual weight change.    Physical Examination:   BP 156/94 mmHg  Pulse 110  Temp(Src) 97.6 F (36.4 C)  Ht 5' 4"  (1.626 m)  Wt 186 lb 3.2 oz (84.46 kg)  BMI 31.95 kg/m2  General: Well-nourished, well-developed in no acute distress.    Eyes: No icterus. Mouth: Oropharyngeal mucosa moist and pink , no lesions erythema or exudate. Lungs: Clear to auscultation bilaterally.  Heart: Regular rate and rhythm, no murmurs rubs or gallops.  Abdomen: Bowel sounds are normal, nontender, nondistended, no hepatosplenomegaly or masses, no abdominal bruits or hernia , no rebound or guarding.   Extremities: No lower extremity edema. No clubbing or deformities. Neuro: Alert and oriented x 4   Skin: Warm and dry, no jaundice.   Psych: Alert and cooperative, normal mood and affect.  Labs:  Lab Results  Component Value Date   WBC 6.5 08/21/2015   HGB 14.7 08/21/2015   HCT 43.6 08/21/2015   MCV 98.2 08/21/2015   PLT 252 08/21/2015   Lab Results  Component Value Date   CREATININE 0.87 07/07/2015   BUN 26* 07/07/2015   NA 140 07/07/2015   K 4.2 07/07/2015   CL 101 07/07/2015   CO2 29 07/07/2015   Lab Results  Component Value Date   ALT 24 07/07/2015   AST 24 07/07/2015   ALKPHOS 79 07/07/2015   BILITOT 0.3 07/07/2015    Imaging Studies: No results found.

## 2015-10-05 NOTE — Patient Instructions (Addendum)
[facility] at [facility] Discharge Instructions  RECOMMENDATIONS MADE BY THE CONSULTANT AND ANY TEST RESULTS WILL BE SENT TO YOUR REFERRING PHYSICIAN.    Exam and discussion by Dr Whitney Muse today Prolia with next appointment Vaseline with shea butter you can use on your dry skin  Fluticasone cream sent to your pharmacy [facility]) If you keep applying this cream and the rash doesn't get better then its just yeast left, please call us we can get you something for yeast  Return to see the doctor in 4 months with labs  Please call the clinic if you have any questions or concerns     Thank you for choosing [facility] at [facility] to provide your oncology and hematology care.  To afford each patient quality time with our provider, please arrive at least 15 minutes before your scheduled appointment time.   Beginning January 23rd 2017 lab work for the [facility] will be done in the  Main lab at [facility] on 1st floor. If you have a lab appointment with the [facility] please come in thru the  Main Entrance and check in at the main information desk  You need to re-schedule your appointment should you arrive 10 or more minutes late.  We strive to give you quality time with our providers, and arriving late affects you and other patients whose appointments are after yours.  Also, if you no show three or more times for appointments you may be dismissed from the clinic at the providers discretion.     Again, thank you for choosing [facility].  Our hope is that these requests will decrease the amount of time that you wait before being seen by our physicians.       _____________________________________________________________  Should you have questions after your visit to [facility], please contact our office at [phone] between the hours of 8:30 a.m. and 4:30 p.m.  Voicemails left after 4:30 p.m. will not be  returned until the following business day.  For prescription refill requests, have your pharmacy contact our office.         Resources For Cancer Patients and their Caregivers ? American Cancer Society: Can assist with transportation, wigs, general needs, runs Look Good Feel Better.        231 571 1978 ? Cancer Care: Provides financial assistance, online support groups, medication/co-pay assistance.  1-800-813-HOPE 475-834-0637) ? [facility] Assists [place] cancer patients and their families through emotional , educational and financial support.  413-548-8709 ? [place] DSS Where to apply for food stamps, Medicaid and utility assistance. 507-380-3198 ? [facility]: Transportation to medical appointments. 639-750-2309 ? Social Security Administration: May apply for disability if have a Stage IV cancer. 825-414-9058 303-503-6769 ? [employer], Disability and Transit Services: Assists with nutrition, care and transit needs. [phone]

## 2015-10-05 NOTE — Patient Instructions (Signed)
I will call you with further instructions once discussed with Dr. Gala Romney first of next week.

## 2015-10-13 NOTE — Assessment & Plan Note (Signed)
60 year old lady with history of likely UC, recurrent fevers last year as outlined above who presents for follow-up. She has been doing well the last 3 months. She's had no recurrent fevers. No GI symptoms. She believes that her fever was related to her colitis. She had an extensive evaluation by her endocrinologist, her oncologist, her primary care physician, and infectious disease. During this timeframe she did have a significantly elevated CRP which has also improved. Fever resolves with prednisone.  Patiently currently feels great. Her symptoms have completely resolved. She notes however that on her own she increased her Lialda to 5 daily as above the FDA approved dose. This is the only mesalamine preparation that she has been on in the past. I will discuss further with Dr. Gala Romney regarding management of her colitis moving 4. CTE and her last colonoscopy indicated active left-sided disease although appear to be mild. We may need to add a topical mesalamine preparation to cover this part of the colon were adequately or consider switching oral therapy.

## 2015-10-16 MED ORDER — MESALAMINE 400 MG PO CPDR: 800.0000 mg | DELAYED_RELEASE_CAPSULE | Freq: Three times a day (TID) | ORAL | Status: DC

## 2015-10-16 NOTE — Addendum Note (Signed)
Addended by: Mahala Menghini on: 10/16/2015 12:56 PM   Modules accepted: Orders

## 2015-10-16 NOTE — Progress Notes (Addendum)
Discussed with Dr. Gala Romney. He advises against taking more than 4 Lialda per day (she recently increased to 5 daily on her own). Consider switching to a different mesalamine if patient agreeable. Delzicol 482m take two TID would be a possible choice.   I spoke with patient. Her only concern is the cost. Right now her LDoristine Johnsis only 10 bucks with a loyalty card. She just got her RX filled. We will check into it in the meantime she will stay on the Lialda and monitor for fever/recurrent symptoms.  JAlmyra Free I am going to send in RX for Delzicol to find out what her out of pocket would be. Can you follow up on this?

## 2015-10-16 NOTE — Progress Notes (Signed)
CC'ED TO PCP 

## 2015-10-17 NOTE — Progress Notes (Signed)
Insurance required a PA for this medication. PA has been done and faxed to the company.

## 2015-10-19 MED ORDER — BALSALAZIDE DISODIUM 750 MG PO CAPS: 2250.0000 mg | ORAL_CAPSULE | Freq: Three times a day (TID) | ORAL | Status: DC

## 2015-10-19 NOTE — Progress Notes (Signed)
RMR had suggested Colazal originally so I will send that one in so we can check price.

## 2015-10-19 NOTE — Addendum Note (Signed)
Addended by: Mahala Menghini on: 10/19/2015 01:33 PM   Modules accepted: Orders, Medications

## 2015-10-19 NOTE — Progress Notes (Signed)
delzicol was denied. Pt has not tried and failed apriso, balsalazide, pentasa and sulfasaazine. Pt has to try and fail all of them.

## 2015-10-26 NOTE — Progress Notes (Signed)
What is status on Colazal?

## 2015-10-30 NOTE — Progress Notes (Signed)
It is covered. The pharmacy said the copay will be $24.38. I have called the pt, she said she still had some lialda and she wanted to finish it before she tried any thing else. But she will pick it up.

## 2015-12-01 ENCOUNTER — Ambulatory Visit: Payer: BLUE CROSS/BLUE SHIELD | Admitting: Family Medicine

## 2015-12-06 ENCOUNTER — Ambulatory Visit (INDEPENDENT_AMBULATORY_CARE_PROVIDER_SITE_OTHER): Payer: Managed Care, Other (non HMO) | Admitting: Family Medicine

## 2015-12-06 ENCOUNTER — Encounter: Payer: Self-pay | Admitting: Family Medicine

## 2015-12-06 VITALS — BP 122/82 | Ht 64.0 in | Wt 183.0 lb

## 2015-12-06 DIAGNOSIS — E785 Hyperlipidemia, unspecified: Secondary | ICD-10-CM | POA: Diagnosis not present

## 2015-12-06 DIAGNOSIS — I10 Essential (primary) hypertension: Secondary | ICD-10-CM | POA: Diagnosis not present

## 2015-12-06 MED ORDER — LISINOPRIL 10 MG PO TABS: 10.0000 mg | ORAL_TABLET | Freq: Every day | ORAL | Status: DC

## 2015-12-06 NOTE — Progress Notes (Signed)
   Subjective:    Patient ID: Beth Hamilton, female    DOB: January 10, 1956, 60 y.o.   MRN: [id number]  Hypertension This is a chronic problem. The current episode started more than 1 year ago. Pertinent negatives include no chest pain. Risk factors for coronary artery disease include post-menopausal state. Treatments tried: lisinopril. There are no compliance problems.     Patient takes her medicine on a regular basis she does try to stay healthy with your eating does try to stay physically active. Is trying to lose weight.ay  Review of Systems  Constitutional: Negative for activity change, appetite change and fatigue.  HENT: Negative for congestion.   Respiratory: Negative for cough.   Cardiovascular: Negative for chest pain.  Gastrointestinal: Negative for abdominal pain.  Endocrine: Negative for polydipsia and polyphagia.  Neurological: Negative for weakness.  Psychiatric/Behavioral: Negative for confusion.       Objective:   Physical Exam  Constitutional: She appears well-nourished. No distress.  Cardiovascular: Normal rate, regular rhythm and normal heart sounds.   No murmur heard. Pulmonary/Chest: Effort normal and breath sounds normal. No respiratory distress.  Musculoskeletal: She exhibits no edema.  Lymphadenopathy:    She has no cervical adenopathy.  Neurological: She is alert. She exhibits normal muscle tone.  Psychiatric: Her behavior is normal.  Vitals reviewed.         Assessment & Plan:  HTN- Patient was seen today as part of a visit regarding hypertension. The importance of healthy diet and regular physical activity was discussed. The importance of compliance with medications discussed. Ideal goal is to keep blood pressure low elevated levels certainly below 383/81 when possible. The patient was counseled that keeping blood pressure under control lessen his risk of heart attack, stroke, kidney failure, and early death. The importance of regular follow-ups was  discussed with the patient. Low-salt diet such as DASH recommended.  Patient overall is doing well. She will do lab work in the coming weeks

## 2016-01-14 ENCOUNTER — Encounter: Payer: Self-pay | Admitting: Family Medicine

## 2016-01-14 LAB — LIPID PANEL
CHOL/HDL RATIO: 3.6 ratio (ref 0.0–4.4)
Cholesterol, Total: 203 mg/dL — ABNORMAL HIGH (ref 100–199)
HDL: 57 mg/dL (ref >39)
LDL Calculated: 121 mg/dL — ABNORMAL HIGH (ref 0–99)
Triglycerides: 124 mg/dL (ref 0–149)
VLDL Cholesterol Cal: 25 mg/dL (ref 5–40)

## 2016-01-14 LAB — BASIC METABOLIC PANEL
BUN / CREAT RATIO: 20 (ref 9–23)
BUN: 19 mg/dL (ref 6–24)
CO2: 25 mmol/L (ref 18–29)
Calcium: 9.6 mg/dL (ref 8.7–10.2)
Chloride: 102 mmol/L (ref 96–106)
Creatinine, Ser: 0.97 mg/dL (ref 0.57–1.00)
GFR, EST AFRICAN AMERICAN: 75 mL/min/{1.73_m2} (ref >59)
GFR, EST NON AFRICAN AMERICAN: 65 mL/min/{1.73_m2} (ref >59)
Glucose: 105 mg/dL — ABNORMAL HIGH (ref 65–99)
POTASSIUM: 4.4 mmol/L (ref 3.5–5.2)
SODIUM: 142 mmol/L (ref 134–144)

## 2016-02-03 ENCOUNTER — Other Ambulatory Visit: Payer: Self-pay | Admitting: Gastroenterology

## 2016-02-14 NOTE — Progress Notes (Signed)
[name], MD [place] [address]    DIAGNOSIS: Breast cancer of lower-outer quadrant of left female breast   Staging form: Breast, AJCC 7th Edition     Clinical: No stage assigned - Unsigned  Stage I (pT1b pNOMx) infiltrating ductal carcinoma of the left breast, status post lumpectomy and axillary sentinel node sampling, ER/PR positive, HER-2/neu not overexpressed, Oncotype DX score of 7, adjuvant XRT. On Arimidex.  Biopsy was performed on 04/05/2014 at which time a combination of infiltrating ductal carcinoma and noninvasive ductal neoplasia were found, ER 100%, PR 100%  Ulcerative colitis, controlled.  DEXA 05/2014 with osteoporosis COLONOSCOPY 04/18/2014  SUMMARY OF ONCOLOGIC HISTORY:   Breast cancer of lower-outer quadrant of left female breast (Beth Hamilton)   05/24/2014 Initial Diagnosis    Breast cancer of lower-outer quadrant of left female breast     06/22/2014 - 08/05/2014 Radiation Therapy    Left breast 50 Gy at 2 Gy per fraction x 25 fractions with left breast boost 10 Gy at 2 Gy per fraction x 5 fractions by Dr. Pablo Ledger.      CURRENT THERAPY: Arimidex  INTERVAL HISTORY: Beth Hamilton 60 y.o. female returns for follow-up of a stage I Left breast cancer. She is currently on Arimidex. DEXA scan performed in November 2015 showed osteoporosis.   She is to receive prolia today. She notes she is trying to do better with routine calcium and vitamin D. She is compliant with her arimidex taking it daily.   Her mood is good. Energy is described as baseline. No new pain. No SOB or CP. Appetite is unchanged.  Last mammogram was in September of last year. She will be due again in September. She gets routine mammography. No breast concerns today.  Last C-scope in 2015.   MEDICAL HISTORY: Past Medical History:  Diagnosis Date  . Breast cancer (Beth Hamilton) 04/20/14   left breast  . Complication of anesthesia    not sure what med was given in 96 with  D&C, was very sore all over. Dr Patsey Berthold did anesthesia. Dr Heide Spark did surgery  . Hemorrhoids   . History of breast cancer 05/19/2015  . Hypertension   . Hypothyroidism   . IBD (inflammatory bowel disease)    UC  . Pneumonia   . Psoriasis   . Thyroid disease   . Ulcerative colitis (Beth Hamilton) 5 1 2007   Dr Gala Romney    has HEMORRHOIDS; UC (ulcerative colitis) ([place]); DIARRHEA; ABDOMINAL CRAMPS; HTN (hypertension), benign; Breast cancer of lower-outer quadrant of left female breast (Beth Hamilton); Hypothyroidism; Osteoporosis; History of breast cancer; Hyperlipidemia; and FUO (fever of unknown origin) on her problem list.     is allergic to flagyl [metronidazole]; other; and levaquin [levofloxacin in d5w].  Ms. Sebek had no medications administered during this visit.  SURGICAL HISTORY: Past Surgical History:  Procedure Laterality Date  . COLONOSCOPY  10/09/2001   RMR: Internal hemorrhoids and anal papilla; otherwise normal rectum  . COLONOSCOPY  12/10/2005   incomplete tcs-  colitis  . COLONOSCOPY N/A 04/18/2014   Dr.Rourk- abnormal rectum & L colon into the mid descending segment. mucosa was erythematious as well as pale w/ some loss of normal vascular apttern. no erosions or ulcers. pt had some smooth peduculated 3-4cm polyps in the sigmoid segment most c/w pseudopolyps. the rest of the colonic mucosa appeared normal. bx= inflammatory polyps  . DILATION AND CURETTAGE OF UTERUS  1996   Dr Heide Spark  . FLEXIBLE SIGMOIDOSCOPY  02/26/2006   endoscopically normal-appearing rectum, colitis in sigmoid mucosa  . PARTIAL MASTECTOMY WITH NEEDLE LOCALIZATION AND AXILLARY SENTINEL LYMPH NODE BX Left 04/20/2014   Procedure: PARTIAL MASTECTOMY AFTER NEEDLE LOCALIZATION AND AXILLARY SENTINEL LYMPH NODE BX;  Surgeon: Jamesetta So, MD;  Location: [facility];  Service: General;  Laterality: Left;  . TUBAL LIGATION    . WISDOM TOOTH EXTRACTION      SOCIAL HISTORY: Social History   Social History  . Marital status:  Married    Spouse name: N/A  . Number of children: N/A  . Years of education: N/A   Occupational History  . Not on file.   Social History Main Topics  . Smoking status: Former Smoker    Packs/day: 0.50    Years: 15.00    Types: Cigarettes    Quit date: 07/26/2005  . Smokeless tobacco: Never Used  . Alcohol use Yes     Comment: wine every day  . Drug use: No  . Sexual activity: Yes    Birth control/ protection: Post-menopausal   Other Topics Concern  . Not on file   Social History Narrative  . No narrative on file    FAMILY HISTORY: Family History  Problem Relation Age of Onset  . Colon cancer Neg Hx   . Stroke Maternal Grandfather   . Heart attack Maternal Grandfather   . Hypertension Mother   . Diabetes Paternal Grandfather   . Heart disease Paternal Grandfather     Review of Systems  Constitutional: Negative for chills, fever, weight loss and malaise/fatigue.  HENT: Negative for congestion, hearing loss, nosebleeds, sore throat and tinnitus.   Eyes: Negative for blurred vision, double vision, pain and discharge.  Respiratory: Negative for cough, hemoptysis, sputum production, shortness of breath and wheezing.   Cardiovascular: Negative for chest pain, palpitations, claudication, leg swelling and PND.  Gastrointestinal: Negative for heartburn, nausea, vomiting, abdominal pain, diarrhea, constipation, blood in stool and melena.  Genitourinary: Negative for dysuria, urgency, frequency and hematuria.  Musculoskeletal: Negative for myalgias, joint pain and falls.  Skin: Negative for rash.  Neurological: Negative for dizziness, tingling, tremors, sensory change, speech change, focal weakness, seizures, loss of consciousness, weakness and headaches.  Endo/Heme/Allergies: Does not bruise/bleed easily.  Psychiatric/Behavioral: Negative for depression, suicidal ideas, memory loss and substance abuse. The patient is not nervous/anxious and does not have insomnia.    14 point  review of systems was performed and is negative except as detailed under history of present illness and above   PHYSICAL EXAMINATION  ECOG PERFORMANCE STATUS: 0 - Asymptomatic  Temp 98.7 F (37.1 C) (Oral)   Resp 18   Wt 184 lb (83.5 kg)   SpO2 99%   BMI 31.58 kg/m   Physical Exam  Constitutional: She is oriented to person, place, and time and well-developed, well-nourished, and in no distress.  HENT:  Head: Normocephalic and atraumatic.  Nose: Nose normal.  Mouth/Throat: Oropharynx is clear and moist. No oropharyngeal exudate.  Eyes: Conjunctivae and EOM are normal. Pupils are equal, round, and reactive to light. Right eye exhibits no discharge. Left eye exhibits no discharge. No scleral icterus.  Neck: Normal range of motion. Neck supple. No tracheal deviation present. No thyromegaly present.  Cardiovascular: Normal rate, regular rhythm and normal heart sounds.  Exam reveals no gallop and no friction rub.   No murmur heard. Pulmonary/Chest: Effort normal and breath sounds normal. She has no wheezes. She has no rales.  Abdominal: Soft. Bowel sounds are normal. She  exhibits no distension and no mass. There is no tenderness. There is no rebound and no guarding.  Musculoskeletal: Normal range of motion. She exhibits no edema.  Lymphadenopathy:    She has no cervical adenopathy. No palpable axillary adenopathy or supraclavicular adenopathy Neurological: She is alert and oriented to person, place, and time. She has normal reflexes. No cranial nerve deficit. Gait normal. Coordination normal.  Skin: Skin is warm and dry. No rash noted.  Psychiatric: Mood, memory, affect and judgment normal.  Nursing note and vitals reviewed.  LABORATORY DATA: I have reviewed the data as listed.  CBC    Component Value Date/Time   WBC 6.5 08/21/2015 0959   RBC 4.44 08/21/2015 0959   HGB 14.7 08/21/2015 0959   HCT 43.6 08/21/2015 0959   HCT 34.9 04/26/2015 0815   PLT 252 08/21/2015 0959   PLT  520 (H) 04/26/2015 0815   MCV 98.2 08/21/2015 0959   MCV 95 04/26/2015 0815   MCH 33.1 08/21/2015 0959   MCHC 33.7 08/21/2015 0959   RDW 14.5 08/21/2015 0959   RDW 12.6 04/26/2015 0815   LYMPHSABS 1.4 08/21/2015 0959   LYMPHSABS 1.0 04/26/2015 0815   MONOABS 0.5 08/21/2015 0959   EOSABS 0.1 08/21/2015 0959   EOSABS 0.2 04/26/2015 0815   BASOSABS 0.0 08/21/2015 0959   BASOSABS 0.0 04/26/2015 0815   CMP     Component Value Date/Time   NA 142 01/13/2016 0847   K 4.4 01/13/2016 0847   CL 102 01/13/2016 0847   CO2 25 01/13/2016 0847   GLUCOSE 105 (H) 01/13/2016 0847   GLUCOSE 86 07/07/2015 1038   BUN 19 01/13/2016 0847   CREATININE 0.97 01/13/2016 0847   CREATININE 0.69 11/20/2013 1004   CALCIUM 9.6 01/13/2016 0847   PROT 7.8 07/07/2015 1038   ALBUMIN 4.1 07/07/2015 1038   AST 24 07/07/2015 1038   ALT 24 07/07/2015 1038   ALKPHOS 79 07/07/2015 1038   BILITOT 0.3 07/07/2015 1038   GFRNONAA 65 01/13/2016 0847   GFRAA 75 01/13/2016 0847   ASSESSMENT and THERAPY PLAN:  Stage I ER positive, PR positive, HER-2 negative carcinoma of the left breast. Osteoporosis, on prolia Macrocytosis Low B12 Low grade fever, markedly elevated CRP 124 mg/L, history Ulcerative colitis HTN Leukocytosis on prior prednisone Candida, B inframamary folds  She will currently continue on Arimidex. She has excellent tolerance. The goal is for 5 years of tNherapy. We will consider doing a breast cancer index to see if she will benefit from longer duration therapy. Her bone density will have to be closely monitored. Next bone density will be due in November. She will receive prolia today.   She has been taking calcium and vitamin D.   We may need to consider monitoring a urine NTX. I have addressed this with her. She is willing to continue on the medication.   She is to continue on folic acid and Z56.   She will be due for screening mammography in September.  She will return in 4 months for a follow  up at which time we will move her visits out to 6 month intervals  NCCN guidelines recommends the following surveillance for invasive breast cancer:  A. History and Physical exam every 4-6 months for 5 years and then every 12 months.  B. Mammography every 12 months  C. Women on Tamoxifen: annual gynecologic assessment every 12 months if uterus is present.  D. Women on aromatase inhibitor or who experience ovarian failure secondary to treatment  should have monitoring of bone health with a bone mineral density determination at baseline and periodically thereafter.  E. Assess and encourage adherence to adjuvant endocrine therapy.  F. Evidence suggests that active lifestyle and achieving and maintaining an ideal body weight (20-25 BMI) may lead to optimal breast cancer outcomes.  In a two-year randomized trial of denosumab (60 mg subcutaneously every six months) versus placebo in 250 postmenopausal osteopenic (mean T-scores -0.88 to -1.33) women receiving adjuvant AI therapy, subjects in the denosumab group had significant increases in LS, TH, and femoral neck BMD compared with placebo (between group differences of 7.6, 4.7, and 3.6 percent, respectively).There were no vertebral fractures, and nonvertebral fractures occurred in eight subjects in each group. The most common side effects included arthralgia, back pain, and fatigue.  All questions were answered. The patient knows to call the clinic with any problems, questions or concerns. We can certainly see the patient much sooner if necessary.   This document serves as a record of services personally performed by Ancil Linsey, MD. It was created on her behalf by Arlyce Harman, a trained medical scribe. The creation of this record is based on the scribe's personal observations and the provider's statements to them. This document has been checked and approved by the attending provider.  I have reviewed the above documentation for accuracy and  completeness, and I agree with the above.  This note was electronically signed.  Kelby Fam. Penland, MD 02/15/2016

## 2016-02-15 ENCOUNTER — Encounter (HOSPITAL_COMMUNITY): Payer: Managed Care, Other (non HMO) | Attending: Hematology & Oncology | Admitting: Hematology & Oncology

## 2016-02-15 ENCOUNTER — Ambulatory Visit (HOSPITAL_COMMUNITY): Payer: Managed Care, Other (non HMO)

## 2016-02-15 ENCOUNTER — Encounter (HOSPITAL_COMMUNITY): Payer: Self-pay | Admitting: Hematology & Oncology

## 2016-02-15 ENCOUNTER — Encounter (HOSPITAL_COMMUNITY): Payer: Managed Care, Other (non HMO)

## 2016-02-15 ENCOUNTER — Telehealth (HOSPITAL_COMMUNITY): Payer: Self-pay | Admitting: Hematology & Oncology

## 2016-02-15 VITALS — Temp 98.7°F | Resp 18 | Wt 184.0 lb

## 2016-02-15 DIAGNOSIS — C50512 Malignant neoplasm of lower-outer quadrant of left female breast: Secondary | ICD-10-CM

## 2016-02-15 DIAGNOSIS — D7589 Other specified diseases of blood and blood-forming organs: Secondary | ICD-10-CM | POA: Diagnosis not present

## 2016-02-15 DIAGNOSIS — M810 Age-related osteoporosis without current pathological fracture: Secondary | ICD-10-CM | POA: Diagnosis not present

## 2016-02-15 DIAGNOSIS — I10 Essential (primary) hypertension: Secondary | ICD-10-CM

## 2016-02-15 DIAGNOSIS — E538 Deficiency of other specified B group vitamins: Secondary | ICD-10-CM

## 2016-02-15 DIAGNOSIS — Z170 Estrogen receptor positive status [ER+]: Secondary | ICD-10-CM

## 2016-02-15 LAB — COMPREHENSIVE METABOLIC PANEL
ALBUMIN: 4.2 g/dL (ref 3.5–5.0)
ALT: 21 U/L (ref 14–54)
ANION GAP: 7 (ref 5–15)
AST: 33 U/L (ref 15–41)
Alkaline Phosphatase: 89 U/L (ref 38–126)
BILIRUBIN TOTAL: 0.9 mg/dL (ref 0.3–1.2)
BUN: 16 mg/dL (ref 6–20)
CALCIUM: 9.3 mg/dL (ref 8.9–10.3)
CO2: 30 mmol/L (ref 22–32)
Chloride: 101 mmol/L (ref 101–111)
Creatinine, Ser: 0.79 mg/dL (ref 0.44–1.00)
GFR calc Af Amer: >60 mL/min (ref >60)
GLUCOSE: 140 mg/dL — AB (ref 65–99)
POTASSIUM: 3.9 mmol/L (ref 3.5–5.1)
Sodium: 138 mmol/L (ref 135–145)
Total Protein: 7.8 g/dL (ref 6.5–8.1)

## 2016-02-15 MED ORDER — ANASTROZOLE 1 MG PO TABS: 1.0000 mg | ORAL_TABLET | Freq: Every day | ORAL | 3 refills | Status: DC

## 2016-02-15 MED ORDER — DIAZEPAM 5 MG PO TABS
5.0000 mg | ORAL_TABLET | Freq: Four times a day (QID) | ORAL | 1 refills | Status: DC | PRN
Start: 2016-02-15 — End: 2016-06-19

## 2016-02-15 MED ORDER — DENOSUMAB 60 MG/ML ~~LOC~~ SOLN
60.0000 mg | Freq: Once | SUBCUTANEOUS | Status: AC
  Administered 2016-02-15: 60 mg via SUBCUTANEOUS
  Filled 2016-02-15: qty 1

## 2016-02-15 NOTE — Progress Notes (Signed)
Beth Hamilton presents today for injection per the provider's orders.  Prolia administration without incident; see MAR for injection details.  Patient tolerated procedure well and without incident.  No questions or complaints noted at this time.

## 2016-02-15 NOTE — Telephone Encounter (Signed)
PC TO [employer] SPK WITH LILLY ?'D IF [id number] REQ AUTH. IT DOES MUST CONTACT [employer] @ [place] TO [phone]. PLAN PAYS 80/20 $200 DED $1000.00 OOP PLAN REQ PRECERT EVEN THOUGH THEY ARE 2NDRY  PC TO [employer] SPOKE TO ASHLEY  ?'D IF [id number] REQ [place]. IT DOES ADVISED TO CALL [phone]. CALL REF # [id number]  SPK WITH KATHY @ [employer] RX [phone] AND SHE WILL FAX PRE AUTH FORM TO MY ATTENTION RETURN FAX TO [phone] F  PER AMY N PT RECVD PROLIA TODAY PER DR PENLANDS OK. PT WAS ADVISED IN ADVANCE THAT SHOT HAD NOT BEEN AUTH'D.

## 2016-02-15 NOTE — Patient Instructions (Signed)
[place] at [facility] Discharge Instructions  RECOMMENDATIONS MADE BY THE CONSULTANT AND ANY TEST RESULTS WILL BE SENT TO YOUR REFERRING PHYSICIAN.  You were seen by Dr. Whitney Muse today Prolia in 6 months  RTC in 4 months 9/16 Diagnostic  Thank you for choosing [place] at [facility] to provide your oncology and hematology care.  To afford each patient quality time with our provider, please arrive at least 15 minutes before your scheduled appointment time.   Beginning January 23rd 2017 lab work for the [facility] will be done in the  Main lab at [facility] on 1st floor. If you have a lab appointment with the [facility] please come in thru the  Main Entrance and check in at the main information desk  You need to re-schedule your appointment should you arrive 10 or more minutes late.  We strive to give you quality time with our providers, and arriving late affects you and other patients whose appointments are after yours.  Also, if you no show three or more times for appointments you may be dismissed from the clinic at the providers discretion.     Again, thank you for choosing [facility].  Our hope is that these requests will decrease the amount of time that you wait before being seen by our physicians.       _____________________________________________________________  Should you have questions after your visit to [facility], please contact our office at [phone] between the hours of 8:30 a.m. and 4:30 p.m.  Voicemails left after 4:30 p.m. will not be returned until the following business day.  For prescription refill requests, have your pharmacy contact our office.         Resources For Cancer Patients and their Caregivers ? American Cancer Society: Can assist with transportation, wigs, general needs, runs Look Good Feel Better.        305-140-2204 ? Cancer Care: Provides financial  assistance, online support groups, medication/co-pay assistance.  1-800-813-HOPE (240)380-9024) ? [place] Assists [place] cancer patients and their families through emotional , educational and financial support.  802-671-3029 ? [place] DSS Where to apply for food stamps, Medicaid and utility assistance. 323 738 1626 ? [facility]: Transportation to medical appointments. 918-001-9148 ? Social Security Administration: May apply for disability if have a Stage IV cancer. 212-094-7218 (939)316-0318 ? [employer], Disability and Transit Services: Assists with nutrition, care and transit needs. [place] Support Programs: @10RELATIVEDAYS @ > Cancer Support Group  2nd Tuesday of the month 1pm-2pm, Journey Room  > Creative Journey  3rd Tuesday of the month 1130am-1pm, Journey Room  > Look Good Feel Better  1st Wednesday of the month 10am-12 noon, Journey Room (Call [place] to register [phone])

## 2016-02-19 ENCOUNTER — Telehealth (HOSPITAL_COMMUNITY): Payer: Self-pay | Admitting: Hematology & Oncology

## 2016-02-19 NOTE — Telephone Encounter (Signed)
EVEN THOUGH PROLIA WAS GIVEN TO PT PER DR PENLAND'S APPROVAL I SUBMITTED AUTH REQ TO BOTH [employer] AND [employer] . [employer] DENIED [place]. WILL GIVE TO DR PENLAND TO SEE IF SHE WANTS TO APPEAL.

## 2016-03-01 ENCOUNTER — Other Ambulatory Visit: Payer: Self-pay | Admitting: Obstetrics & Gynecology

## 2016-03-01 DIAGNOSIS — Z853 Personal history of malignant neoplasm of breast: Secondary | ICD-10-CM

## 2016-03-09 ENCOUNTER — Encounter (HOSPITAL_COMMUNITY): Payer: Self-pay | Admitting: Hematology & Oncology

## 2016-03-16 ENCOUNTER — Other Ambulatory Visit: Payer: Self-pay | Admitting: Nurse Practitioner

## 2016-03-20 ENCOUNTER — Other Ambulatory Visit: Payer: Self-pay

## 2016-03-20 ENCOUNTER — Telehealth: Payer: Self-pay

## 2016-03-20 NOTE — Telephone Encounter (Signed)
rx sent for one year

## 2016-03-20 NOTE — Telephone Encounter (Signed)
Pt called- her pharmacy has sent in a refill request. She is asking for more than one refill at a time. She said she is getting tired of having to call for refills all the time. She also said she feels like the balsalazide is working better than the lialda and she is feeling great.

## 2016-04-12 ENCOUNTER — Ambulatory Visit
Admission: RE | Admit: 2016-04-12 | Discharge: 2016-04-12 | Disposition: A | Discharge status: Home / Self Care | Payer: Managed Care, Other (non HMO) | Source: Ambulatory Visit | Attending: Obstetrics & Gynecology | Admitting: Obstetrics & Gynecology

## 2016-04-12 DIAGNOSIS — Z853 Personal history of malignant neoplasm of breast: Secondary | ICD-10-CM

## 2016-05-06 ENCOUNTER — Encounter: Payer: Self-pay | Admitting: Internal Medicine

## 2016-05-22 ENCOUNTER — Encounter: Payer: Self-pay | Admitting: Adult Health

## 2016-05-22 ENCOUNTER — Ambulatory Visit (INDEPENDENT_AMBULATORY_CARE_PROVIDER_SITE_OTHER): Payer: Managed Care, Other (non HMO) | Admitting: Adult Health

## 2016-05-22 VITALS — BP 170/102 | HR 78 | Ht 63.25 in | Wt 185.5 lb

## 2016-05-22 DIAGNOSIS — Z1212 Encounter for screening for malignant neoplasm of rectum: Secondary | ICD-10-CM | POA: Diagnosis not present

## 2016-05-22 DIAGNOSIS — Z01419 Encounter for gynecological examination (general) (routine) without abnormal findings: Secondary | ICD-10-CM

## 2016-05-22 DIAGNOSIS — Z853 Personal history of malignant neoplasm of breast: Secondary | ICD-10-CM

## 2016-05-22 DIAGNOSIS — I10 Essential (primary) hypertension: Secondary | ICD-10-CM

## 2016-05-22 LAB — HEMOCCULT GUIAC POC 1CARD (OFFICE): FECAL OCCULT BLD: NEGATIVE

## 2016-05-22 MED ORDER — SULFAMETHOXAZOLE-TRIMETHOPRIM 800-160 MG PO TABS: ORAL_TABLET | ORAL | 0 refills | Status: DC

## 2016-05-22 NOTE — Progress Notes (Signed)
Patient ID: Beth Hamilton, female   DOB: 21-Jun-1956, 60 y.o.   MRN: [id number] History of Present Illness:  Beth Hamilton is a 60 year old white female,married in for well woman gyn exam, she had normal pap with negative HPV 05/19/15.She requests rx for septra ds to take after sex, has used in past with good results.She is sees Dr Whitney Muse, has had breast cancer. She said she just took BP meds. PCP is [facility].  Current Medications, Allergies, Past Medical History, Past Surgical History, Family History and Social History were reviewed in [employer] record.     Review of Systems:  Patient denies any headaches, hearing loss, fatigue, blurred vision, shortness of breath, chest pain, abdominal pain, problems with bowel movements, urination, or intercourse. No joint pain or mood swings.   Physical Exam:BP (!) 170/102 (BP Location: Right Arm, Patient Position: Sitting, Cuff Size: Normal)   Pulse 78   Ht 5' 3.25" (1.607 m)   Wt 185 lb 8 oz (84.1 kg)   BMI 32.60 kg/m BP was 180/98 on arrival. General:  Well developed, well nourished, no acute distress Skin:  Warm and dry Neck:  Midline trachea, normal thyroid, good ROM, no lymphadenopathy Lungs; Clear to auscultation bilaterally Breast:  No dominant palpable mass, retraction, or nipple discharge Cardiovascular: Regular rate and rhythm Abdomen:  Soft, non tender, no hepatosplenomegaly Pelvic:  External genitalia is normal in appearance, no lesions.  The vagina is normal in appearance. Urethra has no lesions or masses. The cervix is smooth.  Uterus is felt to be normal size, shape, and contour.  No adnexal masses or tenderness noted.Bladder is non tender, no masses felt. Rectal: Good sphincter tone, no polyps, or hemorrhoids felt.  Hemoccult negative. Extremities/musculoskeletal:  No swelling or varicosities noted, no clubbing or cyanosis Psych:  No mood changes, alert and cooperative,seems happy PHQ 2 score  0  Impression:  1. Well woman exam with routine gynecological exam   2. History of breast cancer   3. HTN (hypertension), benign      Plan: Rx septra ds #30 take 1 after sex Physical in 1 year Pap in 2019 Mammogram yearly Colonoscopy per GI F/U with Dr Wolfgang [place] on BP, has appt this month Labs with PCP

## 2016-05-22 NOTE — Patient Instructions (Addendum)
Physical in 1 year Pap in 2019 Mammogram yearly Colonoscopy per GI F/U with Dr Wolfgang [place] on BP, has appt this month Labs with PCP

## 2016-06-06 IMAGING — DX DG CHEST 2V
2 series · 2 of 2 positions shown · non-contrast
Comparison: PA and lateral chest x-ray dated April 15, 2014

CLINICAL DATA: Two weeks of chest congestion cough and fever,
history of left breast malignancy.

EXAM:
CHEST  2 VIEW

[chest pa]
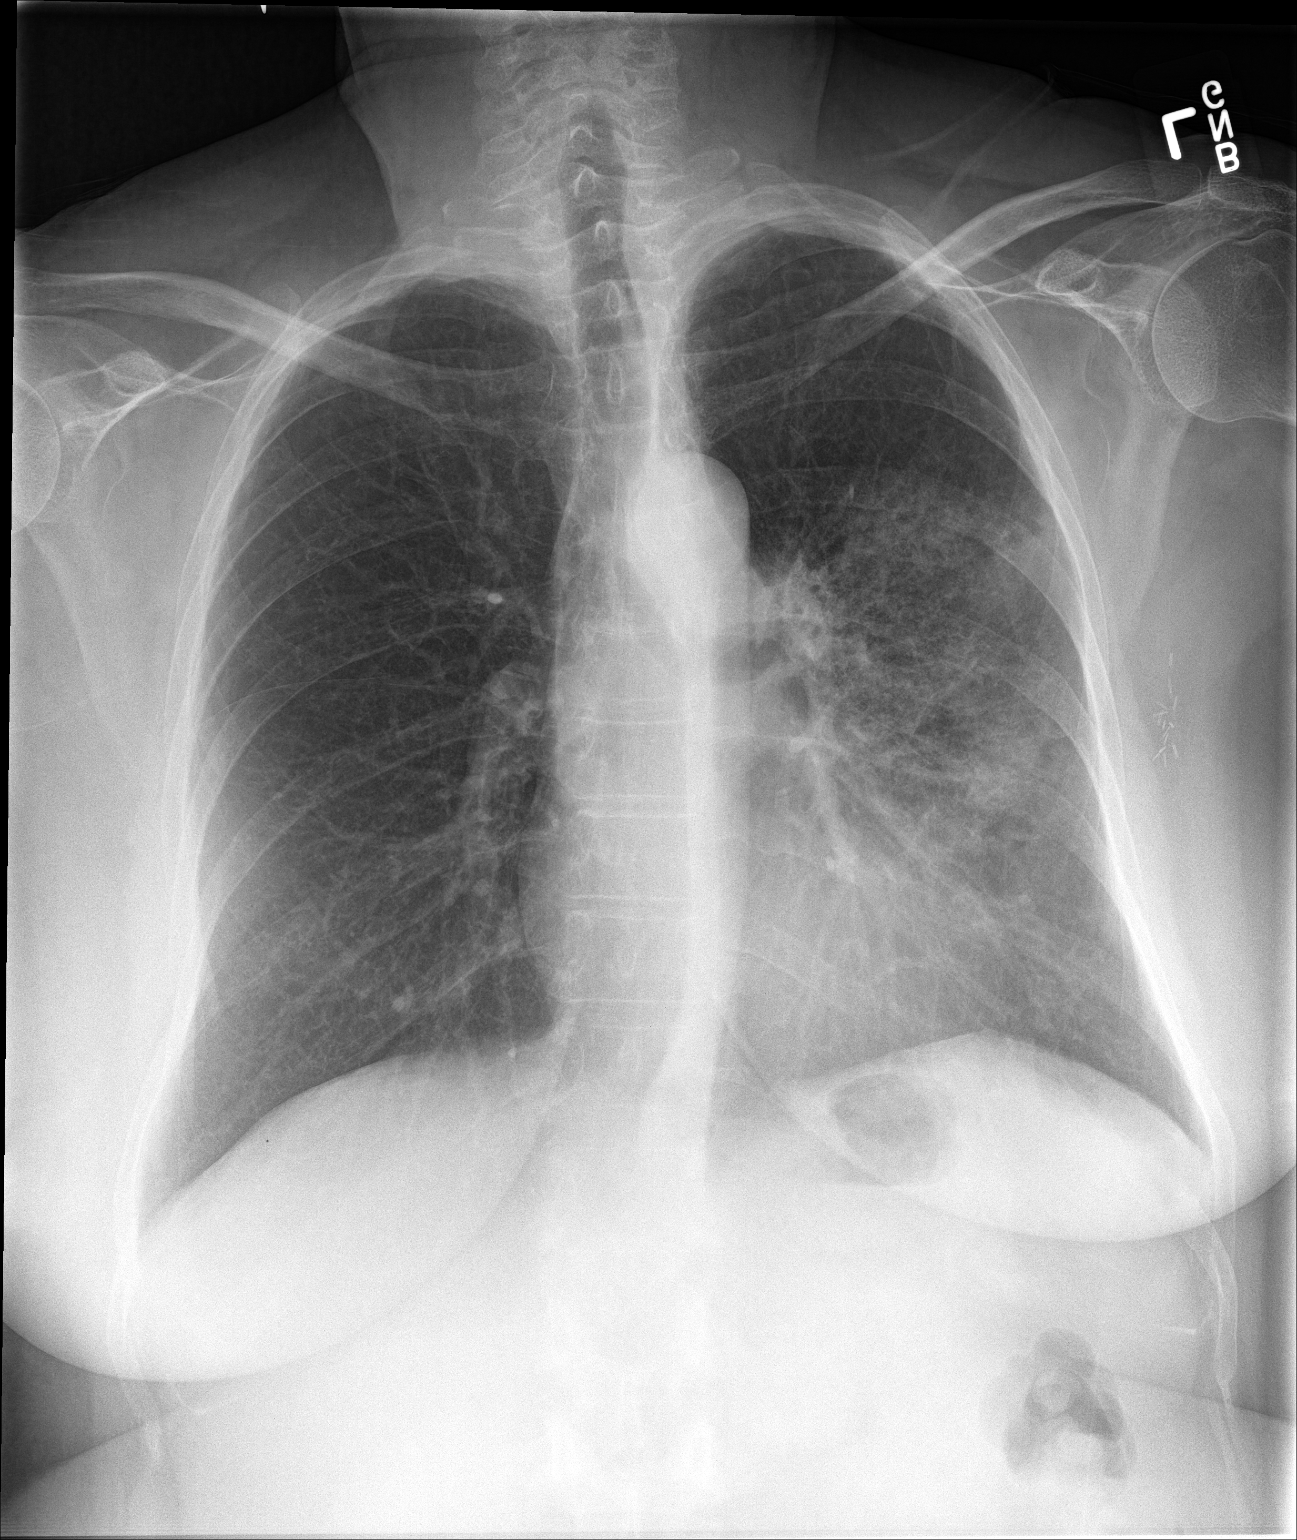

[chest lat]
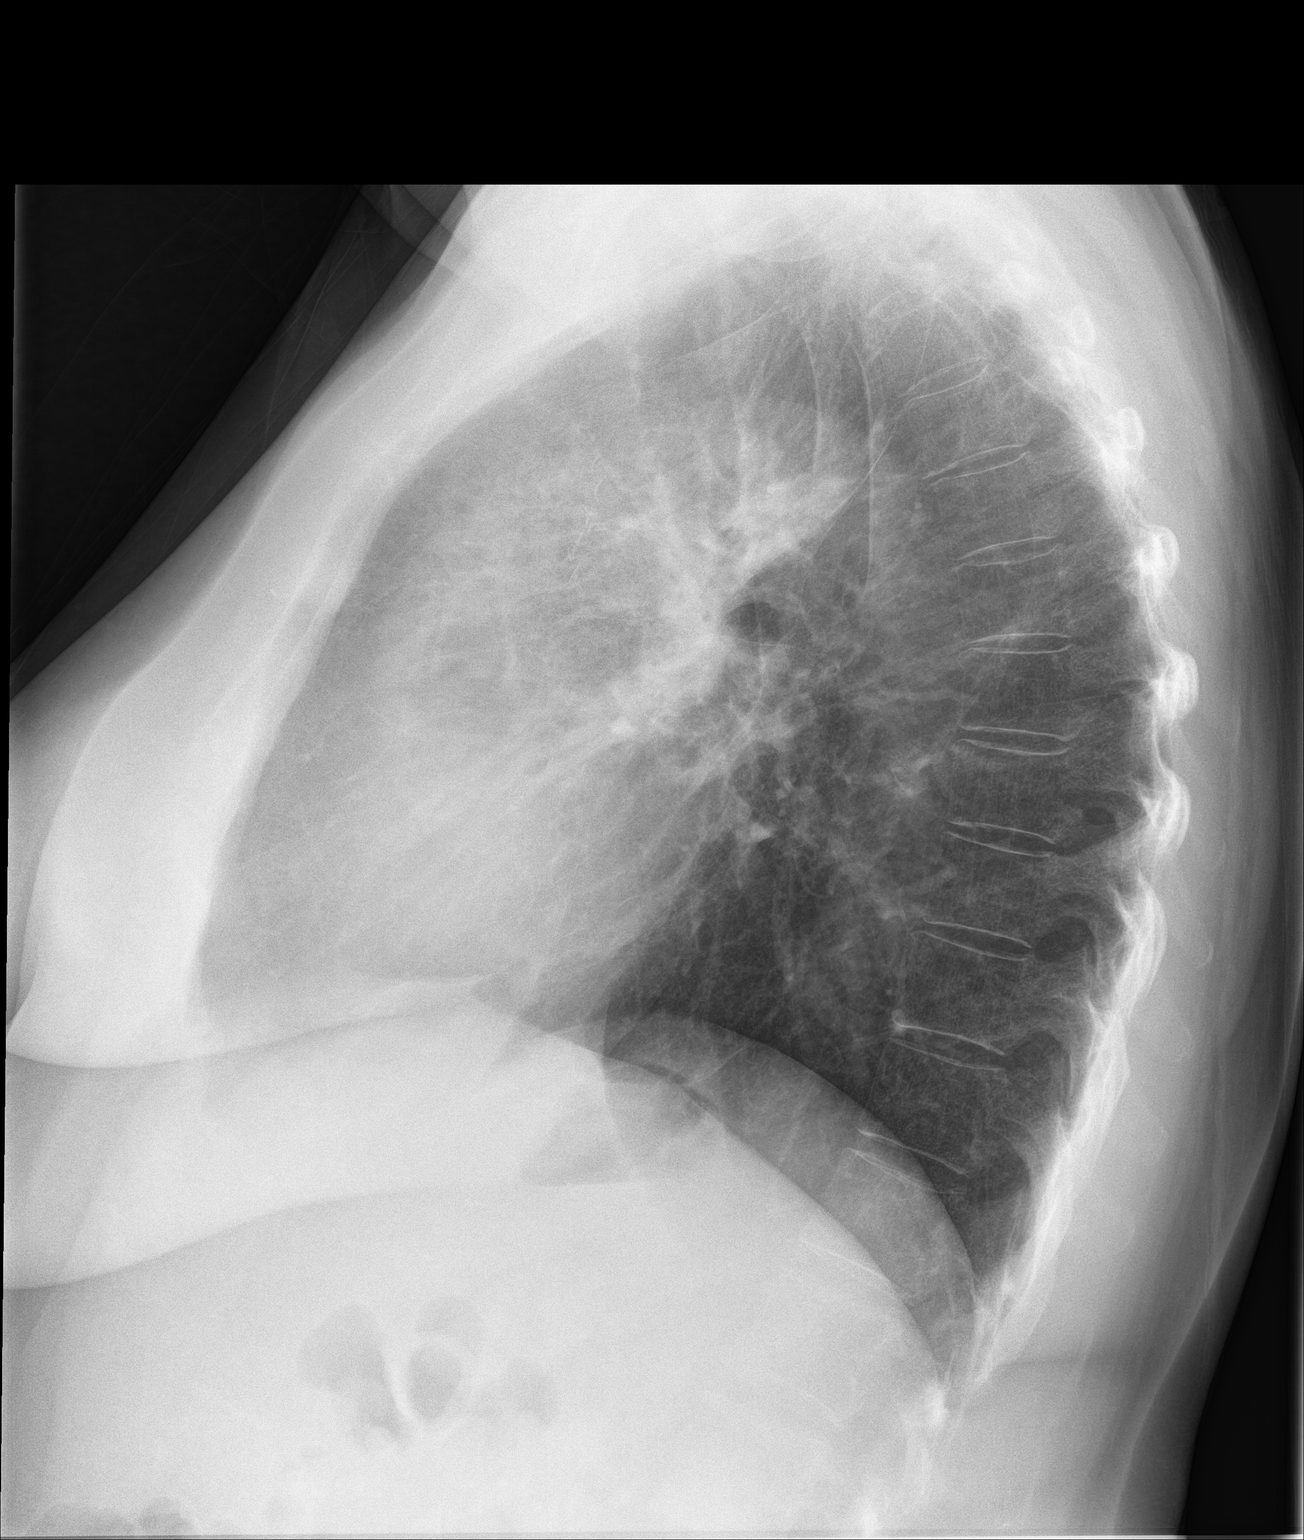

[2 of 2 positions shown; findings below may reference images not displayed]

FINDINGS: There is new interstitial infiltrate in the anterior aspect of the
left upper lobe. The right lung is clear. There is no pleural
effusion or pneumothorax. The heart and pulmonary vascularity are
normal. The mediastinum is not shifted. There surgical clips in the
left axillary region likely due to lymph node dissection. The bony
thorax exhibits no acute abnormality.
IMPRESSION: Interstitial infiltrate left upper lobe compatible with pneumonia.
Followup PA and lateral chest X-ray is recommended in 3-4 weeks
following trial of antibiotic therapy to ensure resolution and
exclude underlying malignancy.

## 2016-06-07 ENCOUNTER — Ambulatory Visit (INDEPENDENT_AMBULATORY_CARE_PROVIDER_SITE_OTHER): Payer: Managed Care, Other (non HMO) | Admitting: Family Medicine

## 2016-06-07 ENCOUNTER — Encounter: Payer: Self-pay | Admitting: Family Medicine

## 2016-06-07 VITALS — BP 120/84 | Ht 64.0 in | Wt 185.0 lb

## 2016-06-07 DIAGNOSIS — Z23 Encounter for immunization: Secondary | ICD-10-CM

## 2016-06-07 DIAGNOSIS — I10 Essential (primary) hypertension: Secondary | ICD-10-CM

## 2016-06-07 MED ORDER — LISINOPRIL 10 MG PO TABS: 10.0000 mg | ORAL_TABLET | Freq: Every day | ORAL | 6 refills | Status: DC

## 2016-06-07 NOTE — Progress Notes (Signed)
   Subjective:    Patient ID: Beth Hamilton, female    DOB: April 09, 1956, 60 y.o.   MRN: [id number]  Hypertension  This is a chronic problem. The current episode started more than 1 year ago. The problem has been gradually improving since onset. Pertinent negatives include no chest pain, headaches or shortness of breath. There are no associated agents to hypertension. There are no known risk factors for coronary artery disease. Treatments tried: lisinopril. The current treatment provides moderate improvement. There are no compliance problems.    Patient has no concerns at this time.  Patient overall doing well. She does try to follow a healthy diet to some degree she does some walking but no regular intense exercise.  Review of Systems  Constitutional: Negative for activity change, fatigue and fever.  Respiratory: Negative for cough and shortness of breath.   Cardiovascular: Negative for chest pain and leg swelling.  Neurological: Negative for headaches.       Objective:   Physical Exam  Constitutional: She appears well-nourished. No distress.  Cardiovascular: Normal rate, regular rhythm and normal heart sounds.   No murmur heard. Pulmonary/Chest: Effort normal and breath sounds normal. No respiratory distress.  Musculoskeletal: She exhibits no edema.  Lymphadenopathy:    She has no cervical adenopathy.  Neurological: She is alert. She exhibits normal muscle tone.  Psychiatric: Her behavior is normal.  Vitals reviewed.         Assessment & Plan:  HTN decent control continue current measures watch diet closely exercise try to lose weight try to keep lower diastolic number in the 23X if possible continue current medication follow-up in 6 months Lab work and office visit in 6 months Osteoporosis followed by hematology oncology

## 2016-06-07 NOTE — Patient Instructions (Signed)
DASH Eating Plan DASH stands for "Dietary Approaches to Stop Hypertension." The DASH eating plan is a healthy eating plan that has been shown to reduce high blood pressure (hypertension). Additional health benefits may include reducing the risk of type 2 diabetes mellitus, heart disease, and stroke. The DASH eating plan may also help with weight loss. What do I need to know about the DASH eating plan? For the DASH eating plan, you will follow these general guidelines:  Choose foods with less than 150 milligrams of sodium per serving (as listed on the food label).  Use salt-free seasonings or herbs instead of table salt or sea salt.  Check with your health care provider or pharmacist before using salt substitutes.  Eat lower-sodium products. These are often labeled as "low-sodium" or "no salt added."  Eat fresh foods. Avoid eating a lot of canned foods.  Eat more vegetables, fruits, and low-fat dairy products.  Choose whole grains. Look for the word "whole" as the first word in the ingredient list.  Choose fish and skinless chicken or turkey more often than red meat. Limit fish, poultry, and meat to 6 oz (170 g) each day.  Limit sweets, desserts, sugars, and sugary drinks.  Choose heart-healthy fats.  Eat more home-cooked food and less restaurant, buffet, and fast food.  Limit fried foods.  Do not fry foods. Cook foods using methods such as baking, boiling, grilling, and broiling instead.  When eating at a restaurant, ask that your food be prepared with less salt, or no salt if possible. What foods can I eat? Seek help from a dietitian for individual calorie needs. Grains  Whole grain or whole wheat bread. Brown rice. Whole grain or whole wheat pasta. Quinoa, bulgur, and whole grain cereals. Low-sodium cereals. Corn or whole wheat flour tortillas. Whole grain cornbread. Whole grain crackers. Low-sodium crackers. Vegetables  Fresh or frozen vegetables (raw, steamed, roasted, or  grilled). Low-sodium or reduced-sodium tomato and vegetable juices. Low-sodium or reduced-sodium tomato sauce and paste. Low-sodium or reduced-sodium canned vegetables. Fruits  All fresh, canned (in natural juice), or frozen fruits. Meat and Other Protein Products  Ground beef (85% or leaner), grass-fed beef, or beef trimmed of fat. Skinless chicken or turkey. Ground chicken or turkey. Pork trimmed of fat. All fish and seafood. Eggs. Dried beans, peas, or lentils. Unsalted nuts and seeds. Unsalted canned beans. Dairy  Low-fat dairy products, such as skim or 1% milk, 2% or reduced-fat cheeses, low-fat ricotta or cottage cheese, or plain low-fat yogurt. Low-sodium or reduced-sodium cheeses. Fats and Oils  Tub margarines without trans fats. Light or reduced-fat mayonnaise and salad dressings (reduced sodium). Avocado. Safflower, olive, or canola oils. Natural peanut or almond butter. Other  Unsalted popcorn and pretzels. The items listed above may not be a complete list of recommended foods or beverages. Contact your dietitian for more options.  What foods are not recommended? Grains  White bread. White pasta. White rice. Refined cornbread. Bagels and croissants. Crackers that contain trans fat. Vegetables  Creamed or fried vegetables. Vegetables in a cheese sauce. Regular canned vegetables. Regular canned tomato sauce and paste. Regular tomato and vegetable juices. Fruits  Canned fruit in light or heavy syrup. Fruit juice. Meat and Other Protein Products  Fatty cuts of meat. Ribs, chicken wings, bacon, sausage, bologna, salami, chitterlings, fatback, hot dogs, bratwurst, and packaged luncheon meats. Salted nuts and seeds. Canned beans with salt. Dairy  Whole or 2% milk, cream, half-and-half, and cream cheese. Whole-fat or sweetened yogurt. Full-fat cheeses   or blue cheese. Nondairy creamers and whipped toppings. Processed cheese, cheese spreads, or cheese curds. Condiments  Onion and garlic  salt, seasoned salt, table salt, and sea salt. Canned and packaged gravies. Worcestershire sauce. Tartar sauce. Barbecue sauce. Teriyaki sauce. Soy sauce, including reduced sodium. Steak sauce. Fish sauce. Oyster sauce. Cocktail sauce. Horseradish. Ketchup and mustard. Meat flavorings and tenderizers. Bouillon cubes. Hot sauce. Tabasco sauce. Marinades. Taco seasonings. Relishes. Fats and Oils  Butter, stick margarine, lard, shortening, ghee, and bacon fat. Coconut, palm kernel, or palm oils. Regular salad dressings. Other  Pickles and olives. Salted popcorn and pretzels. The items listed above may not be a complete list of foods and beverages to avoid. Contact your dietitian for more information.  Where can I find more information? National Heart, Lung, and Blood Institute: www.nhlbi.nih.gov/health/health-topics/topics/dash/ This information is not intended to replace advice given to you by your health care provider. Make sure you discuss any questions you have with your health care provider. Document Released: 06/27/2011 Document Revised: 12/14/2015 Document Reviewed: 05/12/2013 Elsevier Interactive Patient Education  2017 Elsevier Inc.  

## 2016-06-19 ENCOUNTER — Encounter (HOSPITAL_COMMUNITY): Payer: Managed Care, Other (non HMO) | Attending: Hematology & Oncology | Admitting: Hematology & Oncology

## 2016-06-19 VITALS — BP 151/95 | HR 99 | Temp 98.1°F | Resp 18 | Wt 186.8 lb

## 2016-06-19 DIAGNOSIS — Z170 Estrogen receptor positive status [ER+]: Principal | ICD-10-CM

## 2016-06-19 DIAGNOSIS — C50912 Malignant neoplasm of unspecified site of left female breast: Secondary | ICD-10-CM | POA: Diagnosis not present

## 2016-06-19 DIAGNOSIS — I10 Essential (primary) hypertension: Secondary | ICD-10-CM

## 2016-06-19 DIAGNOSIS — Z79899 Other long term (current) drug therapy: Secondary | ICD-10-CM

## 2016-06-19 DIAGNOSIS — E538 Deficiency of other specified B group vitamins: Secondary | ICD-10-CM

## 2016-06-19 DIAGNOSIS — M810 Age-related osteoporosis without current pathological fracture: Secondary | ICD-10-CM | POA: Diagnosis not present

## 2016-06-19 DIAGNOSIS — D72829 Elevated white blood cell count, unspecified: Secondary | ICD-10-CM

## 2016-06-19 DIAGNOSIS — C50512 Malignant neoplasm of lower-outer quadrant of left female breast: Secondary | ICD-10-CM

## 2016-06-19 DIAGNOSIS — D7589 Other specified diseases of blood and blood-forming organs: Secondary | ICD-10-CM

## 2016-06-19 DIAGNOSIS — Z79811 Long term (current) use of aromatase inhibitors: Secondary | ICD-10-CM

## 2016-06-19 MED ORDER — DIAZEPAM 5 MG PO TABS: 5.0000 mg | ORAL_TABLET | Freq: Four times a day (QID) | ORAL | 2 refills | Status: DC | PRN

## 2016-06-19 NOTE — Patient Instructions (Addendum)
[facility] at [facility] Discharge Instructions  RECOMMENDATIONS MADE BY THE CONSULTANT AND ANY TEST RESULTS WILL BE SENT TO YOUR REFERRING PHYSICIAN.  Exam with Dr. Whitney Muse today. Please see Amy for appointments.      Thank you for choosing [place] at [facility] to provide your oncology and hematology care.  To afford each patient quality time with our provider, please arrive at least 15 minutes before your scheduled appointment time.   Beginning January 23rd 2017 lab work for the [facility] will be done in the  Main lab at [facility] on 1st floor. If you have a lab appointment with the [facility] please come in thru the  Main Entrance and check in at the main information desk  You need to re-schedule your appointment should you arrive 10 or more minutes late.  We strive to give you quality time with our providers, and arriving late affects you and other patients whose appointments are after yours.  Also, if you no show three or more times for appointments you may be dismissed from the clinic at the providers discretion.     Again, thank you for choosing [facility]  Our hope is that these requests will decrease the amount of time that you wait before being seen by our physicians.       _____________________________________________________________  Should you have questions after your visit to [facility], please contact our office at [phone] between the hours of 8:30 a.m. and 4:30 p.m.  Voicemails left after 4:30 p.m. will not be returned until the following business day.  For prescription refill requests, have your pharmacy contact our office.         Resources For Cancer Patients and their Caregivers ? American Cancer Society: Can assist with transportation, wigs, general needs, runs Look Good Feel Better.        (779) 566-7247 ? Cancer Care: Provides financial assistance, online support  groups, medication/co-pay assistance.  1-800-813-HOPE (334)732-9333) ? [facility] Assists [place] cancer patients and their families through emotional , educational and financial support.  431-417-7032 ? [place] DSS Where to apply for food stamps, Medicaid and utility assistance. 240-878-1249 ? [facility]: Transportation to medical appointments. (787) 127-2388 ? Social Security Administration: May apply for disability if have a Stage IV cancer. 475-245-8871 559-243-8799 ? [employer], Disability and Transit Services: Assists with nutrition, care and transit needs. [facility] Support Programs: @10RELATIVEDAYS @ > Cancer Support Group  2nd Tuesday of the month 1pm-2pm, Journey Room  > Creative Journey  3rd Tuesday of the month 1130am-1pm, Journey Room  > Look Good Feel Better  1st Wednesday of the month 10am-12 noon, Journey Room (Call [place] to register [phone])

## 2016-06-19 NOTE — Progress Notes (Signed)
Beth Lange, MD Beth Hamilton [id number]  PROGRESS NOTE  DIAGNOSIS: Breast cancer of lower-outer quadrant of left female breast   Staging form: Breast, AJCC 7th Edition     Clinical: No stage assigned - Unsigned  Stage I (pT1b pNOMx) infiltrating ductal carcinoma of the left breast, status post lumpectomy and axillary sentinel node sampling, ER/PR positive, HER-2/neu not overexpressed, Oncotype DX score of 7, adjuvant XRT. On Arimidex.  Biopsy was performed on 04/05/2014 at which time a combination of infiltrating ductal carcinoma and noninvasive ductal neoplasia were found, ER 100%, PR 100%  Ulcerative colitis, controlled.  DEXA 05/2014 with osteoporosis COLONOSCOPY 04/18/2014  SUMMARY OF ONCOLOGIC HISTORY:   Breast cancer of lower-outer quadrant of left female breast (Beth Hamilton)   05/24/2014 Initial Diagnosis    Breast cancer of lower-outer quadrant of left female breast      06/22/2014 - 08/05/2014 Radiation Therapy    Left breast 50 Gy at 2 Gy per fraction x 25 fractions with left breast boost 10 Gy at 2 Gy per fraction x 5 fractions by Beth. Pablo Hamilton.       CURRENT THERAPY: Arimidex  INTERVAL HISTORY: Beth Hamilton 60 y.o. female returns for follow-up of a stage I Left breast cancer. She is currently on Arimidex. DEXA scan performed in November 2015 showed osteoporosis. She is due for repeat DEXA and plans on doing this closer to the Christmas holiday. She is on prolia having received 2 injections although given 12 months apart.   She is here today unaccompanied. I have reviewed the labs with the patient.   Her next Prolia is on January 29. She would like to know if she can push it until she comes in for a follow up.  She goes and sees Beth. Wolfgang Hamilton on May 17 at 8:10am and will get blood work done then.   She says she feels good today.   Her appetite is "too good".   She is up to date on her mammogram and her pap smear.   She denies any other  complaints today.  Energy is unchanged. She works full time. She does all of her ADL's and more without difficulty.  MEDICAL HISTORY: Past Medical History:  Diagnosis Date  . Breast cancer ([facility] [place]) 04/20/14   left breast  . Complication of anesthesia    not sure what med was given in 96 with D&C, was very sore all over. Beth Hamilton did anesthesia. Beth Hamilton did surgery  . Hemorrhoids   . History of breast cancer 05/19/2015  . Hypertension   . Hypothyroidism   . IBD (inflammatory bowel disease)    UC  . Pneumonia   . Psoriasis   . Thyroid disease   . Ulcerative colitis ([place]) 5 1 2007   Beth Hamilton    has HEMORRHOIDS; UC (ulcerative colitis) ([place]); DIARRHEA; ABDOMINAL CRAMPS; HTN (hypertension), benign; Breast cancer of lower-outer quadrant of left female breast ([place]); Hypothyroidism; Osteoporosis; History of breast cancer; Hyperlipidemia; and FUO (fever of unknown origin) on her problem list.     is allergic to flagyl [metronidazole]; other; and levaquin [levofloxacin in d5w].  Beth Hamilton had no medications administered during this visit.  SURGICAL HISTORY: Past Surgical History:  Procedure Laterality Date  . COLONOSCOPY  10/09/2001   RMR: Internal hemorrhoids and anal papilla; otherwise normal rectum  . COLONOSCOPY  12/10/2005   incomplete tcs-  colitis  . COLONOSCOPY N/A 04/18/2014   Beth Hamilton- abnormal rectum & L colon  into the mid descending segment. mucosa was erythematious as well as pale w/ some loss of normal vascular apttern. no erosions or ulcers. pt had some smooth peduculated 3-4cm polyps in the sigmoid segment most c/w pseudopolyps. the rest of the colonic mucosa appeared normal. bx= inflammatory polyps  . DILATION AND CURETTAGE OF UTERUS  1996   Beth Hamilton  . FLEXIBLE SIGMOIDOSCOPY  02/26/2006   endoscopically normal-appearing rectum, colitis in sigmoid mucosa  . PARTIAL MASTECTOMY WITH NEEDLE LOCALIZATION AND AXILLARY SENTINEL LYMPH NODE BX Left 04/20/2014   Procedure:  PARTIAL MASTECTOMY AFTER NEEDLE LOCALIZATION AND AXILLARY SENTINEL LYMPH NODE BX;  Surgeon: Beth So, MD;  Location: [facility];  Service: General;  Laterality: Left;  . TUBAL LIGATION    . WISDOM TOOTH EXTRACTION      SOCIAL HISTORY: Social History   Social History  . Marital status: Married    Spouse name: N/A  . Number of children: N/A  . Years of education: N/A   Occupational History  . Not on file.   Social History Main Topics  . Smoking status: Former Smoker    Packs/day: 0.00    Years: 15.00    Types: Cigarettes    Quit date: 07/26/2005  . Smokeless tobacco: Never Used     Comment: quit in 2007  . Alcohol use Yes     Comment: wine every day  . Drug use: No  . Sexual activity: Yes    Birth control/ protection: Post-menopausal   Other Topics Concern  . Not on file   Social History Narrative  . No narrative on file    FAMILY HISTORY: Family History  Problem Relation Age of Onset  . Stroke Maternal Grandfather   . Heart attack Maternal Grandfather   . Hypertension Mother   . Diabetes Paternal Grandfather   . Heart disease Paternal Grandfather   . Colon cancer Neg Hx     Review of Systems  Constitutional: Negative.        Good appetite.   HENT: Negative.   Eyes: Negative.   Respiratory: Negative.   Cardiovascular: Negative.   Gastrointestinal: Negative.   Genitourinary: Negative.   Musculoskeletal: Negative.   Skin: Negative.   Neurological: Negative.   Endo/Heme/Allergies: Negative.   Psychiatric/Behavioral: Negative.   All other systems reviewed and are negative. 14 point review of systems was performed and is negative except as detailed under history of present illness and above   PHYSICAL EXAMINATION  ECOG PERFORMANCE STATUS: 0 - Asymptomatic  BP (!) 151/95 (BP Location: Right Arm, Patient Position: Sitting)   Pulse 99   Temp 98.1 F (36.7 C) (Oral)   Resp 18   Wt 186 lb 12.8 oz (84.7 kg)   SpO2 100%   BMI 32.06 kg/m    Physical  Exam  Constitutional: She is oriented to person, place, and time and well-developed, well-nourished, and in no distress.  Pt was able to get on exam table without assistance.   HENT:  Head: Normocephalic and atraumatic.  Nose: Nose normal.  Mouth/Throat: Oropharynx is clear and moist. No oropharyngeal exudate.  Eyes: Conjunctivae and EOM are normal. Pupils are equal, round, and reactive to light. Right eye exhibits no discharge. Left eye exhibits no discharge. No scleral icterus.  Neck: Normal range of motion. Neck supple. No tracheal deviation present. No thyromegaly present.  Cardiovascular: Normal rate, regular rhythm and normal heart sounds.  Exam reveals no gallop and no friction rub.   No murmur heard. Pulmonary/Chest: Effort normal  and breath sounds normal. She has no wheezes. She has no rales.  Abdominal: Soft. Bowel sounds are normal. She exhibits no distension and no mass. There is no tenderness. There is no rebound and no guarding.  Musculoskeletal: Normal range of motion. She exhibits no edema.  Lymphadenopathy:    She has no cervical adenopathy.  Neurological: She is alert and oriented to person, place, and time. She has normal reflexes. No cranial nerve deficit. Gait normal. Coordination normal.  Skin: Skin is warm and dry. No rash noted.  Psychiatric: Mood, memory, affect and judgment normal.  Nursing note and vitals reviewed.   LABORATORY DATA: I have reviewed the data as listed.  CBC    Component Value Date/Time   WBC 6.5 08/21/2015 0959   RBC 4.44 08/21/2015 0959   HGB 14.7 08/21/2015 0959   HCT 43.6 08/21/2015 0959   HCT 34.9 04/26/2015 0815   PLT 252 08/21/2015 0959   PLT 520 (H) 04/26/2015 0815   MCV 98.2 08/21/2015 0959   MCV 95 04/26/2015 0815   MCH 33.1 08/21/2015 0959   MCHC 33.7 08/21/2015 0959   RDW 14.5 08/21/2015 0959   RDW 12.6 04/26/2015 0815   LYMPHSABS 1.4 08/21/2015 0959   LYMPHSABS 1.0 04/26/2015 0815   MONOABS 0.5 08/21/2015 0959    EOSABS 0.1 08/21/2015 0959   EOSABS 0.2 04/26/2015 0815   BASOSABS 0.0 08/21/2015 0959   BASOSABS 0.0 04/26/2015 0815   CMP     Component Value Date/Time   NA 138 02/15/2016 1317   NA 142 01/13/2016 0847   K 3.9 02/15/2016 1317   CL 101 02/15/2016 1317   CO2 30 02/15/2016 1317   GLUCOSE 140 (H) 02/15/2016 1317   BUN 16 02/15/2016 1317   BUN 19 01/13/2016 0847   CREATININE 0.79 02/15/2016 1317   CREATININE 0.69 11/20/2013 1004   CALCIUM 9.3 02/15/2016 1317   PROT 7.8 02/15/2016 1317   ALBUMIN 4.2 02/15/2016 1317   AST 33 02/15/2016 1317   ALT 21 02/15/2016 1317   ALKPHOS 89 02/15/2016 1317   BILITOT 0.9 02/15/2016 1317   GFRNONAA >60 02/15/2016 1317   GFRAA >60 02/15/2016 1317   ASSESSMENT and THERAPY PLAN:  Stage I ER positive, PR positive, HER-2 negative carcinoma of the left breast. Osteoporosis, on prolia Macrocytosis Low B12 Low grade fever, markedly elevated CRP 124 mg/L, history Ulcerative colitis HTN Leukocytosis on prior prednisone Candida, B inframamary folds  She will currently continue on Arimidex. She has excellent tolerance. The goal is for 5 years of therapy. We will consider doing a breast cancer index to see if she will benefit from longer duration therapy as she gets closer to her discontinuation date. Her bone density will have to be closely monitored. Next bone density was due in November, she will do it around the Christmas Holiday. She is on prolia but injections have been given 12 months apart, she wishes to delay her next injection to her 6 month folow-up, hopefully then we can keep her on a 6 month injection schedule.   She has been taking calcium and vitamin D.   We may need to consider monitoring a urine NTX. I have addressed this with her.   She is to continue on folic acid and P80.   She is up to date on mammography. She has had her pap smear.   RTC in 6 months.   NCCN guidelines recommends the following surveillance for invasive breast  cancer:  A. History and Physical exam  every 4-6 months for 5 years and then every 12 months.  B. Mammography every 12 months  C. Women on Tamoxifen: annual gynecologic assessment every 12 months if uterus is present.  D. Women on aromatase inhibitor or who experience ovarian failure secondary to treatment should have monitoring of bone health with a bone mineral density determination at baseline and periodically thereafter.  E. Assess and encourage adherence to adjuvant endocrine therapy.  F. Evidence suggests that active lifestyle and achieving and maintaining an ideal body weight (20-25 BMI) may lead to optimal breast cancer outcomes.  In a two-year randomized trial of denosumab (60 mg subcutaneously every six months) versus placebo in 250 postmenopausal osteopenic (mean T-scores -0.88 to -1.33) women receiving adjuvant AI therapy, subjects in the denosumab group had significant increases in LS, TH, and femoral neck BMD compared with placebo (between group differences of 7.6, 4.7, and 3.6 percent, respectively).There were no vertebral fractures, and nonvertebral fractures occurred in eight subjects in each group. The most common side effects included arthralgia, back pain, and fatigue.  Orders Placed This Encounter  Procedures  . DG Bone Density    Standing Status:   Future    Standing Expiration Date:   06/19/2017    Order Specific Question:   Reason for Exam (SYMPTOM  OR DIAGNOSIS REQUIRED)    Answer:   high risk medication    Order Specific Question:   Is the patient pregnant?    Answer:   No    Order Specific Question:   Preferred imaging location?    Answer:   [facility]   Meds ordered this encounter  Medications  . diazepam (VALIUM) 5 MG tablet    Sig: Take 1 tablet (5 mg total) by mouth every 6 (six) hours as needed for anxiety.    Dispense:  60 tablet    Refill:  2     All questions were answered. The patient knows to call the clinic with any problems, questions or  concerns. We can certainly see the patient much sooner if necessary.   This document serves as a record of services personally performed by Ancil Linsey, MD. It was created on her behalf by Martinique Casey, a trained medical scribe. The creation of this record is based on the scribe's personal observations and the provider's statements to them. This document has been checked and approved by the attending provider.  I have reviewed the above documentation for accuracy and completeness, and I agree with the above.  This note was electronically signed.  Kelby Fam. Eva Griffo, MD 02/15/2016

## 2016-06-23 ENCOUNTER — Encounter (HOSPITAL_COMMUNITY): Payer: Self-pay | Admitting: Hematology & Oncology

## 2016-06-25 ENCOUNTER — Other Ambulatory Visit: Payer: Self-pay | Admitting: Obstetrics and Gynecology

## 2016-07-05 ENCOUNTER — Ambulatory Visit (HOSPITAL_COMMUNITY)
Admission: RE | Admit: 2016-07-05 | Discharge: 2016-07-05 | Disposition: A | Discharge status: Home / Self Care | Payer: Managed Care, Other (non HMO) | Source: Ambulatory Visit | Attending: Hematology & Oncology | Admitting: Hematology & Oncology

## 2016-07-05 DIAGNOSIS — M8589 Other specified disorders of bone density and structure, multiple sites: Secondary | ICD-10-CM | POA: Diagnosis not present

## 2016-07-05 DIAGNOSIS — Z170 Estrogen receptor positive status [ER+]: Secondary | ICD-10-CM | POA: Insufficient documentation

## 2016-07-05 DIAGNOSIS — C50512 Malignant neoplasm of lower-outer quadrant of left female breast: Secondary | ICD-10-CM | POA: Insufficient documentation

## 2016-07-05 DIAGNOSIS — Z79899 Other long term (current) drug therapy: Secondary | ICD-10-CM | POA: Diagnosis present

## 2016-07-09 ENCOUNTER — Telehealth: Payer: Self-pay | Admitting: Adult Health

## 2016-07-09 MED ORDER — ACYCLOVIR 400 MG PO TABS: ORAL_TABLET | ORAL | 11 refills | Status: DC

## 2016-07-09 MED ORDER — ACYCLOVIR 5 % EX OINT: 1.0000 "application " | TOPICAL_OINTMENT | CUTANEOUS | 3 refills | Status: DC | PRN

## 2016-07-09 NOTE — Telephone Encounter (Signed)
Pt has recurrent HSV I oral lesions, in addition to psoriasis, and chronic dry skin. She uses oral Acyclovir and topical ointment when she feels an outbreak developing.

## 2016-07-16 ENCOUNTER — Telehealth (HOSPITAL_COMMUNITY): Payer: Self-pay

## 2016-07-16 NOTE — Telephone Encounter (Signed)
After reviewing with PA, notified patient that her bone density test was better. Her bones are getting stronger. The prolia is working and she should continue as scheduled. Patient verbalized understanding.

## 2016-07-16 NOTE — Telephone Encounter (Signed)
-----   Message from Louis Meckel sent at 07/16/2016 10:07 AM EST ----- Regarding: results Patient called and would like to be called about her bone density test  Thanks

## 2016-08-19 ENCOUNTER — Ambulatory Visit (HOSPITAL_COMMUNITY): Payer: Managed Care, Other (non HMO)

## 2016-08-19 ENCOUNTER — Other Ambulatory Visit (HOSPITAL_COMMUNITY): Payer: Managed Care, Other (non HMO)

## 2016-12-05 ENCOUNTER — Ambulatory Visit (INDEPENDENT_AMBULATORY_CARE_PROVIDER_SITE_OTHER): Payer: Managed Care, Other (non HMO) | Admitting: Family Medicine

## 2016-12-05 ENCOUNTER — Encounter: Payer: Self-pay | Admitting: Family Medicine

## 2016-12-05 VITALS — BP 126/82 | Ht 64.0 in | Wt 186.8 lb

## 2016-12-05 DIAGNOSIS — E784 Other hyperlipidemia: Secondary | ICD-10-CM | POA: Diagnosis not present

## 2016-12-05 DIAGNOSIS — R7301 Impaired fasting glucose: Secondary | ICD-10-CM

## 2016-12-05 DIAGNOSIS — I10 Essential (primary) hypertension: Secondary | ICD-10-CM | POA: Diagnosis not present

## 2016-12-05 DIAGNOSIS — E7849 Other hyperlipidemia: Secondary | ICD-10-CM

## 2016-12-05 DIAGNOSIS — Z1159 Encounter for screening for other viral diseases: Secondary | ICD-10-CM

## 2016-12-05 DIAGNOSIS — Z114 Encounter for screening for human immunodeficiency virus [HIV]: Secondary | ICD-10-CM

## 2016-12-05 MED ORDER — LISINOPRIL 10 MG PO TABS: 10.0000 mg | ORAL_TABLET | Freq: Every day | ORAL | 6 refills | Status: DC

## 2016-12-05 NOTE — Progress Notes (Signed)
   Subjective:    Patient ID: Beth Hamilton, female    DOB: May 07, 1956, 61 y.o.   MRN: [id number]  Hypertension  This is a chronic problem. The current episode started more than 1 year ago. Risk factors for coronary artery disease include post-menopausal state. Treatments tried: lisinopril. There are no compliance problems.    She does take her medicine on a regular basis watches her diet able to do some walking she sees a specialist for her thyroid. She also see a specialist for follow-up of breast cancer.    Review of Systems Denies chest tightness pressure pain shortness breath nausea vomiting diarrhea    Objective:   Physical Exam Lungs clear heart regular pulse normal repeat good mild obesity       Assessment & Plan:  Patient's blood pressure very good continue current measures follow-up 6 months  Thyroid issue followed by specialist  Ulcerative colitis followed by specialist  Breast cancer history followed by specialist  Patient is due for blood work to look at kidney function A1c because of fasting hyperglycemia also screening labs for hep C and HIV plus also due for lipid because of hyperlipidemia

## 2016-12-17 ENCOUNTER — Other Ambulatory Visit (HOSPITAL_COMMUNITY): Payer: Self-pay | Admitting: *Deleted

## 2016-12-17 DIAGNOSIS — C50512 Malignant neoplasm of lower-outer quadrant of left female breast: Secondary | ICD-10-CM

## 2016-12-18 ENCOUNTER — Other Ambulatory Visit (HOSPITAL_COMMUNITY): Payer: Managed Care, Other (non HMO)

## 2016-12-18 ENCOUNTER — Encounter (HOSPITAL_COMMUNITY): Payer: Managed Care, Other (non HMO)

## 2016-12-18 ENCOUNTER — Encounter (HOSPITAL_BASED_OUTPATIENT_CLINIC_OR_DEPARTMENT_OTHER): Payer: Managed Care, Other (non HMO) | Admitting: Oncology

## 2016-12-18 ENCOUNTER — Encounter (HOSPITAL_COMMUNITY): Payer: Self-pay

## 2016-12-18 ENCOUNTER — Ambulatory Visit (HOSPITAL_COMMUNITY): Payer: Managed Care, Other (non HMO)

## 2016-12-18 ENCOUNTER — Encounter (HOSPITAL_COMMUNITY): Payer: Managed Care, Other (non HMO) | Attending: Oncology

## 2016-12-18 VITALS — BP 158/93 | HR 114 | Temp 98.6°F | Resp 16 | Wt 187.3 lb

## 2016-12-18 DIAGNOSIS — C50512 Malignant neoplasm of lower-outer quadrant of left female breast: Secondary | ICD-10-CM

## 2016-12-18 DIAGNOSIS — Z79811 Long term (current) use of aromatase inhibitors: Secondary | ICD-10-CM

## 2016-12-18 DIAGNOSIS — M810 Age-related osteoporosis without current pathological fracture: Secondary | ICD-10-CM

## 2016-12-18 DIAGNOSIS — Z170 Estrogen receptor positive status [ER+]: Secondary | ICD-10-CM | POA: Insufficient documentation

## 2016-12-18 LAB — COMPREHENSIVE METABOLIC PANEL
ALT: 19 U/L (ref 14–54)
AST: 28 U/L (ref 15–41)
Albumin: 4.4 g/dL (ref 3.5–5.0)
Alkaline Phosphatase: 72 U/L (ref 38–126)
Anion gap: 10 (ref 5–15)
BILIRUBIN TOTAL: 0.9 mg/dL (ref 0.3–1.2)
BUN: 20 mg/dL (ref 6–20)
CO2: 29 mmol/L (ref 22–32)
CREATININE: 0.88 mg/dL (ref 0.44–1.00)
Calcium: 9.6 mg/dL (ref 8.9–10.3)
Chloride: 99 mmol/L — ABNORMAL LOW (ref 101–111)
Glucose, Bld: 140 mg/dL — ABNORMAL HIGH (ref 65–99)
Potassium: 4 mmol/L (ref 3.5–5.1)
Sodium: 138 mmol/L (ref 135–145)
TOTAL PROTEIN: 7.7 g/dL (ref 6.5–8.1)

## 2016-12-18 MED ORDER — DENOSUMAB 60 MG/ML ~~LOC~~ SOLN
60.0000 mg | Freq: Once | SUBCUTANEOUS | Status: AC
  Administered 2016-12-18: 60 mg via SUBCUTANEOUS
  Filled 2016-12-18: qty 1

## 2016-12-18 MED ORDER — DENOSUMAB 60 MG/ML ~~LOC~~ SOLN: 60.0000 mg | Freq: Once | SUBCUTANEOUS | Status: DC

## 2016-12-18 NOTE — Progress Notes (Signed)
Beth Hamilton presents today for injection per the provider's orders.  Prolia administration without incident; see MAR for injection details.  Patient tolerated procedure well and without incident.  No questions or complaints noted at this time.  Discharged ambulatory.

## 2016-12-18 NOTE — Progress Notes (Signed)
Beth Drown, MD [place] [address]  PROGRESS NOTE  DIAGNOSIS: Breast cancer of lower-outer quadrant of left female breast   Staging form: Breast, AJCC 7th Edition     Clinical: No stage assigned - Unsigned  Stage I (pT1b pNOMx) infiltrating ductal carcinoma of the left breast, status post lumpectomy and axillary sentinel node sampling, ER/PR positive, HER-2/neu not overexpressed, Oncotype DX score of 7, adjuvant XRT. On Arimidex.  Biopsy was performed on 04/05/2014 at which time a combination of infiltrating ductal carcinoma and noninvasive ductal neoplasia were found, ER 100%, PR 100%  DEXA 05/2014 with osteoporosis   SUMMARY OF ONCOLOGIC HISTORY:   Breast cancer of lower-outer quadrant of left female breast ([facility])   05/24/2014 Initial Diagnosis    Breast cancer of lower-outer quadrant of left female breast      06/22/2014 - 08/05/2014 Radiation Therapy    Left breast 50 Gy at 2 Gy per fraction x 25 fractions with left breast boost 10 Gy at 2 Gy per fraction x 5 fractions by Dr. Pablo Ledger.       CURRENT THERAPY: Arimidex  INTERVAL HISTORY: Beth Hamilton 61 y.o. female returns for follow-up of a stage I Left breast cancer. She is currently on Arimidex. DEXA scan performed in November 2015 showed osteoporosis.   Today she presents for follow up. She states she is doing well. She continues to take arimidex without any side effects. She is on calcium-vitamin D and is due for her prolia today. She had a repeat DEXA scan in December 2017 which demonstrated improvement in her osteoporosis to osteopenia. She denies feeling any breast masses, nipple discharge, or skin changes on her breasts. Her overall health has been good and she has not had any new health issues or new medications.   MEDICAL HISTORY: Past Medical History:  Diagnosis Date  . Breast cancer ([employer]) 04/20/14   left breast  . Complication of anesthesia    not sure what med was given in  96 with D&C, was very sore all over. Dr Patsey Berthold did anesthesia. Dr Heide Spark did surgery  . Hemorrhoids   . History of breast cancer 05/19/2015  . Hypertension   . Hypothyroidism   . IBD (inflammatory bowel disease)    UC  . Pneumonia   . Psoriasis   . Thyroid disease   . Ulcerative colitis ([place]) 5 1 2007   Dr Gala Romney    has HEMORRHOIDS; UC (ulcerative colitis) ([place]); DIARRHEA; ABDOMINAL CRAMPS; HTN (hypertension), benign; Breast cancer of lower-outer quadrant of left female breast ([facility]); Hypothyroidism; Osteoporosis; History of breast cancer; Hyperlipidemia; and FUO (fever of unknown origin) on her problem list.     is allergic to flagyl [metronidazole]; other; and levaquin [levofloxacin in d5w].  Ms. Deike had no medications administered during this visit.  SURGICAL HISTORY: Past Surgical History:  Procedure Laterality Date  . COLONOSCOPY  10/09/2001   RMR: Internal hemorrhoids and anal papilla; otherwise normal rectum  . COLONOSCOPY  12/10/2005   incomplete tcs-  colitis  . COLONOSCOPY N/A 04/18/2014   Dr.Rourk- abnormal rectum & L colon into the mid descending segment. mucosa was erythematious as well as pale w/ some loss of normal vascular apttern. no erosions or ulcers. pt had some smooth peduculated 3-4cm polyps in the sigmoid segment most c/w pseudopolyps. the rest of the colonic mucosa appeared normal. bx= inflammatory polyps  . DILATION AND CURETTAGE OF UTERUS  1996   Dr Heide Spark  . FLEXIBLE  SIGMOIDOSCOPY  02/26/2006   endoscopically normal-appearing rectum, colitis in sigmoid mucosa  . PARTIAL MASTECTOMY WITH NEEDLE LOCALIZATION AND AXILLARY SENTINEL LYMPH NODE BX Left 04/20/2014   Procedure: PARTIAL MASTECTOMY AFTER NEEDLE LOCALIZATION AND AXILLARY SENTINEL LYMPH NODE BX;  Surgeon: Jamesetta So, MD;  Location: [facility];  Service: General;  Laterality: Left;  . TUBAL LIGATION    . WISDOM TOOTH EXTRACTION      SOCIAL HISTORY: Social History   Social History  . Marital  status: Married    Spouse name: N/A  . Number of children: N/A  . Years of education: N/A   Occupational History  . Not on file.   Social History Main Topics  . Smoking status: Former Smoker    Packs/day: 0.00    Years: 15.00    Types: Cigarettes    Quit date: 07/26/2005  . Smokeless tobacco: Never Used     Comment: quit in 2007  . Alcohol use Yes     Comment: wine every day  . Drug use: No  . Sexual activity: Yes    Birth control/ protection: Post-menopausal   Other Topics Concern  . Not on file   Social History Narrative  . No narrative on file    FAMILY HISTORY: Family History  Problem Relation Age of Onset  . Stroke Maternal Grandfather   . Heart attack Maternal Grandfather   . Hypertension Mother   . Diabetes Paternal Grandfather   . Heart disease Paternal Grandfather   . Colon cancer Neg Hx     Review of Systems  Constitutional: Negative.        Good appetite.   HENT: Negative.   Eyes: Negative.   Respiratory: Negative.   Cardiovascular: Negative.   Gastrointestinal: Negative.   Genitourinary: Negative.   Musculoskeletal: Negative.   Skin: Negative.   Neurological: Negative.   Endo/Heme/Allergies: Negative.   Psychiatric/Behavioral: Negative.   All other systems reviewed and are negative. 14 point review of systems was performed and is negative except as detailed under history of present illness and above   PHYSICAL EXAMINATION  ECOG PERFORMANCE STATUS: 0 - Asymptomatic  BP (!) 158/93 (BP Location: Right Arm, Patient Position: Sitting)   Pulse (!) 114   Temp 98.6 F (37 C) (Oral)   Resp 16   Wt 187 lb 4.8 oz (85 kg)   SpO2 99%   BMI 32.15 kg/m    Physical Exam  Constitutional: She is oriented to person, place, and time and well-developed, well-nourished, and in no distress.  Pt was able to get on exam table without assistance.   HENT:  Head: Normocephalic and atraumatic.  Nose: Nose normal.  Mouth/Throat: Oropharynx is clear and moist.  No oropharyngeal exudate.  Eyes: Conjunctivae and EOM are normal. Pupils are equal, round, and reactive to light. Right eye exhibits no discharge. Left eye exhibits no discharge. No scleral icterus.  Neck: Normal range of motion. Neck supple. No tracheal deviation present. No thyromegaly present.  Cardiovascular: Normal rate, regular rhythm and normal heart sounds.  Exam reveals no gallop and no friction rub.   No murmur heard. Pulmonary/Chest: Effort normal and breath sounds normal. She has no wheezes. She has no rales. Right breast exhibits no inverted nipple, no mass, no nipple discharge, no skin change and no tenderness. Left breast exhibits no inverted nipple, no mass, no nipple discharge, no skin change and no tenderness.  Abdominal: Soft. Bowel sounds are normal. She exhibits no distension and no mass. There is no  tenderness. There is no rebound and no guarding.  Musculoskeletal: Normal range of motion. She exhibits no edema.  Lymphadenopathy:    She has no cervical adenopathy.  Neurological: She is alert and oriented to person, place, and time. She has normal reflexes. No cranial nerve deficit. Gait normal. Coordination normal.  Skin: Skin is warm and dry. No rash noted.  Psychiatric: Mood, memory, affect and judgment normal.  Nursing note and vitals reviewed.   LABORATORY DATA: I have reviewed the data as listed.  CBC    Component Value Date/Time   WBC 6.5 08/21/2015 0959   RBC 4.44 08/21/2015 0959   HGB 14.7 08/21/2015 0959   HCT 43.6 08/21/2015 0959   HCT 34.9 04/26/2015 0815   PLT 252 08/21/2015 0959   PLT 520 (H) 04/26/2015 0815   MCV 98.2 08/21/2015 0959   MCV 95 04/26/2015 0815   MCH 33.1 08/21/2015 0959   MCHC 33.7 08/21/2015 0959   RDW 14.5 08/21/2015 0959   RDW 12.6 04/26/2015 0815   LYMPHSABS 1.4 08/21/2015 0959   LYMPHSABS 1.0 04/26/2015 0815   MONOABS 0.5 08/21/2015 0959   EOSABS 0.1 08/21/2015 0959   EOSABS 0.2 04/26/2015 0815   BASOSABS 0.0 08/21/2015  0959   BASOSABS 0.0 04/26/2015 0815   CMP     Component Value Date/Time   NA 138 12/18/2016 1316   NA 142 01/13/2016 0847   K 4.0 12/18/2016 1316   CL 99 (L) 12/18/2016 1316   CO2 29 12/18/2016 1316   GLUCOSE 140 (H) 12/18/2016 1316   BUN 20 12/18/2016 1316   BUN 19 01/13/2016 0847   CREATININE 0.88 12/18/2016 1316   CREATININE 0.69 11/20/2013 1004   CALCIUM 9.6 12/18/2016 1316   PROT 7.7 12/18/2016 1316   ALBUMIN 4.4 12/18/2016 1316   AST 28 12/18/2016 1316   ALT 19 12/18/2016 1316   ALKPHOS 72 12/18/2016 1316   BILITOT 0.9 12/18/2016 1316   GFRNONAA >60 12/18/2016 1316   GFRAA >60 12/18/2016 1316   DEXA (07/05/16) ASSESSMENT: BMD as determined from [facility] L2-L4 is 0.915 g/cm2 with a T-Score of -2.4. This patient is considered osteopenic according to [facility]) criteria. Compared with the prior study on 06/07/2014, the BMD of the lumbar spine shows a statistically significant increase. Compared with the prior study on 06/07/2014, the BMD of the left femoral neck shows no statistically significant change. (L-1 was excluded due to advanced degenerative changes.) (Patient is not a candidate for FRAX assessment due to history of recent Prolia injections.)  ASSESSMENT and THERAPY PLAN:  Stage I ER positive, PR positive, HER-2 negative carcinoma of the left breast. Osteoporosis, on prolia  Clinically NED on breast exam today.  Continue arimidex and calcium and vitamin D. Continue prolia q4month. Reviewed her DEXA scan from 06/2016 with the patient. Her osteoporosis has improved osteopenia.  She is up to date on mammography. Next one is due in 03/2017.   RTC in 6 months.   NCCN guidelines recommends the following surveillance for invasive breast cancer:  A. History and Physical exam every 4-6 months for 5 years and then every 12 months.  B. Mammography every 12 months  C. Women on Tamoxifen: annual gynecologic assessment every 12 months if uterus is  present.  D. Women on aromatase inhibitor or who experience ovarian failure secondary to treatment should have monitoring of bone health with a bone mineral density determination at baseline and periodically thereafter.  E. Assess and encourage adherence to adjuvant endocrine  therapy.  F. Evidence suggests that active lifestyle and achieving and maintaining an ideal body weight (20-25 BMI) may lead to optimal breast cancer outcomes.  In a two-year randomized trial of denosumab (60 mg subcutaneously every six months) versus placebo in 250 postmenopausal osteopenic (mean T-scores -0.88 to -1.33) women receiving adjuvant AI therapy, subjects in the denosumab group had significant increases in LS, TH, and femoral neck BMD compared with placebo (between group differences of 7.6, 4.7, and 3.6 percent, respectively).There were no vertebral fractures, and nonvertebral fractures occurred in eight subjects in each group. The most common side effects included arthralgia, back pain, and fatigue.    All questions were answered. The patient knows to call the clinic with any problems, questions or concerns. We can certainly see the patient much sooner if necessary.   This document serves as a record of services personally performed by Ancil Linsey, MD. It was created on her behalf by Martinique Casey, a trained medical scribe. The creation of this record is based on the scribe's personal observations and the provider's statements to them. This document has been checked and approved by the attending provider.  I have reviewed the above documentation for accuracy and completeness, and I agree with the above.  This note was electronically signed.  Kelby Fam. Penland, MD 02/15/2016

## 2016-12-18 NOTE — Patient Instructions (Addendum)
[place] at [facility] Discharge Instructions  RECOMMENDATIONS MADE BY THE CONSULTANT AND ANY TEST RESULTS WILL BE SENT TO YOUR REFERRING PHYSICIAN.  You were seen today by Dr. Twana First Follow up in 6 months with lab work   Thank you for choosing [place] at [facility] to provide your oncology and hematology care.  To afford each patient quality time with our provider, please arrive at least 15 minutes before your scheduled appointment time.    If you have a lab appointment with the [facility] please come in thru the  Main Entrance and check in at the main information desk  You need to re-schedule your appointment should you arrive 10 or more minutes late.  We strive to give you quality time with our providers, and arriving late affects you and other patients whose appointments are after yours.  Also, if you no show three or more times for appointments you may be dismissed from the clinic at the providers discretion.     Again, thank you for choosing [facility].  Our hope is that these requests will decrease the amount of time that you wait before being seen by our physicians.       _____________________________________________________________  Should you have questions after your visit to [facility], please contact our office at [phone] between the hours of 8:30 a.m. and 4:30 p.m.  Voicemails left after 4:30 p.m. will not be returned until the following business day.  For prescription refill requests, have your pharmacy contact our office.       Resources For Cancer Patients and their Caregivers ? American Cancer Society: Can assist with transportation, wigs, general needs, runs Look Good Feel Better.        540 408 2355 ? Cancer Care: Provides financial assistance, online support groups, medication/co-pay assistance.  1-800-813-HOPE 838 031 0719) ? [facility] Assists  [place] cancer patients and their families through emotional , educational and financial support.  (250) 465-6191 ? [place] DSS Where to apply for food stamps, Medicaid and utility assistance. 281-597-7642 ? [facility]: Transportation to medical appointments. 773-248-1549 ? Social Security Administration: May apply for disability if have a Stage IV cancer. 867-055-8709 303-106-7085 ? [employer], Disability and Transit Services: Assists with nutrition, care and transit needs. [place] Support Programs: @10RELATIVEDAYS @ > Cancer Support Group  2nd Tuesday of the month 1pm-2pm, Journey Room  > Creative Journey  3rd Tuesday of the month 1130am-1pm, Journey Room  > Look Good Feel Better  1st Wednesday of the month 10am-12 noon, Journey Room (Call [place] to register [phone])

## 2016-12-23 LAB — HEMOGLOBIN A1C
ESTIMATED AVERAGE GLUCOSE: 114 mg/dL
Hgb A1c MFr Bld: 5.6 % (ref 4.8–5.6)

## 2016-12-23 LAB — HEPATITIS C ANTIBODY: Hep C Virus Ab: <0.1 s/co ratio (ref 0.0–0.9)

## 2016-12-23 LAB — LIPID PANEL
CHOLESTEROL TOTAL: 218 mg/dL — AB (ref 100–199)
Chol/HDL Ratio: 3.7 ratio (ref 0.0–4.4)
HDL: 59 mg/dL (ref >39)
LDL CALC: 136 mg/dL — AB (ref 0–99)
Triglycerides: 116 mg/dL (ref 0–149)
VLDL CHOLESTEROL CAL: 23 mg/dL (ref 5–40)

## 2016-12-23 LAB — BASIC METABOLIC PANEL
BUN/Creatinine Ratio: 18 (ref 9–23)
BUN: 14 mg/dL (ref 6–24)
CO2: 24 mmol/L (ref 18–29)
CREATININE: 0.77 mg/dL (ref 0.57–1.00)
Calcium: 8.5 mg/dL — ABNORMAL LOW (ref 8.7–10.2)
Chloride: 105 mmol/L (ref 96–106)
GFR calc non Af Amer: 85 mL/min/{1.73_m2} (ref >59)
GFR, EST AFRICAN AMERICAN: 98 mL/min/{1.73_m2} (ref >59)
Glucose: 112 mg/dL — ABNORMAL HIGH (ref 65–99)
POTASSIUM: 4.8 mmol/L (ref 3.5–5.2)
SODIUM: 143 mmol/L (ref 134–144)

## 2016-12-23 LAB — HIV ANTIBODY (ROUTINE TESTING W REFLEX): HIV Screen 4th Generation wRfx: NONREACTIVE

## 2016-12-27 ENCOUNTER — Ambulatory Visit (HOSPITAL_COMMUNITY): Payer: Managed Care, Other (non HMO)

## 2017-02-01 ENCOUNTER — Other Ambulatory Visit (HOSPITAL_COMMUNITY): Payer: Self-pay | Admitting: Hematology & Oncology

## 2017-03-03 ENCOUNTER — Other Ambulatory Visit: Payer: Self-pay | Admitting: Obstetrics & Gynecology

## 2017-03-03 ENCOUNTER — Other Ambulatory Visit: Payer: Self-pay | Admitting: Adult Health

## 2017-03-03 DIAGNOSIS — Z9889 Other specified postprocedural states: Secondary | ICD-10-CM

## 2017-03-22 ENCOUNTER — Other Ambulatory Visit: Payer: Self-pay | Admitting: Gastroenterology

## 2017-03-25 ENCOUNTER — Encounter: Payer: Self-pay | Admitting: Internal Medicine

## 2017-03-25 NOTE — Telephone Encounter (Signed)
Needs nonurgent OV with RMR only.  RX sent.

## 2017-03-25 NOTE — Telephone Encounter (Signed)
APPT MADE AND LETTER SENT  °

## 2017-04-14 ENCOUNTER — Ambulatory Visit
Admission: RE | Admit: 2017-04-14 | Discharge: 2017-04-14 | Disposition: A | Discharge status: Home / Self Care | Payer: Managed Care, Other (non HMO) | Source: Ambulatory Visit | Attending: Adult Health | Admitting: Adult Health

## 2017-04-14 DIAGNOSIS — Z9889 Other specified postprocedural states: Secondary | ICD-10-CM

## 2017-04-14 HISTORY — DX: Personal history of irradiation: Z92.3

## 2017-04-29 ENCOUNTER — Ambulatory Visit: Payer: Managed Care, Other (non HMO) | Admitting: Internal Medicine

## 2017-05-27 ENCOUNTER — Encounter (INDEPENDENT_AMBULATORY_CARE_PROVIDER_SITE_OTHER): Payer: Self-pay

## 2017-05-27 ENCOUNTER — Encounter: Payer: Self-pay | Admitting: Adult Health

## 2017-05-27 ENCOUNTER — Ambulatory Visit (INDEPENDENT_AMBULATORY_CARE_PROVIDER_SITE_OTHER): Payer: Managed Care, Other (non HMO) | Admitting: Adult Health

## 2017-05-27 VITALS — BP 136/80 | HR 86 | Resp 16 | Ht 63.25 in | Wt 191.0 lb

## 2017-05-27 DIAGNOSIS — Z1212 Encounter for screening for malignant neoplasm of rectum: Secondary | ICD-10-CM

## 2017-05-27 DIAGNOSIS — Z01411 Encounter for gynecological examination (general) (routine) with abnormal findings: Secondary | ICD-10-CM | POA: Diagnosis not present

## 2017-05-27 DIAGNOSIS — I10 Essential (primary) hypertension: Secondary | ICD-10-CM

## 2017-05-27 DIAGNOSIS — R195 Other fecal abnormalities: Secondary | ICD-10-CM

## 2017-05-27 DIAGNOSIS — Z853 Personal history of malignant neoplasm of breast: Secondary | ICD-10-CM | POA: Diagnosis not present

## 2017-05-27 DIAGNOSIS — Z1211 Encounter for screening for malignant neoplasm of colon: Secondary | ICD-10-CM

## 2017-05-27 DIAGNOSIS — Z01419 Encounter for gynecological examination (general) (routine) without abnormal findings: Secondary | ICD-10-CM

## 2017-05-27 LAB — HEMOCCULT GUIAC POC 1CARD (OFFICE): FECAL OCCULT BLD: POSITIVE — AB

## 2017-05-27 NOTE — Patient Instructions (Signed)
Do 3 hemoccult cards  Pap and physical in 1 year Mammogram yearly Labs per PCP

## 2017-05-27 NOTE — Progress Notes (Signed)
Patient ID: Beth Hamilton, female   DOB: [birth year], 61 y.o.   MRN: [id number] History of Present Illness: Beth Hamilton is a 61 year old white female, married, PM in for well woman gyn exam, she had normal pap with negative HPV 05/19/15. PCP is Dr Wolfgang Phoenix.    Current Medications, Allergies, Past Medical History, Past Surgical History, Family History and Social History were reviewed in [employer] record.     Review of Systems: Patient denies any headaches, hearing loss, fatigue, blurred vision, shortness of breath, chest pain, abdominal pain, problems with bowel movements, urination, or intercourse. No joint pain or mood swings.    Physical Exam: General:  Well developed, well nourished, no acute distress Skin:  Warm and dry Neck:  Midline trachea, normal thyroid, good ROM, no lymphadenopathy Lungs; Clear to auscultation bilaterally Breast:  No dominant palpable mass, retraction, or nipple discharge Cardiovascular: Regular rate and rhythm Abdomen:  Soft, non tender, no hepatosplenomegaly Pelvic:  External genitalia is normal in appearance, no lesions.  The vagina is pale with loss of moisture and rugae. Urethra has no lesions or masses. The cervix is smooth.  Uterus is felt to be normal size, shape, and contour.  No adnexal masses or tenderness noted.Bladder is non tender, no masses felt. Rectal: Good sphincter tone, no polyps, or hemorrhoids felt.  Hemoccult positive. Extremities/musculoskeletal:  No swelling or varicosities noted, no clubbing or cyanosis Psych:  No mood changes, alert and cooperative,seems happy PHQ 2 score 0.She says she had BM today and has history of UC, will give 3 hemoccult cards to take home and do.   Impression: 1. Well woman exam with routine gynecological exam   2. History of breast cancer   3. HTN (hypertension), benign   4. Screening for colorectal cancer   5. Positive fecal occult blood test       Plan: Do 3 hemoccult cards  Pap and  physical in 1 year Mammogram yearly Labs per PCP

## 2017-06-03 ENCOUNTER — Other Ambulatory Visit (INDEPENDENT_AMBULATORY_CARE_PROVIDER_SITE_OTHER): Payer: Managed Care, Other (non HMO)

## 2017-06-03 DIAGNOSIS — Z1211 Encounter for screening for malignant neoplasm of colon: Secondary | ICD-10-CM | POA: Diagnosis not present

## 2017-06-03 LAB — HEMOCCULT GUIAC POC 1CARD (OFFICE)
Card #2 Fecal Occult Blod, POC: NEGATIVE
FECAL OCCULT BLD: NEGATIVE
Fecal Occult Blood, POC: NEGATIVE

## 2017-06-06 ENCOUNTER — Ambulatory Visit: Payer: Managed Care, Other (non HMO) | Admitting: Family Medicine

## 2017-06-09 ENCOUNTER — Ambulatory Visit: Payer: Managed Care, Other (non HMO) | Admitting: Family Medicine

## 2017-06-09 ENCOUNTER — Encounter: Payer: Self-pay | Admitting: Family Medicine

## 2017-06-09 VITALS — BP 122/82 | Ht 64.0 in | Wt 190.0 lb

## 2017-06-09 DIAGNOSIS — E7849 Other hyperlipidemia: Secondary | ICD-10-CM | POA: Diagnosis not present

## 2017-06-09 DIAGNOSIS — R7301 Impaired fasting glucose: Secondary | ICD-10-CM

## 2017-06-09 DIAGNOSIS — I10 Essential (primary) hypertension: Secondary | ICD-10-CM

## 2017-06-09 MED ORDER — LISINOPRIL 10 MG PO TABS: 10.0000 mg | ORAL_TABLET | Freq: Every day | ORAL | 6 refills | Status: DC

## 2017-06-09 NOTE — Patient Instructions (Signed)
DASH Eating Plan DASH stands for "Dietary Approaches to Stop Hypertension." The DASH eating plan is a healthy eating plan that has been shown to reduce high blood pressure (hypertension). It may also reduce your risk for type 2 diabetes, heart disease, and stroke. The DASH eating plan may also help with weight loss. What are tips for following this plan? General guidelines  Avoid eating more than 2,300 mg (milligrams) of salt (sodium) a day. If you have hypertension, you may need to reduce your sodium intake to 1,500 mg a day.  Limit alcohol intake to no more than 1 drink a day for nonpregnant women and 2 drinks a day for men. One drink equals 12 oz of beer, 5 oz of wine, or 1 oz of hard liquor.  Work with your health care provider to maintain a healthy body weight or to lose weight. Ask what an ideal weight is for you.  Get at least 30 minutes of exercise that causes your heart to beat faster (aerobic exercise) most days of the week. Activities may include walking, swimming, or biking.  Work with your health care provider or diet and nutrition specialist (dietitian) to adjust your eating plan to your individual calorie needs. Reading food labels  Check food labels for the amount of sodium per serving. Choose foods with less than 5 percent of the Daily Value of sodium. Generally, foods with less than 300 mg of sodium per serving fit into this eating plan.  To find whole grains, look for the word "whole" as the first word in the ingredient list. Shopping  Buy products labeled as "low-sodium" or "no salt added."  Buy fresh foods. Avoid canned foods and premade or frozen meals. Cooking  Avoid adding salt when cooking. Use salt-free seasonings or herbs instead of table salt or sea salt. Check with your health care provider or pharmacist before using salt substitutes.  Do not fry foods. Cook foods using healthy methods such as baking, boiling, grilling, and broiling instead.  Cook with  heart-healthy oils, such as olive, canola, soybean, or sunflower oil. Meal planning   Eat a balanced diet that includes: ? 5 or more servings of fruits and vegetables each day. At each meal, try to fill half of your plate with fruits and vegetables. ? Up to 6-8 servings of whole grains each day. ? Less than 6 oz of lean meat, poultry, or fish each day. A 3-oz serving of meat is about the same size as a deck of cards. One egg equals 1 oz. ? 2 servings of low-fat dairy each day. ? A serving of nuts, seeds, or beans 5 times each week. ? Heart-healthy fats. Healthy fats called Omega-3 fatty acids are found in foods such as flaxseeds and coldwater fish, like sardines, salmon, and mackerel.  Limit how much you eat of the following: ? Canned or prepackaged foods. ? Food that is high in trans fat, such as fried foods. ? Food that is high in saturated fat, such as fatty meat. ? Sweets, desserts, sugary drinks, and other foods with added sugar. ? Full-fat dairy products.  Do not salt foods before eating.  Try to eat at least 2 vegetarian meals each week.  Eat more home-cooked food and less restaurant, buffet, and fast food.  When eating at a restaurant, ask that your food be prepared with less salt or no salt, if possible. What foods are recommended? The items listed may not be a complete list. Talk with your dietitian about what   dietary choices are best for you. Grains Whole-grain or whole-wheat bread. Whole-grain or whole-wheat pasta. Brown rice. Oatmeal. Quinoa. Bulgur. Whole-grain and low-sodium cereals. Pita bread. Low-fat, low-sodium crackers. Whole-wheat flour tortillas. Vegetables Fresh or frozen vegetables (raw, steamed, roasted, or grilled). Low-sodium or reduced-sodium tomato and vegetable juice. Low-sodium or reduced-sodium tomato sauce and tomato paste. Low-sodium or reduced-sodium canned vegetables. Fruits All fresh, dried, or frozen fruit. Canned fruit in natural juice (without  added sugar). Meat and other protein foods Skinless chicken or turkey. Ground chicken or turkey. Pork with fat trimmed off. Fish and seafood. Egg whites. Dried beans, peas, or lentils. Unsalted nuts, nut butters, and seeds. Unsalted canned beans. Lean cuts of beef with fat trimmed off. Low-sodium, lean deli meat. Dairy Low-fat (1%) or fat-free (skim) milk. Fat-free, low-fat, or reduced-fat cheeses. Nonfat, low-sodium ricotta or cottage cheese. Low-fat or nonfat yogurt. Low-fat, low-sodium cheese. Fats and oils Soft margarine without trans fats. Vegetable oil. Low-fat, reduced-fat, or light mayonnaise and salad dressings (reduced-sodium). Canola, safflower, olive, soybean, and sunflower oils. Avocado. Seasoning and other foods Herbs. Spices. Seasoning mixes without salt. Unsalted popcorn and pretzels. Fat-free sweets. What foods are not recommended? The items listed may not be a complete list. Talk with your dietitian about what dietary choices are best for you. Grains Baked goods made with fat, such as croissants, muffins, or some breads. Dry pasta or rice meal packs. Vegetables Creamed or fried vegetables. Vegetables in a cheese sauce. Regular canned vegetables (not low-sodium or reduced-sodium). Regular canned tomato sauce and paste (not low-sodium or reduced-sodium). Regular tomato and vegetable juice (not low-sodium or reduced-sodium). Pickles. Olives. Fruits Canned fruit in a light or heavy syrup. Fried fruit. Fruit in cream or butter sauce. Meat and other protein foods Fatty cuts of meat. Ribs. Fried meat. Bacon. Sausage. Bologna and other processed lunch meats. Salami. Fatback. Hotdogs. Bratwurst. Salted nuts and seeds. Canned beans with added salt. Canned or smoked fish. Whole eggs or egg yolks. Chicken or turkey with skin. Dairy Whole or 2% milk, cream, and half-and-half. Whole or full-fat cream cheese. Whole-fat or sweetened yogurt. Full-fat cheese. Nondairy creamers. Whipped toppings.  Processed cheese and cheese spreads. Fats and oils Butter. Stick margarine. Lard. Shortening. Ghee. Bacon fat. Tropical oils, such as coconut, palm kernel, or palm oil. Seasoning and other foods Salted popcorn and pretzels. Onion salt, garlic salt, seasoned salt, table salt, and sea salt. Worcestershire sauce. Tartar sauce. Barbecue sauce. Teriyaki sauce. Soy sauce, including reduced-sodium. Steak sauce. Canned and packaged gravies. Fish sauce. Oyster sauce. Cocktail sauce. Horseradish that you find on the shelf. Ketchup. Mustard. Meat flavorings and tenderizers. Bouillon cubes. Hot sauce and Tabasco sauce. Premade or packaged marinades. Premade or packaged taco seasonings. Relishes. Regular salad dressings. Where to find more information:  National Heart, Lung, and Blood Institute: www.nhlbi.nih.gov  American Heart Association: www.heart.org Summary  The DASH eating plan is a healthy eating plan that has been shown to reduce high blood pressure (hypertension). It may also reduce your risk for type 2 diabetes, heart disease, and stroke.  With the DASH eating plan, you should limit salt (sodium) intake to 2,300 mg a day. If you have hypertension, you may need to reduce your sodium intake to 1,500 mg a day.  When on the DASH eating plan, aim to eat more fresh fruits and vegetables, whole grains, lean proteins, low-fat dairy, and heart-healthy fats.  Work with your health care provider or diet and nutrition specialist (dietitian) to adjust your eating plan to your individual   calorie needs. This information is not intended to replace advice given to you by your health care provider. Make sure you discuss any questions you have with your health care provider. Document Released: 06/27/2011 Document Revised: 07/01/2016 Document Reviewed: 07/01/2016 Elsevier Interactive Patient Education  2017 Elsevier Inc.  

## 2017-06-09 NOTE — Progress Notes (Signed)
   Subjective:    Patient ID: Beth Hamilton, female    DOB: 1955-09-10, 61 y.o.   MRN: [id number]  Hypertension  This is a chronic problem. The current episode started more than 1 year ago. Pertinent negatives include no chest pain.  The patient takes her medicine on a regular basis she states she does try to eat somewhat healthy does do a little bit of walking on a regular basis denies chest tightness pressure pain  Has a history of elevated fasting glucose and hyperlipidemia  Patient states no other concerns this visit.   Review of Systems  Constitutional: Negative for activity change, appetite change and fatigue.  HENT: Negative for congestion.   Respiratory: Negative for cough.   Cardiovascular: Negative for chest pain.  Gastrointestinal: Negative for abdominal pain.  Endocrine: Negative for polydipsia and polyphagia.  Skin: Negative for color change.  Neurological: Negative for weakness.  Psychiatric/Behavioral: Negative for confusion.       Objective:   Physical Exam  Constitutional: She appears well-developed and well-nourished. No distress.  HENT:  Head: Normocephalic and atraumatic.  Eyes: Right eye exhibits no discharge. Left eye exhibits no discharge.  Neck: No tracheal deviation present.  Cardiovascular: Normal rate, regular rhythm and normal heart sounds.  No murmur heard. Pulmonary/Chest: Effort normal and breath sounds normal. No respiratory distress. She has no wheezes. She has no rales.  Musculoskeletal: She exhibits no edema.  Lymphadenopathy:    She has no cervical adenopathy.  Neurological: She is alert. She exhibits normal muscle tone.  Skin: Skin is warm and dry. No erythema.  Psychiatric: Her behavior is normal.  Vitals reviewed.         Assessment & Plan:  HTN decent control watch diet minimize salt walk for exercise watch portions try to lose some weight  History of fasting hyperglycemia check lab work check A1c  History of  hyperlipidemia HDL has been good watch diet check lab work  Follow-up 6 months

## 2017-06-19 ENCOUNTER — Other Ambulatory Visit (HOSPITAL_COMMUNITY): Payer: Self-pay | Admitting: *Deleted

## 2017-06-20 ENCOUNTER — Encounter (HOSPITAL_COMMUNITY): Payer: Managed Care, Other (non HMO) | Admitting: Oncology

## 2017-06-20 ENCOUNTER — Encounter (HOSPITAL_COMMUNITY): Payer: Managed Care, Other (non HMO) | Attending: Oncology

## 2017-06-20 ENCOUNTER — Other Ambulatory Visit: Payer: Self-pay

## 2017-06-20 ENCOUNTER — Ambulatory Visit (HOSPITAL_COMMUNITY): Payer: Managed Care, Other (non HMO)

## 2017-06-20 ENCOUNTER — Encounter (HOSPITAL_BASED_OUTPATIENT_CLINIC_OR_DEPARTMENT_OTHER): Payer: Managed Care, Other (non HMO)

## 2017-06-20 ENCOUNTER — Other Ambulatory Visit (HOSPITAL_COMMUNITY): Payer: Managed Care, Other (non HMO)

## 2017-06-20 ENCOUNTER — Other Ambulatory Visit (HOSPITAL_COMMUNITY): Payer: Self-pay | Admitting: Adult Health

## 2017-06-20 ENCOUNTER — Encounter (HOSPITAL_COMMUNITY): Payer: Self-pay

## 2017-06-20 ENCOUNTER — Encounter (HOSPITAL_COMMUNITY): Payer: Self-pay | Admitting: Oncology

## 2017-06-20 VITALS — BP 171/101 | HR 103 | Temp 98.7°F | Resp 18 | Ht 64.0 in | Wt 193.0 lb

## 2017-06-20 DIAGNOSIS — M8580 Other specified disorders of bone density and structure, unspecified site: Secondary | ICD-10-CM

## 2017-06-20 DIAGNOSIS — Z170 Estrogen receptor positive status [ER+]: Secondary | ICD-10-CM | POA: Insufficient documentation

## 2017-06-20 DIAGNOSIS — C50512 Malignant neoplasm of lower-outer quadrant of left female breast: Secondary | ICD-10-CM

## 2017-06-20 DIAGNOSIS — Z79811 Long term (current) use of aromatase inhibitors: Secondary | ICD-10-CM | POA: Diagnosis not present

## 2017-06-20 DIAGNOSIS — M810 Age-related osteoporosis without current pathological fracture: Secondary | ICD-10-CM

## 2017-06-20 LAB — COMPREHENSIVE METABOLIC PANEL
ALK PHOS: 47 U/L (ref 38–126)
ALT: 20 U/L (ref 14–54)
ANION GAP: 8 (ref 5–15)
AST: 30 U/L (ref 15–41)
Albumin: 4.3 g/dL (ref 3.5–5.0)
BUN: 18 mg/dL (ref 6–20)
CALCIUM: 9.2 mg/dL (ref 8.9–10.3)
CHLORIDE: 100 mmol/L — AB (ref 101–111)
CO2: 30 mmol/L (ref 22–32)
Creatinine, Ser: 0.91 mg/dL (ref 0.44–1.00)
Glucose, Bld: 142 mg/dL — ABNORMAL HIGH (ref 65–99)
Potassium: 3.8 mmol/L (ref 3.5–5.1)
SODIUM: 138 mmol/L (ref 135–145)
Total Bilirubin: 1 mg/dL (ref 0.3–1.2)
Total Protein: 7.3 g/dL (ref 6.5–8.1)

## 2017-06-20 LAB — CBC WITH DIFFERENTIAL/PLATELET
Basophils Absolute: 0 10*3/uL (ref 0.0–0.1)
Basophils Relative: 0 %
EOS ABS: 0.1 10*3/uL (ref 0.0–0.7)
EOS PCT: 1 %
HCT: 43.3 % (ref 36.0–46.0)
Hemoglobin: 13.3 g/dL (ref 12.0–15.0)
LYMPHS ABS: 2 10*3/uL (ref 0.7–4.0)
Lymphocytes Relative: 29 %
MCH: 33.1 pg (ref 26.0–34.0)
MCHC: 30.7 g/dL (ref 30.0–36.0)
MCV: 107.7 fL — ABNORMAL HIGH (ref 78.0–100.0)
MONOS PCT: 5 %
Monocytes Absolute: 0.4 10*3/uL (ref 0.1–1.0)
Neutro Abs: 4.4 10*3/uL (ref 1.7–7.7)
Neutrophils Relative %: 65 %
PLATELETS: 208 10*3/uL (ref 150–400)
RBC: 4.02 MIL/uL (ref 3.87–5.11)
RDW: 13.5 % (ref 11.5–15.5)
WBC: 6.9 10*3/uL (ref 4.0–10.5)

## 2017-06-20 MED ORDER — DENOSUMAB 60 MG/ML ~~LOC~~ SOLN
60.0000 mg | Freq: Once | SUBCUTANEOUS | Status: AC
  Administered 2017-06-20: 60 mg via SUBCUTANEOUS
  Filled 2017-06-20: qty 1

## 2017-06-20 NOTE — Progress Notes (Signed)
Beth Drown, MD Beth Hamilton [place] [address]  PROGRESS NOTE  DIAGNOSIS: Breast cancer of lower-outer quadrant of left female breast   Staging form: Breast, AJCC 7th Edition     Clinical: No stage assigned - Unsigned  Stage I (pT1b pNOMx) infiltrating ductal carcinoma of the left breast, status post lumpectomy and axillary sentinel node sampling, ER/PR positive, HER-2/neu not overexpressed, Oncotype DX score of 7, adjuvant XRT. On Arimidex.  Biopsy was performed on 04/05/2014 at which time a combination of infiltrating ductal carcinoma and noninvasive ductal neoplasia were found, ER 100%, PR 100%  DEXA 05/2014 with osteoporosis   SUMMARY OF ONCOLOGIC HISTORY:   Breast cancer of lower-outer quadrant of left female breast ([place])   05/24/2014 Initial Diagnosis    Breast cancer of lower-outer quadrant of left female breast      06/22/2014 - 08/05/2014 Radiation Therapy    Left breast 50 Gy at 2 Gy per fraction x 25 fractions with left breast boost 10 Gy at 2 Gy per fraction x 5 fractions by Beth. Pablo Hamilton.       CURRENT THERAPY: Arimidex  INTERVAL HISTORY: Beth Hamilton 61 y.o. female returns for follow-up of a stage I Left breast cancer. She is currently on Arimidex. DEXA scan performed in November 2015 showed osteoporosis.   She presents today for follow-up.  She has been doing well and has no complaints.  She has been tolerating her Arimidex well without any side effects.  She continues to take calcium and vitamin D.  She denies any chest pain, shortness of breath, abdominal pain, nausea, vomiting, diarrhea, hot flashes.  Her appetite and energy level are at 100%.  She denies any pain symptoms.  She had bilateral diagnostic mammogram on 04/14/2017 which was negative for malignancy.  MEDICAL HISTORY: Past Medical History:  Diagnosis Date  . Breast cancer ([place]) 04/20/14   left breast  . Complication of anesthesia    not sure what med was given in 96 with  D&C, was very sore all over. Beth Hamilton did anesthesia. Beth Hamilton did surgery  . Hemorrhoids   . History of breast cancer 05/19/2015  . Hypertension   . Hypothyroidism   . IBD (inflammatory bowel disease)    UC  . Personal history of radiation therapy    2015  . Pneumonia   . Psoriasis   . Thyroid disease   . Ulcerative colitis ([place]) 5 1 2007   Beth Hamilton    has HEMORRHOIDS; UC (ulcerative colitis) ([place]); DIARRHEA; ABDOMINAL CRAMPS; HTN (hypertension), benign; Breast cancer of lower-outer quadrant of left female breast ([place]); Hypothyroidism; Osteoporosis; History of breast cancer; Hyperlipidemia; FUO (fever of unknown origin); Well woman exam with routine gynecological exam; and Positive fecal occult blood test on their problem list.     is allergic to flagyl [metronidazole]; other; and levaquin [levofloxacin in d5w].  Beth Hamilton had no medications administered during this visit.  SURGICAL HISTORY: Past Surgical History:  Procedure Laterality Date  . BREAST LUMPECTOMY Left 04/20/2014  . COLONOSCOPY  10/09/2001   RMR: Internal hemorrhoids and anal papilla; otherwise normal rectum  . COLONOSCOPY  12/10/2005   incomplete tcs-  colitis  . COLONOSCOPY N/A 04/18/2014   Beth.Rourk- abnormal rectum & L colon into the mid descending segment. mucosa was erythematious as well as pale w/ some loss of normal vascular apttern. no erosions or ulcers. pt had some smooth peduculated 3-4cm polyps in the sigmoid segment most c/w pseudopolyps.  the rest of the colonic mucosa appeared normal. bx= inflammatory polyps  . DILATION AND CURETTAGE OF UTERUS  1996   Beth Hamilton  . FLEXIBLE SIGMOIDOSCOPY  02/26/2006   endoscopically normal-appearing rectum, colitis in sigmoid mucosa  . PARTIAL MASTECTOMY WITH NEEDLE LOCALIZATION AND AXILLARY SENTINEL LYMPH NODE BX Left 04/20/2014   Procedure: PARTIAL MASTECTOMY AFTER NEEDLE LOCALIZATION AND AXILLARY SENTINEL LYMPH NODE BX;  Surgeon: Beth So, MD;   Location: [facility];  Service: General;  Laterality: Left;  . TUBAL LIGATION    . WISDOM TOOTH EXTRACTION      SOCIAL HISTORY: Social History   Socioeconomic History  . Marital status: Married    Spouse name: Not on file  . Number of children: Not on file  . Years of education: Not on file  . Highest education level: Not on file  Social Needs  . Financial resource strain: Not on file  . Food insecurity - worry: Not on file  . Food insecurity - inability: Not on file  . Transportation needs - medical: Not on file  . Transportation needs - non-medical: Not on file  Occupational History  . Not on file  Tobacco Use  . Smoking status: Former Smoker    Packs/day: 0.00    Years: 15.00    Pack years: 0.00    Types: Cigarettes    Last attempt to quit: 07/26/2005    Years since quitting: 11.9  . Smokeless tobacco: Never Used  . Tobacco comment: quit in 2007  Substance and Sexual Activity  . Alcohol use: Yes    Comment: wine every day  . Drug use: No  . Sexual activity: Yes    Birth control/protection: Post-menopausal  Other Topics Concern  . Not on file  Social History Narrative  . Not on file    FAMILY HISTORY: Family History  Problem Relation Age of Onset  . Stroke Maternal Grandfather   . Heart attack Maternal Grandfather   . Hypertension Mother   . Diabetes Paternal Grandfather   . Heart disease Paternal Grandfather   . Colon cancer Neg Hx     Review of Systems  Constitutional: Negative.        Good appetite.   HENT: Negative.   Eyes: Negative.   Respiratory: Negative.   Cardiovascular: Negative.   Gastrointestinal: Negative.   Genitourinary: Negative.   Musculoskeletal: Negative.   Skin: Negative.   Neurological: Negative.   Endo/Heme/Allergies: Negative.   Psychiatric/Behavioral: Negative.   All other systems reviewed and are negative. 14 point review of systems was performed and is negative except as detailed under history of present illness and  above   PHYSICAL EXAMINATION  ECOG PERFORMANCE STATUS: 0 - Asymptomatic  BP (!) 171/101 (BP Location: Right Arm, Patient Position: Sitting)   Pulse (!) 103   Temp 98.7 F (37.1 C) (Oral)   Resp 18   Ht _0  (1.626 m)   Wt 193 lb (87.5 kg)   SpO2 97%   BMI 33.13 kg/m    Physical Exam  Constitutional: She is oriented to person, place, and time and well-developed, well-nourished, and in no distress.  Pt was able to get on exam table without assistance.   HENT:  Head: Normocephalic and atraumatic.  Nose: Nose normal.  Mouth/Throat: Oropharynx is clear and moist. No oropharyngeal exudate.  Eyes: Conjunctivae and EOM are normal. Pupils are equal, round, and reactive to light. Right eye exhibits no discharge. Left eye exhibits no discharge. No scleral icterus.  Neck:  Normal range of motion. Neck supple. No tracheal deviation present. No thyromegaly present.  Cardiovascular: Normal rate, regular rhythm and normal heart sounds. Exam reveals no gallop and no friction rub.  No murmur heard. Pulmonary/Chest: Effort normal and breath sounds normal. She has no wheezes. She has no rales. Right breast exhibits no inverted nipple, no mass, no nipple discharge, no skin change and no tenderness. Left breast exhibits no inverted nipple, no mass, no nipple discharge, no skin change and no tenderness.  Abdominal: Soft. Bowel sounds are normal. She exhibits no distension and no mass. There is no tenderness. There is no rebound and no guarding.  Musculoskeletal: Normal range of motion. She exhibits no edema.  Lymphadenopathy:    She has no cervical adenopathy.  Neurological: She is alert and oriented to person, place, and time. She has normal reflexes. No cranial nerve deficit. Gait normal. Coordination normal.  Skin: Skin is warm and dry. No rash noted.  Psychiatric: Mood, memory, affect and judgment normal.  Nursing note and vitals reviewed.   LABORATORY DATA: I have reviewed the data as  listed.  CBC    Component Value Date/Time   WBC 6.9 06/20/2017 1326   RBC 4.02 06/20/2017 1326   HGB 13.3 06/20/2017 1326   HGB 11.3 04/26/2015 0815   HCT 43.3 06/20/2017 1326   HCT 34.9 04/26/2015 0815   PLT 208 06/20/2017 1326   PLT 520 (H) 04/26/2015 0815   MCV 107.7 (H) 06/20/2017 1326   MCV 95 04/26/2015 0815   MCH 33.1 06/20/2017 1326   MCHC 30.7 06/20/2017 1326   RDW 13.5 06/20/2017 1326   RDW 12.6 04/26/2015 0815   LYMPHSABS 2.0 06/20/2017 1326   LYMPHSABS 1.0 04/26/2015 0815   MONOABS 0.4 06/20/2017 1326   EOSABS 0.1 06/20/2017 1326   EOSABS 0.2 04/26/2015 0815   BASOSABS 0.0 06/20/2017 1326   BASOSABS 0.0 04/26/2015 0815   CMP     Component Value Date/Time   NA 143 12/21/2016 0909   K 4.8 12/21/2016 0909   CL 105 12/21/2016 0909   CO2 24 12/21/2016 0909   GLUCOSE 112 (H) 12/21/2016 0909   GLUCOSE 140 (H) 12/18/2016 1316   BUN 14 12/21/2016 0909   CREATININE 0.77 12/21/2016 0909   CREATININE 0.69 11/20/2013 1004   CALCIUM 8.5 (L) 12/21/2016 0909   PROT 7.7 12/18/2016 1316   ALBUMIN 4.4 12/18/2016 1316   AST 28 12/18/2016 1316   ALT 19 12/18/2016 1316   ALKPHOS 72 12/18/2016 1316   BILITOT 0.9 12/18/2016 1316   GFRNONAA 85 12/21/2016 0909   GFRAA 98 12/21/2016 0909   DEXA (07/05/16) ASSESSMENT: BMD as determined from [facility] L2-L4 is 0.915 g/cm2 with a T-Score of -2.4. This patient is considered osteopenic according to [facility]) criteria. Compared with the prior study on 06/07/2014, the BMD of the lumbar spine shows a statistically significant increase. Compared with the prior study on 06/07/2014, the BMD of the left femoral neck shows no statistically significant change. (L-1 was excluded due to advanced degenerative changes.) (Patient is not a candidate for FRAX assessment due to history of recent Prolia injections.)  ASSESSMENT and THERAPY PLAN:  Stage I ER positive, PR positive, HER-2 negative carcinoma of the left  breast. Osteoporosis, on prolia.  DEXA scan from 06/2016 showed her osteoporosis has improved to osteopenia.    Clinically NED. Breast exam on next visit. Refilled arimidex today.  Continue arimidex and calcium and vitamin D.  Continue prolia q 32month, she  will her dose of prolia today. . Reviewed her DEXA scan from 06/2016 with the patient. Her osteoporosis has improved to osteopenia. Order repeat DEXA scan on next visit. She had a negative bilateral diagnostic mammogram with TOMO in 03/2017. Next one is due in 03/2018.   RTC in 6 months.   NCCN guidelines recommends the following surveillance for invasive breast cancer:  A. History and Physical exam every 4-6 months for 5 years and then every 12 months.  B. Mammography every 12 months  C. Women on Tamoxifen: annual gynecologic assessment every 12 months if uterus is present.  D. Women on aromatase inhibitor or who experience ovarian failure secondary to treatment should have monitoring of bone health with a bone mineral density determination at baseline and periodically thereafter.  E. Assess and encourage adherence to adjuvant endocrine therapy.  F. Evidence suggests that active lifestyle and achieving and maintaining an ideal body weight (20-25 BMI) may lead to optimal breast cancer outcomes.  In a two-year randomized trial of denosumab (60 mg subcutaneously every six months) versus placebo in 250 postmenopausal osteopenic (mean T-scores -0.88 to -1.33) women receiving adjuvant AI therapy, subjects in the denosumab group had significant increases in LS, TH, and femoral neck BMD compared with placebo (between group differences of 7.6, 4.7, and 3.6 percent, respectively).There were no vertebral fractures, and nonvertebral fractures occurred in eight subjects in each group. The most common side effects included arthralgia, back pain, and fatigue.    All questions were answered. The patient knows to call the clinic with any problems,  questions or concerns. We can certainly see the patient much sooner if necessary.   Twana First, MD

## 2017-06-20 NOTE — Patient Instructions (Signed)
[place] at [facility] Discharge Instructions  RECOMMENDATIONS MADE BY THE CONSULTANT AND ANY TEST RESULTS WILL BE SENT TO YOUR REFERRING PHYSICIAN.  You had your prolia injection today Follow up as scheduled.  Thank you for choosing [place] at [facility] to provide your oncology and hematology care.  To afford each patient quality time with our provider, please arrive at least 15 minutes before your scheduled appointment time.    If you have a lab appointment with the [facility] please come in thru the  Main Entrance and check in at the main information desk  You need to re-schedule your appointment should you arrive 10 or more minutes late.  We strive to give you quality time with our providers, and arriving late affects you and other patients whose appointments are after yours.  Also, if you no show three or more times for appointments you may be dismissed from the clinic at the providers discretion.     Again, thank you for choosing [facility].  Our hope is that these requests will decrease the amount of time that you wait before being seen by our physicians.       _____________________________________________________________  Should you have questions after your visit to [facility], please contact our office at [phone] between the hours of 8:30 a.m. and 4:30 p.m.  Voicemails left after 4:30 p.m. will not be returned until the following business day.  For prescription refill requests, have your pharmacy contact our office.       Resources For Cancer Patients and their Caregivers ? American Cancer Society: Can assist with transportation, wigs, general needs, runs Look Good Feel Better.        831-475-7862 ? Cancer Care: Provides financial assistance, online support groups, medication/co-pay assistance.  1-800-813-HOPE 361-510-7332) ? [place] Assists [place] cancer  patients and their families through emotional , educational and financial support.  (289)038-0503 ? [place] DSS Where to apply for food stamps, Medicaid and utility assistance. 757-793-7563 ? [facility]: Transportation to medical appointments. 781-165-2331 ? Social Security Administration: May apply for disability if have a Stage IV cancer. (910)728-8232 3020557198 ? [employer], Disability and Transit Services: Assists with nutrition, care and transit needs. [place] Support Programs: @10RELATIVEDAYS @ > Cancer Support Group  2nd Tuesday of the month 1pm-2pm, Journey Room  > Creative Journey  3rd Tuesday of the month 1130am-1pm, Journey Room  > Look Good Feel Better  1st Wednesday of the month 10am-12 noon, Journey Room (Call [place] to register [phone])

## 2017-06-20 NOTE — Progress Notes (Signed)
Beth Hamilton presents today for injection per MD orders. Prolia 60 mg administered SQ in right lower abdomen. Administration without incident. Patient tolerated well. Patient tolerated treatment without incidence. Patient discharged ambulatory and in stable condition from clinic. Patient to follow up as scheduled.

## 2017-06-27 ENCOUNTER — Other Ambulatory Visit (HOSPITAL_COMMUNITY): Payer: Managed Care, Other (non HMO)

## 2017-06-27 ENCOUNTER — Ambulatory Visit (HOSPITAL_COMMUNITY): Payer: Managed Care, Other (non HMO)

## 2017-06-28 LAB — BASIC METABOLIC PANEL
BUN / CREAT RATIO: 18 (ref 9–23)
BUN: 16 mg/dL (ref 6–24)
CALCIUM: 9.5 mg/dL (ref 8.7–10.2)
CHLORIDE: 100 mmol/L (ref 96–106)
CO2: 25 mmol/L (ref 20–29)
CREATININE: 0.89 mg/dL (ref 0.57–1.00)
GFR calc Af Amer: 83 mL/min/{1.73_m2} (ref >59)
GFR calc non Af Amer: 72 mL/min/{1.73_m2} (ref >59)
GLUCOSE: 106 mg/dL — AB (ref 65–99)
Potassium: 4.9 mmol/L (ref 3.5–5.2)
Sodium: 142 mmol/L (ref 134–144)

## 2017-06-28 LAB — LIPID PANEL
Chol/HDL Ratio: 4.5 ratio — ABNORMAL HIGH (ref 0.0–4.4)
Cholesterol, Total: 250 mg/dL — ABNORMAL HIGH (ref 100–199)
HDL: 56 mg/dL (ref >39)
LDL Calculated: 161 mg/dL — ABNORMAL HIGH (ref 0–99)
Triglycerides: 164 mg/dL — ABNORMAL HIGH (ref 0–149)
VLDL Cholesterol Cal: 33 mg/dL (ref 5–40)

## 2017-06-28 LAB — HEMOGLOBIN A1C
ESTIMATED AVERAGE GLUCOSE: 114 mg/dL
HEMOGLOBIN A1C: 5.6 % (ref 4.8–5.6)

## 2017-06-30 ENCOUNTER — Encounter: Payer: Self-pay | Admitting: Family Medicine

## 2017-08-27 ENCOUNTER — Other Ambulatory Visit: Payer: Self-pay | Admitting: Gastroenterology

## 2017-08-28 NOTE — Telephone Encounter (Signed)
LMOM, letting pt know she will need an appointment.

## 2017-08-28 NOTE — Telephone Encounter (Signed)
Please tell the patient I can refill her medications for a couple months, but she hasn't been seen in about 2 years and will need a followup OV to check the status of her disease and receive further refills.

## 2017-08-30 ENCOUNTER — Other Ambulatory Visit (HOSPITAL_COMMUNITY): Payer: Self-pay | Admitting: Oncology

## 2017-10-07 ENCOUNTER — Other Ambulatory Visit: Payer: Self-pay | Admitting: Family Medicine

## 2017-11-06 ENCOUNTER — Encounter: Payer: Self-pay | Admitting: Gastroenterology

## 2017-11-06 ENCOUNTER — Ambulatory Visit: Payer: Managed Care, Other (non HMO) | Admitting: Gastroenterology

## 2017-11-06 VITALS — BP 162/98 | HR 119 | Temp 98.7°F | Ht 64.0 in | Wt 194.6 lb

## 2017-11-06 DIAGNOSIS — K5190 Ulcerative colitis, unspecified, without complications: Secondary | ICD-10-CM

## 2017-11-06 MED ORDER — BALSALAZIDE DISODIUM 750 MG PO CAPS: 2250.0000 mg | ORAL_CAPSULE | Freq: Three times a day (TID) | ORAL | 11 refills | Status: DC

## 2017-11-06 NOTE — Assessment & Plan Note (Signed)
Clinically doing very well. Will continue Colazal as before. Return to the office in 03/2019 and schedule surveillance colonoscopy at that time. Call in interim with any problems.

## 2017-11-06 NOTE — Progress Notes (Signed)
Primary Care Physician: [name], MD  Primary Gastroenterologist:  Garfield Cornea, MD   Chief Complaint  Patient presents with  . Ulcerative Colitis    f/u. Doing well. needs refill    HPI: Beth Hamilton is a 62 y.o. female here for follow-up of indeterminate colitis (likely UC).  Last seen in March 2017.  She has been on Colazal for nearly 2 years. She has had no flares during that time. Clinically doing very well. BM 2 per day. No melena, brbpr. No abd pain. She is due for surveillance colonoscopy 03/2019.    Current Outpatient Medications  Medication Sig Dispense Refill  . acyclovir (ZOVIRAX) 400 MG tablet TAKE (2) TABLETS BY MOUTH TWICE DAILY. (Patient taking differently: TAKE (2) TABLETS BY MOUTH TWICE DAILY AS NEEDED) 20 tablet 11  . acyclovir ointment (ZOVIRAX) 5 % Apply 1 application topically every 4 (four) hours as needed. 30 g 3  . anastrozole (ARIMIDEX) 1 MG tablet TAKE ONE TABLET BY MOUTH ONCE DAILY. 30 tablet 5  . ARMOUR THYROID 30 MG tablet Take 30 mg by mouth daily.     . balsalazide (COLAZAL) 750 MG capsule TAKE 3 CAPSULES BY MOUTH THREE TIMES DAILY. 270 capsule 0  . Calcium Citrate (CITRACAL PO) Take 2 capsules by mouth daily. 1200 mg calcium and Vitamin D 1000 IU    . Cyanocobalamin (VITAMIN B 12 PO) Take by mouth. Sublingual-once daily    . diazepam (VALIUM) 5 MG tablet Take 1 tablet (5 mg total) by mouth every 6 (six) hours as needed for anxiety. 60 tablet 2  . fluticasone (CUTIVATE) 0.05 % cream APPLY TO AFFECTED AREAS TWICE DAILY. (Patient taking differently: APPLY TO AFFECTED AREAS TWICE DAILY AS NEEDED) 60 g 1  . FOLIC ACID PO Take by mouth daily.    Marland Kitchen lisinopril (PRINIVIL,ZESTRIL) 10 MG tablet Take 1 tablet (10 mg total) daily by mouth. 30 tablet 6   No current facility-administered medications for this visit.     Allergies as of 11/06/2017 - Review Complete 11/06/2017  Allergen Reaction Noted  . Flagyl [metronidazole]  10/29/2013  . Other  Other (See Comments) 03/22/2014  . Levaquin [levofloxacin in d5w]  07/23/2013    ROS:  General: Negative for anorexia, weight loss, fever, chills, fatigue, weakness. ENT: Negative for hoarseness, difficulty swallowing , nasal congestion. CV: Negative for chest pain, angina, palpitations, dyspnea on exertion, peripheral edema.  Respiratory: Negative for dyspnea at rest, dyspnea on exertion, cough, sputum, wheezing.  GI: See history of present illness. GU:  Negative for dysuria, hematuria, urinary incontinence, urinary frequency, nocturnal urination.  Endo: Negative for unusual weight change.    Physical Examination:   BP (!) 162/98   Pulse (!) 119   Temp 98.7 F (37.1 C) (Oral)   Ht 5' 4"  (1.626 m)   Wt 194 lb 9.6 oz (88.3 kg)   BMI 33.40 kg/m   General: Well-nourished, well-developed in no acute distress.  Eyes: No icterus. Mouth: Oropharyngeal mucosa moist and pink , no lesions erythema or exudate. Abdomen: Bowel sounds are normal, nontender, nondistended, no hepatosplenomegaly or masses, no abdominal bruits or hernia , no rebound or guarding.   Extremities: No lower extremity edema. No clubbing or deformities. Neuro: Alert and oriented x 4   Skin: Warm and dry, no jaundice.   Psych: Alert and cooperative, normal mood and affect.  Labs:  Lab Results  Component Value Date   CREATININE 0.89 06/27/2017   BUN 16 06/27/2017  NA 142 06/27/2017   K 4.9 06/27/2017   CL 100 06/27/2017   CO2 25 06/27/2017   Lab Results  Component Value Date   ALT 20 06/20/2017   AST 30 06/20/2017   ALKPHOS 47 06/20/2017   BILITOT 1.0 06/20/2017    Imaging Studies: No results found.

## 2017-11-06 NOTE — Patient Instructions (Signed)
1. Continue Colazal 3 capsules three times daily. New RX sent to your pharmacy.  2. We will see you back in 03/2019 and plan for colonoscopy at that time.

## 2017-11-10 NOTE — Progress Notes (Signed)
CC'ED TO PCP 

## 2017-11-28 ENCOUNTER — Other Ambulatory Visit (HOSPITAL_COMMUNITY): Payer: Self-pay | Admitting: Adult Health

## 2017-12-08 ENCOUNTER — Ambulatory Visit: Payer: Managed Care, Other (non HMO) | Admitting: Family Medicine

## 2017-12-09 ENCOUNTER — Encounter: Payer: Self-pay | Admitting: Family Medicine

## 2017-12-09 ENCOUNTER — Ambulatory Visit: Payer: BLUE CROSS/BLUE SHIELD | Admitting: Family Medicine

## 2017-12-09 VITALS — BP 140/98 | Ht 64.0 in | Wt 194.0 lb

## 2017-12-09 DIAGNOSIS — E7849 Other hyperlipidemia: Secondary | ICD-10-CM

## 2017-12-09 DIAGNOSIS — I10 Essential (primary) hypertension: Secondary | ICD-10-CM | POA: Diagnosis not present

## 2017-12-09 DIAGNOSIS — R7301 Impaired fasting glucose: Secondary | ICD-10-CM | POA: Diagnosis not present

## 2017-12-09 MED ORDER — LISINOPRIL 20 MG PO TABS: 20.0000 mg | ORAL_TABLET | Freq: Every day | ORAL | 5 refills | Status: DC

## 2017-12-09 NOTE — Progress Notes (Signed)
   Subjective:    Patient ID: Beth Hamilton, female    DOB: November 18, 1955, 62 y.o.   MRN: [id number]  Hypertension  This is a chronic problem. Pertinent negatives include no chest pain, headaches or shortness of breath. There are no compliance problems.   Patient states she does a good job taking her medicines she states she tries a fair job of watching how she eats she does not do much in the way of exercise other than trying to stay active in the yard and is busy with work she denies chest tightness pressure pain denies shortness of breath or swelling Pt states no concerns today.  Needs refill on lisinopril.    Review of Systems  Constitutional: Negative for activity change, fatigue and fever.  HENT: Negative for congestion.   Respiratory: Negative for cough, chest tightness and shortness of breath.   Cardiovascular: Negative for chest pain and leg swelling.  Gastrointestinal: Negative for abdominal pain.  Skin: Negative for color change.  Neurological: Negative for headaches.  Psychiatric/Behavioral: Negative for behavioral problems.       Objective:   Physical Exam  Constitutional: She appears well-nourished. No distress.  Cardiovascular: Normal rate, regular rhythm and normal heart sounds.  No murmur heard. Pulmonary/Chest: Effort normal and breath sounds normal. No respiratory distress.  Musculoskeletal: She exhibits no edema.  Lymphadenopathy:    She has no cervical adenopathy.  Neurological: She is alert. She exhibits normal muscle tone.  Psychiatric: Her behavior is normal.  Vitals reviewed.         Assessment & Plan:  HTN-subpar control Increase lisinopril 20 mg daily Recommend follow-up if progressive troubles or if worse Has a check with her oncologist next week we will see how her blood pressure is doing there Patient will notify us if any problems Minimize salt diet stay physically active Patient will check lipid profile and glucose because of  hyperlipidemia and fasting hyperglycemia recent A1c within the past 6 months normal Patient will follow-up in 6 months

## 2017-12-18 ENCOUNTER — Inpatient Hospital Stay (HOSPITAL_BASED_OUTPATIENT_CLINIC_OR_DEPARTMENT_OTHER): Payer: BLUE CROSS/BLUE SHIELD | Admitting: Hematology

## 2017-12-18 ENCOUNTER — Inpatient Hospital Stay (HOSPITAL_COMMUNITY): Payer: BLUE CROSS/BLUE SHIELD | Attending: Hematology

## 2017-12-18 ENCOUNTER — Encounter (HOSPITAL_COMMUNITY): Payer: Self-pay | Admitting: Hematology

## 2017-12-18 ENCOUNTER — Telehealth (HOSPITAL_COMMUNITY): Payer: Self-pay | Admitting: Hematology

## 2017-12-18 ENCOUNTER — Inpatient Hospital Stay (HOSPITAL_COMMUNITY): Payer: BLUE CROSS/BLUE SHIELD

## 2017-12-18 ENCOUNTER — Other Ambulatory Visit: Payer: Self-pay

## 2017-12-18 DIAGNOSIS — M810 Age-related osteoporosis without current pathological fracture: Secondary | ICD-10-CM

## 2017-12-18 DIAGNOSIS — C50512 Malignant neoplasm of lower-outer quadrant of left female breast: Secondary | ICD-10-CM

## 2017-12-18 DIAGNOSIS — Z79811 Long term (current) use of aromatase inhibitors: Secondary | ICD-10-CM | POA: Diagnosis not present

## 2017-12-18 DIAGNOSIS — Z170 Estrogen receptor positive status [ER+]: Secondary | ICD-10-CM

## 2017-12-18 DIAGNOSIS — Z923 Personal history of irradiation: Secondary | ICD-10-CM

## 2017-12-18 LAB — COMPREHENSIVE METABOLIC PANEL
ALBUMIN: 4.2 g/dL (ref 3.5–5.0)
ALT: 24 U/L (ref 14–54)
AST: 30 U/L (ref 15–41)
Alkaline Phosphatase: 41 U/L (ref 38–126)
Anion gap: 11 (ref 5–15)
BILIRUBIN TOTAL: 1.5 mg/dL — AB (ref 0.3–1.2)
BUN: 17 mg/dL (ref 6–20)
CHLORIDE: 98 mmol/L — AB (ref 101–111)
CO2: 28 mmol/L (ref 22–32)
Calcium: 10.3 mg/dL (ref 8.9–10.3)
Creatinine, Ser: 0.84 mg/dL (ref 0.44–1.00)
GFR calc Af Amer: >60 mL/min (ref >60)
GFR calc non Af Amer: >60 mL/min (ref >60)
GLUCOSE: 144 mg/dL — AB (ref 65–99)
POTASSIUM: 3.8 mmol/L (ref 3.5–5.1)
SODIUM: 137 mmol/L (ref 135–145)
Total Protein: 7.5 g/dL (ref 6.5–8.1)

## 2017-12-18 LAB — CBC WITH DIFFERENTIAL/PLATELET
BASOS ABS: 0 10*3/uL (ref 0.0–0.1)
BASOS PCT: 0 %
EOS ABS: 0.1 10*3/uL (ref 0.0–0.7)
Eosinophils Relative: 1 %
HEMATOCRIT: 42.9 % (ref 36.0–46.0)
Hemoglobin: 13.8 g/dL (ref 12.0–15.0)
Lymphocytes Relative: 29 %
Lymphs Abs: 1.8 10*3/uL (ref 0.7–4.0)
MCH: 33.7 pg (ref 26.0–34.0)
MCHC: 32.2 g/dL (ref 30.0–36.0)
MCV: 104.6 fL — ABNORMAL HIGH (ref 78.0–100.0)
Monocytes Absolute: 0.3 10*3/uL (ref 0.1–1.0)
Monocytes Relative: 6 %
NEUTROS ABS: 3.9 10*3/uL (ref 1.7–7.7)
NEUTROS PCT: 64 %
Platelets: 218 10*3/uL (ref 150–400)
RBC: 4.1 MIL/uL (ref 3.87–5.11)
RDW: 13.2 % (ref 11.5–15.5)
WBC: 6.1 10*3/uL (ref 4.0–10.5)

## 2017-12-18 MED ORDER — DENOSUMAB 60 MG/ML ~~LOC~~ SOLN: 60.0000 mg | Freq: Once | SUBCUTANEOUS | Status: DC

## 2017-12-18 MED ORDER — ANASTROZOLE 1 MG PO TABS: ORAL_TABLET | ORAL | 6 refills | Status: DC

## 2017-12-18 MED ORDER — DENOSUMAB 60 MG/ML ~~LOC~~ SOSY
60.0000 mg | PREFILLED_SYRINGE | Freq: Once | SUBCUTANEOUS | Status: AC
  Administered 2017-12-18: 60 mg via SUBCUTANEOUS
  Filled 2017-12-18: qty 1

## 2017-12-18 NOTE — Patient Instructions (Signed)
[place] at [facility] Discharge Instructions  Today you saw Dr. Raliegh Ip.    Thank you for choosing [place] [facility] to provide your oncology and hematology care.  To afford each patient quality time with our provider, please arrive at least 15 minutes before your scheduled appointment time.   If you have a lab appointment with the [facility] please come in thru the  Main Entrance and check in at the main information desk  You need to re-schedule your appointment should you arrive 10 or more minutes late.  We strive to give you quality time with our providers, and arriving late affects you and other patients whose appointments are after yours.  Also, if you no show three or more times for appointments you may be dismissed from the clinic at the providers discretion.     Again, thank you for choosing [facility].  Our hope is that these requests will decrease the amount of time that you wait before being seen by our physicians.       _____________________________________________________________  Should you have questions after your visit to [facility], please contact our office at [phone] between the hours of 8:30 a.m. and 4:30 p.m.  Voicemails left after 4:30 p.m. will not be returned until the following business day.  For prescription refill requests, have your pharmacy contact our office.       Resources For Cancer Patients and their Caregivers ? American Cancer Society: Can assist with transportation, wigs, general needs, runs Look Good Feel Better.        769-793-8398 ? Cancer Care: Provides financial assistance, online support groups, medication/co-pay assistance.  1-800-813-HOPE 585-878-4444) ? [facility] Assists [place] cancer patients and their families through emotional , educational and financial support.  757-047-3508 ? [place] DSS Where to apply for food  stamps, Medicaid and utility assistance. 479-879-5516 ? [facility]: Transportation to medical appointments. (212)451-0643 ? Social Security Administration: May apply for disability if have a Stage IV cancer. (646)701-9978 747-786-5878 ? [employer], Disability and Transit Services: Assists with nutrition, care and transit needs. [place] Support Programs:   > Cancer Support Group  2nd Tuesday of the month 1pm-2pm, Journey Room   > Creative Journey  3rd Tuesday of the month 1130am-1pm, Journey Room

## 2017-12-18 NOTE — Progress Notes (Signed)
Patient Care Team: [name], MD as PCP - General (Family Medicine) Gala Romney Cristopher Estimable, MD (Gastroenterology) Aviva Signs, MD as Consulting Physician (General Surgery) Florian Buff, MD as Consulting Physician (Obstetrics and Gynecology)  DIAGNOSIS:  Encounter Diagnosis  Name Primary?  . Malignant neoplasm of lower-outer quadrant of left breast of female, estrogen receptor positive ([place])     SUMMARY OF ONCOLOGIC HISTORY:   Breast cancer of lower-outer quadrant of left female breast ([place])   05/24/2014 Initial Diagnosis    Breast cancer of lower-outer quadrant of left female breast      06/22/2014 - 08/05/2014 Radiation Therapy    Left breast 50 Gy at 2 Gy per fraction x 25 fractions with left breast boost 10 Gy at 2 Gy per fraction x 5 fractions by Dr. Pablo Ledger.       CHIEF COMPLIANT: Follow-up for left breast cancer.  INTERVAL HISTORY: Beth Hamilton is a 62 year old very pleasant female seen in follow-up visit for left breast cancer.  She was originally diagnosed in 2015 and has been started on anastrozole.  She is taking it daily without missing doses.  She denies any hot flashes or musculoskeletal pains.  She is taking calcium pills twice daily.  She is also tolerating Prolia every 6 months very well.  Denies any jaw pains.  Denies any new onset pains.  Denies fevers, night sweats or weight loss.  Denies any recent hospitalizations.  REVIEW OF SYSTEMS:   Constitutional: Denies fevers, chills or abnormal weight loss Eyes: Denies blurriness of vision Ears, nose, mouth, throat, and face: Denies mucositis or sore throat Respiratory: Denies cough, dyspnea or wheezes Cardiovascular: Denies palpitation, chest discomfort Gastrointestinal:  Denies nausea, heartburn or change in bowel habits Skin: Denies abnormal skin rashes Lymphatics: Denies new lymphadenopathy or easy bruising Neurological:Denies numbness, tingling or new weaknesses Behavioral/Psych: Mood is stable, no new  changes  Extremities: No lower extremity edema Breast: Denies feeling any lumps in the breasts.  Mild pain and tenderness in the lumpectomy area on and off.   All other systems were reviewed with the patient and are negative.  I have reviewed the past medical history, past surgical history, social history and family history with the patient and they are unchanged from previous note.  ALLERGIES:  is allergic to flagyl [metronidazole]; other; and levaquin [levofloxacin in d5w].  MEDICATIONS:  Current Outpatient Medications  Medication Sig Dispense Refill  . acyclovir (ZOVIRAX) 400 MG tablet TAKE (2) TABLETS BY MOUTH TWICE DAILY. (Patient taking differently: TAKE (2) TABLETS BY MOUTH TWICE DAILY AS NEEDED) 20 tablet 11  . acyclovir ointment (ZOVIRAX) 5 % Apply 1 application topically every 4 (four) hours as needed. 30 g 3  . anastrozole (ARIMIDEX) 1 MG tablet TAKE ONE TABLET BY MOUTH ONCE DAILY. 30 tablet 5  . ARMOUR THYROID 30 MG tablet Take 30 mg by mouth daily.     . balsalazide (COLAZAL) 750 MG capsule Take 3 capsules (2,250 mg total) by mouth 3 (three) times daily. 270 capsule 11  . Calcium Citrate (CITRACAL PO) Take 2 capsules by mouth daily. 1200 mg calcium and Vitamin D 1000 IU    . Cyanocobalamin (VITAMIN B 12 PO) Take by mouth. Sublingual-once daily    . diazepam (VALIUM) 5 MG tablet Take 1 tablet (5 mg total) by mouth every 6 (six) hours as needed for anxiety. 60 tablet 2  . fluticasone (CUTIVATE) 0.05 % cream APPLY TO AFFECTED AREAS TWICE DAILY. (Patient taking differently: APPLY TO AFFECTED AREAS  TWICE DAILY AS NEEDED) 60 g 1  . FOLIC ACID PO Take by mouth daily.    Marland Kitchen lisinopril (PRINIVIL,ZESTRIL) 20 MG tablet Take 1 tablet (20 mg total) by mouth daily. 30 tablet 5  . anastrozole (ARIMIDEX) 1 MG tablet TAKE (1) TABLET BY MOUTH ONCE DAILY. 30 tablet 6   No current facility-administered medications for this visit.     PHYSICAL EXAMINATION: ECOG PERFORMANCE STATUS: 0 -  Asymptomatic  Vitals:   12/18/17 1436  BP: (!) 176/91  Pulse: (!) 105  Resp: 18  Temp: 98.7 F (37.1 C)  SpO2: 96%   Filed Weights   12/18/17 1436  Weight: 194 lb 14.4 oz (88.4 kg)    GENERAL:alert, no distress and comfortable SKIN: skin color, texture, turgor are normal, no rashes or significant lesions EYES: normal, Conjunctiva are pink and non-injected, sclera clear LYMPH:  no palpable lymphadenopathy in the cervical, axillary or inguinal LUNGS: clear to auscultation and percussion with normal breathing effort HEART: regular rate & rhythm and no murmurs and no lower extremity edema ABDOMEN:abdomen soft, non-tender and normal bowel sounds MUSCULOSKELETAL:no cyanosis of digits and no clubbing   EXTREMITIES: No lower extremity edema BREAST: Left breast upper inner quadrant lumpectomy incision area has thickened scar tissue which is stable.  No palpable mass in bilateral breast.  No palpable axillary adenopathy. LABORATORY DATA:  I have reviewed the data as listed CMP Latest Ref Rng & Units 12/18/2017 06/27/2017 06/20/2017  Glucose 65 - 99 mg/dL 144(H) 106(H) 142(H)  BUN 6 - 20 mg/dL 17 16 18   Creatinine 0.44 - 1.00 mg/dL 0.84 0.89 0.91  Sodium 135 - 145 mmol/L 137 142 138  Potassium 3.5 - 5.1 mmol/L 3.8 4.9 3.8  Chloride 101 - 111 mmol/L 98(L) 100 100(L)  CO2 22 - 32 mmol/L 28 25 30   Calcium 8.9 - 10.3 mg/dL 10.3 9.5 9.2  Total Protein 6.5 - 8.1 g/dL 7.5 - 7.3  Total Bilirubin 0.3 - 1.2 mg/dL 1.5(H) - 1.0  Alkaline Phos 38 - 126 U/L 41 - 47  AST 15 - 41 U/L 30 - 30  ALT 14 - 54 U/L 24 - 20   No results found for: [id number]   Lab Results  Component Value Date   WBC 6.1 12/18/2017   HGB 13.8 12/18/2017   HCT 42.9 12/18/2017   MCV 104.6 (H) 12/18/2017   PLT 218 12/18/2017   NEUTROABS 3.9 12/18/2017    ASSESSMENT & PLAN:  Breast cancer of lower-outer quadrant of left female breast 1.  Stage I (T1b N0) left breast cancer: - Status post lumpectomy on 04/20/2014, 0.9 cm  IDC, grade 1, ER/PR positive, HER-2 negative, Ki-67 of 10% - Oncotype DX score of 7, underwent radiation therapy, started on anastrozole - Tolerating anastrozole very well.  No hot flashes or musculoskeletal symptoms.  Today physical examination shows lumpectomy scar in the upper inner quadrant of the left breast has some scar thickening which is stable.  No palpable mass in bilateral breast.  Last mammogram was on 04/14/2017, BI-RADS Category 2. - Her blood work was within normal limits.  She will come back in 6 months for follow-up.  We will arrange for mammogram in September. -I talked about benefits of extending aromatase inhibitor therapy beyond 5 years.  She is agreeable to this.  2.  Osteoporosis: - DEXA scan in 2015 showed osteoporosis.  She was started on Prolia 60 mg every 6 months.  She is tolerating it very well. -Repeat DEXA scan in December  2017 shows improvement to osteopenia. -She will continue calcium pills with vitamin D twice daily.     No orders of the defined types were placed in this encounter.  The patient has a good understanding of the overall plan. she agrees with it. she will call with any problems that may develop before the next visit here.   Derek Jack, MD 12/18/17

## 2017-12-18 NOTE — Progress Notes (Signed)
Patient tolerated prolia shot with no complaints voiced.  Injection site clean and dry with no bruising or swelling noted at site.  Band aid applied.  Patient stated she is taking her calcium as directed.  Denied jaw, tooth, or leg pain.  No recent or upcoming dental visits.  VSS with discharge and left ambulatory with no s/s of distress noted.

## 2017-12-18 NOTE — Telephone Encounter (Signed)
PC TO [employer] [place] TO [place]. PER [employer] AUTH IS NOT NEEDED FOR PROLIA [id number] DX M81.0  [id number]#[id number]

## 2017-12-18 NOTE — Assessment & Plan Note (Addendum)
1.  Stage I (T1b N0) left breast cancer: - Status post lumpectomy on 04/20/2014, 0.9 cm IDC, grade 1, ER/PR positive, HER-2 negative, Ki-67 of 10% - Oncotype DX score of 7, underwent radiation therapy, started on anastrozole - Tolerating anastrozole very well.  No hot flashes or musculoskeletal symptoms.  Today physical examination shows lumpectomy scar in the upper inner quadrant of the left breast has some scar thickening which is stable.  No palpable mass in bilateral breast.  Last mammogram was on 04/14/2017, BI-RADS Category 2. - Her blood work was within normal limits.  She will come back in 6 months for follow-up.  We will arrange for mammogram in September. -I talked about benefits of extending aromatase inhibitor therapy beyond 5 years.  She is agreeable to this.  2.  Osteoporosis: - DEXA scan in 2015 showed osteoporosis.  She was started on Prolia 60 mg every 6 months.  She is tolerating it very well. -Repeat DEXA scan in December 2017 shows improvement to osteopenia. -She will continue calcium pills with vitamin D twice daily.

## 2017-12-18 NOTE — Patient Instructions (Signed)
[facility] at [facility]  Discharge Instructions:  You received a prolia shot today.   _______________________________________________________________  Thank you for choosing [facility] at [facility] to provide your oncology and hematology care.  To afford each patient quality time with our providers, please arrive at least 15 minutes before your scheduled appointment.  You need to re-schedule your appointment if you arrive 10 or more minutes late.  We strive to give you quality time with our providers, and arriving late affects you and other patients whose appointments are after yours.  Also, if you no show three or more times for appointments you may be dismissed from the clinic.  Again, thank you for choosing [facility] at [facility]. Our hope is that these requests will allow you access to exceptional care and in a timely manner. _______________________________________________________________  If you have questions after your visit, please contact our office at [phone] between the hours of 8:30 a.m. and 5:00 p.m. Voicemails left after 4:30 p.m. will not be returned until the following business day. _______________________________________________________________  For prescription refill requests, have your pharmacy contact our office. _______________________________________________________________  Recommendations made by the consultant and any test results will be sent to your referring physician. _______________________________________________________________ 

## 2017-12-27 DIAGNOSIS — C50919 Malignant neoplasm of unspecified site of unspecified female breast: Secondary | ICD-10-CM | POA: Diagnosis not present

## 2017-12-27 DIAGNOSIS — E7849 Other hyperlipidemia: Secondary | ICD-10-CM | POA: Diagnosis not present

## 2017-12-27 DIAGNOSIS — R7301 Impaired fasting glucose: Secondary | ICD-10-CM | POA: Diagnosis not present

## 2017-12-27 DIAGNOSIS — E890 Postprocedural hypothyroidism: Secondary | ICD-10-CM | POA: Diagnosis not present

## 2017-12-27 DIAGNOSIS — E0510 Thyrotoxicosis with toxic single thyroid nodule without thyrotoxic crisis or storm: Secondary | ICD-10-CM | POA: Diagnosis not present

## 2017-12-27 DIAGNOSIS — F064 Anxiety disorder due to known physiological condition: Secondary | ICD-10-CM | POA: Diagnosis not present

## 2017-12-28 LAB — LIPID PANEL
CHOL/HDL RATIO: 4.1 ratio (ref 0.0–4.4)
Cholesterol, Total: 229 mg/dL — ABNORMAL HIGH (ref 100–199)
HDL: 56 mg/dL (ref >39)
LDL Calculated: 142 mg/dL — ABNORMAL HIGH (ref 0–99)
TRIGLYCERIDES: 154 mg/dL — AB (ref 0–149)
VLDL Cholesterol Cal: 31 mg/dL (ref 5–40)

## 2017-12-28 LAB — GLUCOSE, RANDOM: Glucose: 112 mg/dL — ABNORMAL HIGH (ref 65–99)

## 2017-12-29 ENCOUNTER — Encounter: Payer: Self-pay | Admitting: Family Medicine

## 2018-01-02 DIAGNOSIS — E890 Postprocedural hypothyroidism: Secondary | ICD-10-CM | POA: Diagnosis not present

## 2018-01-02 DIAGNOSIS — C50919 Malignant neoplasm of unspecified site of unspecified female breast: Secondary | ICD-10-CM | POA: Diagnosis not present

## 2018-01-02 DIAGNOSIS — D34 Benign neoplasm of thyroid gland: Secondary | ICD-10-CM | POA: Diagnosis not present

## 2018-01-02 DIAGNOSIS — E785 Hyperlipidemia, unspecified: Secondary | ICD-10-CM | POA: Diagnosis not present

## 2018-03-06 ENCOUNTER — Other Ambulatory Visit: Payer: Self-pay | Admitting: Adult Health

## 2018-03-06 DIAGNOSIS — Z853 Personal history of malignant neoplasm of breast: Secondary | ICD-10-CM

## 2018-04-16 ENCOUNTER — Ambulatory Visit
Admission: RE | Admit: 2018-04-16 | Discharge: 2018-04-16 | Disposition: A | Discharge status: Home / Self Care | Payer: BLUE CROSS/BLUE SHIELD | Source: Ambulatory Visit | Attending: Adult Health | Admitting: Adult Health

## 2018-04-16 DIAGNOSIS — R922 Inconclusive mammogram: Secondary | ICD-10-CM | POA: Diagnosis not present

## 2018-04-16 DIAGNOSIS — Z853 Personal history of malignant neoplasm of breast: Secondary | ICD-10-CM

## 2018-05-11 ENCOUNTER — Telehealth: Payer: Self-pay | Admitting: Family Medicine

## 2018-05-11 NOTE — Telephone Encounter (Signed)
Fax from [facility] [place]. Nurse filled out as much as possible. In provider office.

## 2018-05-16 ENCOUNTER — Other Ambulatory Visit: Payer: Self-pay | Admitting: Family Medicine

## 2018-05-16 NOTE — Telephone Encounter (Signed)
Formed filled in the rest that I could now it ids ready

## 2018-05-18 NOTE — Telephone Encounter (Signed)
Faxed the information to the plan.

## 2018-05-29 ENCOUNTER — Other Ambulatory Visit (HOSPITAL_COMMUNITY)
Admission: RE | Admit: 2018-05-29 | Discharge: 2018-05-29 | Disposition: A | Discharge status: Home / Self Care | Payer: BLUE CROSS/BLUE SHIELD | Source: Ambulatory Visit | Attending: Adult Health | Admitting: Adult Health

## 2018-05-29 ENCOUNTER — Ambulatory Visit (INDEPENDENT_AMBULATORY_CARE_PROVIDER_SITE_OTHER): Payer: BLUE CROSS/BLUE SHIELD | Admitting: Adult Health

## 2018-05-29 ENCOUNTER — Encounter: Payer: Self-pay | Admitting: Adult Health

## 2018-05-29 VITALS — BP 199/108 | HR 90 | Ht 64.0 in | Wt 190.0 lb

## 2018-05-29 DIAGNOSIS — I10 Essential (primary) hypertension: Secondary | ICD-10-CM

## 2018-05-29 DIAGNOSIS — Z1212 Encounter for screening for malignant neoplasm of rectum: Secondary | ICD-10-CM | POA: Diagnosis not present

## 2018-05-29 DIAGNOSIS — Z01419 Encounter for gynecological examination (general) (routine) without abnormal findings: Secondary | ICD-10-CM

## 2018-05-29 DIAGNOSIS — Z1211 Encounter for screening for malignant neoplasm of colon: Secondary | ICD-10-CM

## 2018-05-29 DIAGNOSIS — Z853 Personal history of malignant neoplasm of breast: Secondary | ICD-10-CM

## 2018-05-29 LAB — HEMOCCULT GUIAC POC 1CARD (OFFICE): FECAL OCCULT BLD: NEGATIVE

## 2018-05-29 MED ORDER — HYDROCHLOROTHIAZIDE 12.5 MG PO CAPS: 12.5000 mg | ORAL_CAPSULE | Freq: Every day | ORAL | 1 refills | Status: DC

## 2018-05-29 MED ORDER — LORAZEPAM 0.5 MG PO TABS: ORAL_TABLET | ORAL | 0 refills | Status: DC

## 2018-05-29 NOTE — Progress Notes (Signed)
Patient ID: [name], female   DOB: 02-12-1956, 62 y.o.   MRN: [id number] History of Present Illness:  Beth Hamilton is a 62 year old white female, separated in for well woman gyn exam and pap. PCP is Beth Hamilton.  Current Medications, Allergies, Past Medical History, Past Surgical History, Family History and Social History were reviewed in [employer] record.     Review of Systems: Patient denies any headaches, hearing loss, fatigue, blurred vision, shortness of breath, chest pain, abdominal pain, problems with bowel movements, urination, or intercourse.(not having sex) No joint pain or mood swings. She is going to [place] in December and requests meds for flight to reduce anxiety.    Physical Exam:BP (!) 184/118 (BP Location: Right Arm, Patient Position: Sitting, Cuff Size: Normal)   Pulse 90   Ht 5' 4"  (1.626 m)   Wt 190 lb (86.2 kg)   BMI 32.61 kg/m BP recheck 199/108 General:  Well developed, well nourished, no acute distress Skin:  Warm and dry Neck:  Midline trachea, normal thyroid, good ROM, no lymphadenopathy Lungs; Clear to auscultation bilaterally Breast:  No dominant palpable mass, retraction, or nipple discharge Cardiovascular: Regular rate and rhythm Abdomen:  Soft, non tender, no hepatosplenomegaly Pelvic:  External genitalia is normal in appearance, no lesions.  The vagina is pale with loss of color, moisture and rugae. Urethra has no lesions or masses. The cervix is smooth, pap with HPV performed.  Uterus is felt to be normal size, shape, and contour.  No adnexal masses or tenderness noted.Bladder is non tender, no masses felt. Rectal: Good sphincter tone, no polyps, or hemorrhoids felt.  Hemoccult negative. Extremities/musculoskeletal:  No swelling or varicosities noted, no clubbing or cyanosis Psych:  No mood changes, alert and cooperative,seems happy PHQ 9 score 0. Examination chaperoned by Beth Hamilton CMA. Will add microzide to lisinopril to see  if helps BP has appt with Beth Hamilton 11/21   Impression: 1. Encounter for gynecological examination with Papanicolaou smear of cervix   2. Screening for colorectal cancer   3. History of breast cancer   4. Hypertension, unspecified type       Plan: Meds ordered this encounter  Medications  . hydrochlorothiazide (MICROZIDE) 12.5 MG capsule    Sig: Take 1 capsule (12.5 mg total) by mouth daily.    Dispense:  30 capsule    Refill:  1    Order Specific Question:   Supervising Provider    Answer:   Beth Hamilton, Beth Hamilton [[id number]]  . LORazepam (ATIVAN) 0.5 MG tablet    Sig: Take when gets to airport and can repeat in 4 hours if needed    Dispense:  10 tablet    Refill:  0    Order Specific Question:   Supervising Provider    Answer:   Beth Hamilton [[id number]]  Physical in 1 year Pap in 3 if normal Mammogram yearly Labs with PCP  Colonoscopy per GI

## 2018-06-01 LAB — CYTOLOGY - PAP
DIAGNOSIS: NEGATIVE
HPV (WINDOPATH): NOT DETECTED

## 2018-06-11 ENCOUNTER — Encounter: Payer: Self-pay | Admitting: Family Medicine

## 2018-06-11 ENCOUNTER — Ambulatory Visit: Payer: BLUE CROSS/BLUE SHIELD | Admitting: Family Medicine

## 2018-06-11 VITALS — BP 132/80 | Ht 64.0 in | Wt 198.4 lb

## 2018-06-11 DIAGNOSIS — I10 Essential (primary) hypertension: Secondary | ICD-10-CM

## 2018-06-11 DIAGNOSIS — E7849 Other hyperlipidemia: Secondary | ICD-10-CM

## 2018-06-11 MED ORDER — LISINOPRIL 20 MG PO TABS: ORAL_TABLET | ORAL | 5 refills | Status: DC

## 2018-06-11 NOTE — Patient Instructions (Signed)
DASH Eating Plan DASH stands for "Dietary Approaches to Stop Hypertension." The DASH eating plan is a healthy eating plan that has been shown to reduce high blood pressure (hypertension). It may also reduce your risk for type 2 diabetes, heart disease, and stroke. The DASH eating plan may also help with weight loss. What are tips for following this plan? General guidelines  Avoid eating more than 2,300 mg (milligrams) of salt (sodium) a day. If you have hypertension, you may need to reduce your sodium intake to 1,500 mg a day.  Limit alcohol intake to no more than 1 drink a day for nonpregnant women and 2 drinks a day for men. One drink equals 12 oz of beer, 5 oz of wine, or 1 oz of hard liquor.  Work with your health care provider to maintain a healthy body weight or to lose weight. Ask what an ideal weight is for you.  Get at least 30 minutes of exercise that causes your heart to beat faster (aerobic exercise) most days of the week. Activities may include walking, swimming, or biking.  Work with your health care provider or diet and nutrition specialist (dietitian) to adjust your eating plan to your individual calorie needs. Reading food labels  Check food labels for the amount of sodium per serving. Choose foods with less than 5 percent of the Daily Value of sodium. Generally, foods with less than 300 mg of sodium per serving fit into this eating plan.  To find whole grains, look for the word "whole" as the first word in the ingredient list. Shopping  Buy products labeled as "low-sodium" or "no salt added."  Buy fresh foods. Avoid canned foods and premade or frozen meals. Cooking  Avoid adding salt when cooking. Use salt-free seasonings or herbs instead of table salt or sea salt. Check with your health care provider or pharmacist before using salt substitutes.  Do not fry foods. Cook foods using healthy methods such as baking, boiling, grilling, and broiling instead.  Cook with  heart-healthy oils, such as olive, canola, soybean, or sunflower oil. Meal planning   Eat a balanced diet that includes: ? 5 or more servings of fruits and vegetables each day. At each meal, try to fill half of your plate with fruits and vegetables. ? Up to 6-8 servings of whole grains each day. ? Less than 6 oz of lean meat, poultry, or fish each day. A 3-oz serving of meat is about the same size as a deck of cards. One egg equals 1 oz. ? 2 servings of low-fat dairy each day. ? A serving of nuts, seeds, or beans 5 times each week. ? Heart-healthy fats. Healthy fats called Omega-3 fatty acids are found in foods such as flaxseeds and coldwater fish, like sardines, salmon, and mackerel.  Limit how much you eat of the following: ? Canned or prepackaged foods. ? Food that is high in trans fat, such as fried foods. ? Food that is high in saturated fat, such as fatty meat. ? Sweets, desserts, sugary drinks, and other foods with added sugar. ? Full-fat dairy products.  Do not salt foods before eating.  Try to eat at least 2 vegetarian meals each week.  Eat more home-cooked food and less restaurant, buffet, and fast food.  When eating at a restaurant, ask that your food be prepared with less salt or no salt, if possible. What foods are recommended? The items listed may not be a complete list. Talk with your dietitian about what   dietary choices are best for you. Grains Whole-grain or whole-wheat bread. Whole-grain or whole-wheat pasta. Brown rice. Oatmeal. Quinoa. Bulgur. Whole-grain and low-sodium cereals. Pita bread. Low-fat, low-sodium crackers. Whole-wheat flour tortillas. Vegetables Fresh or frozen vegetables (raw, steamed, roasted, or grilled). Low-sodium or reduced-sodium tomato and vegetable juice. Low-sodium or reduced-sodium tomato sauce and tomato paste. Low-sodium or reduced-sodium canned vegetables. Fruits All fresh, dried, or frozen fruit. Canned fruit in natural juice (without  added sugar). Meat and other protein foods Skinless chicken or turkey. Ground chicken or turkey. Pork with fat trimmed off. Fish and seafood. Egg whites. Dried beans, peas, or lentils. Unsalted nuts, nut butters, and seeds. Unsalted canned beans. Lean cuts of beef with fat trimmed off. Low-sodium, lean deli meat. Dairy Low-fat (1%) or fat-free (skim) milk. Fat-free, low-fat, or reduced-fat cheeses. Nonfat, low-sodium ricotta or cottage cheese. Low-fat or nonfat yogurt. Low-fat, low-sodium cheese. Fats and oils Soft margarine without trans fats. Vegetable oil. Low-fat, reduced-fat, or light mayonnaise and salad dressings (reduced-sodium). Canola, safflower, olive, soybean, and sunflower oils. Avocado. Seasoning and other foods Herbs. Spices. Seasoning mixes without salt. Unsalted popcorn and pretzels. Fat-free sweets. What foods are not recommended? The items listed may not be a complete list. Talk with your dietitian about what dietary choices are best for you. Grains Baked goods made with fat, such as croissants, muffins, or some breads. Dry pasta or rice meal packs. Vegetables Creamed or fried vegetables. Vegetables in a cheese sauce. Regular canned vegetables (not low-sodium or reduced-sodium). Regular canned tomato sauce and paste (not low-sodium or reduced-sodium). Regular tomato and vegetable juice (not low-sodium or reduced-sodium). Pickles. Olives. Fruits Canned fruit in a light or heavy syrup. Fried fruit. Fruit in cream or butter sauce. Meat and other protein foods Fatty cuts of meat. Ribs. Fried meat. Bacon. Sausage. Bologna and other processed lunch meats. Salami. Fatback. Hotdogs. Bratwurst. Salted nuts and seeds. Canned beans with added salt. Canned or smoked fish. Whole eggs or egg yolks. Chicken or turkey with skin. Dairy Whole or 2% milk, cream, and half-and-half. Whole or full-fat cream cheese. Whole-fat or sweetened yogurt. Full-fat cheese. Nondairy creamers. Whipped toppings.  Processed cheese and cheese spreads. Fats and oils Butter. Stick margarine. Lard. Shortening. Ghee. Bacon fat. Tropical oils, such as coconut, palm kernel, or palm oil. Seasoning and other foods Salted popcorn and pretzels. Onion salt, garlic salt, seasoned salt, table salt, and sea salt. Worcestershire sauce. Tartar sauce. Barbecue sauce. Teriyaki sauce. Soy sauce, including reduced-sodium. Steak sauce. Canned and packaged gravies. Fish sauce. Oyster sauce. Cocktail sauce. Horseradish that you find on the shelf. Ketchup. Mustard. Meat flavorings and tenderizers. Bouillon cubes. Hot sauce and Tabasco sauce. Premade or packaged marinades. Premade or packaged taco seasonings. Relishes. Regular salad dressings. Where to find more information:  National Heart, Lung, and Blood Institute: www.nhlbi.nih.gov  American Heart Association: www.heart.org Summary  The DASH eating plan is a healthy eating plan that has been shown to reduce high blood pressure (hypertension). It may also reduce your risk for type 2 diabetes, heart disease, and stroke.  With the DASH eating plan, you should limit salt (sodium) intake to 2,300 mg a day. If you have hypertension, you may need to reduce your sodium intake to 1,500 mg a day.  When on the DASH eating plan, aim to eat more fresh fruits and vegetables, whole grains, lean proteins, low-fat dairy, and heart-healthy fats.  Work with your health care provider or diet and nutrition specialist (dietitian) to adjust your eating plan to your individual   calorie needs. This information is not intended to replace advice given to you by your health care provider. Make sure you discuss any questions you have with your health care provider. Document Released: 06/27/2011 Document Revised: 07/01/2016 Document Reviewed: 07/01/2016 Elsevier Interactive Patient Education  2018 [employer] Inc.  

## 2018-06-11 NOTE — Progress Notes (Signed)
   Subjective:    Patient ID: Beth Hamilton, female    DOB: 09-05-1955, 62 y.o.   MRN: [id number]  Hypertension  This is a chronic problem. The current episode started more than 1 year ago. Pertinent negatives include no chest pain, headaches or shortness of breath. Risk factors for coronary artery disease include post-menopausal state. Treatments tried: lisinopril 20 mg. There are no compliance problems.   Patient does try to watch her diet Not able to exercise because of her time demands Denies being depressed   No problems or concerns per patient  Review of Systems  Constitutional: Negative for activity change, fatigue and fever.  HENT: Negative for congestion and rhinorrhea.   Respiratory: Negative for cough, chest tightness and shortness of breath.   Cardiovascular: Negative for chest pain and leg swelling.  Gastrointestinal: Negative for abdominal pain and nausea.  Skin: Negative for color change.  Neurological: Negative for dizziness and headaches.  Psychiatric/Behavioral: Negative for agitation and behavioral problems.       Objective:   Physical Exam  Constitutional: She appears well-developed and well-nourished. No distress.  HENT:  Head: Normocephalic and atraumatic.  Eyes: Right eye exhibits no discharge. Left eye exhibits no discharge.  Neck: No tracheal deviation present.  Cardiovascular: Normal rate, regular rhythm and normal heart sounds.  No murmur heard. Pulmonary/Chest: Effort normal and breath sounds normal. No respiratory distress. She has no wheezes. She has no rales.  Musculoskeletal: She exhibits no edema.  Lymphadenopathy:    She has no cervical adenopathy.  Neurological: She is alert. She exhibits normal muscle tone.  Skin: Skin is warm and dry. No erythema.  Psychiatric: Her behavior is normal.  Vitals reviewed.   Patient will be going to [place] with her son she is excited about that      Assessment & Plan:  HTN- Patient was seen  today as part of a visit regarding hypertension. The importance of healthy diet and regular physical activity was discussed. The importance of compliance with medications discussed.  Ideal goal is to keep blood pressure low elevated levels certainly below 834/37 when possible.  The patient was counseled that keeping blood pressure under control lessen his risk of complications.  The importance of regular follow-ups was discussed with the patient.  Low-salt diet such as DASH recommended.  Regular physical activity was recommended as well.  Patient was advised to keep regular follow-ups.  Patient's cholesterol is been up in the past she will watch her diet closely check her lab work she will try to fit in some exercise and eat healthy  Follow-up 6 months

## 2018-06-13 DIAGNOSIS — E7849 Other hyperlipidemia: Secondary | ICD-10-CM | POA: Diagnosis not present

## 2018-06-13 DIAGNOSIS — I10 Essential (primary) hypertension: Secondary | ICD-10-CM | POA: Diagnosis not present

## 2018-06-14 ENCOUNTER — Encounter: Payer: Self-pay | Admitting: Family Medicine

## 2018-06-14 DIAGNOSIS — R7301 Impaired fasting glucose: Secondary | ICD-10-CM | POA: Insufficient documentation

## 2018-06-14 LAB — HEPATIC FUNCTION PANEL
ALK PHOS: 45 IU/L (ref 39–117)
ALT: 24 IU/L (ref 0–32)
AST: 30 IU/L (ref 0–40)
Albumin: 4.6 g/dL (ref 3.5–5.5)
BILIRUBIN TOTAL: 0.5 mg/dL (ref 0.0–1.2)
BILIRUBIN, DIRECT: 0.12 mg/dL (ref 0.00–0.40)
TOTAL PROTEIN: 7.1 g/dL (ref 6.0–8.5)

## 2018-06-14 LAB — BASIC METABOLIC PANEL
BUN/Creatinine Ratio: 20 (ref 9–23)
BUN: 17 mg/dL (ref 6–24)
CALCIUM: 9.5 mg/dL (ref 8.7–10.2)
CHLORIDE: 100 mmol/L (ref 96–106)
CO2: 25 mmol/L (ref 20–29)
Creatinine, Ser: 0.86 mg/dL (ref 0.57–1.00)
GFR calc Af Amer: 86 mL/min/{1.73_m2} (ref >59)
GFR, EST NON AFRICAN AMERICAN: 74 mL/min/{1.73_m2} (ref >59)
Glucose: 116 mg/dL — ABNORMAL HIGH (ref 65–99)
POTASSIUM: 5 mmol/L (ref 3.5–5.2)
SODIUM: 142 mmol/L (ref 134–144)

## 2018-06-14 LAB — LIPID PANEL
CHOL/HDL RATIO: 4.4 ratio (ref 0.0–4.4)
Cholesterol, Total: 230 mg/dL — ABNORMAL HIGH (ref 100–199)
HDL: 52 mg/dL (ref >39)
LDL Calculated: 150 mg/dL — ABNORMAL HIGH (ref 0–99)
TRIGLYCERIDES: 140 mg/dL (ref 0–149)
VLDL Cholesterol Cal: 28 mg/dL (ref 5–40)

## 2018-06-16 ENCOUNTER — Other Ambulatory Visit (HOSPITAL_COMMUNITY): Payer: BLUE CROSS/BLUE SHIELD

## 2018-06-22 ENCOUNTER — Other Ambulatory Visit (HOSPITAL_COMMUNITY): Payer: Self-pay

## 2018-06-22 DIAGNOSIS — Z170 Estrogen receptor positive status [ER+]: Principal | ICD-10-CM

## 2018-06-22 DIAGNOSIS — C50512 Malignant neoplasm of lower-outer quadrant of left female breast: Secondary | ICD-10-CM

## 2018-06-23 ENCOUNTER — Inpatient Hospital Stay (HOSPITAL_COMMUNITY): Payer: BLUE CROSS/BLUE SHIELD

## 2018-06-23 ENCOUNTER — Encounter (HOSPITAL_COMMUNITY): Payer: Self-pay | Admitting: Hematology

## 2018-06-23 ENCOUNTER — Inpatient Hospital Stay (HOSPITAL_COMMUNITY): Payer: BLUE CROSS/BLUE SHIELD | Attending: Hematology | Admitting: Hematology

## 2018-06-23 ENCOUNTER — Other Ambulatory Visit (HOSPITAL_COMMUNITY): Payer: Self-pay | Admitting: Pharmacist

## 2018-06-23 ENCOUNTER — Other Ambulatory Visit: Payer: Self-pay

## 2018-06-23 VITALS — BP 157/89 | HR 93 | Temp 98.2°F | Resp 16 | Wt 200.0 lb

## 2018-06-23 DIAGNOSIS — C50512 Malignant neoplasm of lower-outer quadrant of left female breast: Secondary | ICD-10-CM | POA: Insufficient documentation

## 2018-06-23 DIAGNOSIS — M810 Age-related osteoporosis without current pathological fracture: Secondary | ICD-10-CM | POA: Insufficient documentation

## 2018-06-23 DIAGNOSIS — Z79811 Long term (current) use of aromatase inhibitors: Secondary | ICD-10-CM | POA: Diagnosis not present

## 2018-06-23 DIAGNOSIS — Z923 Personal history of irradiation: Secondary | ICD-10-CM | POA: Diagnosis not present

## 2018-06-23 DIAGNOSIS — Z87891 Personal history of nicotine dependence: Secondary | ICD-10-CM | POA: Diagnosis not present

## 2018-06-23 DIAGNOSIS — Z170 Estrogen receptor positive status [ER+]: Principal | ICD-10-CM

## 2018-06-23 LAB — CBC WITH DIFFERENTIAL/PLATELET
Abs Immature Granulocytes: 0.02 10*3/uL (ref 0.00–0.07)
Basophils Absolute: 0 10*3/uL (ref 0.0–0.1)
Basophils Relative: 1 %
EOS PCT: 1 %
Eosinophils Absolute: 0.1 10*3/uL (ref 0.0–0.5)
HCT: 43.1 % (ref 36.0–46.0)
HEMOGLOBIN: 13.6 g/dL (ref 12.0–15.0)
Immature Granulocytes: 0 %
LYMPHS PCT: 25 %
Lymphs Abs: 1.9 10*3/uL (ref 0.7–4.0)
MCH: 33.6 pg (ref 26.0–34.0)
MCHC: 31.6 g/dL (ref 30.0–36.0)
MCV: 106.4 fL — AB (ref 80.0–100.0)
MONOS PCT: 6 %
Monocytes Absolute: 0.4 10*3/uL (ref 0.1–1.0)
NEUTROS ABS: 5 10*3/uL (ref 1.7–7.7)
Neutrophils Relative %: 67 %
Platelets: 216 10*3/uL (ref 150–400)
RBC: 4.05 MIL/uL (ref 3.87–5.11)
RDW: 12.9 % (ref 11.5–15.5)
WBC: 7.5 10*3/uL (ref 4.0–10.5)
nRBC: 0 % (ref 0.0–0.2)

## 2018-06-23 LAB — COMPREHENSIVE METABOLIC PANEL
ALK PHOS: 41 U/L (ref 38–126)
ALT: 31 U/L (ref 0–44)
AST: 37 U/L (ref 15–41)
Albumin: 4.1 g/dL (ref 3.5–5.0)
Anion gap: 10 (ref 5–15)
BILIRUBIN TOTAL: 0.9 mg/dL (ref 0.3–1.2)
BUN: 17 mg/dL (ref 6–20)
CALCIUM: 9.1 mg/dL (ref 8.9–10.3)
CO2: 28 mmol/L (ref 22–32)
CREATININE: 0.96 mg/dL (ref 0.44–1.00)
Chloride: 100 mmol/L (ref 98–111)
GFR calc Af Amer: >60 mL/min (ref >60)
Glucose, Bld: 182 mg/dL — ABNORMAL HIGH (ref 70–99)
Potassium: 4.1 mmol/L (ref 3.5–5.1)
Sodium: 138 mmol/L (ref 135–145)
TOTAL PROTEIN: 7.3 g/dL (ref 6.5–8.1)

## 2018-06-23 MED ORDER — ANASTROZOLE 1 MG PO TABS: 1.0000 mg | ORAL_TABLET | Freq: Every day | ORAL | 5 refills | Status: DC

## 2018-06-23 MED ORDER — DENOSUMAB 60 MG/ML ~~LOC~~ SOSY
60.0000 mg | PREFILLED_SYRINGE | Freq: Once | SUBCUTANEOUS | Status: AC
  Administered 2018-06-23: 60 mg via SUBCUTANEOUS
  Filled 2018-06-23: qty 1

## 2018-06-23 NOTE — Patient Instructions (Signed)
[place] at [facility] Discharge Instructions  Received Prolia injection today. Follow-up as scheduled. Call clinic for any questions or concerns   Thank you for choosing [place] at [facility] to provide your oncology and hematology care.  To afford each patient quality time with our provider, please arrive at least 15 minutes before your scheduled appointment time.   If you have a lab appointment with the [facility] please come in thru the  Main Entrance and check in at the main information desk  You need to re-schedule your appointment should you arrive 10 or more minutes late.  We strive to give you quality time with our providers, and arriving late affects you and other patients whose appointments are after yours.  Also, if you no show three or more times for appointments you may be dismissed from the clinic at the providers discretion.     Again, thank you for choosing [facility].  Our hope is that these requests will decrease the amount of time that you wait before being seen by our physicians.       _____________________________________________________________  Should you have questions after your visit to [facility], please contact our office at [phone] between the hours of 8:00 a.m. and 4:30 p.m.  Voicemails left after 4:00 p.m. will not be returned until the following business day.  For prescription refill requests, have your pharmacy contact our office and allow 72 hours.    Cancer Center Support Programs:   > Cancer Support Group  2nd Tuesday of the month 1pm-2pm, Journey Room

## 2018-06-23 NOTE — Patient Instructions (Signed)
[place] at [facility] Discharge Instructions  You were seen today by Dr. Delton Coombes   Thank you for choosing [place] at [facility] to provide your oncology and hematology care.  To afford each patient quality time with our provider, please arrive at least 15 minutes before your scheduled appointment time.   If you have a lab appointment with the [facility] please come in thru the  Main Entrance and check in at the main information desk  You need to re-schedule your appointment should you arrive 10 or more minutes late.  We strive to give you quality time with our providers, and arriving late affects you and other patients whose appointments are after yours.  Also, if you no show three or more times for appointments you may be dismissed from the clinic at the providers discretion.     Again, thank you for choosing [facility].  Our hope is that these requests will decrease the amount of time that you wait before being seen by our physicians.       _____________________________________________________________  Should you have questions after your visit to [facility], please contact our office at [phone] between the hours of 8:00 a.m. and 4:30 p.m.  Voicemails left after 4:00 p.m. will not be returned until the following business day.  For prescription refill requests, have your pharmacy contact our office and allow 72 hours.    Cancer Center Support Programs:   > Cancer Support Group  2nd Tuesday of the month 1pm-2pm, Journey Room

## 2018-06-23 NOTE — Assessment & Plan Note (Signed)
1.  Stage I (T1b N0) left breast cancer: - Status post lumpectomy on 04/20/2014, 0.9 cm IDC, grade 1, ER/PR positive, HER-2 negative, Ki-67 of 10% - Oncotype DX score of 7, underwent radiation therapy, started on anastrozole - She is continuing to tolerate anastrozole very well. -She denies any major hot flashes or musculoskeletal symptoms.  Her mammogram dated 04/16/2018 shows BI-RADS Category 2. -Today's physical examination did not reveal any palpable masses.  Lumpectomy scar in the upper inner quadrant is well-healed. - We will see her back in 6 months for follow-up.  She will be switched to once a year visit after September 2020.  2.  Osteoporosis: - Bone density test in 2015 showed osteoporosis.  She was started on Prolia every 6 months on 12/30/2014.  She is tolerating it very well. - She was told to continue calcium and vitamin D twice daily. -Repeat DEXA scan in December 2017 showed improvement in osteopenia. - I plan to repeat bone density test in 2020.

## 2018-06-23 NOTE — Progress Notes (Signed)
[place], [place] [id number]   CLINIC:  Medical Oncology/Hematology  PCP:  [name], MD [place] [address] [phone]   REASON FOR VISIT: Follow-up for left breast cancer, ER+/PR+/HER2-  CURRENT THERAPY: anastrozole  BRIEF ONCOLOGIC HISTORY:    Breast cancer of lower-outer quadrant of left female breast ([facility])   05/24/2014 Initial Diagnosis    Breast cancer of lower-outer quadrant of left female breast    06/22/2014 - 08/05/2014 Radiation Therapy    Left breast 50 Gy at 2 Gy per fraction x 25 fractions with left breast boost 10 Gy at 2 Gy per fraction x 5 fractions by Dr. Pablo Ledger.      CANCER STAGING: Cancer Staging Breast cancer of lower-outer quadrant of left female breast [facility]) Staging form: Breast, AJCC 7th Edition - Clinical: No stage assigned - Unsigned    INTERVAL HISTORY:  Ms. Gillentine 62 y.o. female returns for routine follow-up for left breast cancer. She is here today and doing well. She has no complaints at this time. Denies any hot flashes. Denies any nausea, vomiting, or diarrhea. Denies any skin rashes or mouth sores. Denies any bleeding or easy bruising. She reports her appetite and energy level is 100%. She has no problem maintaining her weight.     REVIEW OF SYSTEMS:  Review of Systems  All other systems reviewed and are negative.    PAST MEDICAL/SURGICAL HISTORY:  Past Medical History:  Diagnosis Date  . Breast cancer ([facility]) 04/20/14   left breast  . Complication of anesthesia    not sure what med was given in 96 with D&C, was very sore all over. Dr Patsey Berthold did anesthesia. Dr Heide Spark did surgery  . Hemorrhoids   . History of breast cancer 05/19/2015  . Hypertension   . Hypothyroidism   . IBD (inflammatory bowel disease)    [facility]  . Personal history of radiation therapy    2015  . Pneumonia   . Psoriasis   . Thyroid disease   . Ulcerative colitis ([facility]) 5 1 2007   Dr  Gala Romney   Past Surgical History:  Procedure Laterality Date  . BREAST BIOPSY    . BREAST LUMPECTOMY Left 04/20/2014  . COLONOSCOPY  10/09/2001   RMR: Internal hemorrhoids and anal papilla; otherwise normal rectum  . COLONOSCOPY  12/10/2005   incomplete tcs-  colitis  . COLONOSCOPY N/A 04/18/2014   Dr.Rourk- abnormal rectum & L colon into the mid descending segment. mucosa was erythematious as well as pale w/ some loss of normal vascular apttern. no erosions or ulcers. pt had some smooth peduculated 3-4cm polyps in the sigmoid segment most c/w pseudopolyps. the rest of the colonic mucosa appeared normal. bx= inflammatory polyps  . DILATION AND CURETTAGE OF UTERUS  1996   Dr Heide Spark  . FLEXIBLE SIGMOIDOSCOPY  02/26/2006   endoscopically normal-appearing rectum, colitis in sigmoid mucosa  . PARTIAL MASTECTOMY WITH NEEDLE LOCALIZATION AND AXILLARY SENTINEL LYMPH NODE BX Left 04/20/2014   Procedure: PARTIAL MASTECTOMY AFTER NEEDLE LOCALIZATION AND AXILLARY SENTINEL LYMPH NODE BX;  Surgeon: Jamesetta So, MD;  Location: [facility];  Service: General;  Laterality: Left;  . TUBAL LIGATION    . WISDOM TOOTH EXTRACTION       SOCIAL HISTORY:  Social History   Socioeconomic History  . Marital status: Married    Spouse name: Not on file  . Number of children: 1  . Years of education: Not on  file  . Highest education level: Not on file  Occupational History  . Not on file  Social Needs  . Financial resource strain: Not on file  . Food insecurity:    Worry: Not on file    Inability: Not on file  . Transportation needs:    Medical: Not on file    Non-medical: Not on file  Tobacco Use  . Smoking status: Former Smoker    Packs/day: 0.00    Years: 15.00    Pack years: 0.00    Types: Cigarettes    Last attempt to quit: 07/26/2005    Years since quitting: 12.9  . Smokeless tobacco: Never Used  . Tobacco comment: quit in 2007  Substance and Sexual Activity  . Alcohol use: Yes    Comment: wine  every day  . Drug use: No  . Sexual activity: Not Currently    Birth control/protection: Post-menopausal  Lifestyle  . Physical activity:    Days per week: Not on file    Minutes per session: Not on file  . Stress: Not on file  Relationships  . Social connections:    Talks on phone: Not on file    Gets together: Not on file    Attends religious service: Not on file    Active member of club or organization: Not on file    Attends meetings of clubs or organizations: Not on file    Relationship status: Not on file  . Intimate partner violence:    Fear of current or ex partner: Not on file    Emotionally abused: Not on file    Physically abused: Not on file    Forced sexual activity: Not on file  Other Topics Concern  . Not on file  Social History Narrative  . Not on file    FAMILY HISTORY:  Family History  Problem Relation Age of Onset  . Stroke Maternal Grandfather   . Heart attack Maternal Grandfather   . Hypertension Mother   . Diabetes Paternal Grandfather   . Heart disease Paternal Grandfather   . Colon cancer Neg Hx     CURRENT MEDICATIONS:  Outpatient Encounter Medications as of 06/23/2018  Medication Sig  . anastrozole (ARIMIDEX) 1 MG tablet Take 1 tablet (1 mg total) by mouth daily.  Francia Greaves THYROID 30 MG tablet Take 30 mg by mouth daily.   . balsalazide (COLAZAL) 750 MG capsule Take 3 capsules (2,250 mg total) by mouth 3 (three) times daily.  . Calcium Citrate (CITRACAL PO) Take 2 capsules by mouth daily. 1200 mg calcium and Vitamin D 1000 IU  . Cyanocobalamin (VITAMIN B 12 PO) Take by mouth. Sublingual-once daily  . FOLIC ACID PO Take by mouth daily.  . hydrochlorothiazide (MICROZIDE) 12.5 MG capsule Take 1 capsule (12.5 mg total) by mouth daily.  Marland Kitchen lisinopril (PRINIVIL,ZESTRIL) 20 MG tablet TAKE (1) TABLET BY MOUTH ONCE DAILY.  . [DISCONTINUED] anastrozole (ARIMIDEX) 1 MG tablet TAKE ONE TABLET BY MOUTH ONCE DAILY.  Marland Kitchen acyclovir (ZOVIRAX) 400 MG tablet TAKE  (2) TABLETS BY MOUTH TWICE DAILY. (Patient not taking: Reported on 06/23/2018)  . acyclovir ointment (ZOVIRAX) 5 % Apply 1 application topically every 4 (four) hours as needed. (Patient not taking: Reported on 05/29/2018)  . fluticasone (CUTIVATE) 0.05 % cream APPLY TO AFFECTED AREAS TWICE DAILY. (Patient not taking: Reported on 05/29/2018)  . LORazepam (ATIVAN) 0.5 MG tablet Take when gets to airport and can repeat in 4 hours if needed (Patient not taking: Reported  on 06/23/2018)   No facility-administered encounter medications on file as of 06/23/2018.     ALLERGIES:  Allergies  Allergen Reactions  . Flagyl [Metronidazole]     Severe nausea and vomiting  . Other Other (See Comments)    Extreme muscle aches several days after surgery. PT SAID SHE WAS ALLERGIC TO SOME MEDICATION USED DURING A PROCEDURE BY DR Patsey Berthold YEARS AGO. SHE DOES NOT KNOW THE NAME OF THE MEDICATION, BUT SAID THAT IT MADE HER WEAK ALL OVER.   Mack Hook [Levofloxacin In D5w]     Excessive abd pains and mucousy stool     PHYSICAL EXAM:  ECOG Performance status: 1  Vitals:   06/23/18 1424  BP: (!) 157/89  Pulse: 93  Resp: 16  Temp: 98.2 F (36.8 C)  SpO2: 97%   Filed Weights   06/23/18 1424  Weight: 200 lb (90.7 kg)    Physical Exam  Constitutional: She is oriented to person, place, and time. She appears well-developed and well-nourished.  Musculoskeletal: Normal range of motion.  Neurological: She is alert and oriented to person, place, and time.  Skin: Skin is warm and dry.  Psychiatric: She has a normal mood and affect. Her behavior is normal. Judgment and thought content normal.  Breast: No palpable masses, no skin changes or nipple discharge, no adenopathy.   LABORATORY DATA:  I have reviewed the labs as listed.  CBC    Component Value Date/Time   WBC 7.5 06/23/2018 1346   RBC 4.05 06/23/2018 1346   HGB 13.6 06/23/2018 1346   HGB 11.3 04/26/2015 0815   HCT 43.1 06/23/2018 1346   HCT 34.9  04/26/2015 0815   PLT 216 06/23/2018 1346   PLT 520 (H) 04/26/2015 0815   MCV 106.4 (H) 06/23/2018 1346   MCV 95 04/26/2015 0815   MCH 33.6 06/23/2018 1346   MCHC 31.6 06/23/2018 1346   RDW 12.9 06/23/2018 1346   RDW 12.6 04/26/2015 0815   LYMPHSABS 1.9 06/23/2018 1346   LYMPHSABS 1.0 04/26/2015 0815   MONOABS 0.4 06/23/2018 1346   EOSABS 0.1 06/23/2018 1346   EOSABS 0.2 04/26/2015 0815   BASOSABS 0.0 06/23/2018 1346   BASOSABS 0.0 04/26/2015 0815   CMP Latest Ref Rng & Units 06/23/2018 06/13/2018 12/27/2017  Glucose 70 - 99 mg/dL 182(H) 116(H) 112(H)  BUN 6 - 20 mg/dL 17 17 -  Creatinine 0.44 - 1.00 mg/dL 0.96 0.86 -  Sodium 135 - 145 mmol/L 138 142 -  Potassium 3.5 - 5.1 mmol/L 4.1 5.0 -  Chloride 98 - 111 mmol/L 100 100 -  CO2 22 - 32 mmol/L 28 25 -  Calcium 8.9 - 10.3 mg/dL 9.1 9.5 -  Total Protein 6.5 - 8.1 g/dL 7.3 7.1 -  Total Bilirubin 0.3 - 1.2 mg/dL 0.9 0.5 -  Alkaline Phos 38 - 126 U/L 41 45 -  AST 15 - 41 U/L 37 30 -  ALT 0 - 44 U/L 31 24 -       DIAGNOSTIC IMAGING:  I have independently reviewed the scans and discussed with the patient.  I have reviewed Francene Finders, NP's note and agree with the documentation.  I personally performed a face-to-face visit, made revisions and my assessment and plan is as follows.     ASSESSMENT & PLAN:   Breast cancer of lower-outer quadrant of left female breast 1.  Stage I (T1b N0) left breast cancer: - Status post lumpectomy on 04/20/2014, 0.9 cm IDC, grade 1, ER/PR positive, HER-2  negative, Ki-67 of 10% - Oncotype DX score of 7, underwent radiation therapy, started on anastrozole - She is continuing to tolerate anastrozole very well. -She denies any major hot flashes or musculoskeletal symptoms.  Her mammogram dated 04/16/2018 shows BI-RADS Category 2. -Today's physical examination did not reveal any palpable masses.  Lumpectomy scar in the upper inner quadrant is well-healed. - We will see her back in 6 months for  follow-up.  She will be switched to once a year visit after September 2020.  2.  Osteoporosis: - Bone density test in 2015 showed osteoporosis.  She was started on Prolia every 6 months on 12/30/2014.  She is tolerating it very well. - She was told to continue calcium and vitamin D twice daily. -Repeat DEXA scan in December 2017 showed improvement in osteopenia. - I plan to repeat bone density test in 2020.        Orders placed this encounter:  Orders Placed This Encounter  Procedures  . CBC with Differential/Platelet  . Comprehensive metabolic panel  . Draw extra clot tube      Derek Jack, MD [place] [phone]

## 2018-06-23 NOTE — Progress Notes (Signed)
Beth Hamilton tolerated Prolia injection well without complaints or incident. Calcium 9.1 today and pt denies any tooth or jaw pain and no recent or future dental visits. Pt continues to take her Calcium PO as prescribed. Pt discharged self ambulatory in satisfactory condition

## 2018-07-24 ENCOUNTER — Other Ambulatory Visit: Payer: Self-pay | Admitting: Adult Health

## 2018-07-30 DIAGNOSIS — E042 Nontoxic multinodular goiter: Secondary | ICD-10-CM | POA: Diagnosis not present

## 2018-07-30 DIAGNOSIS — E890 Postprocedural hypothyroidism: Secondary | ICD-10-CM | POA: Diagnosis not present

## 2018-10-24 ENCOUNTER — Other Ambulatory Visit: Payer: Self-pay | Admitting: Adult Health

## 2018-11-13 ENCOUNTER — Other Ambulatory Visit: Payer: Self-pay | Admitting: Family Medicine

## 2018-11-20 ENCOUNTER — Other Ambulatory Visit: Payer: Self-pay | Admitting: Gastroenterology

## 2018-12-11 ENCOUNTER — Ambulatory Visit: Payer: BLUE CROSS/BLUE SHIELD | Admitting: Family Medicine

## 2018-12-18 ENCOUNTER — Ambulatory Visit (INDEPENDENT_AMBULATORY_CARE_PROVIDER_SITE_OTHER): Payer: BLUE CROSS/BLUE SHIELD | Admitting: Family Medicine

## 2018-12-18 ENCOUNTER — Other Ambulatory Visit: Payer: Self-pay

## 2018-12-18 ENCOUNTER — Ambulatory Visit: Payer: BLUE CROSS/BLUE SHIELD | Admitting: Family Medicine

## 2018-12-18 DIAGNOSIS — E038 Other specified hypothyroidism: Secondary | ICD-10-CM

## 2018-12-18 DIAGNOSIS — I10 Essential (primary) hypertension: Secondary | ICD-10-CM

## 2018-12-18 MED ORDER — LISINOPRIL 20 MG PO TABS: ORAL_TABLET | ORAL | 1 refills | Status: DC

## 2018-12-18 MED ORDER — FLUTICASONE PROPIONATE 0.05 % EX CREA: TOPICAL_CREAM | CUTANEOUS | 1 refills | Status: DC

## 2018-12-18 NOTE — Progress Notes (Signed)
   Subjective:    Patient ID: Beth Hamilton, female    DOB: 10/07/1955, 63 y.o.   MRN: [id number]  Hypertension  This is a chronic problem. The current episode started more than 1 year ago. Pertinent negatives include no chest pain, headaches or shortness of breath. Risk factors for coronary artery disease include post-menopausal state. Treatments tried: lisinopril, hctz.   Patient would like refill of Fluticosone 0.05% cream  Virtual Visit via Video Note  I connected with Beth Hamilton on 12/18/18 at  9:00 AM EDT by a video enabled telemedicine application and verified that I am speaking with the correct person using two identifiers.  Location: Patient: home Provider: office   I discussed the limitations of evaluation and management by telemedicine and the availability of in person appointments. The patient expressed understanding and agreed to proceed.  History of Present Illness:    Observations/Objective:   Assessment and Plan:   Follow Up Instructions:    I discussed the assessment and treatment plan with the patient. The patient was provided an opportunity to ask questions and all were answered. The patient agreed with the plan and demonstrated an understanding of the instructions.   The patient was advised to call back or seek an in-person evaluation if the symptoms worsen or if the condition fails to improve as anticipated.  I provided 15 minutes of non-face-to-face time during this encounter.      Review of Systems  Constitutional: Negative for activity change, fatigue and fever.  HENT: Negative for congestion and rhinorrhea.   Respiratory: Negative for cough, chest tightness and shortness of breath.   Cardiovascular: Negative for chest pain and leg swelling.  Gastrointestinal: Negative for abdominal pain and nausea.  Skin: Negative for color change.  Neurological: Negative for dizziness and headaches.  Psychiatric/Behavioral: Negative for agitation and  behavioral problems.       Objective:   Physical Exam  Patient had virtual visit Appears to be in no distress Atraumatic Neuro able to relate and oriented No apparent resp distress Color normal       Assessment & Plan:  HTN- Patient was seen today as part of a visit regarding hypertension. The importance of healthy diet and regular physical activity was discussed. The importance of compliance with medications discussed.  Ideal goal is to keep blood pressure low elevated levels certainly below 035/24 when possible.  The patient was counseled that keeping blood pressure under control lessen his risk of complications.  The importance of regular follow-ups was discussed with the patient.  Low-salt diet such as DASH recommended.  Regular physical activity was recommended as well.  Patient was advised to keep regular follow-ups.  Her thyroid issue that is followed by endocrinology Dr. Ronnald Collum  She is followed by Beth Hamilton for 24-monthcheckups had breast cancer 5 years ago  No additional lab work necessary currently we will do lab work in the fall depending on parameters  Patient does get Prolia from the cancer center every 6 months

## 2018-12-22 ENCOUNTER — Other Ambulatory Visit: Payer: Self-pay

## 2018-12-23 ENCOUNTER — Encounter (HOSPITAL_COMMUNITY): Payer: Self-pay | Admitting: Hematology

## 2018-12-23 ENCOUNTER — Other Ambulatory Visit (HOSPITAL_COMMUNITY): Payer: Self-pay | Admitting: Hematology

## 2018-12-23 ENCOUNTER — Inpatient Hospital Stay (HOSPITAL_COMMUNITY): Payer: BC Managed Care – PPO

## 2018-12-23 ENCOUNTER — Other Ambulatory Visit (HOSPITAL_COMMUNITY): Payer: BLUE CROSS/BLUE SHIELD

## 2018-12-23 ENCOUNTER — Inpatient Hospital Stay (HOSPITAL_COMMUNITY): Payer: BC Managed Care – PPO | Attending: Hematology | Admitting: Hematology

## 2018-12-23 VITALS — BP 182/99 | HR 100 | Temp 98.7°F | Resp 18 | Wt 197.1 lb

## 2018-12-23 DIAGNOSIS — Z170 Estrogen receptor positive status [ER+]: Secondary | ICD-10-CM

## 2018-12-23 DIAGNOSIS — C50512 Malignant neoplasm of lower-outer quadrant of left female breast: Secondary | ICD-10-CM

## 2018-12-23 DIAGNOSIS — E039 Hypothyroidism, unspecified: Secondary | ICD-10-CM

## 2018-12-23 DIAGNOSIS — M810 Age-related osteoporosis without current pathological fracture: Secondary | ICD-10-CM | POA: Insufficient documentation

## 2018-12-23 DIAGNOSIS — M818 Other osteoporosis without current pathological fracture: Secondary | ICD-10-CM

## 2018-12-23 DIAGNOSIS — Z87891 Personal history of nicotine dependence: Secondary | ICD-10-CM

## 2018-12-23 LAB — CBC WITH DIFFERENTIAL/PLATELET
Abs Immature Granulocytes: 0.03 10*3/uL (ref 0.00–0.07)
Basophils Absolute: 0 10*3/uL (ref 0.0–0.1)
Basophils Relative: 0 %
Eosinophils Absolute: 0.1 10*3/uL (ref 0.0–0.5)
Eosinophils Relative: 1 %
HCT: 42 % (ref 36.0–46.0)
Hemoglobin: 13.3 g/dL (ref 12.0–15.0)
Immature Granulocytes: 0 %
Lymphocytes Relative: 25 %
Lymphs Abs: 1.8 10*3/uL (ref 0.7–4.0)
MCH: 33.6 pg (ref 26.0–34.0)
MCHC: 31.7 g/dL (ref 30.0–36.0)
MCV: 106.1 fL — ABNORMAL HIGH (ref 80.0–100.0)
Monocytes Absolute: 0.6 10*3/uL (ref 0.1–1.0)
Monocytes Relative: 9 %
Neutro Abs: 4.6 10*3/uL (ref 1.7–7.7)
Neutrophils Relative %: 65 %
Platelets: 209 10*3/uL (ref 150–400)
RBC: 3.96 MIL/uL (ref 3.87–5.11)
RDW: 13.4 % (ref 11.5–15.5)
WBC: 7.1 10*3/uL (ref 4.0–10.5)
nRBC: 0 % (ref 0.0–0.2)

## 2018-12-23 LAB — COMPREHENSIVE METABOLIC PANEL
ALT: 28 U/L (ref 0–44)
AST: 33 U/L (ref 15–41)
Albumin: 4.1 g/dL (ref 3.5–5.0)
Alkaline Phosphatase: 40 U/L (ref 38–126)
Anion gap: 10 (ref 5–15)
BUN: 20 mg/dL (ref 6–20)
CO2: 28 mmol/L (ref 22–32)
Calcium: 9.6 mg/dL (ref 8.9–10.3)
Chloride: 101 mmol/L (ref 98–111)
Creatinine, Ser: 0.93 mg/dL (ref 0.44–1.00)
GFR calc Af Amer: >60 mL/min (ref >60)
GFR calc non Af Amer: >60 mL/min (ref >60)
Glucose, Bld: 109 mg/dL — ABNORMAL HIGH (ref 70–99)
Potassium: 4.3 mmol/L (ref 3.5–5.1)
Sodium: 139 mmol/L (ref 135–145)
Total Bilirubin: 1.1 mg/dL (ref 0.3–1.2)
Total Protein: 7 g/dL (ref 6.5–8.1)

## 2018-12-23 MED ORDER — DENOSUMAB 60 MG/ML ~~LOC~~ SOSY
PREFILLED_SYRINGE | SUBCUTANEOUS | Status: AC
  Filled 2018-12-23: qty 1

## 2018-12-23 MED ORDER — DENOSUMAB 60 MG/ML ~~LOC~~ SOSY
60.0000 mg | PREFILLED_SYRINGE | Freq: Once | SUBCUTANEOUS | Status: AC
  Administered 2018-12-23: 60 mg via SUBCUTANEOUS

## 2018-12-23 NOTE — Progress Notes (Signed)
Patient taking calcium as directed.  Denied tooth, jaw, or leg pain.  Patient tolerated injection with no complaints voiced.  Site clean and dry with no bruising or swelling noted at site.  Band aid applied.  Vss with discharge and left ambulatory with no s/s of distress noted.

## 2018-12-23 NOTE — Progress Notes (Signed)
[place], [place] [address]   CLINIC:  Medical Oncology/Hematology  PCP:  [name], MD [name] [id number] [phone]   REASON FOR VISIT:  Follow-up for breast cancer   BRIEF ONCOLOGIC HISTORY:    Breast cancer of lower-outer quadrant of left female breast ([place])   05/24/2014 Initial Diagnosis    Breast cancer of lower-outer quadrant of left female breast    06/22/2014 - 08/05/2014 Radiation Therapy    Left breast 50 Gy at 2 Gy per fraction x 25 fractions with left breast boost 10 Gy at 2 Gy per fraction x 5 fractions by Dr. Pablo Ledger.      CANCER STAGING: Cancer Staging Breast cancer of lower-outer quadrant of left female breast [facility]) Staging form: Breast, AJCC 7th Edition - Clinical: No stage assigned - Unsigned    INTERVAL HISTORY:  [name] 63 y.o. female returns for routine follow-up. She is here today alone. She states that she has been doing great since her last visit.  Denies any nausea, vomiting, or diarrhea. Denies any new pains. Had not noticed any recent bleeding such as epistaxis, hematuria or hematochezia. Denies recent chest pain on exertion, shortness of breath on minimal exertion, pre-syncopal episodes, or palpitations. Denies any numbness or tingling in hands or feet. Denies any recent fevers, infections, or recent hospitalizations. Patient reports appetite at 100% and energy level at 100%.   REVIEW OF SYSTEMS:  Review of Systems  All other systems reviewed and are negative.    PAST MEDICAL/SURGICAL HISTORY:  Past Medical History:  Diagnosis Date  . Breast cancer ([place]) 04/20/14   left breast  . Complication of anesthesia    not sure what med was given in 96 with D&C, was very sore all over. Dr Patsey Berthold did anesthesia. Dr Heide Spark did surgery  . Hemorrhoids   . History of breast cancer 05/19/2015  . Hypertension   . Hypothyroidism   . IBD (inflammatory bowel disease)    UC   . Personal history of radiation therapy    2015  . Pneumonia   . Psoriasis   . Thyroid disease   . Ulcerative colitis ([place]) 5 1 2007   Dr Gala Romney   Past Surgical History:  Procedure Laterality Date  . BREAST BIOPSY    . BREAST LUMPECTOMY Left 04/20/2014  . COLONOSCOPY  10/09/2001   RMR: Internal hemorrhoids and anal papilla; otherwise normal rectum  . COLONOSCOPY  12/10/2005   incomplete tcs-  colitis  . COLONOSCOPY N/A 04/18/2014   Dr.Rourk- abnormal rectum & L colon into the mid descending segment. mucosa was erythematious as well as pale w/ some loss of normal vascular apttern. no erosions or ulcers. pt had some smooth peduculated 3-4cm polyps in the sigmoid segment most c/w pseudopolyps. the rest of the colonic mucosa appeared normal. bx= inflammatory polyps  . DILATION AND CURETTAGE OF UTERUS  1996   Dr Heide Spark  . FLEXIBLE SIGMOIDOSCOPY  02/26/2006   endoscopically normal-appearing rectum, colitis in sigmoid mucosa  . PARTIAL MASTECTOMY WITH NEEDLE LOCALIZATION AND AXILLARY SENTINEL LYMPH NODE BX Left 04/20/2014   Procedure: PARTIAL MASTECTOMY AFTER NEEDLE LOCALIZATION AND AXILLARY SENTINEL LYMPH NODE BX;  Surgeon: Jamesetta So, MD;  Location: [facility];  Service: General;  Laterality: Left;  . TUBAL LIGATION    . WISDOM TOOTH EXTRACTION       SOCIAL HISTORY:  Social History   Socioeconomic History  . Marital status: Married  Spouse name: Not on file  . Number of children: 1  . Years of education: Not on file  . Highest education level: Not on file  Occupational History  . Not on file  Social Needs  . Financial resource strain: Not on file  . Food insecurity:    Worry: Not on file    Inability: Not on file  . Transportation needs:    Medical: Not on file    Non-medical: Not on file  Tobacco Use  . Smoking status: Former Smoker    Packs/day: 0.00    Years: 15.00    Pack years: 0.00    Types: Cigarettes    Last attempt to quit: 07/26/2005    Years since quitting:  13.4  . Smokeless tobacco: Never Used  . Tobacco comment: quit in 2007  Substance and Sexual Activity  . Alcohol use: Yes    Comment: wine every day  . Drug use: No  . Sexual activity: Not Currently    Birth control/protection: Post-menopausal  Lifestyle  . Physical activity:    Days per week: Not on file    Minutes per session: Not on file  . Stress: Not on file  Relationships  . Social connections:    Talks on phone: Not on file    Gets together: Not on file    Attends religious service: Not on file    Active member of club or organization: Not on file    Attends meetings of clubs or organizations: Not on file    Relationship status: Not on file  . Intimate partner violence:    Fear of current or ex partner: Not on file    Emotionally abused: Not on file    Physically abused: Not on file    Forced sexual activity: Not on file  Other Topics Concern  . Not on file  Social History Narrative  . Not on file    FAMILY HISTORY:  Family History  Problem Relation Age of Onset  . Stroke Maternal Grandfather   . Heart attack Maternal Grandfather   . Hypertension Mother   . Diabetes Paternal Grandfather   . Heart disease Paternal Grandfather   . Colon cancer Neg Hx     CURRENT MEDICATIONS:  Outpatient Encounter Medications as of 12/23/2018  Medication Sig  . acyclovir (ZOVIRAX) 400 MG tablet TAKE (2) TABLETS BY MOUTH TWICE DAILY. (Patient not taking: Reported on 06/23/2018)  . acyclovir ointment (ZOVIRAX) 5 % Apply 1 application topically every 4 (four) hours as needed. (Patient not taking: Reported on 05/29/2018)  . anastrozole (ARIMIDEX) 1 MG tablet Take 1 tablet (1 mg total) by mouth daily.  Beth Hamilton THYROID 60 MG tablet   . balsalazide (COLAZAL) 750 MG capsule TAKE 3 CAPSULES BY MOUTH THREE TIMES DAILY.  . Calcium Citrate (CITRACAL PO) Take 2 capsules by mouth daily. 1200 mg calcium and Vitamin D 1000 IU  . Cyanocobalamin (VITAMIN B 12 PO) Take by mouth. Sublingual-once  daily  . fluticasone (CUTIVATE) 0.05 % cream APPLY TO AFFECTED AREAS TWICE DAILY.  Marland Kitchen FOLIC ACID PO Take by mouth daily.  . hydrochlorothiazide (MICROZIDE) 12.5 MG capsule TAKE (1) CAPSULE BY MOUTH ONCE DAILY.  Marland Kitchen lisinopril (ZESTRIL) 20 MG tablet TAKE ONE TABLET BY MOUTH ONCE DAILY.  Marland Kitchen LORazepam (ATIVAN) 0.5 MG tablet Take when gets to airport and can repeat in 4 hours if needed (Patient not taking: Reported on 06/23/2018)  . [DISCONTINUED] ARMOUR THYROID 30 MG tablet Take 30 mg by mouth  daily.    No facility-administered encounter medications on file as of 12/23/2018.     ALLERGIES:  Allergies  Allergen Reactions  . Flagyl [Metronidazole]     Severe nausea and vomiting  . Other Other (See Comments)    Extreme muscle aches several days after surgery. PT SAID SHE WAS ALLERGIC TO SOME MEDICATION USED DURING A PROCEDURE BY DR Patsey Berthold YEARS AGO. SHE DOES NOT KNOW THE NAME OF THE MEDICATION, BUT SAID THAT IT MADE HER WEAK ALL OVER.   Mack Hook [Levofloxacin In D5w]     Excessive abd pains and mucousy stool     PHYSICAL EXAM:  ECOG Performance status: 0  Vitals:   12/23/18 1400  BP: (!) 182/99  Pulse: 100  Resp: 18  Temp: 98.7 F (37.1 C)  SpO2: 100%   Filed Weights   12/23/18 1400  Weight: 197 lb 2 oz (89.4 kg)    Physical Exam Vitals signs reviewed.  Constitutional:      Appearance: Normal appearance.  Cardiovascular:     Rate and Rhythm: Normal rate and regular rhythm.     Heart sounds: Normal heart sounds.  Pulmonary:     Effort: Pulmonary effort is normal.     Breath sounds: Normal breath sounds.  Abdominal:     General: There is no distension.     Palpations: Abdomen is soft. There is no mass.  Musculoskeletal:        General: No swelling.  Skin:    General: Skin is warm.  Neurological:     General: No focal deficit present.     Mental Status: She is alert and oriented to person, place, and time.  Psychiatric:        Mood and Affect: Mood normal.         Behavior: Behavior normal.      LABORATORY DATA:  I have reviewed the labs as listed.  CBC    Component Value Date/Time   WBC 7.1 12/23/2018 1426   RBC 3.96 12/23/2018 1426   HGB 13.3 12/23/2018 1426   HGB 11.3 04/26/2015 0815   HCT 42.0 12/23/2018 1426   HCT 34.9 04/26/2015 0815   PLT 209 12/23/2018 1426   PLT 520 (H) 04/26/2015 0815   MCV 106.1 (H) 12/23/2018 1426   MCV 95 04/26/2015 0815   MCH 33.6 12/23/2018 1426   MCHC 31.7 12/23/2018 1426   RDW 13.4 12/23/2018 1426   RDW 12.6 04/26/2015 0815   LYMPHSABS 1.8 12/23/2018 1426   LYMPHSABS 1.0 04/26/2015 0815   MONOABS 0.6 12/23/2018 1426   EOSABS 0.1 12/23/2018 1426   EOSABS 0.2 04/26/2015 0815   BASOSABS 0.0 12/23/2018 1426   BASOSABS 0.0 04/26/2015 0815   CMP Latest Ref Rng & Units 12/23/2018 06/23/2018 06/13/2018  Glucose 70 - 99 mg/dL 109(H) 182(H) 116(H)  BUN 6 - 20 mg/dL 20 17 17   Creatinine 0.44 - 1.00 mg/dL 0.93 0.96 0.86  Sodium 135 - 145 mmol/L 139 138 142  Potassium 3.5 - 5.1 mmol/L 4.3 4.1 5.0  Chloride 98 - 111 mmol/L 101 100 100  CO2 22 - 32 mmol/L 28 28 25   Calcium 8.9 - 10.3 mg/dL 9.6 9.1 9.5  Total Protein 6.5 - 8.1 g/dL 7.0 7.3 7.1  Total Bilirubin 0.3 - 1.2 mg/dL 1.1 0.9 0.5  Alkaline Phos 38 - 126 U/L 40 41 45  AST 15 - 41 U/L 33 37 30  ALT 0 - 44 U/L 28 31 24  DIAGNOSTIC IMAGING:  I have independently reviewed the scans and discussed with the patient.   I have reviewed Venita Lick LPN's note and agree with the documentation.  I personally performed a face-to-face visit, made revisions and my assessment and plan is as follows.    ASSESSMENT & PLAN:   Breast cancer of lower-outer quadrant of left female breast 1 stage I (T1BN0) left breast cancer: -Status post left lumpectomy on 04/20/2014, 0.9 cm IDC, grade 1, ER/PR positive, HER-2 negative, Ki-67 of 10%. -Oncotype DX score of 7, underwent radiation therapy, started on anastrozole. -She is tolerating anastrozole very well.   Denies any hot flashes or musculoskeletal symptoms. -Physical exam today did not show any abnormalities.  Lumpectomy site in the upper inner quadrant is within normal limits. -Last mammogram on 04/16/2018 shows BI-RADS Category 2. - We will schedule her mammogram and follow-up visit in October.  After that I will switch her to once a year visits.  2.  Osteoporosis: - DEXA scan in 2015 showed osteoporosis. As she was started on Prolia every 6 months on 12/30/2014.  He is tolerating it very well.  She will continue calcium and vitamin D twice daily. -Repeat DEXA scan on December 2017 shows improvement in osteopenia. -I will schedule her bone density test prior to next visit in October.       Orders placed this encounter:  Orders Placed This Encounter  Procedures  . MM DIAG BREAST TOMO BILATERAL  . DG Bone Density  . CBC with Differential/Platelet  . Comprehensive metabolic panel  . Vitamin D 25 hydroxy      Derek Jack, MD [place] [phone]

## 2018-12-23 NOTE — Assessment & Plan Note (Signed)
1 stage I (T1BN0) left breast cancer: -Status post left lumpectomy on 04/20/2014, 0.9 cm IDC, grade 1, ER/PR positive, HER-2 negative, Ki-67 of 10%. -Oncotype DX score of 7, underwent radiation therapy, started on anastrozole. -She is tolerating anastrozole very well.  Denies any hot flashes or musculoskeletal symptoms. -Physical exam today did not show any abnormalities.  Lumpectomy site in the upper inner quadrant is within normal limits. -Last mammogram on 04/16/2018 shows BI-RADS Category 2. - We will schedule her mammogram and follow-up visit in October.  After that I will switch her to once a year visits.  2.  Osteoporosis: - DEXA scan in 2015 showed osteoporosis. As she was started on Prolia every 6 months on 12/30/2014.  He is tolerating it very well.  She will continue calcium and vitamin D twice daily. -Repeat DEXA scan on December 2017 shows improvement in osteopenia. -I will schedule her bone density test prior to next visit in October.

## 2018-12-23 NOTE — Patient Instructions (Addendum)
[facility] at [facility] Discharge Instructions  You were seen today by Dr. Katragadda. He went over your recent lab results. He will see you back in 4 months for labs and follow up.   Thank you for choosing [facility] at [facility] to provide your oncology and hematology care.  To afford each patient quality time with our provider, please arrive at least 15 minutes before your scheduled appointment time.   If you have a lab appointment with the Cancer Center please come in thru the  Main Entrance and check in at the main information desk  You need to re-schedule your appointment should you arrive 10 or more minutes late.  We strive to give you quality time with our providers, and arriving late affects you and other patients whose appointments are after yours.  Also, if you no show three or more times for appointments you may be dismissed from the clinic at the providers discretion.     Again, thank you for choosing  Cancer Center.  Our hope is that these requests will decrease the amount of time that you wait before being seen by our physicians.       _____________________________________________________________  Should you have questions after your visit to  Cancer Center, please contact our office at [phone] between the hours of 8:00 a.m. and 4:30 p.m.  Voicemails left after 4:00 p.m. will not be returned until the following business day.  For prescription refill requests, have your pharmacy contact our office and allow 72 hours.    Cancer Center Support Programs:   > Cancer Support Group  2nd Tuesday of the month 1pm-2pm, Journey Room    

## 2018-12-25 ENCOUNTER — Other Ambulatory Visit (HOSPITAL_COMMUNITY): Payer: Self-pay | Admitting: Nurse Practitioner

## 2018-12-25 DIAGNOSIS — C50512 Malignant neoplasm of lower-outer quadrant of left female breast: Secondary | ICD-10-CM

## 2019-01-21 ENCOUNTER — Other Ambulatory Visit (HOSPITAL_COMMUNITY): Payer: Self-pay | Admitting: Nurse Practitioner

## 2019-01-21 DIAGNOSIS — C50512 Malignant neoplasm of lower-outer quadrant of left female breast: Secondary | ICD-10-CM

## 2019-02-18 ENCOUNTER — Other Ambulatory Visit: Payer: Self-pay | Admitting: Adult Health

## 2019-02-18 ENCOUNTER — Other Ambulatory Visit (HOSPITAL_COMMUNITY): Payer: Self-pay | Admitting: Nurse Practitioner

## 2019-02-18 DIAGNOSIS — C50512 Malignant neoplasm of lower-outer quadrant of left female breast: Secondary | ICD-10-CM

## 2019-03-25 ENCOUNTER — Other Ambulatory Visit (HOSPITAL_COMMUNITY): Payer: Self-pay | Admitting: Nurse Practitioner

## 2019-03-25 DIAGNOSIS — Z170 Estrogen receptor positive status [ER+]: Secondary | ICD-10-CM

## 2019-03-25 DIAGNOSIS — C50512 Malignant neoplasm of lower-outer quadrant of left female breast: Secondary | ICD-10-CM

## 2019-04-05 ENCOUNTER — Encounter: Payer: Self-pay | Admitting: Internal Medicine

## 2019-04-22 ENCOUNTER — Other Ambulatory Visit (HOSPITAL_COMMUNITY): Payer: Self-pay | Admitting: Nurse Practitioner

## 2019-04-22 DIAGNOSIS — Z170 Estrogen receptor positive status [ER+]: Secondary | ICD-10-CM

## 2019-04-22 DIAGNOSIS — C50512 Malignant neoplasm of lower-outer quadrant of left female breast: Secondary | ICD-10-CM

## 2019-04-27 ENCOUNTER — Inpatient Hospital Stay (HOSPITAL_COMMUNITY): Payer: BC Managed Care – PPO | Attending: Hematology

## 2019-04-27 ENCOUNTER — Ambulatory Visit (HOSPITAL_COMMUNITY)
Admission: RE | Admit: 2019-04-27 | Discharge: 2019-04-27 | Disposition: A | Discharge status: Home / Self Care | Payer: BC Managed Care – PPO | Source: Ambulatory Visit | Attending: Hematology | Admitting: Hematology

## 2019-04-27 ENCOUNTER — Encounter: Payer: Self-pay | Admitting: Internal Medicine

## 2019-04-27 ENCOUNTER — Ambulatory Visit (HOSPITAL_COMMUNITY): Payer: BC Managed Care – PPO

## 2019-04-27 ENCOUNTER — Ambulatory Visit (HOSPITAL_COMMUNITY): Admission: RE | Admit: 2019-04-27 | Payer: BC Managed Care – PPO | Source: Ambulatory Visit

## 2019-04-27 ENCOUNTER — Other Ambulatory Visit: Payer: Self-pay

## 2019-04-27 ENCOUNTER — Ambulatory Visit: Payer: BC Managed Care – PPO | Admitting: Internal Medicine

## 2019-04-27 VITALS — BP 158/86 | HR 94 | Temp 96.9°F | Ht 64.0 in | Wt 194.4 lb

## 2019-04-27 DIAGNOSIS — K523 Indeterminate colitis: Secondary | ICD-10-CM

## 2019-04-27 DIAGNOSIS — Z923 Personal history of irradiation: Secondary | ICD-10-CM | POA: Diagnosis not present

## 2019-04-27 DIAGNOSIS — C50512 Malignant neoplasm of lower-outer quadrant of left female breast: Secondary | ICD-10-CM | POA: Diagnosis not present

## 2019-04-27 DIAGNOSIS — C50912 Malignant neoplasm of unspecified site of left female breast: Secondary | ICD-10-CM | POA: Diagnosis not present

## 2019-04-27 DIAGNOSIS — R922 Inconclusive mammogram: Secondary | ICD-10-CM | POA: Diagnosis not present

## 2019-04-27 DIAGNOSIS — Z170 Estrogen receptor positive status [ER+]: Secondary | ICD-10-CM | POA: Diagnosis not present

## 2019-04-27 DIAGNOSIS — M810 Age-related osteoporosis without current pathological fracture: Secondary | ICD-10-CM | POA: Diagnosis not present

## 2019-04-27 DIAGNOSIS — M818 Other osteoporosis without current pathological fracture: Secondary | ICD-10-CM | POA: Diagnosis not present

## 2019-04-27 DIAGNOSIS — Z79811 Long term (current) use of aromatase inhibitors: Secondary | ICD-10-CM | POA: Insufficient documentation

## 2019-04-27 DIAGNOSIS — M85852 Other specified disorders of bone density and structure, left thigh: Secondary | ICD-10-CM | POA: Diagnosis not present

## 2019-04-27 LAB — CBC WITH DIFFERENTIAL/PLATELET
Abs Immature Granulocytes: 0.02 10*3/uL (ref 0.00–0.07)
Basophils Absolute: 0 10*3/uL (ref 0.0–0.1)
Basophils Relative: 0 %
Eosinophils Absolute: 0.1 10*3/uL (ref 0.0–0.5)
Eosinophils Relative: 2 %
HCT: 44.4 % (ref 36.0–46.0)
Hemoglobin: 13.9 g/dL (ref 12.0–15.0)
Immature Granulocytes: 0 %
Lymphocytes Relative: 25 %
Lymphs Abs: 1.5 10*3/uL (ref 0.7–4.0)
MCH: 34.1 pg — ABNORMAL HIGH (ref 26.0–34.0)
MCHC: 31.3 g/dL (ref 30.0–36.0)
MCV: 108.8 fL — ABNORMAL HIGH (ref 80.0–100.0)
Monocytes Absolute: 0.4 10*3/uL (ref 0.1–1.0)
Monocytes Relative: 6 %
Neutro Abs: 4.1 10*3/uL (ref 1.7–7.7)
Neutrophils Relative %: 67 %
Platelets: 232 10*3/uL (ref 150–400)
RBC: 4.08 MIL/uL (ref 3.87–5.11)
RDW: 13.2 % (ref 11.5–15.5)
WBC: 6.1 10*3/uL (ref 4.0–10.5)
nRBC: 0 % (ref 0.0–0.2)

## 2019-04-27 LAB — COMPREHENSIVE METABOLIC PANEL
ALT: 26 U/L (ref 0–44)
AST: 31 U/L (ref 15–41)
Albumin: 4 g/dL (ref 3.5–5.0)
Alkaline Phosphatase: 39 U/L (ref 38–126)
Anion gap: 9 (ref 5–15)
BUN: 18 mg/dL (ref 6–20)
CO2: 29 mmol/L (ref 22–32)
Calcium: 8.9 mg/dL (ref 8.9–10.3)
Chloride: 100 mmol/L (ref 98–111)
Creatinine, Ser: 0.77 mg/dL (ref 0.44–1.00)
GFR calc Af Amer: >60 mL/min (ref >60)
GFR calc non Af Amer: >60 mL/min (ref >60)
Glucose, Bld: 185 mg/dL — ABNORMAL HIGH (ref 70–99)
Potassium: 4.4 mmol/L (ref 3.5–5.1)
Sodium: 138 mmol/L (ref 135–145)
Total Bilirubin: 0.9 mg/dL (ref 0.3–1.2)
Total Protein: 7.1 g/dL (ref 6.5–8.1)

## 2019-04-27 LAB — VITAMIN D 25 HYDROXY (VIT D DEFICIENCY, FRACTURES): Vit D, 25-Hydroxy: 50.51 ng/mL (ref 30–100)

## 2019-04-27 NOTE — Patient Instructions (Addendum)
Schedule a surveillance colonoscopy - propofol; hx of indeterminate colitis  Continue Colazal  daily  Further recommendations to follow

## 2019-04-27 NOTE — Progress Notes (Signed)
Primary Care Physician:  [name], MD Primary Gastroenterologist:  Dr. Gala Romney  Pre-Procedure History & Physical: HPI:  Beth Hamilton is a 63 y.o. 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  Last colonoscopy 5 years ago.  Relatively inactive disease with pseudopolyp formation.  She currently takes Colazal with excellent control of her symptoms.  Past Medical History:  Diagnosis Date  . Breast cancer ([place]) 04/20/14   left breast  . Complication of anesthesia    not sure what med was given in 96 with D&C, was very sore all over. Dr Patsey Berthold did anesthesia. Dr Heide Spark did surgery  . Hemorrhoids   . History of breast cancer 05/19/2015  . Hypertension   . Hypothyroidism   . IBD (inflammatory bowel disease)    UC  . Personal history of radiation therapy    2015  . Pneumonia   . Psoriasis   . Thyroid disease   . Ulcerative colitis ([place]) 5 1 2007   Dr Gala Romney    Past Surgical History:  Procedure Laterality Date  . BREAST BIOPSY    . BREAST LUMPECTOMY Left 04/20/2014  . COLONOSCOPY  10/09/2001   RMR: Internal hemorrhoids and anal papilla; otherwise normal rectum  . COLONOSCOPY  12/10/2005   incomplete tcs-  colitis  . COLONOSCOPY N/A 04/18/2014   Dr.Kendarrius Tanzi- abnormal rectum & L colon into the mid descending segment. mucosa was erythematious as well as pale w/ some loss of normal vascular apttern. no erosions or ulcers. pt had some smooth peduculated 3-4cm polyps in the sigmoid segment most c/w pseudopolyps. the rest of the colonic mucosa appeared normal. bx= inflammatory polyps  . DILATION AND CURETTAGE OF UTERUS  1996   Dr Heide Spark  . FLEXIBLE SIGMOIDOSCOPY  02/26/2006   endoscopically normal-appearing rectum, colitis in sigmoid mucosa  . PARTIAL MASTECTOMY WITH NEEDLE LOCALIZATION AND AXILLARY SENTINEL LYMPH NODE BX Left 04/20/2014   Procedure: PARTIAL MASTECTOMY AFTER NEEDLE LOCALIZATION AND AXILLARY SENTINEL  LYMPH NODE BX;  Surgeon: Jamesetta So, MD;  Location: [facility];  Service: General;  Laterality: Left;  . TUBAL LIGATION    . WISDOM TOOTH EXTRACTION      Prior to Admission medications   Medication Sig Start Date End Date Taking? Authorizing Provider  anastrozole (ARIMIDEX) 1 MG tablet TAKE ONE TABLET BY MOUTH ONCE DAILY. 04/22/19  Yes Lockamy, Theresia Lo, NP-C  ARMOUR THYROID 60 MG tablet  11/16/18  Yes [provider]  balsalazide (COLAZAL) 750 MG capsule TAKE 3 CAPSULES BY MOUTH THREE TIMES DAILY. 11/23/18  Yes Annitta Needs, NP  Calcium Citrate (CITRACAL PO) Take 2 capsules by mouth daily. 1200 mg calcium and Vitamin D 1000 IU   Yes [provider]  Cyanocobalamin (VITAMIN B 12 PO) Take by mouth. Sublingual-once daily   Yes [provider]  fluticasone (CUTIVATE) 0.05 % cream APPLY TO AFFECTED AREAS TWICE DAILY. 12/18/18  Yes [name], MD  FOLIC ACID PO Take by mouth daily.   Yes [provider]  hydrochlorothiazide (MICROZIDE) 12.5 MG capsule TAKE (1) CAPSULE BY MOUTH ONCE DAILY. 02/18/19  Yes Derrek Monaco A, NP  lisinopril (ZESTRIL) 20 MG tablet TAKE ONE TABLET BY MOUTH ONCE DAILY. 12/18/18  Yes [name], MD  acyclovir (ZOVIRAX) 400 MG tablet TAKE (2) TABLETS BY MOUTH TWICE DAILY. Patient not taking: Reported on 04/27/2019 07/09/16   Jonnie Kind, MD  acyclovir ointment (ZOVIRAX) 5 % Apply 1 application topically  every 4 (four) hours as needed. Patient not taking: Reported on 05/29/2018 07/09/16   Jonnie Kind, MD  LORazepam (ATIVAN) 0.5 MG tablet Take when gets to airport and can repeat in 4 hours if needed Patient not taking: Reported on 06/23/2018 05/29/18   Estill Dooms, NP    Allergies as of 04/27/2019 - Review Complete 04/27/2019  Allergen Reaction Noted  . Flagyl [metronidazole]  10/29/2013  . Other Other (See Comments) 03/22/2014  . Levaquin [levofloxacin in d5w]  07/23/2013    Family History  Problem Relation Age  of Onset  . Stroke Maternal Grandfather   . Heart attack Maternal Grandfather   . Hypertension Mother   . Diabetes Paternal Grandfather   . Heart disease Paternal Grandfather   . Colon cancer Neg Hx     Social History   Socioeconomic History  . Marital status: Married    Spouse name: Not on file  . Number of children: 1  . Years of education: Not on file  . Highest education level: Not on file  Occupational History  . Not on file  Social Needs  . Financial resource strain: Not on file  . Food insecurity    Worry: Not on file    Inability: Not on file  . Transportation needs    Medical: Not on file    Non-medical: Not on file  Tobacco Use  . Smoking status: Former Smoker    Packs/day: 0.00    Years: 15.00    Pack years: 0.00    Types: Cigarettes    Quit date: 07/26/2005    Years since quitting: 13.7  . Smokeless tobacco: Never Used  . Tobacco comment: quit in 2007  Substance and Sexual Activity  . Alcohol use: Yes    Comment: wine every day  . Drug use: No  . Sexual activity: Not Currently    Birth control/protection: Post-menopausal  Lifestyle  . Physical activity    Days per week: Not on file    Minutes per session: Not on file  . Stress: Not on file  Relationships  . Social Herbalist on phone: Not on file    Gets together: Not on file    Attends religious service: Not on file    Active member of club or organization: Not on file    Attends meetings of clubs or organizations: Not on file    Relationship status: Not on file  . Intimate partner violence    Fear of current or ex partner: Not on file    Emotionally abused: Not on file    Physically abused: Not on file    Forced sexual activity: Not on file  Other Topics Concern  . Not on file  Social History Narrative  . Not on file    Review of Systems: See HPI, otherwise negative ROS  Physical Exam: BP (!) 158/86   Pulse 94   Temp (!) 96.9 F (36.1 C) (Temporal)   Ht 5' 4"  (1.626 m)    Wt 194 lb 6.4 oz (88.2 kg)   BMI 33.37 kg/m  General:   Alert,  Well-developed, well-nourished, pleasant and cooperative in NAD Lungs:  Clear throughout to auscultation.   No wheezes, crackles, or rhonchi. No acute distress. Heart:  Regular rate and rhythm; no murmurs, clicks, rubs,  or gallops. Abdomen: Non-distended, normal bowel sounds.  Soft and nontender without appreciable mass or hepatosplenomegaly.  Pulses:  Normal pulses noted. Extremities:  Without clubbing or edema.  Impression/Plan: 63 year old lady with a approximate 14-year history of left-sided indeterminate colitis.  No dysplasia or significantly active disease on 2015 colonoscopy.  Giving the longstanding inflammatory bowel disease, is appropriate to surveilled her colon at this time. Recent creatinine normal.  Recommendations:  Schedule a surveillance colonoscopy - propofol; hx of indeterminate colitis.  The risks, benefits, limitations, alternatives and imponderables have been reviewed with the patient. Questions have been answered. All parties are agreeable.   Continue Colazal  daily  Further recommendations to follow       Notice: This dictation was prepared with Dragon dictation along with smaller phrase technology. Any transcriptional errors that result from this process are unintentional and may not be corrected upon review.

## 2019-04-28 ENCOUNTER — Other Ambulatory Visit: Payer: Self-pay | Admitting: *Deleted

## 2019-04-28 ENCOUNTER — Telehealth: Payer: Self-pay | Admitting: Internal Medicine

## 2019-04-28 ENCOUNTER — Other Ambulatory Visit: Payer: Self-pay

## 2019-04-28 ENCOUNTER — Telehealth: Payer: Self-pay

## 2019-04-28 DIAGNOSIS — K523 Indeterminate colitis: Secondary | ICD-10-CM

## 2019-04-28 MED ORDER — PEG 3350-KCL-NA BICARB-NACL 420 G PO SOLR: 4000.0000 mL | Freq: Once | ORAL | 0 refills | Status: AC

## 2019-04-28 NOTE — Telephone Encounter (Signed)
Please return pts call. Per pt she needs to be called quickly. [phone]

## 2019-04-28 NOTE — Telephone Encounter (Signed)
Called patient back. She is scheduled for TCS with MAC with RMR 1/11 at 8:30am. Patient aware will mail prep instructions with pre-op/covid-19 testing appt. Rx sent to [facility]. Orders entered.

## 2019-04-28 NOTE — Telephone Encounter (Signed)
Pt was seen yesterday, calling back today to schedule her procedure with RMR> Please call [phone] (if no answer, LMOM)

## 2019-04-28 NOTE — Telephone Encounter (Signed)
Called patient. She needed to know some dates so she can coordinate with her sister in law. Patient will call back to officially schedule

## 2019-04-29 ENCOUNTER — Encounter (HOSPITAL_COMMUNITY): Payer: Self-pay | Admitting: Hematology

## 2019-04-29 ENCOUNTER — Ambulatory Visit (HOSPITAL_COMMUNITY): Payer: BC Managed Care – PPO | Admitting: Hematology

## 2019-04-29 ENCOUNTER — Inpatient Hospital Stay (HOSPITAL_BASED_OUTPATIENT_CLINIC_OR_DEPARTMENT_OTHER): Payer: BC Managed Care – PPO | Admitting: Hematology

## 2019-04-29 ENCOUNTER — Other Ambulatory Visit: Payer: Self-pay

## 2019-04-29 VITALS — BP 153/86 | HR 84 | Temp 97.5°F | Resp 18 | Wt 195.1 lb

## 2019-04-29 DIAGNOSIS — Z170 Estrogen receptor positive status [ER+]: Secondary | ICD-10-CM | POA: Diagnosis not present

## 2019-04-29 DIAGNOSIS — Z79811 Long term (current) use of aromatase inhibitors: Secondary | ICD-10-CM | POA: Diagnosis not present

## 2019-04-29 DIAGNOSIS — C50512 Malignant neoplasm of lower-outer quadrant of left female breast: Secondary | ICD-10-CM | POA: Diagnosis not present

## 2019-04-29 DIAGNOSIS — M818 Other osteoporosis without current pathological fracture: Secondary | ICD-10-CM | POA: Diagnosis not present

## 2019-04-29 DIAGNOSIS — C50912 Malignant neoplasm of unspecified site of left female breast: Secondary | ICD-10-CM | POA: Diagnosis not present

## 2019-04-29 DIAGNOSIS — Z923 Personal history of irradiation: Secondary | ICD-10-CM | POA: Diagnosis not present

## 2019-04-29 MED ORDER — ANASTROZOLE 1 MG PO TABS: 1.0000 mg | ORAL_TABLET | Freq: Every day | ORAL | 12 refills | Status: DC

## 2019-04-29 NOTE — Patient Instructions (Addendum)
[place] at [facility] Discharge Instructions  You were seen today by Dr. Delton Coombes. He went over your recent test results. He will see you back in 1 year for labs and follow up.   Thank you for choosing [place] at [facility] to provide your oncology and hematology care.  To afford each patient quality time with our provider, please arrive at least 15 minutes before your scheduled appointment time.   If you have a lab appointment with the [facility] please come in thru the  Main Entrance and check in at the main information desk  You need to re-schedule your appointment should you arrive 10 or more minutes late.  We strive to give you quality time with our providers, and arriving late affects you and other patients whose appointments are after yours.  Also, if you no show three or more times for appointments you may be dismissed from the clinic at the providers discretion.     Again, thank you for choosing [facility].  Our hope is that these requests will decrease the amount of time that you wait before being seen by our physicians.       _____________________________________________________________  Should you have questions after your visit to [facility], please contact our office at [phone] between the hours of 8:00 a.m. and 4:30 p.m.  Voicemails left after 4:00 p.m. will not be returned until the following business day.  For prescription refill requests, have your pharmacy contact our office and allow 72 hours.    Cancer Center Support Programs:   > Cancer Support Group  2nd Tuesday of the month 1pm-2pm, Journey Room

## 2019-04-29 NOTE — Progress Notes (Signed)
Beth Hamilton, [place] [address]   CLINIC:  Medical Oncology/Hematology  PCP:  Beth Drown, MD [place] [address] [phone]   REASON FOR VISIT:  Follow-up for breast cancer   BRIEF ONCOLOGIC HISTORY:  Oncology History  Breast cancer of lower-outer quadrant of left female breast ([place])  05/24/2014 Initial Diagnosis   Breast cancer of lower-outer quadrant of left female breast   06/22/2014 - 08/05/2014 Radiation Therapy   Left breast 50 Gy at 2 Gy per fraction x 25 fractions with left breast boost 10 Gy at 2 Gy per fraction x 5 fractions by Dr. Pablo Ledger.      CANCER STAGING: Cancer Staging Breast cancer of lower-outer quadrant of left female breast [facility]) Staging form: Breast, AJCC 7th Edition - Clinical: No stage assigned - Unsigned    INTERVAL HISTORY:  [name] 63 y.o. female seen for follow-up of left breast cancer and osteopenia.  Appetite and energy levels are 100%.  She is taking anastrozole without missing doses.  She is tolerating it very well.  Denies any hot flashes or musculoskeletal pains.  She had bone density test and mammogram done recently.  She denies any new onset bone pains.  She is continuing calcium and vitamin D supplements.   REVIEW OF SYSTEMS:  Review of Systems  All other systems reviewed and are negative.    PAST MEDICAL/SURGICAL HISTORY:  Past Medical History:  Diagnosis Date  . Breast cancer ([place]) 04/20/14   left breast  . Complication of anesthesia    not sure what med was given in 96 with D&C, was very sore all over. Dr Patsey Berthold did anesthesia. Dr Heide Spark did surgery  . Hemorrhoids   . History of breast cancer 05/19/2015  . Hypertension   . Hypothyroidism   . IBD (inflammatory bowel disease)    [facility]  . Personal history of radiation therapy    2015  . Pneumonia   . Psoriasis   . Thyroid disease   . Ulcerative colitis ([place]) 5 1 2007   Dr Gala Romney   Past Surgical  History:  Procedure Laterality Date  . BREAST BIOPSY    . BREAST LUMPECTOMY Left 04/20/2014  . COLONOSCOPY  10/09/2001   RMR: Internal hemorrhoids and anal papilla; otherwise normal rectum  . COLONOSCOPY  12/10/2005   incomplete tcs-  colitis  . COLONOSCOPY N/A 04/18/2014   Dr.Rourk- abnormal rectum & L colon into the mid descending segment. mucosa was erythematious as well as pale w/ some loss of normal vascular apttern. no erosions or ulcers. pt had some smooth peduculated 3-4cm polyps in the sigmoid segment most c/w pseudopolyps. the rest of the colonic mucosa appeared normal. bx= inflammatory polyps  . DILATION AND CURETTAGE OF UTERUS  1996   Dr Heide Spark  . FLEXIBLE SIGMOIDOSCOPY  02/26/2006   endoscopically normal-appearing rectum, colitis in sigmoid mucosa  . PARTIAL MASTECTOMY WITH NEEDLE LOCALIZATION AND AXILLARY SENTINEL LYMPH NODE BX Left 04/20/2014   Procedure: PARTIAL MASTECTOMY AFTER NEEDLE LOCALIZATION AND AXILLARY SENTINEL LYMPH NODE BX;  Surgeon: Jamesetta So, MD;  Location: [facility];  Service: General;  Laterality: Left;  . TUBAL LIGATION    . WISDOM TOOTH EXTRACTION       SOCIAL HISTORY:  Social History   Socioeconomic History  . Marital status: Married    Spouse name: Not on file  . Number of children: 1  . Years of education: Not on file  . Highest  education level: Not on file  Occupational History  . Not on file  Social Needs  . Financial resource strain: Not on file  . Food insecurity    Worry: Not on file    Inability: Not on file  . Transportation needs    Medical: Not on file    Non-medical: Not on file  Tobacco Use  . Smoking status: Former Smoker    Packs/day: 0.00    Years: 15.00    Pack years: 0.00    Types: Cigarettes    Quit date: 07/26/2005    Years since quitting: 13.7  . Smokeless tobacco: Never Used  . Tobacco comment: quit in 2007  Substance and Sexual Activity  . Alcohol use: Yes    Comment: wine every day  . Drug use: No  . Sexual  activity: Not Currently    Birth control/protection: Post-menopausal  Lifestyle  . Physical activity    Days per week: Not on file    Minutes per session: Not on file  . Stress: Not on file  Relationships  . Social Herbalist on phone: Not on file    Gets together: Not on file    Attends religious service: Not on file    Active member of club or organization: Not on file    Attends meetings of clubs or organizations: Not on file    Relationship status: Not on file  . Intimate partner violence    Fear of current or ex partner: Not on file    Emotionally abused: Not on file    Physically abused: Not on file    Forced sexual activity: Not on file  Other Topics Concern  . Not on file  Social History Narrative  . Not on file    FAMILY HISTORY:  Family History  Problem Relation Age of Onset  . Stroke Maternal Grandfather   . Heart attack Maternal Grandfather   . Hypertension Mother   . Diabetes Paternal Grandfather   . Heart disease Paternal Grandfather   . Colon cancer Neg Hx     CURRENT MEDICATIONS:  Outpatient Encounter Medications as of 04/29/2019  Medication Sig  . anastrozole (ARIMIDEX) 1 MG tablet Take 1 tablet (1 mg total) by mouth daily.  Francia Greaves THYROID 60 MG tablet Take 60 mg by mouth daily.   . balsalazide (COLAZAL) 750 MG capsule TAKE 3 CAPSULES BY MOUTH THREE TIMES DAILY.  . Calcium Citrate (CITRACAL PO) Take 2 capsules by mouth daily. 1200 mg calcium and Vitamin D 1000 IU  . Cyanocobalamin (VITAMIN B 12 PO) Take by mouth. Sublingual-once daily  . FOLIC ACID PO Take by mouth daily.  . hydrochlorothiazide (MICROZIDE) 12.5 MG capsule TAKE (1) CAPSULE BY MOUTH ONCE DAILY.  Marland Kitchen lisinopril (ZESTRIL) 20 MG tablet TAKE ONE TABLET BY MOUTH ONCE DAILY.  . [DISCONTINUED] anastrozole (ARIMIDEX) 1 MG tablet TAKE ONE TABLET BY MOUTH ONCE DAILY.  Marland Kitchen acyclovir (ZOVIRAX) 400 MG tablet TAKE (2) TABLETS BY MOUTH TWICE DAILY. (Patient not taking: Reported on 04/27/2019)   . acyclovir ointment (ZOVIRAX) 5 % Apply 1 application topically every 4 (four) hours as needed. (Patient not taking: Reported on 05/29/2018)  . fluticasone (CUTIVATE) 0.05 % cream APPLY TO AFFECTED AREAS TWICE DAILY. (Patient not taking: Reported on 04/29/2019)  . LORazepam (ATIVAN) 0.5 MG tablet Take when gets to airport and can repeat in 4 hours if needed (Patient not taking: Reported on 06/23/2018)   No facility-administered encounter medications on file as of  04/29/2019.     ALLERGIES:  Allergies  Allergen Reactions  . Flagyl [Metronidazole]     Severe nausea and vomiting  . Other Other (See Comments)    Extreme muscle aches several days after surgery. PT SAID SHE WAS ALLERGIC TO SOME MEDICATION USED DURING A PROCEDURE BY DR Patsey Berthold YEARS AGO. SHE DOES NOT KNOW THE NAME OF THE MEDICATION, BUT SAID THAT IT MADE HER WEAK ALL OVER.   Mack Hook [Levofloxacin In D5w]     Excessive abd pains and mucousy stool     PHYSICAL EXAM:  ECOG Performance status: 0  Vitals:   04/29/19 1550  BP: (!) 153/86  Pulse: 84  Resp: 18  Temp: (!) 97.5 F (36.4 C)  SpO2: 98%   Filed Weights   04/29/19 1550  Weight: 195 lb 1.6 oz (88.5 kg)    Physical Exam Vitals signs reviewed.  Constitutional:      Appearance: Normal appearance.  Cardiovascular:     Rate and Rhythm: Normal rate and regular rhythm.     Heart sounds: Normal heart sounds.  Pulmonary:     Effort: Pulmonary effort is normal.     Breath sounds: Normal breath sounds.  Abdominal:     General: There is no distension.     Palpations: Abdomen is soft. There is no mass.  Musculoskeletal:        General: No swelling.  Skin:    General: Skin is warm.  Neurological:     General: No focal deficit present.     Mental Status: She is alert and oriented to person, place, and time.  Psychiatric:        Mood and Affect: Mood normal.        Behavior: Behavior normal.      LABORATORY DATA:  I have reviewed the labs as listed.   CBC    Component Value Date/Time   WBC 6.1 04/27/2019 0931   RBC 4.08 04/27/2019 0931   HGB 13.9 04/27/2019 0931   HGB 11.3 04/26/2015 0815   HCT 44.4 04/27/2019 0931   HCT 34.9 04/26/2015 0815   PLT 232 04/27/2019 0931   PLT 520 (H) 04/26/2015 0815   MCV 108.8 (H) 04/27/2019 0931   MCV 95 04/26/2015 0815   MCH 34.1 (H) 04/27/2019 0931   MCHC 31.3 04/27/2019 0931   RDW 13.2 04/27/2019 0931   RDW 12.6 04/26/2015 0815   LYMPHSABS 1.5 04/27/2019 0931   LYMPHSABS 1.0 04/26/2015 0815   MONOABS 0.4 04/27/2019 0931   EOSABS 0.1 04/27/2019 0931   EOSABS 0.2 04/26/2015 0815   BASOSABS 0.0 04/27/2019 0931   BASOSABS 0.0 04/26/2015 0815   CMP Latest Ref Rng & Units 04/27/2019 12/23/2018 06/23/2018  Glucose 70 - 99 mg/dL 185(H) 109(H) 182(H)  BUN 6 - 20 mg/dL _0 Creatinine 0.44 - 1.00 mg/dL 0.77 0.93 0.96  Sodium 135 - 145 mmol/L 138 139 138  Potassium 3.5 - 5.1 mmol/L 4.4 4.3 4.1  Chloride 98 - 111 mmol/L 100 101 100  CO2 22 - 32 mmol/L _1 Calcium 8.9 - 10.3 mg/dL 8.9 9.6 9.1  Total Protein 6.5 - 8.1 g/dL 7.1 7.0 7.3  Total Bilirubin 0.3 - 1.2 mg/dL 0.9 1.1 0.9  Alkaline Phos 38 - 126 U/L 39 40 41  AST 15 - 41 U/L 31 33 37  ALT 0 - 44 U/L _2 DIAGNOSTIC IMAGING:  I have independently reviewed the scans and  discussed with the patient.   I have reviewed Venita Lick LPN's note and agree with the documentation.  I personally performed a face-to-face visit, made revisions and my assessment and plan is as follows.    ASSESSMENT & PLAN:   Breast cancer of lower-outer quadrant of left female breast 1.  Stage I (T1BN0) left breast cancer: -Status post left lumpectomy on 04/20/2014, 0.9 cm IDC, grade 1, ER/PR positive, HER-2 negative, Ki-67 of 10%. -Oncotype DX score of 7, underwent radiation therapy, started on anastrozole. -Physical exam today did not reveal any abnormalities.  Lumpectomy site in the upper inner quadrant is within normal limits. -We  reviewed mammogram from 04/27/2019 which was BI-RADS Category 2. - She is tolerating anastrozole very well.  She does not report any side effects from it.  She would like to continue anastrozole beyond 5 years. -At this time I will switch her visits to once a year.  We will schedule her next mammogram.  2.  Osteoporosis: - DEXA scan in 2015 showed osteoporosis. -She was started on Prolia every 6 months on 12/30/2014.  -DEXA scan in December 2017 showed T score of -2.4. -Repeat DEXA scan on 04/27/2019 showed T score of -2.1. -She will continue calcium and vitamin D supplements.  Her vitamin D is within normal limits.  She will continue Prolia at this time as long as she is on anastrozole.       Orders placed this encounter:  Orders Placed This Encounter  Procedures  . MM 3D SCREEN BREAST BILATERAL  . CBC with Differential/Platelet  . Comprehensive metabolic panel  . Vitamin D 25 hydroxy      Derek Jack, MD [place] [phone]

## 2019-04-29 NOTE — Assessment & Plan Note (Signed)
1.  Stage I (T1BN0) left breast cancer: -Status post left lumpectomy on 04/20/2014, 0.9 cm IDC, grade 1, ER/PR positive, HER-2 negative, Ki-67 of 10%. -Oncotype DX score of 7, underwent radiation therapy, started on anastrozole. -Physical exam today did not reveal any abnormalities.  Lumpectomy site in the upper inner quadrant is within normal limits. -We reviewed mammogram from 04/27/2019 which was BI-RADS Category 2. - She is tolerating anastrozole very well.  She does not report any side effects from it.  She would like to continue anastrozole beyond 5 years. -At this time I will switch her visits to once a year.  We will schedule her next mammogram.  2.  Osteoporosis: - DEXA scan in 2015 showed osteoporosis. -She was started on Prolia every 6 months on 12/30/2014.  -DEXA scan in December 2017 showed T score of -2.4. -Repeat DEXA scan on 04/27/2019 showed T score of -2.1. -She will continue calcium and vitamin D supplements.  Her vitamin D is within normal limits.  She will continue Prolia at this time as long as she is on anastrozole.  

## 2019-05-03 ENCOUNTER — Encounter: Payer: Self-pay | Admitting: *Deleted

## 2019-06-01 ENCOUNTER — Other Ambulatory Visit: Payer: Self-pay

## 2019-06-01 ENCOUNTER — Ambulatory Visit (INDEPENDENT_AMBULATORY_CARE_PROVIDER_SITE_OTHER): Payer: BC Managed Care – PPO | Admitting: Family Medicine

## 2019-06-01 DIAGNOSIS — I10 Essential (primary) hypertension: Secondary | ICD-10-CM

## 2019-06-01 NOTE — Progress Notes (Signed)
   Subjective:    Patient ID: Beth Hamilton, female    DOB: [birth year], 63 y.o.   MRN: [id number]  Hypertension This is a chronic problem. Pertinent negatives include no chest pain or shortness of breath. (Blood pressure at Oncologist office and [place] office has been high)  pt states that she does not check blood pressure often. Pt states she feel fine.  The patient relates that a few different times blood pressure has been elevated when she has been at various offices so she is interested in getting this checked again we talked about having her come to the office later this week to check it she does take her medicine watches salt in her diet denies other troubles currently Virtual Visit via Video Note  I connected with Beth Hamilton on 06/01/19 at  8:30 AM EST by a video enabled telemedicine application and verified that I am speaking with the correct person using two identifiers.  Location: Patient: home Provider: office   I discussed the limitations of evaluation and management by telemedicine and the availability of in person appointments. The patient expressed understanding and agreed to proceed.  History of Present Illness:    Observations/Objective:   Assessment and Plan:   Follow Up Instructions:    I discussed the assessment and treatment plan with the patient. The patient was provided an opportunity to ask questions and all were answered. The patient agreed with the plan and demonstrated an understanding of the instructions.   The patient was advised to call back or seek an in-person evaluation if the symptoms worsen or if the condition fails to improve as anticipated.  I provided 15 minutes of non-face-to-face time during this encounter.   Vicente Males, LPN     Review of Systems  Constitutional: Negative for activity change, appetite change and fatigue.  HENT: Negative for congestion and rhinorrhea.   Respiratory: Negative for cough and shortness of  breath.   Cardiovascular: Negative for chest pain and leg swelling.  Gastrointestinal: Negative for abdominal pain and diarrhea.  Endocrine: Negative for polydipsia and polyphagia.  Skin: Negative for color change.  Neurological: Negative for dizziness and weakness.  Psychiatric/Behavioral: Negative for behavioral problems and confusion.       Objective:   Physical Exam  Patient had virtual visit Appears to be in no distress Atraumatic Neuro able to relate and oriented No apparent resp distress Color normal       Assessment & Plan:  Blood pressure There is some concern that it may not be under great control She will come by 11:40 AM on Thursday and we will check her blood pressure It will be important for the patient to be sitting in regular chair for 5 minutes then checking blood pressure in making sure that I am aware so I can go and talk with the patient check her blood pressure

## 2019-06-03 ENCOUNTER — Ambulatory Visit (INDEPENDENT_AMBULATORY_CARE_PROVIDER_SITE_OTHER): Payer: BC Managed Care – PPO | Admitting: Family Medicine

## 2019-06-03 ENCOUNTER — Other Ambulatory Visit: Payer: Self-pay

## 2019-06-03 VITALS — BP 134/88

## 2019-06-03 DIAGNOSIS — I10 Essential (primary) hypertension: Secondary | ICD-10-CM

## 2019-06-03 MED ORDER — HYDROCHLOROTHIAZIDE 25 MG PO TABS: 25.0000 mg | ORAL_TABLET | Freq: Every day | ORAL | 4 refills | Status: DC

## 2019-06-03 NOTE — Patient Instructions (Signed)

## 2019-06-03 NOTE — Progress Notes (Signed)
Courtesy blood pressure check Please see previous office visit from 06/01/2019 Blood pressure subpar at 134/88 We discussed options Therefore increase HCTZ New dose 25 mg daily continue lisinopril 20 mg daily Follow-up in spring follow-up sooner if problems Warnings were discussed.  Fall Risk  08/21/2015 06/23/2014 06/09/2014 05/24/2014 05/09/2014  Falls in the past year? No No No No No

## 2019-06-11 ENCOUNTER — Other Ambulatory Visit: Payer: Self-pay | Admitting: Gastroenterology

## 2019-06-25 ENCOUNTER — Other Ambulatory Visit: Payer: Self-pay | Admitting: Gastroenterology

## 2019-06-25 ENCOUNTER — Ambulatory Visit (HOSPITAL_COMMUNITY): Payer: BC Managed Care – PPO

## 2019-06-25 ENCOUNTER — Other Ambulatory Visit (HOSPITAL_COMMUNITY): Payer: BC Managed Care – PPO

## 2019-06-25 ENCOUNTER — Telehealth: Payer: Self-pay | Admitting: Internal Medicine

## 2019-06-25 NOTE — Telephone Encounter (Signed)
Pt returned call. Pt was seen on 04/27/2019 and was doing well with taking Colazal daily.  Pt is having a colitis flare. Pt hasn't has a flare in 4 years. Pt is having abdominal cramping x 1 week that's getting worse. Pt is having frequent bowel movements that are getting loose. Pt reports no diarrhea as of yet. Pt would like to see if RMR could send in prednisone for her. Pt states her last Prednisone RX was 4 years ago with her last colitis flare.

## 2019-06-25 NOTE — Telephone Encounter (Signed)
Lmom, waiting on a return call.  

## 2019-06-25 NOTE — Telephone Encounter (Signed)
Pt called asking to speak with nurse. She thinks she's having a colitis flare up and was asking if RMR would call in a prednisone Rx for her. Please advise. I transferred call to VM per patient's request. [phone]

## 2019-06-27 NOTE — Telephone Encounter (Signed)
Lets get an updated report on 12 7.  Question any NSAIDs.  Further recommendations to follow when I get an update

## 2019-06-28 ENCOUNTER — Telehealth: Payer: Self-pay | Admitting: Internal Medicine

## 2019-06-28 ENCOUNTER — Other Ambulatory Visit: Payer: Self-pay

## 2019-06-28 MED ORDER — BUDESONIDE 3 MG PO CPEP: 9.0000 mg | ORAL_CAPSULE | Freq: Every day | ORAL | 3 refills | Status: DC

## 2019-06-28 NOTE — Telephone Encounter (Signed)
Okay.  Entocort 9 mg daily x1 month (3) 3 mg tablets daily.  Dispense 90.  Telephone progress report in 1 month or office visit with app at that time and we will go from there.  3 refills.

## 2019-06-28 NOTE — Telephone Encounter (Signed)
Looked in pts chart and didn't see Entocort prescribed. Spoke with pt, pt states she hasn't tried Entocort before and will try it if RMR feels it will be best for her. Pt states she hasn't had a flare up in a long time and needs some type of relief.

## 2019-06-28 NOTE — Telephone Encounter (Signed)
Message sent to RMR. See other phone note.

## 2019-06-28 NOTE — Telephone Encounter (Signed)
Pt called asking to speak with RMR nurse. Please call [phone]

## 2019-06-28 NOTE — Telephone Encounter (Signed)
Spoke with pt. Pt is not happy and states she knows when she is having a flare. Pt states she isn't well and has had three bowel movements already this morning 06/28/2019. Pt hasn't had any NSAIDs and hasn't had a fever. Pt says she usually gets a low grade fever when she has a flare and feels one may be starting. Pt is requesting prednisone.

## 2019-06-28 NOTE — Telephone Encounter (Signed)
Would prefer an empiric trial of Entocort 9 mg daily. Better side effect profile.  Did she have any issues with Entocort previously?

## 2019-06-28 NOTE — Telephone Encounter (Signed)
Noted. RX Entocort 9 mg daily #90 with 3 rfs was sent to [facility]. Pt is aware that she will need to call back in 1 month with a progress report since she doesn't want to schedule an appointment for 1 month. Pt is aware that this medication will need to be tapered off instead of stopping it after 1 month.

## 2019-07-01 ENCOUNTER — Other Ambulatory Visit (HOSPITAL_COMMUNITY): Payer: Self-pay | Admitting: *Deleted

## 2019-07-01 ENCOUNTER — Other Ambulatory Visit: Payer: Self-pay

## 2019-07-01 DIAGNOSIS — C50512 Malignant neoplasm of lower-outer quadrant of left female breast: Secondary | ICD-10-CM

## 2019-07-01 DIAGNOSIS — M810 Age-related osteoporosis without current pathological fracture: Secondary | ICD-10-CM

## 2019-07-02 ENCOUNTER — Encounter (HOSPITAL_COMMUNITY): Payer: Self-pay

## 2019-07-02 ENCOUNTER — Inpatient Hospital Stay (HOSPITAL_COMMUNITY): Payer: BC Managed Care – PPO

## 2019-07-02 ENCOUNTER — Inpatient Hospital Stay (HOSPITAL_COMMUNITY): Payer: BC Managed Care – PPO | Attending: Hematology

## 2019-07-02 VITALS — BP 170/89 | HR 77 | Temp 97.1°F | Resp 16

## 2019-07-02 DIAGNOSIS — M810 Age-related osteoporosis without current pathological fracture: Secondary | ICD-10-CM | POA: Diagnosis not present

## 2019-07-02 DIAGNOSIS — C50512 Malignant neoplasm of lower-outer quadrant of left female breast: Secondary | ICD-10-CM | POA: Diagnosis not present

## 2019-07-02 LAB — COMPREHENSIVE METABOLIC PANEL
ALT: 23 U/L (ref 0–44)
AST: 25 U/L (ref 15–41)
Albumin: 3.8 g/dL (ref 3.5–5.0)
Alkaline Phosphatase: 49 U/L (ref 38–126)
Anion gap: 10 (ref 5–15)
BUN: 19 mg/dL (ref 6–20)
CO2: 31 mmol/L (ref 22–32)
Calcium: 9.8 mg/dL (ref 8.9–10.3)
Chloride: 100 mmol/L (ref 98–111)
Creatinine, Ser: 0.83 mg/dL (ref 0.44–1.00)
GFR calc Af Amer: >60 mL/min (ref >60)
GFR calc non Af Amer: >60 mL/min (ref >60)
Glucose, Bld: 107 mg/dL — ABNORMAL HIGH (ref 70–99)
Potassium: 4.4 mmol/L (ref 3.5–5.1)
Sodium: 141 mmol/L (ref 135–145)
Total Bilirubin: 0.6 mg/dL (ref 0.3–1.2)
Total Protein: 7.1 g/dL (ref 6.5–8.1)

## 2019-07-02 MED ORDER — DENOSUMAB 60 MG/ML ~~LOC~~ SOSY
60.0000 mg | PREFILLED_SYRINGE | Freq: Once | SUBCUTANEOUS | Status: AC
  Administered 2019-07-02: 10:00:00 60 mg via SUBCUTANEOUS
  Filled 2019-07-02: qty 1

## 2019-07-02 NOTE — Progress Notes (Signed)
Patient taking calcium as directed.  Denied tooth, jaw, and leg pain.  No recent or upcoming dental visits.  Patient tolerated injection with no complaints voiced.  Site clean and dry with no bruising or swelling noted at site.  Band aid applied.  Vss with discharge and left ambulatory with no s/s of distress noted.

## 2019-07-09 ENCOUNTER — Other Ambulatory Visit: Payer: Self-pay | Admitting: Family Medicine

## 2019-07-14 ENCOUNTER — Telehealth: Payer: Self-pay | Admitting: Internal Medicine

## 2019-07-14 NOTE — Telephone Encounter (Signed)
Routing to RMR.

## 2019-07-14 NOTE — Telephone Encounter (Signed)
Spoke to [place], will have Dr. Gala Romney address as he is in the middle of management of this patient and advised for Entocort over prednisone.

## 2019-07-14 NOTE — Telephone Encounter (Signed)
See phone note that started 06/25/19.  Called pt, she started Entocort 14m 3 tablets daily at lunch 06/28/19. She feels the Entocort isn't working as fast as Prednisone. She has several BM's usually in the mornings, then less later in day. Has had 3 BM's so far this morning. BM is soft with little bit of pink tinged mucous. No red blood. Abdominal pain is better, however she has some cramping prior to having BM. Cramping is relieved with BM. No fever. States when she took Prednisone in past it worked faster than Entocort. She thought BM would be more solid by now. Colonoscopy is scheduled for 08/02/19. She wants to know if she should give Entocort longer or wait to see what Colonoscopy shows. She is aware RMR is out of office and message will be sent to [facility]SL.

## 2019-07-14 NOTE — Telephone Encounter (Signed)
Called and informed pt.  Rx called in to [facility]. Spoke to [place].

## 2019-07-14 NOTE — Telephone Encounter (Signed)
Surprising Entocort is not helping.  We will go along with prednisone.  Rx prednisone 20 mg daily.  Dispense the 10 mg tablet.  Dispense 60.  Take 2 daily.  Continue until seen at time of colonoscopy next month.  1 refill.  Stop Entocort.  If prednisone does not help we need to entertain other possible as causing  colitis symptoms.

## 2019-07-14 NOTE — Telephone Encounter (Signed)
[phone] PATIENT CALLED WITH QUESTIONS ABOUT MEDICATION, COULD NOT REMEMBER THE NAME, BUT IT IS NOT WORKING AND WOULD LIKE SOMETHING ELSE

## 2019-07-22 ENCOUNTER — Telehealth: Payer: Self-pay

## 2019-07-22 ENCOUNTER — Telehealth: Payer: Self-pay | Admitting: Internal Medicine

## 2019-07-22 NOTE — Telephone Encounter (Signed)
Called pt. She wanted to see where exactly where the covid testing place was. I advised of location. Nothing further needed

## 2019-07-22 NOTE — Telephone Encounter (Signed)
Pt is scheduled procedure with RMR on 08/02/2019 and has questions for the nurse. Please call [phone]

## 2019-07-22 NOTE — Telephone Encounter (Signed)
Pt called office and LMOVM, Tri-Lyte out of stock at pharmacy.  Miralax instructions placed at front desk for pt to pickup since she may not get in time if mailed. Tried to call pt, no answer, LMOVM and informed her to p/u instructions at office.

## 2019-07-29 ENCOUNTER — Other Ambulatory Visit: Payer: Self-pay

## 2019-07-29 ENCOUNTER — Encounter (HOSPITAL_COMMUNITY): Payer: Self-pay

## 2019-07-29 DIAGNOSIS — Z01812 Encounter for preprocedural laboratory examination: Secondary | ICD-10-CM | POA: Diagnosis not present

## 2019-07-29 DIAGNOSIS — K5150 Left sided colitis without complications: Secondary | ICD-10-CM | POA: Diagnosis not present

## 2019-07-29 DIAGNOSIS — K649 Unspecified hemorrhoids: Secondary | ICD-10-CM | POA: Diagnosis not present

## 2019-07-29 DIAGNOSIS — K635 Polyp of colon: Secondary | ICD-10-CM | POA: Diagnosis not present

## 2019-07-29 DIAGNOSIS — K6289 Other specified diseases of anus and rectum: Secondary | ICD-10-CM | POA: Diagnosis not present

## 2019-07-29 DIAGNOSIS — Z20822 Contact with and (suspected) exposure to covid-19: Secondary | ICD-10-CM | POA: Diagnosis not present

## 2019-07-30 ENCOUNTER — Other Ambulatory Visit (HOSPITAL_COMMUNITY)
Admission: RE | Admit: 2019-07-30 | Discharge: 2019-07-30 | Disposition: A | Discharge status: Home / Self Care | Payer: BC Managed Care – PPO | Source: Ambulatory Visit | Attending: Internal Medicine | Admitting: Internal Medicine

## 2019-07-30 ENCOUNTER — Encounter (HOSPITAL_COMMUNITY)
Admission: RE | Admit: 2019-07-30 | Discharge: 2019-07-30 | Disposition: A | Discharge status: Home / Self Care | Payer: BC Managed Care – PPO | Source: Ambulatory Visit | Attending: Internal Medicine | Admitting: Internal Medicine

## 2019-07-30 DIAGNOSIS — K635 Polyp of colon: Secondary | ICD-10-CM | POA: Diagnosis not present

## 2019-07-30 DIAGNOSIS — Z20822 Contact with and (suspected) exposure to covid-19: Secondary | ICD-10-CM | POA: Insufficient documentation

## 2019-07-30 DIAGNOSIS — Z01812 Encounter for preprocedural laboratory examination: Secondary | ICD-10-CM | POA: Diagnosis not present

## 2019-07-30 DIAGNOSIS — K5150 Left sided colitis without complications: Secondary | ICD-10-CM | POA: Diagnosis not present

## 2019-07-30 DIAGNOSIS — K6289 Other specified diseases of anus and rectum: Secondary | ICD-10-CM | POA: Insufficient documentation

## 2019-07-30 DIAGNOSIS — K649 Unspecified hemorrhoids: Secondary | ICD-10-CM | POA: Insufficient documentation

## 2019-07-30 LAB — SARS CORONAVIRUS 2 (TAT 6-24 HRS): SARS Coronavirus 2: NEGATIVE

## 2019-08-02 ENCOUNTER — Encounter (HOSPITAL_COMMUNITY)
Admission: RE | Disposition: A | Discharge status: Home / Self Care | Payer: Self-pay | Source: Home / Self Care | Attending: Internal Medicine

## 2019-08-02 ENCOUNTER — Encounter (HOSPITAL_COMMUNITY): Payer: Self-pay | Admitting: Internal Medicine

## 2019-08-02 ENCOUNTER — Other Ambulatory Visit: Payer: Self-pay

## 2019-08-02 ENCOUNTER — Ambulatory Visit (HOSPITAL_COMMUNITY): Payer: BC Managed Care – PPO | Admitting: Anesthesiology

## 2019-08-02 ENCOUNTER — Encounter: Payer: Self-pay | Admitting: Internal Medicine

## 2019-08-02 ENCOUNTER — Ambulatory Visit (HOSPITAL_COMMUNITY)
Admission: RE | Admit: 2019-08-02 | Discharge: 2019-08-02 | Disposition: A | Discharge status: Home / Self Care | Payer: BC Managed Care – PPO | Attending: Internal Medicine | Admitting: Internal Medicine

## 2019-08-02 DIAGNOSIS — Z79899 Other long term (current) drug therapy: Secondary | ICD-10-CM | POA: Diagnosis not present

## 2019-08-02 DIAGNOSIS — E039 Hypothyroidism, unspecified: Secondary | ICD-10-CM | POA: Diagnosis not present

## 2019-08-02 DIAGNOSIS — Z853 Personal history of malignant neoplasm of breast: Secondary | ICD-10-CM | POA: Insufficient documentation

## 2019-08-02 DIAGNOSIS — I10 Essential (primary) hypertension: Secondary | ICD-10-CM | POA: Insufficient documentation

## 2019-08-02 DIAGNOSIS — Z7952 Long term (current) use of systemic steroids: Secondary | ICD-10-CM | POA: Insufficient documentation

## 2019-08-02 DIAGNOSIS — K5150 Left sided colitis without complications: Secondary | ICD-10-CM | POA: Diagnosis not present

## 2019-08-02 DIAGNOSIS — Z923 Personal history of irradiation: Secondary | ICD-10-CM | POA: Insufficient documentation

## 2019-08-02 DIAGNOSIS — Z87891 Personal history of nicotine dependence: Secondary | ICD-10-CM | POA: Diagnosis not present

## 2019-08-02 DIAGNOSIS — K523 Indeterminate colitis: Secondary | ICD-10-CM

## 2019-08-02 DIAGNOSIS — K5190 Ulcerative colitis, unspecified, without complications: Secondary | ICD-10-CM

## 2019-08-02 DIAGNOSIS — Z79890 Hormone replacement therapy (postmenopausal): Secondary | ICD-10-CM | POA: Diagnosis not present

## 2019-08-02 HISTORY — PX: BIOPSY: SHX5522

## 2019-08-02 HISTORY — PX: COLONOSCOPY WITH PROPOFOL: SHX5780

## 2019-08-02 SURGERY — COLONOSCOPY WITH PROPOFOL
Anesthesia: General

## 2019-08-02 MED ORDER — CHLORHEXIDINE GLUCONATE CLOTH 2 % EX PADS: 6.0000 | MEDICATED_PAD | Freq: Once | CUTANEOUS | Status: DC

## 2019-08-02 MED ORDER — LACTATED RINGERS IV SOLN: INTRAVENOUS | Status: DC

## 2019-08-02 MED ORDER — LIDOCAINE 2% (20 MG/ML) 5 ML SYRINGE
INTRAMUSCULAR | Status: AC
  Filled 2019-08-02: qty 5

## 2019-08-02 MED ORDER — PROPOFOL 10 MG/ML IV BOLUS
INTRAVENOUS | Status: DC | PRN
  Administered 2019-08-02: 100 mg via INTRAVENOUS
  Administered 2019-08-02 (×2): 30 mg via INTRAVENOUS

## 2019-08-02 MED ORDER — EPHEDRINE 5 MG/ML INJ
INTRAVENOUS | Status: AC
  Filled 2019-08-02: qty 10

## 2019-08-02 MED ORDER — LIDOCAINE HCL 1 % IJ SOLN
INTRAMUSCULAR | Status: DC | PRN
  Administered 2019-08-02: 50 mg via INTRADERMAL

## 2019-08-02 MED ORDER — PROPOFOL 10 MG/ML IV BOLUS
INTRAVENOUS | Status: AC
  Filled 2019-08-02: qty 20

## 2019-08-02 MED ORDER — PROPOFOL 10 MG/ML IV BOLUS
INTRAVENOUS | Status: AC
  Filled 2019-08-02: qty 40

## 2019-08-02 MED ORDER — HYDROCODONE-ACETAMINOPHEN 7.5-325 MG PO TABS: 1.0000 | ORAL_TABLET | Freq: Once | ORAL | Status: DC | PRN

## 2019-08-02 MED ORDER — PHENYLEPHRINE 40 MCG/ML (10ML) SYRINGE FOR IV PUSH (FOR BLOOD PRESSURE SUPPORT)
PREFILLED_SYRINGE | INTRAVENOUS | Status: AC
  Filled 2019-08-02: qty 10

## 2019-08-02 MED ORDER — HYDROMORPHONE HCL 1 MG/ML IJ SOLN: 0.2500 mg | INTRAMUSCULAR | Status: DC | PRN

## 2019-08-02 MED ORDER — PROPOFOL 500 MG/50ML IV EMUL
INTRAVENOUS | Status: DC | PRN
  Administered 2019-08-02: 150 ug/kg/min via INTRAVENOUS

## 2019-08-02 MED ORDER — PROMETHAZINE HCL 25 MG/ML IJ SOLN: 6.2500 mg | INTRAMUSCULAR | Status: DC | PRN

## 2019-08-02 MED ORDER — MIDAZOLAM HCL 2 MG/2ML IJ SOLN: 0.5000 mg | Freq: Once | INTRAMUSCULAR | Status: DC | PRN

## 2019-08-02 NOTE — H&P (Signed)
@LOGO @   Primary Care Physician:  [name], MD Primary Gastroenterologist:  Dr. Gala Romney Pre-Procedure History & Physical: HPI:  Beth Hamilton is a 64 y.o. female here for surveillance colonoscopy.  Longstanding history of indeterminate colitis predominantly left-sided.  Recent flare for which she went on prednisone.  This has improved significantly.  She continues on Colazol.  Denies nonsteroidal agents.  Past Medical History:  Diagnosis Date  . Breast cancer ([place]) 04/20/14   left breast  . Complication of anesthesia    not sure what med was given in 96 with D&C, was very sore all over. Dr Patsey Berthold did anesthesia. Dr Heide Spark did surgery  . Hemorrhoids   . History of breast cancer 05/19/2015  . Hypertension   . Hypothyroidism   . IBD (inflammatory bowel disease)    [facility]  . Personal history of radiation therapy    2015  . Pneumonia   . Psoriasis   . Thyroid disease   . Ulcerative colitis ([place]) 5 1 2007   Dr Gala Romney    Past Surgical History:  Procedure Laterality Date  . BREAST BIOPSY    . BREAST LUMPECTOMY Left 04/20/2014  . COLONOSCOPY  10/09/2001   RMR: Internal hemorrhoids and anal papilla; otherwise normal rectum  . COLONOSCOPY  12/10/2005   incomplete tcs-  colitis  . COLONOSCOPY N/A 04/18/2014   Dr.Jamaira Sherk- abnormal rectum & L colon into the mid descending segment. mucosa was erythematious as well as pale w/ some loss of normal vascular apttern. no erosions or ulcers. pt had some smooth peduculated 3-4cm polyps in the sigmoid segment most c/w pseudopolyps. the rest of the colonic mucosa appeared normal. bx= inflammatory polyps  . DILATION AND CURETTAGE OF UTERUS  1996   Dr Heide Spark  . FLEXIBLE SIGMOIDOSCOPY  02/26/2006   endoscopically normal-appearing rectum, colitis in sigmoid mucosa  . PARTIAL MASTECTOMY WITH NEEDLE LOCALIZATION AND AXILLARY SENTINEL LYMPH NODE BX Left 04/20/2014   Procedure: PARTIAL MASTECTOMY AFTER NEEDLE LOCALIZATION AND AXILLARY SENTINEL LYMPH NODE  BX;  Surgeon: Jamesetta So, MD;  Location: [facility];  Service: General;  Laterality: Left;  . TUBAL LIGATION    . WISDOM TOOTH EXTRACTION      Prior to Admission medications   Medication Sig Start Date End Date Taking? Authorizing Provider  acyclovir ointment (ZOVIRAX) 5 % Apply 1 application topically every 4 (four) hours as needed. 07/09/16  Yes Jonnie Kind, MD  anastrozole (ARIMIDEX) 1 MG tablet Take 1 tablet (1 mg total) by mouth daily. 04/29/19  Yes Derek Jack, MD  ARMOUR THYROID 60 MG tablet Take 60 mg by mouth daily.  11/16/18  Yes [provider]  B Complex Vitamins (VITAMIN-B COMPLEX PO) Take 1 mL by mouth daily. Vit b12   Yes [provider]  balsalazide (COLAZAL) 750 MG capsule TAKE 3 CAPSULES BY MOUTH THREE TIMES DAILY. Patient taking differently: Take 2,250 mg by mouth 3 (three) times daily.  06/11/19  Yes Annitta Needs, NP  Calcium Citrate (CITRACAL PO) Take 2 capsules by mouth daily. 1200 mg calcium and Vitamin D 1000 IU   Yes [provider]  Calcium-Magnesium-Vitamin D (CALCIUM 1200+D3 PO) Take 2,400 Units by mouth daily. D3 5000   Yes [provider]  Cyanocobalamin (VITAMIN B 12 PO) Take by mouth. Sublingual-once daily   Yes [provider]  folic acid (FOLVITE) 834 MCG tablet Take 400 mcg by mouth daily.    Yes [provider]  hydrochlorothiazide (HYDRODIURIL) 25 MG tablet Take  1 tablet (25 mg total) by mouth daily. 06/03/19  Yes Luking, Elayne Snare, MD  lisinopril (ZESTRIL) 20 MG tablet TAKE ONE TABLET BY MOUTH ONCE DAILY. Patient taking differently: Take 20 mg by mouth daily.  07/09/19  Yes Luking, Elayne Snare, MD  predniSONE (DELTASONE) 10 MG tablet Take 20 mg by mouth daily. 07/14/19  Yes [provider]  acyclovir (ZOVIRAX) 400 MG tablet TAKE (2) TABLETS BY MOUTH TWICE DAILY. Patient taking differently: Take 400 mg by mouth as needed (Cold sores).  07/09/16   Jonnie Kind, MD  budesonide (ENTOCORT  EC) 3 MG 24 hr capsule Take 3 capsules (9 mg total) by mouth daily. Patient not taking: Reported on 07/20/2019 06/28/19   Daneil Dolin, MD  fluticasone (CUTIVATE) 0.05 % cream APPLY TO AFFECTED AREAS TWICE DAILY. Patient taking differently: Apply 1 application topically daily as needed (yeast).  12/18/18   [name], MD  LORazepam (ATIVAN) 0.5 MG tablet Take when gets to airport and can repeat in 4 hours if needed Patient taking differently: Take 0.5 mg by mouth as needed for anxiety. Take when gets to airport and can repeat in 4 hours if needed 05/29/18   Estill Dooms, NP    Allergies as of 04/28/2019 - Review Complete 04/27/2019  Allergen Reaction Noted  . Flagyl [metronidazole]  10/29/2013  . Other Other (See Comments) 03/22/2014  . Levaquin [levofloxacin in d5w]  07/23/2013    Family History  Problem Relation Age of Onset  . Stroke Maternal Grandfather   . Heart attack Maternal Grandfather   . Hypertension Mother   . Diabetes Paternal Grandfather   . Heart disease Paternal Grandfather   . Colon cancer Neg Hx     Social History   Socioeconomic History  . Marital status: Married    Spouse name: Not on file  . Number of children: 1  . Years of education: Not on file  . Highest education level: Not on file  Occupational History  . Not on file  Tobacco Use  . Smoking status: Former Smoker    Packs/day: 0.00    Years: 15.00    Pack years: 0.00    Types: Cigarettes    Quit date: 07/26/2005    Years since quitting: 14.0  . Smokeless tobacco: Never Used  . Tobacco comment: quit in 2007  Substance and Sexual Activity  . Alcohol use: Yes    Comment: wine every day  . Drug use: No  . Sexual activity: Not Currently    Birth control/protection: Post-menopausal  Other Topics Concern  . Not on file  Social History Narrative  . Not on file   Social Determinants of Health   Financial Resource Strain:   . Difficulty of Paying Living Expenses: Not on file  Food  Insecurity:   . Worried About Charity fundraiser in the Last Year: Not on file  . Ran Out of Food in the Last Year: Not on file  Transportation Needs:   . Lack of Transportation (Medical): Not on file  . Lack of Transportation (Non-Medical): Not on file  Physical Activity:   . Days of Exercise per Week: Not on file  . Minutes of Exercise per Session: Not on file  Stress:   . Feeling of Stress : Not on file  Social Connections:   . Frequency of Communication with Friends and Family: Not on file  . Frequency of Social Gatherings with Friends and Family: Not on file  . Attends  Religious Services: Not on file  . Active Member of Clubs or Organizations: Not on file  . Attends Archivist Meetings: Not on file  . Marital Status: Not on file  Intimate Partner Violence:   . Fear of Current or Ex-Partner: Not on file  . Emotionally Abused: Not on file  . Physically Abused: Not on file  . Sexually Abused: Not on file    Review of Systems: See HPI, otherwise negative ROS  Physical Exam: BP (!) 136/91   Pulse 88   Temp 98.4 F (36.9 C) (Oral)   Resp 16   SpO2 99%  General:   Alert,  Well-developed, well-nourished, pleasant and cooperative in NAD Neck:  Supple; no masses or thyromegaly. No significant cervical adenopathy. Lungs:  Clear throughout to auscultation.   No wheezes, crackles, or rhonchi. No acute distress. Heart:  Regular rate and rhythm; no murmurs, clicks, rubs,  or gallops. Abdomen: Non-distended, normal bowel sounds.  Soft and nontender without appreciable mass or hepatosplenomegaly.  Pulses:  Normal pulses noted. Extremities:  Without clubbing or edema.  Impression/Plan: 64 year old lady with a history of indeterminate colitis-longstanding.  Recent flare.  Here for surveillance examination.    The risks, benefits, limitations, alternatives and imponderables have been reviewed with the patient. Questions have been answered. All parties are agreeable.       Notice: This dictation was prepared with Dragon dictation along with smaller phrase technology. Any transcriptional errors that result from this process are unintentional and may not be corrected upon review.

## 2019-08-02 NOTE — Discharge Instructions (Signed)
Colonoscopy Discharge Instructions  Read the instructions outlined below and refer to this sheet in the next few weeks. These discharge instructions provide you with general information on caring for yourself after you leave the hospital. Your doctor may also give you specific instructions. While your treatment has been planned according to the most current medical practices available, unavoidable complications occasionally occur. If you have any problems or questions after discharge, call Dr. Gala Romney at [phone]. ACTIVITY  You may resume your regular activity, but move at a slower pace for the next 24 hours.   Take frequent rest periods for the next 24 hours.   Walking will help get rid of the air and reduce the bloated feeling in your belly (abdomen).   No driving for 24 hours (because of the medicine (anesthesia) used during the test).    Do not sign any important legal documents or operate any machinery for 24 hours (because of the anesthesia used during the test).  NUTRITION  Drink plenty of fluids.   You may resume your normal diet as instructed by your doctor.   Begin with a light meal and progress to your normal diet. Heavy or fried foods are harder to digest and may make you feel sick to your stomach (nauseated).   Avoid alcoholic beverages for 24 hours or as instructed.  MEDICATIONS  You may resume your normal medications unless your doctor tells you otherwise.  WHAT YOU CAN EXPECT TODAY  Some feelings of bloating in the abdomen.   Passage of more gas than usual.   Spotting of blood in your stool or on the toilet paper.  IF YOU HAD POLYPS REMOVED DURING THE COLONOSCOPY:  No aspirin products for 7 days or as instructed.   No alcohol for 7 days or as instructed.   Eat a soft diet for the next 24 hours.  FINDING OUT THE RESULTS OF YOUR TEST Not all test results are available during your visit. If your test results are not back during the visit, make an appointment  with your caregiver to find out the results. Do not assume everything is normal if you have not heard from your caregiver or the medical facility. It is important for you to follow up on all of your test results.  SEEK IMMEDIATE MEDICAL ATTENTION IF:  You have more than a spotting of blood in your stool.   Your belly is swollen (abdominal distention).   You are nauseated or vomiting.   You have a temperature over 101.   You have abdominal pain or discomfort that is severe or gets worse throughout the day.    Inflammation in your colon looks much worse than it did 5 years ago.  Biopsies taken.  Continue Colazal; will increase prednisone to 40 mg daily  You may be best served by going on biologic therapy for inflammatory bowel disease (injection medication.  Further recommendations to follow once the biopsy report returns for review  Appointment with [place] in 4 weeks.  At patient request, I spoke to Armando Gang sister-in-law at [phone]    Monitored Anesthesia Care, Care After These instructions provide you with information about caring for yourself after your procedure. Your health care provider may also give you more specific instructions. Your treatment has been planned according to current medical practices, but problems sometimes occur. Call your health care provider if you have any problems or questions after your procedure. What can I expect after the procedure? After your procedure, you may:  Feel sleepy  for several hours.  Feel clumsy and have poor balance for several hours.  Feel forgetful about what happened after the procedure.  Have poor judgment for several hours.  Feel nauseous or vomit.  Have a sore throat if you had a breathing tube during the procedure. Follow these instructions at home: For at least 24 hours after the procedure:      Have a responsible adult stay with you. It is important to have someone help care for you until you are awake and  alert.  Rest as needed.  Do not: ? Participate in activities in which you could fall or become injured. ? Drive. ? Use heavy machinery. ? Drink alcohol. ? Take sleeping pills or medicines that cause drowsiness. ? Make important decisions or sign legal documents. ? Take care of children on your own. Eating and drinking  Follow the diet that is recommended by your health care provider.  If you vomit, drink water, juice, or soup when you can drink without vomiting.  Make sure you have little or no nausea before eating solid foods. General instructions  Take over-the-counter and prescription medicines only as told by your health care provider.  If you have sleep apnea, surgery and certain medicines can increase your risk for breathing problems. Follow instructions from your health care provider about wearing your sleep device: ? Anytime you are sleeping, including during daytime naps. ? While taking prescription pain medicines, sleeping medicines, or medicines that make you drowsy.  If you smoke, do not smoke without supervision.  Keep all follow-up visits as told by your health care provider. This is important. Contact a health care provider if:  You keep feeling nauseous or you keep vomiting.  You feel light-headed.  You develop a rash.  You have a fever. Get help right away if:  You have trouble breathing. Summary  For several hours after your procedure, you may feel sleepy and have poor judgment.  Have a responsible adult stay with you for at least 24 hours or until you are awake and alert. This information is not intended to replace advice given to you by your health care provider. Make sure you discuss any questions you have with your health care provider. Document Revised: 10/06/2017 Document Reviewed: 10/29/2015 Elsevier Patient Education  [place].

## 2019-08-02 NOTE — Addendum Note (Signed)
Addendum  created 08/02/19 1006 by Lyda Jester, CRNA   Intraprocedure Staff edited

## 2019-08-02 NOTE — Anesthesia Postprocedure Evaluation (Signed)
Anesthesia Post Note  Patient: Beth Hamilton  Procedure(s) Performed: COLONOSCOPY WITH PROPOFOL (N/A ) BIOPSY  Patient location during evaluation: PACU Anesthesia Type: General Level of consciousness: awake and alert and oriented Pain management: pain level controlled Vital Signs Assessment: post-procedure vital signs reviewed and stable Respiratory status: spontaneous breathing Cardiovascular status: stable Postop Assessment: no apparent nausea or vomiting Anesthetic complications: no     Last Vitals:  Vitals:   08/02/19 0915 08/02/19 0928  BP: 120/80 136/74  Pulse: 75 79  Resp: 15   Temp:  37 C  SpO2: 100% 100%    Last Pain:  Vitals:   08/02/19 0928  TempSrc: Oral  PainSc: 0-No pain                 Wofford Stratton A

## 2019-08-02 NOTE — Anesthesia Preprocedure Evaluation (Signed)
Anesthesia Evaluation  General Assessment Comment:States had muscle pain all over a day after D&C -sounds like Sux   History of Anesthesia Complications (+) history of anesthetic complications  Airway Mallampati: III       Dental  (+) Poor Dentition, Teeth Intact   Pulmonary pneumonia, resolved, former smoker,           Cardiovascular Exercise Tolerance: Good hypertension, Pt. on medications I     Neuro/Psych Anxiety    GI/Hepatic PUD,   Endo/Other  Hypothyroidism   Renal/GU      Musculoskeletal   Abdominal   Peds  Hematology   Anesthesia Other Findings   Reproductive/Obstetrics                             Anesthesia Physical Anesthesia Plan  ASA: II  Anesthesia Plan: General   Post-op Pain Management:    Induction: Intravenous  PONV Risk Score and Plan: 3 and TIVA, Propofol infusion, Midazolam and Treatment may vary due to age or medical condition  Airway Management Planned: Nasal Cannula and Simple Face Mask  Additional Equipment:   Intra-op Plan:   Post-operative Plan:   Informed Consent: I have reviewed the patients History and Physical, chart, labs and discussed the procedure including the risks, benefits and alternatives for the proposed anesthesia with the patient or authorized representative who has indicated his/her understanding and acceptance.     Dental advisory given  Plan Discussed with: CRNA  Anesthesia Plan Comments: (Plan Full PPE use  Plan GA with GETA as needed d/w pt -WTP with same after Q&A)        Anesthesia Quick Evaluation

## 2019-08-02 NOTE — Transfer of Care (Signed)
Immediate Anesthesia Transfer of Care Note  Patient: Beth Hamilton  Procedure(s) Performed: COLONOSCOPY WITH PROPOFOL (N/A ) BIOPSY  Patient Location: PACU  Anesthesia Type:MAC  Level of Consciousness: awake, alert  and oriented  Airway & Oxygen Therapy: Patient Spontanous Breathing  Post-op Assessment: Report given to RN, Post -op Vital signs reviewed and stable and Patient moving all extremities X 4  Post vital signs: Reviewed and stable  Last Vitals:  Vitals Value Taken Time  BP 98/67 08/02/19 0906  Temp    Pulse 83 08/02/19 0908  Resp 16 08/02/19 0908  SpO2 99 % 08/02/19 0908  Vitals shown include unvalidated device data.  Last Pain:  Vitals:   08/02/19 0835  TempSrc:   PainSc: 0-No pain      Patients Stated Pain Goal: 5 (54/65/68 1275)  Complications: No apparent anesthesia complications

## 2019-08-02 NOTE — Op Note (Signed)
[facility] Patient Name: Beth Hamilton Procedure Date: 08/02/2019 7:08 AM MRN: [id number] Date of Birth: 1956/04/23 Attending MD: Norvel Richards , MD CSN: [id number] Age: 64 Admit Type: Outpatient Procedure:                Colonoscopy Indications:              Ulcerative colitis Providers:                Norvel Richards, MD, Jeanann Lewandowsky. Sharon Seller, RN,                            Raphael Gibney, Technician Referring MD:             Elayne Snare. Luking Medicines:                Propofol per Anesthesia Complications:            No immediate complications. Estimated Blood Loss:     Estimated blood loss was minimal. Procedure:                Pre-Anesthesia Assessment:                           - Prior to the procedure, a History and Physical                            was performed, and patient medications and                            allergies were reviewed. The patient's tolerance of                            previous anesthesia was also reviewed. The risks                            and benefits of the procedure and the sedation                            options and risks were discussed with the patient.                            All questions were answered, and informed consent                            was obtained. Prior Anticoagulants: The patient has                            taken no previous anticoagulant or antiplatelet                            agents. ASA Grade Assessment: II - A patient with                            mild systemic disease. After reviewing the risks  and benefits, the patient was deemed in                            satisfactory condition to undergo the procedure.                           After obtaining informed consent, the colonoscope                            was passed under direct vision. Throughout the                            procedure, the patient's blood pressure, pulse, and   oxygen saturations were monitored continuously. The                            [id number]-[id number] ([id number]) scope was introduced through                            the anus and advanced to the 5 cm into the ileum.                            The colonoscopy was performed without difficulty.                            The patient tolerated the procedure well. The                            quality of the bowel preparation was adequate. The                            terminal ileum, ileocecal valve, appendiceal                            orifice, and rectum were photographed. Scope In: 8:41:29 AM Scope Out: 8:59:44 AM Scope Withdrawal Time: 0 hours 14 minutes 52 seconds  Total Procedure Duration: 0 hours 18 minutes 15 seconds  Findings:      The perianal and digital rectal examinations were normal.       Normal-appearing rectal mucosa. Markedly abnormal sigmoid with edema and       deep geographic ulcerations extending into the descending segment where       there was erythema and erosions diffusely. There were pseudopolyps as       well. Inflammation appeared to taper off going into the transverse       segment scattered erosions. Ascending colon segment looked good.       Normal-appearing distal 5 cm of terminal ileum. Segmental biopsies of       the entire colon and rectum taken for histologic study Impression:               -Ulcerated and eroded colonic mucosa with most                            severe changes in the left colon particularly the  sigmoid segment. Relative sparing of the rectum and                            ascending segment normal terminal ileum. Status                            post segmental biopsy. Pseudopolyp formation                           Recent flare in symptoms treated with prednisone 20                            mg daily. She has significant inflammation. Denies                            nonsteroidal use. Moderate Sedation:       Moderate (conscious) sedation was personally administered by an       anesthesia professional. The following parameters were monitored: oxygen       saturation, heart rate, blood pressure, respiratory rate, EKG, adequacy       of pulmonary ventilation, and response to care. Recommendation:           - Patient has a contact number available for                            emergencies. The signs and symptoms of potential                            delayed complications were discussed with the                            patient. Return to normal activities tomorrow.                            Written discharge instructions were provided to the                            patient.                           - Advance diet as tolerated.                           - Continue present medications. Continue Colazol.                            Increase prednisone to 40 mg daily for the next 4                            weeks. Office visit in 4 weeks. Biopsies will be                            checked for CMV. I suspect patient will need to go  on biologic therapy in the near future. Procedure Code(s):        --- Professional ---                           [id number], Colonoscopy, flexible; diagnostic, including                            collection of specimen(s) by brushing or washing,                            when performed (separate procedure) Diagnosis Code(s):        --- Professional ---                           K51.90, Ulcerative colitis, unspecified, without                            complications CPT copyright 2019 American Medical Association. All rights reserved. The codes documented in this report are preliminary and upon coder review may  be revised to meet current compliance requirements. Cristopher Estimable. Ashiya Kinkead, MD Norvel Richards, MD 08/02/2019 9:16:42 AM This report has been signed electronically. Number of Addenda: 0

## 2019-08-03 DIAGNOSIS — E890 Postprocedural hypothyroidism: Secondary | ICD-10-CM | POA: Diagnosis not present

## 2019-08-03 DIAGNOSIS — E042 Nontoxic multinodular goiter: Secondary | ICD-10-CM | POA: Diagnosis not present

## 2019-08-04 LAB — SURGICAL PATHOLOGY

## 2019-08-07 NOTE — Progress Notes (Signed)
I have called patient and discussed results via telephone.  Left-sided colitis appears somewhat more severe than seen previously.  She states she is doing great now on prednisone 40 mg daily along with colas all. No CMV on histology.  She denies taking NSAIDs.  She has a follow-up appointment on February 12 with [place].  I briefly discussed with her the avenue of treatment with Biologics.  I think if she were to flare on a  prednisone taper next month we should go in that direction or if she has a close interval flare after remission in the future.  However, her flares have been few and far between;  we will go the prednisone/mesalamine  for the time being.  Please send a copy of my pathology report to PCP.

## 2019-09-02 NOTE — Progress Notes (Signed)
Primary Care Physician: [name], MD  Primary Gastroenterologist:  Garfield Cornea, MD   Chief Complaint  Patient presents with  . Follow-up    FU from TCS,wt gain    HPI: Beth Hamilton is a 64 y.o. female here for follow-up.  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  Colonoscopy in 2015 with relatively inactive disease with pseudopolyp formation.  Patient called in December 4th complaining of a flare.  Maintenance medication includes colazal.  Initially was started on Entocort for her flare.  Due to lack of response she was transitioned to prednisone 20 mg daily.  She completed colonoscopy on January 11.  Noted to have ulcerated and eroded colonic mucosa with most severe changes in the left colon particularly in the sigmoid segment. Relative sparing of the rectum and ascending segment normal terminal ileum. Status post segmental biopsy consistent with endoscopic findings. Pseudopolyp formation.  Biopsies negative for CMV.  Her prednisone was increased to 40 mg daily at that time.  Denies NSAIDs.  Currently on prednisone 56m daily.  Has gained about 10 pounds.  Ready to start her taper.  Bowel movements are back to normal.  Generally 2 bowel movements daily, semiformed stool.  No blood in the stool.  No further abdominal pain.  During recent flare she is having 7-8 soft loose stools daily mixed with some mucus and blood.  Denies any nausea or vomiting.  Appetite good.   Current Outpatient Medications  Medication Sig Dispense Refill  . acyclovir (ZOVIRAX) 400 MG tablet TAKE (2) TABLETS BY MOUTH TWICE DAILY. (Patient taking differently: Take 400 mg by mouth as needed (Cold sores). ) 20 tablet 11  . acyclovir ointment (ZOVIRAX) 5 % Apply 1 application topically every 4 (four) hours as needed. 30 g 3  . anastrozole (ARIMIDEX) 1 MG tablet Take 1 tablet (1 mg total) by mouth daily. 30 tablet 12  . ARMOUR THYROID 60 MG tablet Take 60 mg by mouth daily.     .  B Complex Vitamins (VITAMIN-B COMPLEX PO) Take 1 mL by mouth daily. Vit b12    . balsalazide (COLAZAL) 750 MG capsule TAKE 3 CAPSULES BY MOUTH THREE TIMES DAILY. (Patient taking differently: Take 2,250 mg by mouth 3 (three) times daily. ) 270 capsule 3  . Calcium-Magnesium-Vitamin D (CALCIUM 1200+D3 PO) Take 2,400 Units by mouth daily. D3 5000    . Cyanocobalamin (VITAMIN B 12 PO) Take by mouth. Sublingual-once daily    . fluticasone (CUTIVATE) 0.05 % cream APPLY TO AFFECTED AREAS TWICE DAILY. (Patient taking differently: Apply 1 application topically daily as needed (yeast). ) 60 g 1  . folic acid (FOLVITE) 4914MCG tablet Take 400 mcg by mouth daily.     . hydrochlorothiazide (HYDRODIURIL) 25 MG tablet Take 1 tablet (25 mg total) by mouth daily. 30 tablet 4  . lisinopril (ZESTRIL) 20 MG tablet TAKE ONE TABLET BY MOUTH ONCE DAILY. (Patient taking differently: Take 20 mg by mouth daily. ) 90 tablet 0  . LORazepam (ATIVAN) 0.5 MG tablet Take when gets to airport and can repeat in 4 hours if needed (Patient taking differently: Take 0.5 mg by mouth as needed for anxiety. Take when gets to airport and can repeat in 4 hours if needed) 10 tablet 0   No current facility-administered medications for this visit.    Allergies as of 09/03/2019 - Review Complete 09/03/2019  Allergen Reaction Noted  . Flagyl [metronidazole]  10/29/2013  . Other Other (See Comments)  03/22/2014  . Levaquin [levofloxacin in d5w]  07/23/2013    ROS:  General: Negative for anorexia, weight loss, fever, chills, fatigue, weakness. ENT: Negative for hoarseness, difficulty swallowing , nasal congestion. CV: Negative for chest pain, angina, palpitations, dyspnea on exertion, peripheral edema.  Respiratory: Negative for dyspnea at rest, dyspnea on exertion, cough, sputum, wheezing.  GI: See history of present illness. GU:  Negative for dysuria, hematuria, urinary incontinence, urinary frequency, nocturnal urination.  Endo:  Negative for unusual weight change.  See HPI   Physical Examination:   BP (!) 158/92 (BP Location: Right Arm)   Pulse 92   Temp (!) 96.8 F (36 C) (Temporal)   Ht 5' 4"  (1.626 m)   Wt 202 lb (91.6 kg)   BMI 34.67 kg/m   General: Well-nourished, well-developed in no acute distress.  Eyes: No icterus. Mouth: masked  Abdomen: Bowel sounds are normal, nontender, nondistended, no hepatosplenomegaly or masses, no abdominal bruits or hernia , no rebound or guarding.   Extremities: No lower extremity edema. No clubbing or deformities. Neuro: Alert and oriented x 4   Skin: Warm and dry, no jaundice.   Psych: Alert and cooperative, normal mood and affect.  Labs:  Lab Results  Component Value Date   CREATININE 0.83 07/02/2019   BUN 19 07/02/2019   NA 141 07/02/2019   K 4.4 07/02/2019   CL 100 07/02/2019   CO2 31 07/02/2019    Lab Results  Component Value Date   ALT 23 07/02/2019   AST 25 07/02/2019   ALKPHOS 49 07/02/2019   BILITOT 0.6 07/02/2019     Imaging Studies: No results found.

## 2019-09-03 ENCOUNTER — Encounter: Payer: Self-pay | Admitting: Gastroenterology

## 2019-09-03 ENCOUNTER — Other Ambulatory Visit: Payer: Self-pay

## 2019-09-03 ENCOUNTER — Ambulatory Visit: Payer: BC Managed Care – PPO | Admitting: Gastroenterology

## 2019-09-03 DIAGNOSIS — K529 Noninfective gastroenteritis and colitis, unspecified: Secondary | ICD-10-CM | POA: Diagnosis not present

## 2019-09-03 NOTE — Patient Instructions (Addendum)
Start prednisone taper tomorrow. Call if you start having flare during the taper.  Prednisone 27m daily for 7 days.  Prednisone 244mdaily for 7 days.  Prednisone 1520maily for 7 days.   Prednisone 80m18mily for 7 days.   Prednisone 5mg 36mly 7 days.   Continue Colazal 3 capsules three times per day.   Return to the office in one year or sooner if needed.

## 2019-09-03 NOTE — Assessment & Plan Note (Signed)
Recent flare as outlined above, finally clinically in remission on prednisone 40 mg daily.  Recent colonoscopy in the setting of prednisone 20 mg daily along with her Colazal showing active colitis particularly in the sigmoid region.  Per Dr.Rourk's recent discussion with her, we will avoid biologic therapy at this time unless she has difficulty coming off of prednisone or if she has close interval flare.  For now we will continue colazal.  Start prednisone taper, instructions provided, see patient instructions section.  She is to call if she has recurrent symptoms during her taper.  We will be glad to see her back at any time but we mutually agreed on 1 year follow-up.

## 2019-09-08 ENCOUNTER — Telehealth: Payer: Self-pay | Admitting: Gastroenterology

## 2019-09-08 NOTE — Telephone Encounter (Signed)
Please NIC for next colonoscopy in 07/2021, for surveillance for indeterminate colitis  Please let pt know RMR says next tcs in 07/2021.

## 2019-09-13 NOTE — Telephone Encounter (Signed)
Reminder in epic °

## 2019-09-13 NOTE — Telephone Encounter (Signed)
Spoke with pt. Pt notified that next TCS is 07/2021. Please NIC for TCS 07/2021.

## 2019-09-22 ENCOUNTER — Other Ambulatory Visit: Payer: Self-pay | Admitting: Gastroenterology

## 2019-09-29 ENCOUNTER — Other Ambulatory Visit: Payer: Self-pay | Admitting: Internal Medicine

## 2019-09-30 NOTE — Telephone Encounter (Signed)
Patient should be on prednisone taper. Does she already have this at home?

## 2019-09-30 NOTE — Telephone Encounter (Signed)
Lmom for pt to call me back. 

## 2019-10-01 NOTE — Telephone Encounter (Signed)
Pt called back and said that she has the 20 mg because it was better for her cost wise.  She is taking half of them 10 mg right now.  She said that tomorrow she will start 5 mg dosage but is just going to quarter the pill to save cost wise. She said that she does not need [place] to fill the medication.  She said that she has what she needs.

## 2019-10-08 ENCOUNTER — Other Ambulatory Visit: Payer: Self-pay | Admitting: Family Medicine

## 2019-11-06 ENCOUNTER — Other Ambulatory Visit: Payer: Self-pay | Admitting: Family Medicine

## 2019-11-17 ENCOUNTER — Telehealth (INDEPENDENT_AMBULATORY_CARE_PROVIDER_SITE_OTHER): Payer: BC Managed Care – PPO | Admitting: Family Medicine

## 2019-11-17 ENCOUNTER — Other Ambulatory Visit: Payer: Self-pay

## 2019-11-17 ENCOUNTER — Telehealth: Payer: Self-pay | Admitting: *Deleted

## 2019-11-17 DIAGNOSIS — E7849 Other hyperlipidemia: Secondary | ICD-10-CM

## 2019-11-17 DIAGNOSIS — I10 Essential (primary) hypertension: Secondary | ICD-10-CM

## 2019-11-17 DIAGNOSIS — E038 Other specified hypothyroidism: Secondary | ICD-10-CM | POA: Diagnosis not present

## 2019-11-17 MED ORDER — HYDROCHLOROTHIAZIDE 25 MG PO TABS: 25.0000 mg | ORAL_TABLET | Freq: Every day | ORAL | 1 refills | Status: DC

## 2019-11-17 MED ORDER — LISINOPRIL 20 MG PO TABS: 20.0000 mg | ORAL_TABLET | Freq: Every day | ORAL | 1 refills | Status: DC

## 2019-11-17 NOTE — Progress Notes (Signed)
   Subjective:    Patient ID: Beth Hamilton, female    DOB: Jul 18, 1956, 64 y.o.   MRN: [id number]  Hypertension This is a chronic problem. The current episode started more than 1 year ago. The problem is unchanged. Condition status: Patient does not check blood pressure at home. Treatments tried: Hctz and lisinopril. There are no compliance problems.    Requesting refill for hctz.   Review of Systems     Objective:   Physical Exam  Virtual Visit via Video Note  I connected with MAZIKEEN HEHN on 11/17/19 at  8:40 AM EDT by a video enabled telemedicine application and verified that I am speaking with the correct person using two identifiers.  Location: Patient: home Provider: office   I discussed the limitations of evaluation and management by telemedicine and the availability of in person appointments. The patient expressed understanding and agreed to proceed.  History of Present Illness:    Observations/Objective:   Assessment and Plan:   Follow Up Instructions:    I discussed the assessment and treatment plan with the patient. The patient was provided an opportunity to ask questions and all were answered. The patient agreed with the plan and demonstrated an understanding of the instructions.   The patient was advised to call back or seek an in-person evaluation if the symptoms worsen or if the condition fails to improve as anticipated.  I provided 20 minutes of non-face-to-face time during this encounter.        Assessment & Plan:  1. HTN (hypertension), benign Blood pressure reportedly good control watch diet watch salt keep taking medicine check lab work  2. Other specified hypothyroidism Continue medication follows with Dr.Kerr Check labs will share labs with specialist  Patient did have Covid vaccine

## 2019-11-17 NOTE — Telephone Encounter (Signed)
Ms. camilia, caywood are scheduled for a virtual visit with your provider today.    Just as we do with appointments in the office, we must obtain your consent to participate.  Your consent will be active for this visit and any virtual visit you may have with one of our providers in the next 365 days.    If you have a MyChart account, I can also send a copy of this consent to you electronically.  All virtual visits are billed to your insurance company just like a traditional visit in the office.  As this is a virtual visit, video technology does not allow for your provider to perform a traditional examination.  This may limit your provider's ability to fully assess your condition.  If your provider identifies any concerns that need to be evaluated in person or the need to arrange testing such as labs, EKG, etc, we will make arrangements to do so.    Although advances in technology are sophisticated, we cannot ensure that it will always work on either your end or our end.  If the connection with a video visit is poor, we may have to switch to a telephone visit.  With either a video or telephone visit, we are not always able to ensure that we have a secure connection.   I need to obtain your verbal consent now.   Are you willing to proceed with your visit today?   Beth Hamilton has provided verbal consent on 11/17/2019 for a virtual visit (video or telephone).   Patsy Lager, LPN 4/38/8875  7:97 AM

## 2019-12-31 ENCOUNTER — Other Ambulatory Visit (HOSPITAL_COMMUNITY): Payer: Self-pay | Admitting: *Deleted

## 2019-12-31 DIAGNOSIS — M810 Age-related osteoporosis without current pathological fracture: Secondary | ICD-10-CM

## 2019-12-31 DIAGNOSIS — C50512 Malignant neoplasm of lower-outer quadrant of left female breast: Secondary | ICD-10-CM

## 2020-01-03 ENCOUNTER — Inpatient Hospital Stay (HOSPITAL_COMMUNITY): Payer: BC Managed Care – PPO

## 2020-01-03 ENCOUNTER — Other Ambulatory Visit: Payer: Self-pay

## 2020-01-03 ENCOUNTER — Inpatient Hospital Stay (HOSPITAL_COMMUNITY): Payer: BC Managed Care – PPO | Attending: Hematology

## 2020-01-03 VITALS — BP 162/87 | HR 93 | Temp 97.5°F | Resp 18

## 2020-01-03 DIAGNOSIS — M810 Age-related osteoporosis without current pathological fracture: Secondary | ICD-10-CM | POA: Insufficient documentation

## 2020-01-03 DIAGNOSIS — C50512 Malignant neoplasm of lower-outer quadrant of left female breast: Secondary | ICD-10-CM | POA: Diagnosis present

## 2020-01-03 DIAGNOSIS — Z170 Estrogen receptor positive status [ER+]: Secondary | ICD-10-CM

## 2020-01-03 LAB — COMPREHENSIVE METABOLIC PANEL WITH GFR
ALT: 22 U/L (ref 0–44)
AST: 33 U/L (ref 15–41)
Albumin: 3.9 g/dL (ref 3.5–5.0)
Alkaline Phosphatase: 48 U/L (ref 38–126)
Anion gap: 10 (ref 5–15)
BUN: 14 mg/dL (ref 8–23)
CO2: 31 mmol/L (ref 22–32)
Calcium: 9.7 mg/dL (ref 8.9–10.3)
Chloride: 97 mmol/L — ABNORMAL LOW (ref 98–111)
Creatinine, Ser: 0.85 mg/dL (ref 0.44–1.00)
GFR calc Af Amer: >60 mL/min
GFR calc non Af Amer: >60 mL/min
Glucose, Bld: 143 mg/dL — ABNORMAL HIGH (ref 70–99)
Potassium: 4.3 mmol/L (ref 3.5–5.1)
Sodium: 138 mmol/L (ref 135–145)
Total Bilirubin: 0.6 mg/dL (ref 0.3–1.2)
Total Protein: 6.9 g/dL (ref 6.5–8.1)

## 2020-01-03 MED ORDER — DENOSUMAB 60 MG/ML ~~LOC~~ SOSY
PREFILLED_SYRINGE | SUBCUTANEOUS | Status: AC
  Filled 2020-01-03: qty 1

## 2020-01-03 MED ORDER — DENOSUMAB 60 MG/ML ~~LOC~~ SOSY
60.0000 mg | PREFILLED_SYRINGE | Freq: Once | SUBCUTANEOUS | Status: AC
  Administered 2020-01-03: 60 mg via SUBCUTANEOUS

## 2020-01-03 NOTE — Patient Instructions (Signed)
[facility]  Discharge Instructions:   _______________________________________________________________  Thank you for choosing [facility] at [facility] to provide your oncology and hematology care.  To afford each patient quality time with our providers, please arrive at least 15 minutes before your scheduled appointment.  You need to re-schedule your appointment if you arrive 10 or more minutes late.  We strive to give you quality time with our providers, and arriving late affects you and other patients whose appointments are after yours.  Also, if you no show three or more times for appointments you may be dismissed from the clinic.  Again, thank you for choosing [facility] at [facility]. Our hope is that these requests will allow you access to exceptional care and in a timely manner. _______________________________________________________________  If you have questions after your visit, please contact our office at [phone] between the hours of 8:30 a.m. and 5:00 p.m. Voicemails left after 4:30 p.m. will not be returned until the following business day. _______________________________________________________________  For prescription refill requests, have your pharmacy contact our office. _______________________________________________________________  Recommendations made by the consultant and any test results will be sent to your referring physician. _______________________________________________________________ 

## 2020-01-03 NOTE — Progress Notes (Signed)
Beth Hamilton presents today for injection per MD orders. Prolia 60 mg administered SQ in right Upper Arm. Administration without incident. Patient tolerated well.   Vitals stable and discharged home from clinic ambulatory. Follow up as scheduled.

## 2020-01-06 ENCOUNTER — Other Ambulatory Visit: Payer: Self-pay | Admitting: Internal Medicine

## 2020-01-15 LAB — BASIC METABOLIC PANEL
BUN/Creatinine Ratio: 22 (ref 12–28)
BUN: 21 mg/dL (ref 8–27)
CO2: 27 mmol/L (ref 20–29)
Calcium: 9.3 mg/dL (ref 8.7–10.3)
Chloride: 100 mmol/L (ref 96–106)
Creatinine, Ser: 0.94 mg/dL (ref 0.57–1.00)
GFR calc Af Amer: 76 mL/min/{1.73_m2} (ref >59)
GFR calc non Af Amer: 66 mL/min/{1.73_m2} (ref >59)
Glucose: 113 mg/dL — ABNORMAL HIGH (ref 65–99)
Potassium: 4.8 mmol/L (ref 3.5–5.2)
Sodium: 143 mmol/L (ref 134–144)

## 2020-01-15 LAB — LIPID PANEL
Chol/HDL Ratio: 3.3 ratio (ref 0.0–4.4)
Cholesterol, Total: 248 mg/dL — ABNORMAL HIGH (ref 100–199)
HDL: 75 mg/dL (ref >39)
LDL Chol Calc (NIH): 147 mg/dL — ABNORMAL HIGH (ref 0–99)
Triglycerides: 150 mg/dL — ABNORMAL HIGH (ref 0–149)
VLDL Cholesterol Cal: 26 mg/dL (ref 5–40)

## 2020-01-15 LAB — T4, FREE: Free T4: 0.97 ng/dL (ref 0.82–1.77)

## 2020-01-15 LAB — TSH: TSH: 4.96 u[IU]/mL — ABNORMAL HIGH (ref 0.450–4.500)

## 2020-01-16 ENCOUNTER — Encounter: Payer: Self-pay | Admitting: Family Medicine

## 2020-01-21 ENCOUNTER — Other Ambulatory Visit: Payer: Self-pay | Admitting: Gastroenterology

## 2020-03-22 ENCOUNTER — Encounter: Payer: Self-pay | Admitting: Orthopaedic Surgery

## 2020-03-22 ENCOUNTER — Other Ambulatory Visit: Payer: Self-pay

## 2020-03-22 ENCOUNTER — Ambulatory Visit: Payer: BC Managed Care – PPO

## 2020-03-22 ENCOUNTER — Telehealth: Payer: Self-pay | Admitting: Orthopaedic Surgery

## 2020-03-22 ENCOUNTER — Ambulatory Visit (INDEPENDENT_AMBULATORY_CARE_PROVIDER_SITE_OTHER): Payer: BC Managed Care – PPO | Admitting: Orthopaedic Surgery

## 2020-03-22 VITALS — BP 163/103 | HR 104 | Ht 64.0 in | Wt 195.0 lb

## 2020-03-22 DIAGNOSIS — M79672 Pain in left foot: Secondary | ICD-10-CM

## 2020-03-22 DIAGNOSIS — M10072 Idiopathic gout, left ankle and foot: Secondary | ICD-10-CM | POA: Diagnosis not present

## 2020-03-22 MED ORDER — PREDNISONE 5 MG (21) PO TBPK: ORAL_TABLET | ORAL | 0 refills | Status: DC

## 2020-03-22 NOTE — Progress Notes (Signed)
Subjective:    Patient ID: Beth Hamilton, female    DOB: 01-Feb-1956, 64 y.o.   MRN: [id number]  HPI She has had pain of the dorsum and medial side of the left foot for two weeks.  It is red at times.  She has marked pain at times.  Nothing seems to help it.  Ice, rest, elevation and Advil have not helped.  She has no trauma.    She does not know if gout runs in her family.   Review of Systems  Constitutional: Positive for activity change.  Musculoskeletal: Positive for arthralgias, gait problem and joint swelling.  All other systems reviewed and are negative.  For Review of Systems, all other systems reviewed and are negative.  The following is a summary of the past history medically, past history surgically, known current medicines, social history and family history.  This information is gathered electronically by the computer from prior information and documentation.  I review this each visit and have found including this information at this point in the chart is beneficial and informative.   Past Medical History:  Diagnosis Date  . Breast cancer ([place]) 04/20/14   left breast  . Complication of anesthesia    not sure what med was given in 96 with D&C, was very sore all over. Dr Patsey Berthold did anesthesia. Dr Heide Spark did surgery  . Hemorrhoids   . History of breast cancer 05/19/2015  . Hypertension   . Hypothyroidism   . IBD (inflammatory bowel disease)    [facility]  . Personal history of radiation therapy    2015  . Pneumonia   . Psoriasis   . Thyroid disease   . Ulcerative colitis ([place]) 5 1 2007   Dr Gala Romney    Past Surgical History:  Procedure Laterality Date  . BIOPSY  08/02/2019   Procedure: BIOPSY;  Surgeon: Daneil Dolin, MD;  Location: [facility];  Service: Endoscopy;;  colon  . BREAST BIOPSY    . BREAST LUMPECTOMY Left 04/20/2014  . COLONOSCOPY  10/09/2001   RMR: Internal hemorrhoids and anal papilla; otherwise normal rectum  . COLONOSCOPY  12/10/2005   incomplete  tcs-  colitis  . COLONOSCOPY N/A 04/18/2014   Dr.Rourk- abnormal rectum & L colon into the mid descending segment. mucosa was erythematious as well as pale w/ some loss of normal vascular apttern. no erosions or ulcers. pt had some smooth peduculated 3-4cm polyps in the sigmoid segment most c/w pseudopolyps. the rest of the colonic mucosa appeared normal. bx= inflammatory polyps  . COLONOSCOPY WITH PROPOFOL N/A 08/02/2019   Procedure: COLONOSCOPY WITH PROPOFOL;  Surgeon: Daneil Dolin, MD;  Location: [facility];  Service: Endoscopy;  Laterality: N/A;  8:30am  . DILATION AND CURETTAGE OF UTERUS  1996   Dr Heide Spark  . FLEXIBLE SIGMOIDOSCOPY  02/26/2006   endoscopically normal-appearing rectum, colitis in sigmoid mucosa  . PARTIAL MASTECTOMY WITH NEEDLE LOCALIZATION AND AXILLARY SENTINEL LYMPH NODE BX Left 04/20/2014   Procedure: PARTIAL MASTECTOMY AFTER NEEDLE LOCALIZATION AND AXILLARY SENTINEL LYMPH NODE BX;  Surgeon: Jamesetta So, MD;  Location: [facility];  Service: General;  Laterality: Left;  . TUBAL LIGATION    . WISDOM TOOTH EXTRACTION      Current Outpatient Medications on File Prior to Visit  Medication Sig Dispense Refill  . acyclovir (ZOVIRAX) 400 MG tablet TAKE (2) TABLETS BY MOUTH TWICE DAILY. 20 tablet 11  . acyclovir ointment (ZOVIRAX) 5 % Apply 1 application topically every 4 (  four) hours as needed. 30 g 3  . anastrozole (ARIMIDEX) 1 MG tablet Take 1 tablet (1 mg total) by mouth daily. 30 tablet 12  . ARMOUR THYROID 60 MG tablet Take 60 mg by mouth daily.     . B Complex Vitamins (VITAMIN-B COMPLEX PO) Take 1 mL by mouth daily. Vit b12    . balsalazide (COLAZAL) 750 MG capsule TAKE 3 CAPSULES BY MOUTH THREE TIMES DAILY. 270 capsule 5  . Calcium-Magnesium-Vitamin D (CALCIUM 1200+D3 PO) Take 2,400 Units by mouth daily. D3 5000    . Cyanocobalamin (VITAMIN B 12 PO) Take by mouth. Sublingual-once daily    . fluticasone (CUTIVATE) 0.05 % cream APPLY TO AFFECTED AREAS TWICE DAILY. 60 g  1  . folic acid (FOLVITE) 810 MCG tablet Take 400 mcg by mouth daily.     . hydrochlorothiazide (HYDRODIURIL) 25 MG tablet Take 1 tablet (25 mg total) by mouth daily. 90 tablet 1  . lisinopril (ZESTRIL) 20 MG tablet Take 1 tablet (20 mg total) by mouth daily. 90 tablet 1  . LORazepam (ATIVAN) 0.5 MG tablet Take when gets to airport and can repeat in 4 hours if needed 10 tablet 0   No current facility-administered medications on file prior to visit.    Social History   Socioeconomic History  . Marital status: Married    Spouse name: Not on file  . Number of children: 1  . Years of education: Not on file  . Highest education level: Not on file  Occupational History  . Not on file  Tobacco Use  . Smoking status: Former Smoker    Packs/day: 0.00    Years: 15.00    Pack years: 0.00    Types: Cigarettes    Quit date: 07/26/2005    Years since quitting: 14.6  . Smokeless tobacco: Never Used  . Tobacco comment: quit in 2007  Vaping Use  . Vaping Use: Never used  Substance and Sexual Activity  . Alcohol use: Yes    Comment: wine every day  . Drug use: No  . Sexual activity: Not Currently    Birth control/protection: Post-menopausal  Other Topics Concern  . Not on file  Social History Narrative  . Not on file   Social Determinants of Health   Financial Resource Strain:   . Difficulty of Paying Living Expenses: Not on file  Food Insecurity:   . Worried About Charity fundraiser in the Last Year: Not on file  . Ran Out of Food in the Last Year: Not on file  Transportation Needs:   . Lack of Transportation (Medical): Not on file  . Lack of Transportation (Non-Medical): Not on file  Physical Activity:   . Days of Exercise per Week: Not on file  . Minutes of Exercise per Session: Not on file  Stress:   . Feeling of Stress : Not on file  Social Connections:   . Frequency of Communication with Friends and Family: Not on file  . Frequency of Social Gatherings with Friends and  Family: Not on file  . Attends Religious Services: Not on file  . Active Member of Clubs or Organizations: Not on file  . Attends Archivist Meetings: Not on file  . Marital Status: Not on file  Intimate Partner Violence:   . Fear of Current or Ex-Partner: Not on file  . Emotionally Abused: Not on file  . Physically Abused: Not on file  . Sexually Abused: Not on file  Family History  Problem Relation Age of Onset  . Stroke Maternal Grandfather   . Heart attack Maternal Grandfather   . Hypertension Mother   . Diabetes Paternal Grandfather   . Heart disease Paternal Grandfather   . Colon cancer Neg Hx     BP (!) 163/103   Pulse (!) 104   Ht 5' 4"  (1.626 m)   Wt 195 lb (88.5 kg)   BMI 33.47 kg/m   Body mass index is 33.47 kg/m.      Objective:   Physical Exam Vitals and nursing note reviewed.  Constitutional:      Appearance: She is well-developed.  HENT:     Head: Normocephalic and atraumatic.  Eyes:     Conjunctiva/sclera: Conjunctivae normal.     Pupils: Pupils are equal, round, and reactive to light.  Cardiovascular:     Rate and Rhythm: Normal rate and regular rhythm.  Pulmonary:     Effort: Pulmonary effort is normal.  Abdominal:     Palpations: Abdomen is soft.  Musculoskeletal:     Cervical back: Normal range of motion and neck supple.       Feet:  Skin:    General: Skin is warm and dry.  Neurological:     Mental Status: She is alert and oriented to person, place, and time.     Cranial Nerves: No cranial nerve deficit.     Motor: No abnormal muscle tone.     Coordination: Coordination normal.     Deep Tendon Reflexes: Reflexes are normal and symmetric. Reflexes normal.  Psychiatric:        Behavior: Behavior normal.        Thought Content: Thought content normal.        Judgment: Judgment normal.     X-rays were done of the left foot, reported separately.      Assessment & Plan:   Encounter Diagnoses  Name Primary?  . Acute  pain of left foot Yes  . Acute idiopathic gout involving toe of left foot    I am concerned she has gout of the foot.  I will get serum uric acid level done.  I will give prednisone dose pack,  Return in one week.  Call if any problem.  Precautions discussed.   Electronically [place], MD 9/1/20212:11 PM

## 2020-03-22 NOTE — Telephone Encounter (Signed)
Let's move the 150pm virtual visit to 130pm, and have this patient come in today at 140pm.  Otherwise will be next week.  She can always seek treatment at the urgent care in the interim if needed.

## 2020-03-22 NOTE — Telephone Encounter (Signed)
Scheduled patient accordingly; aware.

## 2020-03-22 NOTE — Telephone Encounter (Signed)
Call just received from (new) patient inquiring about appointment today or this week with either Dr, for a left foot injury which occurred a few days ago while in her yard - no treatment yet. I relayed to patient Dr Aline Brochure has no immediate openings; checking with supervisor as to any potential openings with Dr Luna Glasgow, based on recent schedule change. Please advise - cell ph#[phone].

## 2020-03-24 LAB — URIC ACID: Uric Acid, Serum: 9.5 mg/dL — ABNORMAL HIGH (ref 2.5–7.0)

## 2020-03-29 ENCOUNTER — Telehealth: Payer: Self-pay | Admitting: Orthopaedic Surgery

## 2020-03-29 ENCOUNTER — Encounter: Payer: Self-pay | Admitting: *Deleted

## 2020-03-29 DIAGNOSIS — M10072 Idiopathic gout, left ankle and foot: Secondary | ICD-10-CM

## 2020-03-29 MED ORDER — ALLOPURINOL 300 MG PO TABS: 300.0000 mg | ORAL_TABLET | Freq: Every day | ORAL | 2 refills | Status: DC

## 2020-03-29 NOTE — Progress Notes (Signed)
I informed patient per Dr. Luna Glasgow that she has Gout and he is sending in medicine for her at [facility] in [place].

## 2020-03-30 ENCOUNTER — Other Ambulatory Visit: Payer: Self-pay

## 2020-03-30 ENCOUNTER — Ambulatory Visit: Payer: BC Managed Care – PPO | Admitting: Orthopaedic Surgery

## 2020-03-30 ENCOUNTER — Encounter: Payer: Self-pay | Admitting: Orthopaedic Surgery

## 2020-03-30 DIAGNOSIS — M10072 Idiopathic gout, left ankle and foot: Secondary | ICD-10-CM

## 2020-03-30 NOTE — Progress Notes (Signed)
Patient VE:LFYBOF Beth Hamilton, female DOB:October 20, 1955, 64 y.o. BPZ:[id number]  Chief Complaint  Patient presents with  . Foot Pain    patient reports some better,    HPI  Beth Hamilton is a 64 y.o. female who had pain in the left foot.  I got serum uric acid which was elevated to 9.5. She had good results from the prednisone.  I called in allopurinol for her and she is now taking that.  She has just mild discomfort to the left foot now and she has no swelling or redness.   There is no height or weight on file to calculate BMI.  ROS  Review of Systems  Constitutional: Positive for activity change.  Musculoskeletal: Positive for arthralgias, gait problem and joint swelling.  All other systems reviewed and are negative.   All other systems reviewed and are negative.  The following is a summary of the past history medically, past history surgically, known current medicines, social history and family history.  This information is gathered electronically by the computer from prior information and documentation.  I review this each visit and have found including this information at this point in the chart is beneficial and informative.    Past Medical History:  Diagnosis Date  . Breast cancer ([place]) 04/20/14   left breast  . Complication of anesthesia    not sure what med was given in 96 with D&C, was very sore all over. Dr Patsey Berthold did anesthesia. Dr Heide Spark did surgery  . Hemorrhoids   . History of breast cancer 05/19/2015  . Hypertension   . Hypothyroidism   . IBD (inflammatory bowel disease)    UC  . Personal history of radiation therapy    2015  . Pneumonia   . Psoriasis   . Thyroid disease   . Ulcerative colitis ([place]) 5 1 2007   Dr Gala Romney    Past Surgical History:  Procedure Laterality Date  . BIOPSY  08/02/2019   Procedure: BIOPSY;  Surgeon: Daneil Dolin, MD;  Location: [facility];  Service: Endoscopy;;  colon  . BREAST BIOPSY    . BREAST LUMPECTOMY Left  04/20/2014  . COLONOSCOPY  10/09/2001   RMR: Internal hemorrhoids and anal papilla; otherwise normal rectum  . COLONOSCOPY  12/10/2005   incomplete tcs-  colitis  . COLONOSCOPY N/A 04/18/2014   Dr.Rourk- abnormal rectum & L colon into the mid descending segment. mucosa was erythematious as well as pale w/ some loss of normal vascular apttern. no erosions or ulcers. pt had some smooth peduculated 3-4cm polyps in the sigmoid segment most c/w pseudopolyps. the rest of the colonic mucosa appeared normal. bx= inflammatory polyps  . COLONOSCOPY WITH PROPOFOL N/A 08/02/2019   Procedure: COLONOSCOPY WITH PROPOFOL;  Surgeon: Daneil Dolin, MD;  Location: [facility];  Service: Endoscopy;  Laterality: N/A;  8:30am  . DILATION AND CURETTAGE OF UTERUS  1996   Dr Heide Spark  . FLEXIBLE SIGMOIDOSCOPY  02/26/2006   endoscopically normal-appearing rectum, colitis in sigmoid mucosa  . PARTIAL MASTECTOMY WITH NEEDLE LOCALIZATION AND AXILLARY SENTINEL LYMPH NODE BX Left 04/20/2014   Procedure: PARTIAL MASTECTOMY AFTER NEEDLE LOCALIZATION AND AXILLARY SENTINEL LYMPH NODE BX;  Surgeon: Jamesetta So, MD;  Location: [facility];  Service: General;  Laterality: Left;  . TUBAL LIGATION    . WISDOM TOOTH EXTRACTION      Family History  Problem Relation Age of Onset  . Stroke Maternal Grandfather   . Heart attack Maternal Grandfather   .  Hypertension Mother   . Diabetes Paternal Grandfather   . Heart disease Paternal Grandfather   . Colon cancer Neg Hx     Social History Social History   Tobacco Use  . Smoking status: Former Smoker    Packs/day: 0.00    Years: 15.00    Pack years: 0.00    Types: Cigarettes    Quit date: 07/26/2005    Years since quitting: 14.6  . Smokeless tobacco: Never Used  . Tobacco comment: quit in 2007  Vaping Use  . Vaping Use: Never used  Substance Use Topics  . Alcohol use: Yes    Comment: wine every day  . Drug use: No    Allergies  Allergen Reactions  . Flagyl  [Metronidazole]     Severe nausea and vomiting  . Other Other (See Comments)    Extreme muscle aches several days after surgery. PT SAID SHE WAS ALLERGIC TO SOME MEDICATION USED DURING A PROCEDURE BY DR Patsey Berthold YEARS AGO. SHE DOES NOT KNOW THE NAME OF THE MEDICATION, BUT SAID THAT IT MADE HER WEAK ALL OVER.   [place] [Levofloxacin In D5w]     Excessive abd pains and mucousy stool    Current Outpatient Medications  Medication Sig Dispense Refill  . acyclovir (ZOVIRAX) 400 MG tablet TAKE (2) TABLETS BY MOUTH TWICE DAILY. 20 tablet 11  . acyclovir ointment (ZOVIRAX) 5 % Apply 1 application topically every 4 (four) hours as needed. 30 g 3  . allopurinol (ZYLOPRIM) 300 MG tablet Take 1 tablet (300 mg total) by mouth daily. 30 tablet 2  . anastrozole (ARIMIDEX) 1 MG tablet Take 1 tablet (1 mg total) by mouth daily. 30 tablet 12  . ARMOUR THYROID 60 MG tablet Take 60 mg by mouth daily.     . B Complex Vitamins (VITAMIN-B COMPLEX PO) Take 1 mL by mouth daily. Vit b12    . balsalazide (COLAZAL) 750 MG capsule TAKE 3 CAPSULES BY MOUTH THREE TIMES DAILY. 270 capsule 5  . Calcium-Magnesium-Vitamin D (CALCIUM 1200+D3 PO) Take 2,400 Units by mouth daily. D3 5000    . Cyanocobalamin (VITAMIN B 12 PO) Take by mouth. Sublingual-once daily    . fluticasone (CUTIVATE) 0.05 % cream APPLY TO AFFECTED AREAS TWICE DAILY. 60 g 1  . folic acid (FOLVITE) 161 MCG tablet Take 400 mcg by mouth daily.     . hydrochlorothiazide (HYDRODIURIL) 25 MG tablet Take 1 tablet (25 mg total) by mouth daily. 90 tablet 1  . lisinopril (ZESTRIL) 20 MG tablet Take 1 tablet (20 mg total) by mouth daily. 90 tablet 1  . LORazepam (ATIVAN) 0.5 MG tablet Take when gets to airport and can repeat in 4 hours if needed 10 tablet 0  . predniSONE (STERAPRED UNI-PAK 21 TAB) 5 MG (21) TBPK tablet Take 6 pills first day; 5 pills second day; 4 pills third day; 3 pills fourth day; 2 pills next day and 1 pill last day. 21 tablet 0   No current  facility-administered medications for this visit.     Physical Exam  There were no vitals taken for this visit.  Constitutional: overall normal hygiene, normal nutrition, well developed, normal grooming, normal body habitus. Assistive device:none  Musculoskeletal: gait and station Limp none, muscle tone and strength are normal, no tremors or atrophy is present.  .  Neurological: coordination overall normal.  Deep tendon reflex/nerve stretch intact.  Sensation normal.  Cranial nerves II-XII intact.   Skin:   Normal overall no scars, lesions,  ulcers or rashes. No psoriasis.  Psychiatric: Alert and oriented x 3.  Recent memory intact, remote memory unclear.  Normal mood and affect. Well groomed.  Good eye contact.  Cardiovascular: overall no swelling, no varicosities, no edema bilaterally, normal temperatures of the legs and arms, no clubbing, cyanosis and good capillary refill.  Left foot with no redness, no swelling, NV intact.  No limp.  Lymphatic: palpation is normal.  All other systems reviewed and are negative   The patient has been educated about the nature of the problem(s) and counseled on treatment options.  The patient appeared to understand what I have discussed and is in agreement with it.  Encounter Diagnosis  Name Primary?  . Acute idiopathic gout involving toe of left foot Yes    PLAN Call if any problems.  Precautions discussed.  Continue current medications.   Return to clinic 6 weeks   Electronically Signed Sanjuana Kava, MD 9/9/20218:15 AM

## 2020-04-05 ENCOUNTER — Other Ambulatory Visit: Payer: Self-pay | Admitting: Family Medicine

## 2020-04-12 ENCOUNTER — Telehealth: Payer: Self-pay | Admitting: Orthopaedic Surgery

## 2020-04-12 NOTE — Telephone Encounter (Signed)
Noted. Calling patient as well per Dr Brooke Bonito response.

## 2020-04-12 NOTE — Telephone Encounter (Signed)
She needs to refill her allopurinol.  I will call in cochicine and some prednisone but Arbie Cookey, you need to send me a request for that.

## 2020-04-12 NOTE — Telephone Encounter (Signed)
Patient said she does have enough gout medication and does not need another refill of prednisone.  Asking if Dr Luna Glasgow may consider ordering pain medication for the "aching of her foot". Beth Hamilton has a few left from her surgeon in past of Hydrocodone.  [place].  Please advise.

## 2020-04-12 NOTE — Telephone Encounter (Signed)
Patient called to relay that she is having continuing pain in her foot, and said assuming it is related to her gout.  States she still has a few pills left of Allipurinol. Patient is aware of her next scheduled appointment here, 05/11/20; however, asking if there may be something else Dr Luna Glasgow can prescribe something?  If so, pharmacy is [facility], [place].

## 2020-04-13 MED ORDER — HYDROCODONE-ACETAMINOPHEN 5-325 MG PO TABS: 1.0000 | ORAL_TABLET | Freq: Four times a day (QID) | ORAL | 0 refills | Status: DC | PRN

## 2020-04-13 MED ORDER — COLCHICINE 0.6 MG PO TABS: ORAL_TABLET | ORAL | 3 refills | Status: DC

## 2020-04-13 MED ORDER — PREDNISONE 5 MG (21) PO TBPK: ORAL_TABLET | ORAL | 0 refills | Status: DC

## 2020-04-13 NOTE — Telephone Encounter (Signed)
As I stated, please send me request for colchicine.  Also send request for hydrocodone.

## 2020-04-13 NOTE — Telephone Encounter (Signed)
Done

## 2020-04-28 ENCOUNTER — Ambulatory Visit (HOSPITAL_COMMUNITY): Payer: BC Managed Care – PPO

## 2020-04-28 ENCOUNTER — Other Ambulatory Visit (HOSPITAL_COMMUNITY): Payer: Self-pay

## 2020-04-28 ENCOUNTER — Other Ambulatory Visit (HOSPITAL_COMMUNITY): Payer: BC Managed Care – PPO

## 2020-04-28 DIAGNOSIS — C50512 Malignant neoplasm of lower-outer quadrant of left female breast: Secondary | ICD-10-CM

## 2020-05-01 ENCOUNTER — Encounter (HOSPITAL_COMMUNITY): Payer: Self-pay

## 2020-05-01 ENCOUNTER — Inpatient Hospital Stay (HOSPITAL_COMMUNITY): Payer: BC Managed Care – PPO | Attending: Hematology

## 2020-05-01 ENCOUNTER — Other Ambulatory Visit: Payer: Self-pay

## 2020-05-01 ENCOUNTER — Ambulatory Visit (HOSPITAL_COMMUNITY): Payer: BC Managed Care – PPO | Admitting: Hematology

## 2020-05-01 ENCOUNTER — Ambulatory Visit (HOSPITAL_COMMUNITY)
Admission: RE | Admit: 2020-05-01 | Discharge: 2020-05-01 | Disposition: A | Discharge status: Home / Self Care | Payer: BC Managed Care – PPO | Source: Ambulatory Visit | Attending: Hematology | Admitting: Hematology

## 2020-05-01 DIAGNOSIS — Z170 Estrogen receptor positive status [ER+]: Secondary | ICD-10-CM | POA: Diagnosis not present

## 2020-05-01 DIAGNOSIS — M810 Age-related osteoporosis without current pathological fracture: Secondary | ICD-10-CM | POA: Diagnosis not present

## 2020-05-01 DIAGNOSIS — Z923 Personal history of irradiation: Secondary | ICD-10-CM | POA: Diagnosis not present

## 2020-05-01 DIAGNOSIS — C50512 Malignant neoplasm of lower-outer quadrant of left female breast: Secondary | ICD-10-CM | POA: Diagnosis not present

## 2020-05-01 DIAGNOSIS — Z79899 Other long term (current) drug therapy: Secondary | ICD-10-CM | POA: Insufficient documentation

## 2020-05-01 DIAGNOSIS — Z1231 Encounter for screening mammogram for malignant neoplasm of breast: Secondary | ICD-10-CM | POA: Insufficient documentation

## 2020-05-01 DIAGNOSIS — Z79811 Long term (current) use of aromatase inhibitors: Secondary | ICD-10-CM | POA: Diagnosis not present

## 2020-05-01 LAB — COMPREHENSIVE METABOLIC PANEL
ALT: 21 U/L (ref 0–44)
AST: 36 U/L (ref 15–41)
Albumin: 3.8 g/dL (ref 3.5–5.0)
Alkaline Phosphatase: 32 U/L — ABNORMAL LOW (ref 38–126)
Anion gap: 10 (ref 5–15)
BUN: 11 mg/dL (ref 8–23)
CO2: 29 mmol/L (ref 22–32)
Calcium: 9.4 mg/dL (ref 8.9–10.3)
Chloride: 100 mmol/L (ref 98–111)
Creatinine, Ser: 0.62 mg/dL (ref 0.44–1.00)
GFR, Estimated: >60 mL/min (ref >60)
Glucose, Bld: 151 mg/dL — ABNORMAL HIGH (ref 70–99)
Potassium: 3.9 mmol/L (ref 3.5–5.1)
Sodium: 139 mmol/L (ref 135–145)
Total Bilirubin: 0.9 mg/dL (ref 0.3–1.2)
Total Protein: 6.6 g/dL (ref 6.5–8.1)

## 2020-05-01 LAB — CBC WITH DIFFERENTIAL/PLATELET
Abs Immature Granulocytes: 0.03 10*3/uL (ref 0.00–0.07)
Basophils Absolute: 0 10*3/uL (ref 0.0–0.1)
Basophils Relative: 1 %
Eosinophils Absolute: 0.1 10*3/uL (ref 0.0–0.5)
Eosinophils Relative: 1 %
HCT: 40.1 % (ref 36.0–46.0)
Hemoglobin: 13 g/dL (ref 12.0–15.0)
Immature Granulocytes: 0 %
Lymphocytes Relative: 16 %
Lymphs Abs: 1.4 10*3/uL (ref 0.7–4.0)
MCH: 34.9 pg — ABNORMAL HIGH (ref 26.0–34.0)
MCHC: 32.4 g/dL (ref 30.0–36.0)
MCV: 107.5 fL — ABNORMAL HIGH (ref 80.0–100.0)
Monocytes Absolute: 0.6 10*3/uL (ref 0.1–1.0)
Monocytes Relative: 7 %
Neutro Abs: 6.2 10*3/uL (ref 1.7–7.7)
Neutrophils Relative %: 75 %
Platelets: 196 10*3/uL (ref 150–400)
RBC: 3.73 MIL/uL — ABNORMAL LOW (ref 3.87–5.11)
RDW: 12.5 % (ref 11.5–15.5)
WBC: 8.3 10*3/uL (ref 4.0–10.5)
nRBC: 0 % (ref 0.0–0.2)

## 2020-05-01 LAB — VITAMIN D 25 HYDROXY (VIT D DEFICIENCY, FRACTURES): Vit D, 25-Hydroxy: 66.85 ng/mL (ref 30–100)

## 2020-05-03 ENCOUNTER — Inpatient Hospital Stay (HOSPITAL_COMMUNITY): Payer: BC Managed Care – PPO | Admitting: Hematology

## 2020-05-03 ENCOUNTER — Other Ambulatory Visit: Payer: Self-pay

## 2020-05-03 VITALS — BP 170/93 | HR 98 | Temp 97.3°F | Resp 18 | Wt 199.9 lb

## 2020-05-03 DIAGNOSIS — Z170 Estrogen receptor positive status [ER+]: Secondary | ICD-10-CM

## 2020-05-03 DIAGNOSIS — C50512 Malignant neoplasm of lower-outer quadrant of left female breast: Secondary | ICD-10-CM

## 2020-05-03 NOTE — Progress Notes (Signed)
[facility] [address] [place] [address]   Patient Care Team: [name], MD as PCP - General (Family Medicine) Gala Romney, Cristopher Estimable, MD (Gastroenterology) Aviva Signs, MD as Consulting Physician (General Surgery) Florian Buff, MD as Consulting Physician (Obstetrics and Gynecology)  SUMMARY OF ONCOLOGIC HISTORY: Oncology History  Breast cancer of lower-outer quadrant of left female breast ([place])  05/24/2014 Initial Diagnosis   Breast cancer of lower-outer quadrant of left female breast   06/22/2014 - 08/05/2014 Radiation Therapy   Left breast 50 Gy at 2 Gy per fraction x 25 fractions with left breast boost 10 Gy at 2 Gy per fraction x 5 fractions by Dr. Pablo Ledger.     CHIEF COMPLIANT: Follow-up for left breast cancer   INTERVAL HISTORY: Beth Hamilton is a 64 y.o. female here today for follow up of her left breast cancer. Her last visit was on 04/29/2019.   Today Beth Hamilton reports feeling well. Beth Hamilton is tolerating the Arimidex well and denies having hot flashes or bone pains.   REVIEW OF SYSTEMS:   Review of Systems  Constitutional: Negative for appetite change and fatigue.  Endocrine: Negative for hot flashes.  Musculoskeletal: Negative for arthralgias.  All other systems reviewed and are negative.   I have reviewed the past medical history, past surgical history, social history and family history with the patient and they are unchanged from previous note.   ALLERGIES:   is allergic to flagyl [metronidazole], other, and levaquin [levofloxacin in d5w].   MEDICATIONS:  Current Outpatient Medications  Medication Sig Dispense Refill   acyclovir (ZOVIRAX) 400 MG tablet TAKE (2) TABLETS BY MOUTH TWICE DAILY. 20 tablet 11   acyclovir ointment (ZOVIRAX) 5 % Apply 1 application topically every 4 (four) hours as needed. 30 g 3   allopurinol (ZYLOPRIM) 300 MG tablet Take 1 tablet (300 mg total) by mouth daily. 30 tablet 2   anastrozole (ARIMIDEX) 1  MG tablet Take 1 tablet (1 mg total) by mouth daily. 30 tablet 12   ARMOUR THYROID 60 MG tablet Take 60 mg by mouth daily.      B Complex Vitamins (VITAMIN-B COMPLEX PO) Take 1 mL by mouth daily. Vit b12     balsalazide (COLAZAL) 750 MG capsule TAKE 3 CAPSULES BY MOUTH THREE TIMES DAILY. 270 capsule 5   Calcium-Magnesium-Vitamin D (CALCIUM 1200+D3 PO) Take 2,400 Units by mouth daily. D3 5000     Cyanocobalamin (VITAMIN B 12 PO) Take by mouth. Sublingual-once daily     fluticasone (CUTIVATE) 0.05 % cream APPLY TO AFFECTED AREAS TWICE DAILY. 60 g 1   folic acid (FOLVITE) 174 MCG tablet Take 400 mcg by mouth daily.      hydrochlorothiazide (HYDRODIURIL) 25 MG tablet Take 1 tablet (25 mg total) by mouth daily. 90 tablet 1   HYDROcodone-acetaminophen (NORCO/VICODIN) 5-325 MG tablet Take 1 tablet by mouth every 6 (six) hours as needed for moderate pain. 30 tablet 0   lisinopril (ZESTRIL) 20 MG tablet TAKE ONE TABLET BY MOUTH ONCE DAILY. 90 tablet 0   LORazepam (ATIVAN) 0.5 MG tablet Take when gets to airport and can repeat in 4 hours if needed 10 tablet 0   predniSONE (STERAPRED UNI-PAK 21 TAB) 5 MG (21) TBPK tablet Take 6 pills first day; 5 pills second day; 4 pills third day; 3 pills fourth day; 2 pills next day and 1 pill last day. 21 tablet 0   No current facility-administered medications for this visit.  PHYSICAL EXAMINATION: Performance status (ECOG): 0 - Asymptomatic  Vitals:   05/03/20 1120  BP: (!) 170/93  Pulse: 98  Resp: 18  Temp: (!) 97.3 F (36.3 C)  SpO2: 99%   Wt Readings from Last 3 Encounters:  05/03/20 199 lb 14.4 oz (90.7 kg)  03/22/20 195 lb (88.5 kg)  09/03/19 202 lb (91.6 kg)   Physical Exam Vitals reviewed.  Constitutional:      Appearance: Normal appearance. Beth Hamilton is obese.  Cardiovascular:     Rate and Rhythm: Normal rate and regular rhythm.     Pulses: Normal pulses.     Heart sounds: Normal heart sounds.  Pulmonary:     Effort: Pulmonary  effort is normal.     Breath sounds: Normal breath sounds.  Chest:     Breasts:        Right: Normal.        Left: Normal.  Abdominal:     Palpations: Abdomen is soft. There is no hepatomegaly, splenomegaly or mass.     Tenderness: There is no abdominal tenderness.     Hernia: No hernia is present.  Lymphadenopathy:     Cervical: No cervical adenopathy.     Upper Body:     Right upper body: No supraclavicular, axillary or pectoral adenopathy.     Left upper body: No supraclavicular, axillary or pectoral adenopathy.  Neurological:     General: No focal deficit present.     Mental Status: Beth Hamilton is alert and oriented to person, place, and time.  Psychiatric:        Mood and Affect: Mood normal.        Behavior: Behavior normal.     Breast Exam Chaperone: Milinda Antis, MD     LABORATORY DATA:  I have reviewed the data as listed CMP Latest Ref Rng & Units 05/01/2020 01/14/2020 01/03/2020  Glucose 70 - 99 mg/dL 151(H) 113(H) 143(H)  BUN 8 - 23 mg/dL _0 Creatinine 0.44 - 1.00 mg/dL 0.62 0.94 0.85  Sodium 135 - 145 mmol/L 139 143 138  Potassium 3.5 - 5.1 mmol/L 3.9 4.8 4.3  Chloride 98 - 111 mmol/L 100 100 97(L)  CO2 22 - 32 mmol/L _1 Calcium 8.9 - 10.3 mg/dL 9.4 9.3 9.7  Total Protein 6.5 - 8.1 g/dL 6.6 - 6.9  Total Bilirubin 0.3 - 1.2 mg/dL 0.9 - 0.6  Alkaline Phos 38 - 126 U/L 32(L) - 48  AST 15 - 41 U/L 36 - 33  ALT 0 - 44 U/L 21 - 22   No results found for: OBS962 Lab Results  Component Value Date   WBC 8.3 05/01/2020   HGB 13.0 05/01/2020   HCT 40.1 05/01/2020   MCV 107.5 (H) 05/01/2020   PLT 196 05/01/2020   NEUTROABS 6.2 05/01/2020    ASSESSMENT:  1.  Stage I (T1BN0) left breast cancer: -Status post left lumpectomy on 04/20/2014, 0.9 cm IDC, grade 1, ER/PR positive, HER-2 negative, Ki-67 of 10%. -Oncotype DX score of 7, underwent radiation therapy, started on anastrozole. -Patient opted to continue anastrozole beyond 5 years. -Mammogram on  05/01/2020 was BI-RADS Category 1.  2.  Osteoporosis: - DEXA scan in 2015 showed osteoporosis. -Beth Hamilton was started on Prolia every 6 months on 12/30/2014.  -DEXA scan in December 2017 showed T score of -2.4. -Repeat DEXA scan on 04/27/2019 showed T score of -2.1. -Beth Hamilton will continue calcium and vitamin D supplements.  Her vitamin D is within normal limits.  Beth Hamilton will continue Prolia at this time as long as Beth Hamilton is on anastrozole.   PLAN:  1.  Stage I (T1BN0) left breast cancer: -Beth Hamilton is tolerating anastrozole very well.  Beth Hamilton wanted to continue it beyond 5 years. -Physical exam today did not reveal any changes in her left lumpectomy in the upper inner quadrant.  No palpable masses. -Reviewed mammogram results with the patient. -Reviewed blood work from 05/01/2020 which is grossly within normal limits. -RTC 1 year with mammogram and labs.  2.  Osteoporosis: -Vitamin D 66.  Continue Prolia every 6 months.    Breast Cancer therapy associated bone loss: I have recommended calcium, Vitamin D and weight bearing exercises.  No orders of the defined types were placed in this encounter.  The patient has a good understanding of the overall plan. Beth Hamilton agrees with it. Beth Hamilton will call with any problems that may develop before the next visit here.    Derek Jack, MD [facility] [phone]   I, Milinda Antis, am acting as a scribe for Dr. Sanda Linger.  I, Derek Jack MD, have reviewed the above documentation for accuracy and completeness, and I agree with the above.

## 2020-05-03 NOTE — Patient Instructions (Signed)
[facility] at [facility] Discharge Instructions  You were seen today by Dr. Delton Coombes. He went over your recent results. You will be scheduled for a mammogram after 05/01/2021. Dr. Delton Coombes will see you back in 1 year for labs and follow up.   Thank you for choosing [facility] at [facility] to provide your oncology and hematology care.  To afford each patient quality time with our provider, please arrive at least 15 minutes before your scheduled appointment time.   If you have a lab appointment with the [facility] please come in thru the Main Entrance and check in at the main information desk  You need to re-schedule your appointment should you arrive 10 or more minutes late.  We strive to give you quality time with our providers, and arriving late affects you and other patients whose appointments are after yours.  Also, if you no show three or more times for appointments you may be dismissed from the clinic at the providers discretion.     Again, thank you for choosing [facility].  Our hope is that these requests will decrease the amount of time that you wait before being seen by our physicians.       _____________________________________________________________  Should you have questions after your visit to [facility], please contact our office at [phone] between the hours of 8:00 a.m. and 4:30 p.m.  Voicemails left after 4:00 p.m. will not be returned until the following business day.  For prescription refill requests, have your pharmacy contact our office and allow 72 hours.    Cancer Center Support Programs:   > Cancer Support Group  2nd Tuesday of the month 1pm-2pm, Journey Room

## 2020-05-05 ENCOUNTER — Encounter: Payer: Self-pay | Admitting: Adult Health

## 2020-05-05 ENCOUNTER — Other Ambulatory Visit: Payer: Self-pay

## 2020-05-05 ENCOUNTER — Ambulatory Visit (INDEPENDENT_AMBULATORY_CARE_PROVIDER_SITE_OTHER): Payer: BC Managed Care – PPO | Admitting: Adult Health

## 2020-05-05 VITALS — BP 137/94 | HR 89 | Ht 62.75 in | Wt 200.0 lb

## 2020-05-05 DIAGNOSIS — Z853 Personal history of malignant neoplasm of breast: Secondary | ICD-10-CM | POA: Diagnosis not present

## 2020-05-05 DIAGNOSIS — Z01419 Encounter for gynecological examination (general) (routine) without abnormal findings: Secondary | ICD-10-CM

## 2020-05-05 DIAGNOSIS — Z1211 Encounter for screening for malignant neoplasm of colon: Secondary | ICD-10-CM | POA: Insufficient documentation

## 2020-05-05 DIAGNOSIS — Z780 Asymptomatic menopausal state: Secondary | ICD-10-CM | POA: Insufficient documentation

## 2020-05-05 LAB — HEMOCCULT GUIAC POC 1CARD (OFFICE): Fecal Occult Blood, POC: NEGATIVE

## 2020-05-05 NOTE — Progress Notes (Signed)
Patient ID: Beth Hamilton, female   DOB: March 16, 1956, 64 y.o.   MRN: [id number] History of Present Illness:  Deloros is a 64 year old white female, divorced, PM in for a well woman gyn exam,she had a normal pap with negative HPV 05/29/2018.  She has had both COVID vaccines. And is working from home since last April.  PCP is [facility].  Current Medications, Allergies, Past Medical History, Past Surgical History, Family History and Social History were reviewed in [employer] record.     Review of Systems: Patient denies any headaches, hearing loss, fatigue, blurred vision, shortness of breath, chest pain, abdominal pain, problems with bowel movements, urination, or intercourse(not active). No joint pain or mood swings. She has had gout left big toe.   Physical Exam:BP (!) 137/94 (BP Location: Right Arm, Patient Position: Sitting, Cuff Size: Large)    Pulse 89    Ht 5' 2.75" (1.594 m)    Wt 200 lb (90.7 kg)    BMI 35.71 kg/m  General:  Well developed, well nourished, no acute distress Skin:  Warm and dry Neck:  Midline trachea, normal thyroid, good ROM, no lymphadenopathy,no carotid bruits heard. Lungs; Clear to auscultation bilaterally Breast:  No dominant palpable mass, retraction, or nipple discharge,had normal mammogram this week Cardiovascular: Regular rate and rhythm Abdomen:  Soft, non tender, no hepatosplenomegaly Pelvic:  External genitalia is normal in appearance, has small sebaceous cyst right labia.  The vagina is pale and atrophic. Urethra has no lesions or masses. The cervix is mooth.  Uterus is felt to be normal size, shape, and contour.  No adnexal masses or tenderness noted.Bladder is non tender, no masses felt. Rectal: Good sphincter tone, no polyps, or hemorrhoids felt.  Hemoccult negative. Extremities/musculoskeletal:  No swelling or varicosities noted, no clubbing or cyanosis Psych:  No mood changes, alert and cooperative,seems happy AA is 4 Fall  risk is low PHQ 9 score is 0  [place] - 05/05/20 0836      Pregnancy Intention Screening   Does the patient want to become pregnant in the next year? N/A    Does the patient's partner want to become pregnant in the next year? N/A    Would the patient like to discuss contraceptive options today? N/A      Contraception Wrap Up   Current Method Abstinence   postmenopausal   End Method Abstinence   postmenopausal   Contraception Counseling Provided No          Examination chaperoned by Levy Pupa LPN   Impression and Plan: 1. Encounter for well woman exam with routine gynecological exam Pap and physical in 1 year Labs with PCP and Dr Raliegh Ip Colonoscopy per Dr Gala Romney Mammogram yearly  2. Encounter for screening fecal occult blood testing  3. History of breast cancer  4. Postmenopause

## 2020-05-11 ENCOUNTER — Other Ambulatory Visit: Payer: Self-pay

## 2020-05-11 ENCOUNTER — Ambulatory Visit: Payer: BC Managed Care – PPO | Admitting: Orthopaedic Surgery

## 2020-05-11 ENCOUNTER — Encounter: Payer: Self-pay | Admitting: Orthopaedic Surgery

## 2020-05-11 VITALS — BP 180/106 | HR 104 | Ht 63.0 in | Wt 195.0 lb

## 2020-05-11 DIAGNOSIS — M10072 Idiopathic gout, left ankle and foot: Secondary | ICD-10-CM

## 2020-05-11 NOTE — Progress Notes (Signed)
Patient Beth Hamilton, female DOB:Sep 08, 1955, 64 y.o. DEY:[id number]  Chief Complaint  Patient presents with  . Gout    foot feels better     HPI  Beth Hamilton is a 64 y.o. female who is better after taking the allopurinol.  Again, I spent some time educating her about the gout and that she needs to take the allopurinol daily.  It has helped and her foot is not hurting.  I have explained it is hereditary.  She appears to understand.   Body mass index is 34.54 kg/m.  ROS  Review of Systems  Constitutional: Positive for activity change.  Musculoskeletal: Positive for arthralgias, gait problem and joint swelling.  All other systems reviewed and are negative.   All other systems reviewed and are negative.  The following is a summary of the past history medically, past history surgically, known current medicines, social history and family history.  This information is gathered electronically by the computer from prior information and documentation.  I review this each visit and have found including this information at this point in the chart is beneficial and informative.    Past Medical History:  Diagnosis Date  . Breast cancer ([place]) 04/20/14   left breast  . Complication of anesthesia    not sure what med was given in 96 with D&C, was very sore all over. Dr Patsey Berthold did anesthesia. Dr Heide Spark did surgery  . Gout   . Hemorrhoids   . History of breast cancer 05/19/2015  . Hypertension   . Hypothyroidism   . IBD (inflammatory bowel disease)    UC  . Personal history of radiation therapy    2015  . Pneumonia   . Psoriasis   . Thyroid disease   . Ulcerative colitis ([place]) 5 1 2007   Dr Gala Romney    Past Surgical History:  Procedure Laterality Date  . BIOPSY  08/02/2019   Procedure: BIOPSY;  Surgeon: Daneil Dolin, MD;  [facility];  Service: Endoscopy;;  colon  . BREAST BIOPSY    . BREAST LUMPECTOMY Left 04/20/2014  . COLONOSCOPY  10/09/2001   RMR:  Internal hemorrhoids and anal papilla; otherwise normal rectum  . COLONOSCOPY  12/10/2005   incomplete tcs-  colitis  . COLONOSCOPY N/A 04/18/2014   Dr.Rourk- abnormal rectum & L colon into the mid descending segment. mucosa was erythematious as well as pale w/ some loss of normal vascular apttern. no erosions or ulcers. pt had some smooth peduculated 3-4cm polyps in the sigmoid segment most c/w pseudopolyps. the rest of the colonic mucosa appeared normal. bx= inflammatory polyps  . COLONOSCOPY WITH PROPOFOL N/A 08/02/2019   Procedure: COLONOSCOPY WITH PROPOFOL;  Surgeon: Daneil Dolin, MD;  [facility];  Service: Endoscopy;  Laterality: N/A;  8:30am  . DILATION AND CURETTAGE OF UTERUS  1996   Dr Heide Spark  . FLEXIBLE SIGMOIDOSCOPY  02/26/2006   endoscopically normal-appearing rectum, colitis in sigmoid mucosa  . PARTIAL MASTECTOMY WITH NEEDLE LOCALIZATION AND AXILLARY SENTINEL LYMPH NODE BX Left 04/20/2014   Procedure: PARTIAL MASTECTOMY AFTER NEEDLE LOCALIZATION AND AXILLARY SENTINEL LYMPH NODE BX;  Surgeon: Jamesetta So, MD;  Location: [facility];  Service: General;  Laterality: Left;  . TUBAL LIGATION    . WISDOM TOOTH EXTRACTION      Family History  Problem Relation Age of Onset  . Stroke Maternal Grandfather   . Heart attack Maternal Grandfather   . Hypertension Mother   . Diabetes Paternal Grandfather   .  Heart disease Paternal Grandfather   . Colon cancer Neg Hx     Social History Social History   Tobacco Use  . Smoking status: Former Smoker    Packs/day: 0.00    Years: 15.00    Pack years: 0.00    Types: Cigarettes    Quit date: 07/26/2005    Years since quitting: 14.8  . Smokeless tobacco: Never Used  . Tobacco comment: quit in 2007  Vaping Use  . Vaping Use: Never used  Substance Use Topics  . Alcohol use: Yes    Comment: wine every day  . Drug use: No    Allergies  Allergen Reactions  . Flagyl [Metronidazole]     Severe nausea and vomiting  . Other  Other (See Comments)    Extreme muscle aches several days after surgery. PT SAID SHE WAS ALLERGIC TO SOME MEDICATION USED DURING A PROCEDURE BY DR Patsey Berthold YEARS AGO. SHE DOES NOT KNOW THE NAME OF THE MEDICATION, BUT SAID THAT IT MADE HER WEAK ALL OVER.   [place] [Levofloxacin In D5w]     Excessive abd pains and mucousy stool    Current Outpatient Medications  Medication Sig Dispense Refill  . acyclovir (ZOVIRAX) 400 MG tablet TAKE (2) TABLETS BY MOUTH TWICE DAILY. 20 tablet 11  . acyclovir ointment (ZOVIRAX) 5 % Apply 1 application topically every 4 (four) hours as needed. 30 g 3  . allopurinol (ZYLOPRIM) 300 MG tablet Take 1 tablet (300 mg total) by mouth daily. 30 tablet 2  . anastrozole (ARIMIDEX) 1 MG tablet Take 1 tablet (1 mg total) by mouth daily. 30 tablet 12  . ARMOUR THYROID 60 MG tablet Take 60 mg by mouth daily.     . B Complex Vitamins (VITAMIN-B COMPLEX PO) Take 1 mL by mouth daily. Vit b12    . balsalazide (COLAZAL) 750 MG capsule TAKE 3 CAPSULES BY MOUTH THREE TIMES DAILY. 270 capsule 5  . Calcium Citrate-Vitamin D (CITRACAL + D PO) Take by mouth.    . Cyanocobalamin (VITAMIN B 12 PO) Take by mouth. Sublingual-once daily    . fluticasone (CUTIVATE) 0.05 % cream APPLY TO AFFECTED AREAS TWICE DAILY. (Patient taking differently: APPLY TO AFFECTED AREAS TWICE DAILY PRN) 60 g 1  . folic acid (FOLVITE) 892 MCG tablet Take 400 mcg by mouth daily.     . hydrochlorothiazide (HYDRODIURIL) 25 MG tablet Take 1 tablet (25 mg total) by mouth daily. 90 tablet 1  . HYDROcodone-acetaminophen (NORCO/VICODIN) 5-325 MG tablet Take 1 tablet by mouth every 6 (six) hours as needed for moderate pain. 30 tablet 0  . lisinopril (ZESTRIL) 20 MG tablet TAKE ONE TABLET BY MOUTH ONCE DAILY. 90 tablet 0  . LORazepam (ATIVAN) 0.5 MG tablet Take when gets to airport and can repeat in 4 hours if needed 10 tablet 0   No current facility-administered medications for this visit.     Physical  Exam  Blood pressure (!) 180/106, pulse (!) 104, height 5' 3"  (1.6 m), weight 195 lb (88.5 kg).  Constitutional: overall normal hygiene, normal nutrition, well developed, normal grooming, normal body habitus. Assistive device:none  Musculoskeletal: gait and station Limp none, muscle tone and strength are normal, no tremors or atrophy is present.  .  Neurological: coordination overall normal.  Deep tendon reflex/nerve stretch intact.  Sensation normal.  Cranial nerves II-XII intact.   Skin:   Normal overall no scars, lesions, ulcers or rashes. No psoriasis.  Psychiatric: Alert and oriented x 3.  Recent  memory intact, remote memory unclear.  Normal mood and affect. Well groomed.  Good eye contact.  Cardiovascular: overall no swelling, no varicosities, no edema bilaterally, normal temperatures of the legs and arms, no clubbing, cyanosis and good capillary refill.  Lymphatic: palpation is normal.  All other systems reviewed and are negative   The patient has been educated about the nature of the problem(s) and counseled on treatment options.  The patient appeared to understand what I have discussed and is in agreement with it.  Encounter Diagnosis  Name Primary?  . Acute idiopathic gout involving toe of left foot Yes    PLAN Call if any problems.  Precautions discussed.  Continue current medications.   Return to clinic prn   Electronically Pettus, MD 10/21/20218:21 AM

## 2020-05-18 ENCOUNTER — Other Ambulatory Visit (HOSPITAL_COMMUNITY): Payer: Self-pay | Admitting: Psychiatry

## 2020-05-19 ENCOUNTER — Other Ambulatory Visit (HOSPITAL_COMMUNITY): Payer: Self-pay | Admitting: Hematology

## 2020-05-19 DIAGNOSIS — C50512 Malignant neoplasm of lower-outer quadrant of left female breast: Secondary | ICD-10-CM

## 2020-06-01 ENCOUNTER — Other Ambulatory Visit: Payer: Self-pay | Admitting: Family Medicine

## 2020-06-01 NOTE — Telephone Encounter (Signed)
Patient has appointment for 12/20 for medication followup

## 2020-06-01 NOTE — Telephone Encounter (Signed)
Left message

## 2020-06-20 ENCOUNTER — Other Ambulatory Visit: Payer: Self-pay | Admitting: Orthopaedic Surgery

## 2020-06-20 DIAGNOSIS — M10072 Idiopathic gout, left ankle and foot: Secondary | ICD-10-CM

## 2020-07-07 ENCOUNTER — Inpatient Hospital Stay (HOSPITAL_COMMUNITY): Payer: BC Managed Care – PPO

## 2020-07-07 ENCOUNTER — Inpatient Hospital Stay (HOSPITAL_COMMUNITY): Payer: BC Managed Care – PPO | Attending: Hematology

## 2020-07-07 ENCOUNTER — Ambulatory Visit (HOSPITAL_COMMUNITY): Payer: BC Managed Care – PPO

## 2020-07-07 ENCOUNTER — Other Ambulatory Visit (HOSPITAL_COMMUNITY): Payer: BC Managed Care – PPO

## 2020-07-07 ENCOUNTER — Other Ambulatory Visit (HOSPITAL_COMMUNITY): Payer: Self-pay | Admitting: Hematology

## 2020-07-07 ENCOUNTER — Other Ambulatory Visit: Payer: Self-pay

## 2020-07-07 ENCOUNTER — Encounter (HOSPITAL_COMMUNITY): Payer: Self-pay

## 2020-07-07 DIAGNOSIS — C50512 Malignant neoplasm of lower-outer quadrant of left female breast: Secondary | ICD-10-CM

## 2020-07-07 DIAGNOSIS — M810 Age-related osteoporosis without current pathological fracture: Secondary | ICD-10-CM | POA: Diagnosis present

## 2020-07-07 DIAGNOSIS — Z170 Estrogen receptor positive status [ER+]: Secondary | ICD-10-CM

## 2020-07-07 LAB — COMPREHENSIVE METABOLIC PANEL
ALT: 16 U/L (ref 0–44)
AST: 29 U/L (ref 15–41)
Albumin: 3.7 g/dL (ref 3.5–5.0)
Alkaline Phosphatase: 58 U/L (ref 38–126)
Anion gap: 11 (ref 5–15)
BUN: 14 mg/dL (ref 8–23)
CO2: 29 mmol/L (ref 22–32)
Calcium: 9.9 mg/dL (ref 8.9–10.3)
Chloride: 97 mmol/L — ABNORMAL LOW (ref 98–111)
Creatinine, Ser: 0.82 mg/dL (ref 0.44–1.00)
GFR, Estimated: >60 mL/min (ref >60)
Glucose, Bld: 204 mg/dL — ABNORMAL HIGH (ref 70–99)
Potassium: 4.1 mmol/L (ref 3.5–5.1)
Sodium: 137 mmol/L (ref 135–145)
Total Bilirubin: 0.7 mg/dL (ref 0.3–1.2)
Total Protein: 6.9 g/dL (ref 6.5–8.1)

## 2020-07-07 NOTE — Progress Notes (Signed)
Patient had concerns for prior authorization on Prolia injection.  Patient is returning next week for injection while authorization is being check.

## 2020-07-10 ENCOUNTER — Other Ambulatory Visit: Payer: Self-pay

## 2020-07-10 ENCOUNTER — Encounter: Payer: Self-pay | Admitting: Family Medicine

## 2020-07-10 ENCOUNTER — Ambulatory Visit: Payer: BC Managed Care – PPO | Admitting: Family Medicine

## 2020-07-10 VITALS — BP 146/74 | HR 101 | Temp 98.1°F | Wt 196.4 lb

## 2020-07-10 DIAGNOSIS — M10072 Idiopathic gout, left ankle and foot: Secondary | ICD-10-CM

## 2020-07-10 DIAGNOSIS — E7849 Other hyperlipidemia: Secondary | ICD-10-CM

## 2020-07-10 DIAGNOSIS — I10 Essential (primary) hypertension: Secondary | ICD-10-CM

## 2020-07-10 DIAGNOSIS — E038 Other specified hypothyroidism: Secondary | ICD-10-CM

## 2020-07-10 DIAGNOSIS — M1A0720 Idiopathic chronic gout, left ankle and foot, without tophus (tophi): Secondary | ICD-10-CM

## 2020-07-10 MED ORDER — AMLODIPINE BESYLATE 2.5 MG PO TABS: 2.5000 mg | ORAL_TABLET | Freq: Every day | ORAL | 2 refills | Status: DC

## 2020-07-10 MED ORDER — ALLOPURINOL 300 MG PO TABS: ORAL_TABLET | ORAL | 1 refills | Status: DC

## 2020-07-10 MED ORDER — HYDROCHLOROTHIAZIDE 25 MG PO TABS
25.0000 mg | ORAL_TABLET | Freq: Every day | ORAL | 1 refills | Status: DC
Start: 2020-07-10 — End: 2021-01-08

## 2020-07-10 MED ORDER — LISINOPRIL 20 MG PO TABS
20.0000 mg | ORAL_TABLET | Freq: Every day | ORAL | 1 refills | Status: DC
Start: 2020-07-10 — End: 2021-01-08

## 2020-07-10 NOTE — Progress Notes (Signed)
   Subjective:    Patient ID: Beth Hamilton, female    DOB: [birth year], 64 y.o.   MRN: [id number]  Hypertension This is a chronic problem. Pertinent negatives include no chest pain or shortness of breath. There are no compliance problems.     Other specified hypothyroidism  HTN (hypertension), benign - Plan: Basic metabolic panel  Chronic gout of left foot, unspecified cause - Plan: Uric acid  Other hyperlipidemia - Plan: Lipid panel  Acute idiopathic gout involving toe of left foot - Plan: allopurinol (ZYLOPRIM) 300 MG tablet   Blood pressure is not under good control.  She does try to watch her diet she tries to fit in some walking she states minimal salt use.  She tried taken to the diuretics daily to see if that will help but her blood pressure is remaining high  Recent gout placed on allopurinol she would like for [place] to manage this with ongoing prescription of allopurinol  Patient does need annual follow-up and lipid profile  She sees Dr. Buddy Duty for her hypothyroidism.  Review of Systems  Constitutional: Negative for activity change and appetite change.  HENT: Negative for congestion and rhinorrhea.   Respiratory: Negative for cough and shortness of breath.   Cardiovascular: Negative for chest pain and leg swelling.  Gastrointestinal: Negative for abdominal pain, nausea and vomiting.  Skin: Negative for color change.  Neurological: Negative for dizziness and weakness.  Psychiatric/Behavioral: Negative for agitation and confusion.       Objective:   Physical Exam Vitals reviewed.  Constitutional:      General: She is not in acute distress.    Appearance: She is well-nourished.  HENT:     Head: Normocephalic.  Cardiovascular:     Rate and Rhythm: Normal rate and regular rhythm.     Heart sounds: Normal heart sounds. No murmur heard.   Pulmonary:     Effort: Pulmonary effort is normal.     Breath sounds: Normal breath sounds.  Musculoskeletal:        General:  No edema.  Lymphadenopathy:     Cervical: No cervical adenopathy.  Neurological:     Mental Status: She is alert.  Psychiatric:        Behavior: Behavior normal.           Assessment & Plan:  1. Other specified hypothyroidism Followed by Dr. Buddy Duty  2. HTN (hypertension), benign Blood pressure subpar control.  Add amlodipine 2.5 mg daily.  Patient to do a follow-up blood pressure check in 4 weeks.  We will do that free of charge.  She will do the best she can at eating healthy staying physically active. - Basic metabolic panel  3. Chronic gout of left foot, unspecified cause Gout under good control currently takes allopurinol check uric acid level with next blood work. - Uric acid  4. Other hyperlipidemia Elevated cholesterol healthy diet recommended - Lipid panel  5. Acute idiopathic gout involving toe of left foot See above - allopurinol (ZYLOPRIM) 300 MG tablet; TAKE (1) TABLET BY MOUTH ONCE DAILY.  Dispense: 90 tablet; Refill: 1

## 2020-07-12 ENCOUNTER — Inpatient Hospital Stay (HOSPITAL_COMMUNITY): Payer: BC Managed Care – PPO

## 2020-07-12 ENCOUNTER — Other Ambulatory Visit: Payer: Self-pay

## 2020-07-12 VITALS — BP 140/63 | HR 89 | Temp 96.9°F | Resp 18

## 2020-07-12 DIAGNOSIS — M810 Age-related osteoporosis without current pathological fracture: Secondary | ICD-10-CM

## 2020-07-12 MED ORDER — DENOSUMAB 60 MG/ML ~~LOC~~ SOSY
60.0000 mg | PREFILLED_SYRINGE | Freq: Once | SUBCUTANEOUS | Status: AC
  Administered 2020-07-12: 12:00:00 60 mg via SUBCUTANEOUS
  Filled 2020-07-12: qty 1

## 2020-07-12 NOTE — Progress Notes (Signed)
Patient tolerated Prolia injection with no complaints voiced.  Site clean and dry with no bruising or swelling noted.  No complaints of pain.  Discharged with vital signs stable and no signs or symptoms of distress noted.

## 2020-07-20 LAB — BASIC METABOLIC PANEL
BUN/Creatinine Ratio: 18 (ref 12–28)
BUN: 16 mg/dL (ref 8–27)
CO2: 27 mmol/L (ref 20–29)
Calcium: 9 mg/dL (ref 8.7–10.3)
Chloride: 100 mmol/L (ref 96–106)
Creatinine, Ser: 0.87 mg/dL (ref 0.57–1.00)
GFR calc Af Amer: 83 mL/min/{1.73_m2} (ref >59)
GFR calc non Af Amer: 72 mL/min/{1.73_m2} (ref >59)
Glucose: 119 mg/dL — ABNORMAL HIGH (ref 65–99)
Potassium: 4.5 mmol/L (ref 3.5–5.2)
Sodium: 142 mmol/L (ref 134–144)

## 2020-07-20 LAB — LIPID PANEL
Chol/HDL Ratio: 3.3 ratio (ref 0.0–4.4)
Cholesterol, Total: 229 mg/dL — ABNORMAL HIGH (ref 100–199)
HDL: 69 mg/dL (ref >39)
LDL Chol Calc (NIH): 126 mg/dL — ABNORMAL HIGH (ref 0–99)
Triglycerides: 193 mg/dL — ABNORMAL HIGH (ref 0–149)
VLDL Cholesterol Cal: 34 mg/dL (ref 5–40)

## 2020-07-20 LAB — URIC ACID: Uric Acid: 3.7 mg/dL (ref 3.0–7.2)

## 2020-07-22 ENCOUNTER — Encounter: Payer: Self-pay | Admitting: Family Medicine

## 2020-08-10 ENCOUNTER — Ambulatory Visit: Payer: BC Managed Care – PPO | Admitting: Family Medicine

## 2020-08-14 ENCOUNTER — Telehealth: Payer: Self-pay | Admitting: Internal Medicine

## 2020-08-14 NOTE — Telephone Encounter (Signed)
Routing to [facility] refill box

## 2020-08-14 NOTE — Telephone Encounter (Signed)
Patient needs her Balsalazide 750 [facility] [place]

## 2020-08-15 ENCOUNTER — Other Ambulatory Visit: Payer: Self-pay | Admitting: Gastroenterology

## 2020-08-15 DIAGNOSIS — K529 Noninfective gastroenteritis and colitis, unspecified: Secondary | ICD-10-CM

## 2020-08-15 MED ORDER — BALSALAZIDE DISODIUM 750 MG PO CAPS: 2250.0000 mg | ORAL_CAPSULE | Freq: Three times a day (TID) | ORAL | 5 refills | Status: DC

## 2020-08-15 NOTE — Telephone Encounter (Signed)
Noted  

## 2020-08-15 NOTE — Telephone Encounter (Signed)
Rx sent. Patient is aware.

## 2020-08-24 ENCOUNTER — Encounter: Payer: Self-pay | Admitting: Internal Medicine

## 2020-08-30 ENCOUNTER — Telehealth: Payer: Self-pay | Admitting: Internal Medicine

## 2020-08-30 NOTE — Telephone Encounter (Signed)
Patient called and is having a colitis flair up and needs something called in [phone]

## 2020-08-30 NOTE — Telephone Encounter (Signed)
Spoke with pt. Pt is having a colitis flare. Pt states she's had this for years and she would like something to get back on track before it gets worse. Pt's stool isn't solid and pt is having an urgency to have a bowel movement. Budesonide didn't help pt in the past.

## 2020-08-31 MED ORDER — PREDNISONE 10 MG PO TABS: ORAL_TABLET | ORAL | 0 refills | Status: DC

## 2020-08-31 NOTE — Telephone Encounter (Signed)
Pt needs ov.  Routing to [place] to schedule.

## 2020-08-31 NOTE — Addendum Note (Signed)
Addended by: Mahala Menghini on: 08/31/2020 11:01 AM   Modules accepted: Orders

## 2020-08-31 NOTE — Telephone Encounter (Signed)
Done

## 2020-08-31 NOTE — Telephone Encounter (Signed)
Spoke with pt. Pt would [facility]. I've taken [place] off of pts pharmacy list.

## 2020-08-31 NOTE — Telephone Encounter (Signed)
Prednisone 55m daily for 7 days, 30 mg daily for 7 days, 215mdaily for 7 days, 10 mg daily for 7 days sent to [place] Please tell pt to keep us[place]osted if symptoms do not settle down.  Please arrange for colonoscopy with Dr. RoGala Romneyor her yearly follow up.

## 2020-08-31 NOTE — Telephone Encounter (Signed)
Noted  

## 2020-08-31 NOTE — Telephone Encounter (Signed)
Called patient and scheduled her with Dr. Gala Romney

## 2020-08-31 NOTE — Addendum Note (Signed)
Addended by: Mahala Menghini on: 08/31/2020 09:10 AM   Modules accepted: Orders

## 2020-09-22 ENCOUNTER — Other Ambulatory Visit: Payer: Self-pay

## 2020-09-22 ENCOUNTER — Ambulatory Visit: Payer: BC Managed Care – PPO | Admitting: Internal Medicine

## 2020-09-22 ENCOUNTER — Encounter: Payer: Self-pay | Admitting: Internal Medicine

## 2020-09-22 VITALS — BP 175/96 | HR 96 | Temp 96.8°F | Ht 63.0 in | Wt 200.0 lb

## 2020-09-22 DIAGNOSIS — Z79899 Other long term (current) drug therapy: Secondary | ICD-10-CM

## 2020-09-22 DIAGNOSIS — Z1159 Encounter for screening for other viral diseases: Secondary | ICD-10-CM

## 2020-09-22 DIAGNOSIS — K523 Indeterminate colitis: Secondary | ICD-10-CM | POA: Diagnosis not present

## 2020-09-22 MED ORDER — PREDNISONE 10 MG PO TABS: 30.0000 mg | ORAL_TABLET | Freq: Every day | ORAL | 3 refills | Status: DC

## 2020-09-22 NOTE — Patient Instructions (Addendum)
Increase prednisone to 30 mg daily (disp 90 - 10 mg tablets) with 3 refills  Continue Balsalazide  Hepatitis B surface antigen and PPD   We will check on feasibility of obtaining Humira  OV here in 6 weeks

## 2020-09-22 NOTE — Progress Notes (Signed)
Primary Care Physician:  [name], MD Primary Gastroenterologist:  Dr. Gala Romney  Pre-Procedure History & Physical: HPI:  Beth Hamilton is a 65 y.o. female here for follow-up of moderately severe left-sided colitis.  History of indeterminate colitis last colonoscopy about a year ago demonstrated some deep ulcerations in the left colon with relative rectal sparing.  Diagnosis of inflammatory bowel disease felt to be well-established.  Has been maintained on full dose of balsalazide over the past year flare back in December started on prednisone here a couple of weeks ago with a rapid taper.  States she was doing "pretty good" over the past year but continues to have abdominal cramps rumbling;  quite a bit of mucus in her stools - no blood. Much worse since December  -  settled down a bit with prednisone coming off of 20 mg daily transitioning to 10 today. Her daily routine somewhat dependent on bowel function/bathroom availability.  She has quite a bit of other issues in her life producing significant stress.  She has osteopenia on Prolia.  History of breast cancer in remission. She takes no NSAIDs.  No smoking.  Only endorses 2 to 3 glasses of red wine nightly.  Denies any more than that amount.  Past Medical History:  Diagnosis Date  . Breast cancer ([place]) 04/20/14   left breast  . Complication of anesthesia    not sure what med was given in 96 with D&C, was very sore all over. Dr Patsey Berthold did anesthesia. Dr Heide Spark did surgery  . Gout   . Hemorrhoids   . History of breast cancer 05/19/2015  . Hypertension   . Hypothyroidism   . IBD (inflammatory bowel disease)    [facility]  . Personal history of radiation therapy    2015  . Pneumonia   . Psoriasis   . Thyroid disease   . Ulcerative colitis ([place]) 5 1 2007   Dr Gala Romney    Past Surgical History:  Procedure Laterality Date  . BIOPSY  08/02/2019   Procedure: BIOPSY;  Surgeon: Daneil Dolin, MD;  [facility];  Service:  Endoscopy;;  colon  . BREAST BIOPSY    . BREAST LUMPECTOMY Left 04/20/2014  . COLONOSCOPY  10/09/2001   RMR: Internal hemorrhoids and anal papilla; otherwise normal rectum  . COLONOSCOPY  12/10/2005   incomplete tcs-  colitis  . COLONOSCOPY N/A 04/18/2014   Dr.Zurich Carreno- abnormal rectum & L colon into the mid descending segment. mucosa was erythematious as well as pale w/ some loss of normal vascular apttern. no erosions or ulcers. pt had some smooth peduculated 3-4cm polyps in the sigmoid segment most c/w pseudopolyps. the rest of the colonic mucosa appeared normal. bx= inflammatory polyps  . COLONOSCOPY WITH PROPOFOL N/A 08/02/2019   Procedure: COLONOSCOPY WITH PROPOFOL;  Surgeon: Daneil Dolin, MD;  [facility];  Service: Endoscopy;  Laterality: N/A;  8:30am  . DILATION AND CURETTAGE OF UTERUS  1996   Dr Heide Spark  . FLEXIBLE SIGMOIDOSCOPY  02/26/2006   endoscopically normal-appearing rectum, colitis in sigmoid mucosa  . PARTIAL MASTECTOMY WITH NEEDLE LOCALIZATION AND AXILLARY SENTINEL LYMPH NODE BX Left 04/20/2014   Procedure: PARTIAL MASTECTOMY AFTER NEEDLE LOCALIZATION AND AXILLARY SENTINEL LYMPH NODE BX;  Surgeon: Jamesetta So, MD;  Location: [facility];  Service: General;  Laterality: Left;  . TUBAL LIGATION    . WISDOM TOOTH EXTRACTION      Prior to Admission medications   Medication Sig Start Date  End Date Taking? Authorizing Provider  allopurinol (ZYLOPRIM) 300 MG tablet TAKE (1) TABLET BY MOUTH ONCE DAILY. 07/10/20  Yes [name], MD  amLODipine (NORVASC) 2.5 MG tablet Take 1 tablet (2.5 mg total) by mouth daily. 07/10/20  Yes [name], MD  anastrozole (ARIMIDEX) 1 MG tablet TAKE ONE TABLET BY MOUTH ONCE DAILY. 05/19/20  Yes Derek Jack, MD  ARMOUR THYROID 60 MG tablet Take 60 mg by mouth daily.  11/16/18  Yes [provider]  B Complex Vitamins (VITAMIN-B COMPLEX PO) Take 1 mL by mouth daily. Vit b12   Yes [provider]  balsalazide  (COLAZAL) 750 MG capsule Take 3 capsules (2,250 mg total) by mouth 3 (three) times daily. 08/15/20  Yes Erenest Rasher, PA-C  Calcium Citrate-Vitamin D (CITRACAL + D PO) Take by mouth.   Yes [provider]  fluticasone (CUTIVATE) 0.05 % cream APPLY TO AFFECTED AREAS TWICE DAILY. Patient taking differently: APPLY TO AFFECTED AREAS TWICE DAILY PRN 12/18/18  Yes Luking, Elayne Snare, MD  folic acid (FOLVITE) 121 MCG tablet Take 400 mcg by mouth daily.    Yes [provider]  hydrochlorothiazide (HYDRODIURIL) 25 MG tablet Take 1 tablet (25 mg total) by mouth daily. 07/10/20  Yes Luking, Elayne Snare, MD  lisinopril (ZESTRIL) 20 MG tablet Take 1 tablet (20 mg total) by mouth daily. 07/10/20  Yes [name], MD  LORazepam (ATIVAN) 0.5 MG tablet Take when gets to airport and can repeat in 4 hours if needed 05/29/18  Yes Derrek Monaco A, NP  predniSONE (DELTASONE) 10 MG tablet Take 5m po daily for 7 days. 347mpo daily for 7 days. 2017mo daily for 7 days. 66m52m daily for 7 days. 08/31/20  Yes LewiMahala Menghini-C    Allergies as of 09/22/2020 - Review Complete 09/22/2020  Allergen Reaction Noted  . Flagyl [metronidazole]  10/29/2013  . Other Other (See Comments) 03/22/2014  . Levaquin [levofloxacin in d5w]  07/23/2013    Family History  Problem Relation Age of Onset  . Stroke Maternal Grandfather   . Heart attack Maternal Grandfather   . Hypertension Mother   . Diabetes Paternal Grandfather   . Heart disease Paternal Grandfather   . Colon cancer Neg Hx     Social History   Socioeconomic History  . Marital status: Divorced    Spouse name: Not on file  . Number of children: 1  . Years of education: Not on file  . Highest education level: Not on file  Occupational History  . Not on file  Tobacco Use  . Smoking status: Former Smoker    Packs/day: 0.00    Years: 15.00    Pack years: 0.00    Types: Cigarettes    Quit date: 07/26/2005    Years since quitting: 15.1   . Smokeless tobacco: Never Used  . Tobacco comment: quit in 2007  Vaping Use  . Vaping Use: Never used  Substance and Sexual Activity  . Alcohol use: Yes    Comment: wine every day  . Drug use: No  . Sexual activity: Not Currently    Birth control/protection: Post-menopausal, Abstinence  Other Topics Concern  . Not on file  Social History Narrative  . Not on file   Social Determinants of Health   Financial Resource Strain: Low Risk   . Difficulty of Paying Living Expenses: Not hard at all  Food Insecurity: No Food Insecurity  . Worried About [employer]  Food in the Last Year: Never true  . Ran Out of Food in the Last Year: Never true  Transportation Needs: No Transportation Needs  . Lack of Transportation (Medical): No  . Lack of Transportation (Non-Medical): No  Physical Activity: Insufficiently Active  . Days of Exercise per Week: 2 days  . Minutes of Exercise per Session: 20 min  Stress: No Stress Concern Present  . Feeling of Stress : Not at all  Social Connections: Moderately Isolated  . Frequency of Communication with Friends and Family: Three times a week  . Frequency of Social Gatherings with Friends and Family: Once a week  . Attends Religious Services: More than 4 times per year  . Active Member of Clubs or Organizations: No  . Attends Archivist Meetings: Never  . Marital Status: Separated  Intimate Partner Violence: Not At Risk  . Fear of Current or Ex-Partner: No  . Emotionally Abused: No  . Physically Abused: No  . Sexually Abused: No    Review of Systems: See HPI, otherwise negative ROS  Physical Exam: BP (!) 175/96   Pulse 96   Temp (!) 96.8 F (36 C)   Ht 5' 3"  (1.6 m)   Wt 200 lb (90.7 kg)   BMI 35.43 kg/m  General:   Alert,  Well-developed, well-nourished, pleasant and cooperative in NAD Neck:  Supple; no masses or thyromegaly. No significant cervical adenopathy. Lungs:  Clear throughout to auscultation.   No wheezes,  crackles, or rhonchi. No acute distress. Heart:  Regular rate and rhythm; no murmurs, clicks, rubs,  or gallops. Abdomen: Obese.  Positive bowel sounds soft nontender without appreciable mass organomegaly pulses:  Normal pulses noted. Extremities:  Without clubbing or edema.  Impression/Plan: 65 year old lady with a long history of indeterminate colitis.  Patient with more significant endoscopic histologic findings on her last colonoscopy.  Phenotypically, more reminiscent of Crohn's colitis. She does have suboptimally controlled moderately severe colitis.  Mesalamine and recent prednisone have really provided suboptimal results.  Lengthy discussion with patient about revisiting biologic therapy.  This would likely be of great benefit hardness in her inflammatory bowel disease and improving her overall clinical status / quality of life.  She is interested in moving in that direction.  Recommendations:  Increase prednisone to 30 mg daily (disp 90 - 10 mg tablets) with 3 refills  Continue Balsalazide  Hepatitis B surface antigen and TB screen  We will check on feasibility of obtaining Humira  Further recommendations to follow next week.  OV here in 6 weeks    Notice: This dictation was prepared with Dragon dictation along with smaller phrase technology. Any transcriptional errors that result from this process are unintentional and may not be corrected upon review.

## 2020-09-26 LAB — HEPATITIS B SURFACE ANTIGEN: Hepatitis B Surface Ag: NEGATIVE

## 2020-09-28 LAB — QUANTIFERON-TB GOLD PLUS
QuantiFERON Mitogen Value: 1.86 IU/mL
QuantiFERON Nil Value: 0.04 IU/mL
QuantiFERON TB1 Ag Value: 0.07 IU/mL
QuantiFERON TB2 Ag Value: 0.07 IU/mL
QuantiFERON-TB Gold Plus: NEGATIVE

## 2020-10-11 ENCOUNTER — Ambulatory Visit: Payer: BC Managed Care – PPO | Admitting: Gastroenterology

## 2020-10-31 ENCOUNTER — Other Ambulatory Visit: Payer: Self-pay

## 2020-10-31 ENCOUNTER — Telehealth: Payer: Self-pay

## 2020-10-31 ENCOUNTER — Ambulatory Visit: Payer: BC Managed Care – PPO | Admitting: Internal Medicine

## 2020-10-31 ENCOUNTER — Encounter: Payer: Self-pay | Admitting: Internal Medicine

## 2020-10-31 VITALS — BP 184/100 | HR 101 | Temp 97.9°F | Ht 63.0 in | Wt 202.0 lb

## 2020-10-31 DIAGNOSIS — K529 Noninfective gastroenteritis and colitis, unspecified: Secondary | ICD-10-CM

## 2020-10-31 NOTE — Patient Instructions (Signed)
Glad you are feeling much better.  For now, we will continue prednisone 30 mg daily  Continue balsalazide  Long-term goal is to get you on a biologic agent like Humira.  Waiting to hear from insurance carrier  We will plan for prednisone taper once you started on a biologic  I anticipate making further recommendations in the next few days once more information is obtained

## 2020-10-31 NOTE — Progress Notes (Signed)
Primary Care Physician:  [name], MD Primary Gastroenterologist:  Dr. Gala Romney  Pre-Procedure History & Physical: HPI:  Beth Hamilton is a 65 y.o. female here for follow-up of indeterminate colitis.  Patient pleased to say she is back to baseline on prednisone 30 mg daily and balsalazide.  1-3 formed bowel movements daily no tenesmus, mucus or bleeding.  No abdominal pain.  Feels well. TB and hepatitis B surface antigen came back negative.  Still waiting to hear from the insurance carrier regarding Humira.  Past Medical History:  Diagnosis Date  . Breast cancer ([place]) 04/20/14   left breast  . Complication of anesthesia    not sure what med was given in 96 with D&C, was very sore all over. Dr Patsey Berthold did anesthesia. Dr Heide Spark did surgery  . Gout   . Hemorrhoids   . History of breast cancer 05/19/2015  . Hypertension   . Hypothyroidism   . IBD (inflammatory bowel disease)    [facility]  . Personal history of radiation therapy    2015  . Pneumonia   . Psoriasis   . Thyroid disease   . Ulcerative colitis ([place]) 5 1 2007   Dr Gala Romney    Past Surgical History:  Procedure Laterality Date  . BIOPSY  08/02/2019   Procedure: BIOPSY;  Surgeon: Daneil Dolin, MD;  Location: [facility];  Service: Endoscopy;;  colon  . BREAST BIOPSY    . BREAST LUMPECTOMY Left 04/20/2014  . COLONOSCOPY  10/09/2001   RMR: Internal hemorrhoids and anal papilla; otherwise normal rectum  . COLONOSCOPY  12/10/2005   incomplete tcs-  colitis  . COLONOSCOPY N/A 04/18/2014   Dr.Glenda Kunst- abnormal rectum & L colon into the mid descending segment. mucosa was erythematious as well as pale w/ some loss of normal vascular apttern. no erosions or ulcers. pt had some smooth peduculated 3-4cm polyps in the sigmoid segment most c/w pseudopolyps. the rest of the colonic mucosa appeared normal. bx= inflammatory polyps  . COLONOSCOPY WITH PROPOFOL N/A 08/02/2019   Procedure: COLONOSCOPY WITH PROPOFOL;  Surgeon: Daneil Dolin, MD;  Location: [facility];  Service: Endoscopy;  Laterality: N/A;  8:30am  . DILATION AND CURETTAGE OF UTERUS  1996   Dr Heide Spark  . FLEXIBLE SIGMOIDOSCOPY  02/26/2006   endoscopically normal-appearing rectum, colitis in sigmoid mucosa  . PARTIAL MASTECTOMY WITH NEEDLE LOCALIZATION AND AXILLARY SENTINEL LYMPH NODE BX Left 04/20/2014   Procedure: PARTIAL MASTECTOMY AFTER NEEDLE LOCALIZATION AND AXILLARY SENTINEL LYMPH NODE BX;  Surgeon: Jamesetta So, MD;  Location: [facility];  Service: General;  Laterality: Left;  . TUBAL LIGATION    . WISDOM TOOTH EXTRACTION      Prior to Admission medications   Medication Sig Start Date End Date Taking? Authorizing Provider  allopurinol (ZYLOPRIM) 300 MG tablet TAKE (1) TABLET BY MOUTH ONCE DAILY. 07/10/20  Yes [name], MD  amLODipine (NORVASC) 2.5 MG tablet Take 1 tablet (2.5 mg total) by mouth daily. 07/10/20  Yes [name], MD  anastrozole (ARIMIDEX) 1 MG tablet TAKE ONE TABLET BY MOUTH ONCE DAILY. 05/19/20  Yes Derek Jack, MD  ARMOUR THYROID 60 MG tablet Take 60 mg by mouth daily.  11/16/18  Yes [provider]  B Complex Vitamins (VITAMIN-B COMPLEX PO) Take 1 mL by mouth daily. Vit b12   Yes [provider]  balsalazide (COLAZAL) 750 MG capsule Take 3 capsules (2,250 mg total) by mouth 3 (three) times daily. 08/15/20  Yes Aliene Altes S, PA-C  Calcium Citrate-Vitamin D (CITRACAL + D PO) Take by mouth.   Yes [provider]  fluticasone (CUTIVATE) 0.05 % cream APPLY TO AFFECTED AREAS TWICE DAILY. Patient taking differently: APPLY TO AFFECTED AREAS TWICE DAILY PRN 12/18/18  Yes Luking, Elayne Snare, MD  folic acid (FOLVITE) 970 MCG tablet Take 400 mcg by mouth daily.    Yes [provider]  hydrochlorothiazide (HYDRODIURIL) 25 MG tablet Take 1 tablet (25 mg total) by mouth daily. 07/10/20  Yes Luking, Elayne Snare, MD  lisinopril (ZESTRIL) 20 MG tablet Take 1 tablet (20 mg total) by mouth daily.  07/10/20  Yes [name], MD  LORazepam (ATIVAN) 0.5 MG tablet Take when gets to airport and can repeat in 4 hours if needed 05/29/18  Yes Derrek Monaco A, NP  predniSONE (DELTASONE) 10 MG tablet Take 3 tablets (30 mg total) by mouth daily with breakfast. 09/22/20  Yes Calen Geister, Cristopher Estimable, MD  predniSONE (DELTASONE) 10 MG tablet Take 71m po daily for 7 days. 396mpo daily for 7 days. 2066mo daily for 7 days. 39m83m daily for 7 days. Patient not taking: Reported on 10/31/2020 08/31/20   LewiMahala Menghini-C    Allergies as of 10/31/2020 - Review Complete 10/31/2020  Allergen Reaction Noted  . Flagyl [metronidazole]  10/29/2013  . Other Other (See Comments) 03/22/2014  . Levaquin [levofloxacin in d5w]  07/23/2013    Family History  Problem Relation Age of Onset  . Stroke Maternal Grandfather   . Heart attack Maternal Grandfather   . Hypertension Mother   . Diabetes Paternal Grandfather   . Heart disease Paternal Grandfather   . Colon cancer Neg Hx     Social History   Socioeconomic History  . Marital status: Divorced    Spouse name: Not on file  . Number of children: 1  . Years of education: Not on file  . Highest education level: Not on file  Occupational History  . Not on file  Tobacco Use  . Smoking status: Former Smoker    Packs/day: 0.00    Years: 15.00    Pack years: 0.00    Types: Cigarettes    Quit date: 07/26/2005    Years since quitting: 15.2  . Smokeless tobacco: Never Used  . Tobacco comment: quit in 2007  Vaping Use  . Vaping Use: Never used  Substance and Sexual Activity  . Alcohol use: Yes    Comment: wine every day  . Drug use: No  . Sexual activity: Not Currently    Birth control/protection: Post-menopausal, Abstinence  Other Topics Concern  . Not on file  Social History Narrative  . Not on file   Social Determinants of Health   Financial Resource Strain: Low Risk   . Difficulty of Paying Living Expenses: Not hard at all  Food Insecurity:  No Food Insecurity  . Worried About RunnCharity fundraiserthe Last Year: Never true  . Ran Out of Food in the Last Year: Never true  Transportation Needs: No Transportation Needs  . Lack of Transportation (Medical): No  . Lack of Transportation (Non-Medical): No  Physical Activity: Insufficiently Active  . Days of Exercise per Week: 2 days  . Minutes of Exercise per Session: 20 min  Stress: No Stress Concern Present  . Feeling of Stress : Not at all  Social Connections: Moderately Isolated  . Frequency of Communication with Friends and Family: Three times a week  .  Frequency of Social Gatherings with Friends and Family: Once a week  . Attends Religious Services: More than 4 times per year  . Active Member of Clubs or Organizations: No  . Attends Archivist Meetings: Never  . Marital Status: Separated  Intimate Partner Violence: Not At Risk  . Fear of Current or Ex-Partner: No  . Emotionally Abused: No  . Physically Abused: No  . Sexually Abused: No    Review of Systems: See HPI, otherwise negative ROS  Physical Exam: BP (!) 184/100   Pulse (!) 101   Temp 97.9 F (36.6 C)   Ht 5' 3"  (1.6 m)   Wt 202 lb (91.6 kg)   BMI 35.78 kg/m  General:   Alert,   pleasant and cooperative in NAD Abdomen: Non-distended, normal bowel sounds.  Soft and nontender without appreciable mass or hepatosplenomegaly.  Pulses:  Normal pulses noted. Extremities:  Without clubbing or edema.  Impression/Plan: Indeterminate colitis-clinically, back in remission.  Needs biologic therapy long-term as discussed today and previously.  Dependent on prednisone. Prednisone long-term not a realistic option.  Again, discussed risk and benefits of biologic therapy.  History of breast cancer in remission is not a contraindication to biologic therapy.  Recommendations  For now continue prednisone 30 mg daily but anticipate a taper regimen as soon as we can get Biologics therapy started.  For now,  continue balsalazide  Anticipate making further recommendations in the next several days  We will also obtain a hepatitis B core antibody and hepatitis B surface antibody level for baseline prior to initiating biologic therapy.  Plan for follow-up colonoscopy in 1 year.  Office follow-up appointment to be determined in the near future.      Notice: This dictation was prepared with Dragon dictation along with smaller phrase technology. Any transcriptional errors that result from this process are unintentional and may not be corrected upon review.

## 2020-10-31 NOTE — Telephone Encounter (Signed)
Spoke with pt. Pt received a call from [facility] and pt is suppose to [facility]. [facility] and is [facility] Humira. RX will be faxed to [phone]. Pt isn't sure that she wants to take the Humira due to it being an injection. If the cost is expensive, will discuss further with pt.

## 2020-10-31 NOTE — Telephone Encounter (Signed)
Pt called, she said she was seen this morning and had a discussion with Elmo Putt about taking Humira and has some questions and would like for her to call her this afternoon. [phone].

## 2020-11-02 ENCOUNTER — Other Ambulatory Visit: Payer: Self-pay

## 2020-11-02 DIAGNOSIS — Z1159 Encounter for screening for other viral diseases: Secondary | ICD-10-CM

## 2020-11-02 DIAGNOSIS — Z79899 Other long term (current) drug therapy: Secondary | ICD-10-CM

## 2020-11-02 NOTE — Telephone Encounter (Signed)
Received a call from [facility]. They requested a verbal medication RX Humira. [facility] will verify payment information and discuss with pt they they run pts insurance. Pt will start off with the started kit of Humira (Humira pen starter  3 x 80 mg/0.87m, Humira Pen Kit 2x 426m0.4 ml). The standard Humira medication is for pt to take it twice in one month and kits are dispensed with two syringes. Per Dr. RoGala Romneyhe would like for pt to take it once every 4 weeks. RX will not be able to get approved for  Syringe every 4 weeks. Instructions can be given to pt after medication is shipped to pt.   Spoke with pt. Pt is going to complete labs requested per Dr. RoGala RomneyPt states she will discuss the price when [facility] notifies pt. Pt states she still isn't sure about the biologic.

## 2020-11-02 NOTE — Telephone Encounter (Signed)
Communications noted

## 2020-11-07 ENCOUNTER — Telehealth: Payer: Self-pay | Admitting: Internal Medicine

## 2020-11-07 NOTE — Telephone Encounter (Signed)
[facility]. Please call [phone] they need an ICD code. When they called she said and spelled the patient's name as Beth Hamilton dob [birth year].

## 2020-11-11 LAB — HEPATITIS B SURFACE ANTIBODY,QUALITATIVE: Hep B Surface Ab, Qual: NONREACTIVE

## 2020-11-11 LAB — HEPATITIS B CORE ANTIBODY, TOTAL: Hep B Core Total Ab: NEGATIVE

## 2020-11-15 NOTE — Telephone Encounter (Signed)
Called [facility] to check on the status of Humira. Humira claim was denied and a PA was needed. Previous office notes were faxed. They needed additional office notes. Ov notes were faxed to [phone] per [facility].

## 2020-11-20 NOTE — Telephone Encounter (Signed)
Contacted [facility] and a new PA had to be completed. They weren't sure of pts TB test being negative. I explained that pt didn't have a skin TB test, it was lab work. Labs showed negative TB reading. Information was sent [facility] to [facility]. Waiting on an approval or denial.

## 2020-11-21 NOTE — Telephone Encounter (Signed)
FYI Received approval letter from [place] for Humira. Approval is good through 11/20/2020-11/20/2021. Approval letter will be scanned in chart.   Left a detailed message for pt. Pt can call back if needed. Pt has the [employer]. They will send someone out to instruct pt on giving injections at home. [facility] will [facility].

## 2020-11-22 NOTE — Telephone Encounter (Signed)
Communication noted.  

## 2020-11-30 ENCOUNTER — Other Ambulatory Visit: Payer: Self-pay | Admitting: Family Medicine

## 2020-12-01 ENCOUNTER — Encounter (HOSPITAL_COMMUNITY): Payer: Self-pay

## 2020-12-01 NOTE — Progress Notes (Signed)
Patient is calling as Dr. Gala Romney is wanting to start her her on Humira for her ulcerative colitis and she is wanting to know your thoughts due to the medication having several adverse side effects.  Message from Dr. Delton Coombes- I don't have any problems from her breast cancer point of view. She can safely proceed.  Patient aware that Dr. Delton Coombes does not have any problems from her breast cancer point of view. She can safely proceed with Humira.

## 2020-12-11 ENCOUNTER — Telehealth: Payer: Self-pay | Admitting: *Deleted

## 2020-12-11 NOTE — Telephone Encounter (Signed)
Pt called to inform us that she spoke to [facility].  She is going to hold off on starting Humira until after her cataract surgery.  She said that [facility] thought that it would be best that way.  She is going to see her eye doctor for consultation on Thursday of this week to schedule surgery.  Routing to Dr. Gala Romney as Juluis Rainier.

## 2020-12-12 NOTE — Telephone Encounter (Signed)
Agree with plan 

## 2020-12-13 NOTE — Telephone Encounter (Signed)
Noted  

## 2021-01-08 ENCOUNTER — Encounter: Payer: Self-pay | Admitting: Family Medicine

## 2021-01-08 ENCOUNTER — Other Ambulatory Visit: Payer: Self-pay

## 2021-01-08 ENCOUNTER — Ambulatory Visit: Payer: BC Managed Care – PPO | Admitting: Family Medicine

## 2021-01-08 VITALS — BP 148/96 | HR 96 | Temp 97.9°F | Ht 63.0 in | Wt 203.0 lb

## 2021-01-08 DIAGNOSIS — C50512 Malignant neoplasm of lower-outer quadrant of left female breast: Secondary | ICD-10-CM

## 2021-01-08 DIAGNOSIS — K5190 Ulcerative colitis, unspecified, without complications: Secondary | ICD-10-CM

## 2021-01-08 DIAGNOSIS — M810 Age-related osteoporosis without current pathological fracture: Secondary | ICD-10-CM | POA: Diagnosis not present

## 2021-01-08 DIAGNOSIS — E038 Other specified hypothyroidism: Secondary | ICD-10-CM | POA: Diagnosis not present

## 2021-01-08 DIAGNOSIS — I10 Essential (primary) hypertension: Secondary | ICD-10-CM | POA: Diagnosis not present

## 2021-01-08 DIAGNOSIS — Z170 Estrogen receptor positive status [ER+]: Secondary | ICD-10-CM

## 2021-01-08 MED ORDER — AMLODIPINE BESYLATE 5 MG PO TABS
5.0000 mg | ORAL_TABLET | Freq: Every day | ORAL | 1 refills | Status: DC
Start: 2021-01-08 — End: 2021-05-28

## 2021-01-08 MED ORDER — LISINOPRIL-HYDROCHLOROTHIAZIDE 20-25 MG PO TABS: 1.0000 | ORAL_TABLET | Freq: Every day | ORAL | 3 refills | Status: DC

## 2021-01-08 NOTE — Patient Instructions (Signed)
Send [place] BP readings in about 4 [employer].[email or web address].pdf">  DASH Eating Plan DASH stands for Dietary Approaches to Stop Hypertension. The DASH eating plan is a healthy eating plan that has been shown to: Reduce high blood pressure (hypertension). Reduce your risk for type 2 diabetes, heart disease, and stroke. Help with weight loss. What are tips for following this plan? Reading food labels Check food labels for the amount of salt (sodium) per serving. Choose foods with less than 5 percent of the Daily Value of sodium. Generally, foods with less than 300 milligrams (mg) of sodium per serving fit into this eating plan. To find whole grains, look for the word "whole" as the first word in the ingredient list. Shopping Buy products labeled as "low-sodium" or "no salt added." Buy fresh foods. Avoid canned foods and pre-made or frozen meals. Cooking Avoid adding salt when cooking. Use salt-free seasonings or herbs instead of table salt or sea salt. Check with your health care provider or pharmacist before using salt substitutes. Do not fry foods. Cook foods using healthy methods such as baking, boiling, grilling, roasting, and broiling instead. Cook with heart-healthy oils, such as olive, canola, avocado, soybean, or sunflower oil. Meal planning  Eat a balanced diet that includes: 4 or more servings of fruits and 4 or more servings of vegetables each day. Try to fill one-half of your plate with fruits and vegetables. 6-8 servings of whole grains each day. Less than 6 oz (170 g) of lean meat, poultry, or fish each day. A 3-oz (85-g) serving of meat is about the same size as a deck of cards. One egg equals 1 oz (28 g). 2-3 servings of low-fat dairy each day. One serving is 1 cup (237 mL). 1 serving of nuts, seeds, or beans 5 times each week. 2-3 servings of heart-healthy fats. Healthy fats called omega-3 fatty acids are found in foods such as walnuts,  flaxseeds, fortified milks, and eggs. These fats are also found in cold-water fish, such as sardines, salmon, and mackerel. Limit how much you eat of: Canned or prepackaged foods. Food that is high in trans fat, such as some fried foods. Food that is high in saturated fat, such as fatty meat. Desserts and other sweets, sugary drinks, and other foods with added sugar. Full-fat dairy products. Do not salt foods before eating. Do not eat more than 4 egg yolks a week. Try to eat at least 2 vegetarian meals a week. Eat more home-cooked food and less restaurant, buffet, and fast food.  Lifestyle When eating at a restaurant, ask that your food be prepared with less salt or no salt, if possible. If you drink alcohol: Limit how much you use to: 0-1 drink a day for women who are not pregnant. 0-2 drinks a day for men. Be aware of how much alcohol is in your drink. In the [place] one drink equals one 12 oz bottle of beer (355 mL), one 5 oz glass of wine (148 mL), or one 1 oz glass of hard liquor (44 mL). General information Avoid eating more than 2,300 mg of salt a day. If you have hypertension, you may need to reduce your sodium intake to 1,500 mg a day. Work with your health care provider to maintain a healthy body weight or to lose weight. Ask what an ideal weight is for you. Get at least 30 minutes of exercise that causes your heart to beat faster (aerobic exercise) most days of the week. Activities may include walking, swimming, or  biking. Work with your health care provider or dietitian to adjust your eating plan to your individual calorie needs. What foods should I eat? Fruits All fresh, dried, or frozen fruit. Canned fruit in natural juice (without addedsugar). Vegetables Fresh or frozen vegetables (raw, steamed, roasted, or grilled). Low-sodium or reduced-sodium tomato and vegetable juice. Low-sodium or reduced-sodium tomatosauce and tomato paste. Low-sodium or reduced-sodium canned  vegetables. Grains Whole-grain or whole-wheat bread. Whole-grain or whole-wheat pasta. Brown rice. [place]row. Bulgur. Whole-grain and low-sodium cereals. Pita bread.Low-fat, low-sodium crackers. Whole-wheat flour tortillas. Meats and other proteins Skinless chicken or [place]. Ground chicken or [place]. Pork with fat trimmed off. Fish and seafood. Egg whites. Dried beans, peas, or lentils. Unsalted nuts, nut butters, and seeds. Unsalted canned beans. Lean cuts of beef with fat trimmed off. Low-sodium, lean precooked or cured meat, such as sausages or meatloaves. Dairy Low-fat (1%) or fat-free (skim) milk. Reduced-fat, low-fat, or fat-free cheeses. Nonfat, low-sodium ricotta or cottage cheese. Low-fat or nonfatyogurt. Low-fat, low-sodium cheese. Fats and oils Soft margarine without trans fats. Vegetable oil. Reduced-fat, low-fat, or light mayonnaise and salad dressings (reduced-sodium). Canola, safflower, olive, avocado, soybean, andsunflower oils. Avocado. Seasonings and condiments Herbs. Spices. Seasoning mixes without salt. Other foods Unsalted popcorn and pretzels. Fat-free sweets. The items listed above may not be a complete list of foods and beverages you can eat. Contact a dietitian for more information. What foods should I avoid? Fruits Canned fruit in a light or heavy syrup. Fried fruit. Fruit in cream or buttersauce. Vegetables Creamed or fried vegetables. Vegetables in a cheese sauce. Regular canned vegetables (not low-sodium or reduced-sodium). Regular canned tomato sauce and paste (not low-sodium or reduced-sodium). Regular tomato and vegetable juice(not low-sodium or reduced-sodium). Angie Fava. Olives. Grains Baked goods made with fat, such as croissants, muffins, or some breads. Drypasta or rice meal packs. Meats and other proteins Fatty cuts of meat. Ribs. Fried meat. Berniece Salines. Bologna, salami, and other precooked or cured meats, such as sausages or meat loaves. Fat from the back  of a pig (fatback). Bratwurst. Salted nuts and seeds. Canned beans with added salt. Canned orsmoked fish. Whole eggs or egg yolks. Chicken or [place] with skin. Dairy Whole or 2% milk, cream, and half-and-half. Whole or full-fat cream cheese. Whole-fat or sweetened yogurt. Full-fat cheese. Nondairy creamers. Whippedtoppings. Processed cheese and cheese spreads. Fats and oils Butter. Stick margarine. Lard. Shortening. Ghee. Bacon fat. Tropical oils, suchas coconut, palm kernel, or palm oil. Seasonings and condiments Onion salt, garlic salt, seasoned salt, table salt, and sea salt. Worcestershire sauce. Tartar sauce. Barbecue sauce. Teriyaki sauce. Soy sauce, including reduced-sodium. Steak sauce. Canned and packaged gravies. Fish sauce. Oyster sauce. Cocktail sauce. Store-bought horseradish. Ketchup. Mustard. Meat flavorings and tenderizers. Bouillon cubes. Hot sauces. Pre-made or packaged marinades. Pre-made or packaged taco seasonings. Relishes. Regular saladdressings. Other foods Salted popcorn and pretzels. The items listed above may not be a complete list of foods and beverages you should avoid. Contact a dietitian for more information. Where to find more information National Heart, Lung, and Blood Institute: https://wilson-eaton.com/ American Heart Association: www.heart.org Academy of Nutrition and Dietetics: www.eatright.[place]: www.kidney.org Summary The DASH eating plan is a healthy eating plan that has been shown to reduce high blood pressure (hypertension). It may also reduce your risk for type 2 diabetes, heart disease, and stroke. When on the DASH eating plan, aim to eat more fresh fruits and vegetables, whole grains, lean proteins, low-fat dairy, and heart-healthy fats. With the DASH eating plan, you  should limit salt (sodium) intake to 2,300 mg a day. If you have hypertension, you may need to reduce your sodium intake to 1,500 mg a day. Work with your health care  provider or dietitian to adjust your eating plan to your individual calorie needs. This information is not intended to replace advice given to you by your health care provider. Make sure you discuss any questions you have with your healthcare provider. Document Revised: 06/11/2019 Document Reviewed: 06/11/2019 Elsevier Patient Education  2022 [employer].

## 2021-01-08 NOTE — Progress Notes (Signed)
   Subjective:    Patient ID: Beth Hamilton, female    DOB: [birth year], 65 y.o.   MRN: [id number]  Hypertension This is a chronic problem. Compliance problems: takes meds every day, does a little exercise, eats healthy.    HTN (hypertension), benign  Ulcerative colitis without complications, unspecified location [facility])  Other specified hypothyroidism  Age related osteoporosis, unspecified pathological fracture presence She relates having difficult time with high price of Prolia She is looking at going on Humira in the near future in place of prednisone which I told her I believe would be a good move Her breast cancer is being treated by specialist Review of Systems     Objective:   Physical Exam  General-in no acute distress Eyes-no discharge Lungs-respiratory rate normal, CTA CV-no murmurs,RRR Extremities skin warm dry no edema Neuro grossly normal Behavior normal, alert       Assessment & Plan:  1. HTN (hypertension), benign Blood pressure is mildly elevated compared to where it needs to be.  Increase amlodipine new dose 5 mg.  Patient wishes to combine lisinopril and HCTZ together.  We will do so.  Patient will check blood pressures on a regular basis over the next month and send [place] readings.  She was offered a follow-up visit in 1 month she defers.  Follow-up in 6 months.  2. Ulcerative colitis without complications, unspecified location [facility]) She is being transitioned over to Humira by her specialist  3. Other specified hypothyroidism Under the care of of Dr. Ronnald Collum for thyroid  4. Age related osteoporosis, unspecified pathological fracture presence Under the care of oncology for this.  Patient states Prolia shots are very expensive she is looking to transition over to Fosamax if possible-she will discuss this with Dr. Delton Coombes  Follow-up 6 months

## 2021-01-10 ENCOUNTER — Other Ambulatory Visit (HOSPITAL_COMMUNITY): Payer: Self-pay

## 2021-01-10 DIAGNOSIS — C50512 Malignant neoplasm of lower-outer quadrant of left female breast: Secondary | ICD-10-CM

## 2021-01-11 ENCOUNTER — Inpatient Hospital Stay (HOSPITAL_COMMUNITY): Payer: BC Managed Care – PPO | Attending: Hematology

## 2021-01-11 ENCOUNTER — Other Ambulatory Visit: Payer: Self-pay

## 2021-01-11 ENCOUNTER — Inpatient Hospital Stay (HOSPITAL_COMMUNITY): Payer: BC Managed Care – PPO

## 2021-01-11 VITALS — BP 167/84 | HR 96 | Temp 98.4°F | Resp 18

## 2021-01-11 DIAGNOSIS — C50512 Malignant neoplasm of lower-outer quadrant of left female breast: Secondary | ICD-10-CM | POA: Insufficient documentation

## 2021-01-11 DIAGNOSIS — M810 Age-related osteoporosis without current pathological fracture: Secondary | ICD-10-CM

## 2021-01-11 LAB — CBC WITH DIFFERENTIAL/PLATELET
Abs Immature Granulocytes: 0.18 10*3/uL — ABNORMAL HIGH (ref 0.00–0.07)
Basophils Absolute: 0.1 10*3/uL (ref 0.0–0.1)
Basophils Relative: 0 %
Eosinophils Absolute: 0 10*3/uL (ref 0.0–0.5)
Eosinophils Relative: 0 %
HCT: 38.8 % (ref 36.0–46.0)
Hemoglobin: 12.7 g/dL (ref 12.0–15.0)
Immature Granulocytes: 1 %
Lymphocytes Relative: 9 %
Lymphs Abs: 1.2 10*3/uL (ref 0.7–4.0)
MCH: 36.9 pg — ABNORMAL HIGH (ref 26.0–34.0)
MCHC: 32.7 g/dL (ref 30.0–36.0)
MCV: 112.8 fL — ABNORMAL HIGH (ref 80.0–100.0)
Monocytes Absolute: 0.6 10*3/uL (ref 0.1–1.0)
Monocytes Relative: 4 %
Neutro Abs: 11.9 10*3/uL — ABNORMAL HIGH (ref 1.7–7.7)
Neutrophils Relative %: 86 %
Platelets: 255 10*3/uL (ref 150–400)
RBC: 3.44 MIL/uL — ABNORMAL LOW (ref 3.87–5.11)
RDW: 13.6 % (ref 11.5–15.5)
WBC: 13.9 10*3/uL — ABNORMAL HIGH (ref 4.0–10.5)
nRBC: 0 % (ref 0.0–0.2)

## 2021-01-11 LAB — COMPREHENSIVE METABOLIC PANEL
ALT: 22 U/L (ref 0–44)
AST: 23 U/L (ref 15–41)
Albumin: 3.8 g/dL (ref 3.5–5.0)
Alkaline Phosphatase: 34 U/L — ABNORMAL LOW (ref 38–126)
Anion gap: 9 (ref 5–15)
BUN: 18 mg/dL (ref 8–23)
CO2: 33 mmol/L — ABNORMAL HIGH (ref 22–32)
Calcium: 10 mg/dL (ref 8.9–10.3)
Chloride: 96 mmol/L — ABNORMAL LOW (ref 98–111)
Creatinine, Ser: 0.77 mg/dL (ref 0.44–1.00)
GFR, Estimated: >60 mL/min (ref >60)
Glucose, Bld: 209 mg/dL — ABNORMAL HIGH (ref 70–99)
Potassium: 4.1 mmol/L (ref 3.5–5.1)
Sodium: 138 mmol/L (ref 135–145)
Total Bilirubin: 1.2 mg/dL (ref 0.3–1.2)
Total Protein: 6.2 g/dL — ABNORMAL LOW (ref 6.5–8.1)

## 2021-01-11 MED ORDER — DENOSUMAB 60 MG/ML ~~LOC~~ SOSY
60.0000 mg | PREFILLED_SYRINGE | Freq: Once | SUBCUTANEOUS | Status: DC
  Filled 2021-01-11: qty 1

## 2021-01-11 NOTE — Progress Notes (Signed)
Beth Hamilton presents today for injection per the provider's orders.  Denosumab 4m administration without incident; injection site WNL; see MAR for injection details.  Patient tolerated procedure well and without incident.  No questions or complaints noted at this time. Patient discharged stable and ambulatory.

## 2021-01-11 NOTE — Patient Instructions (Signed)
[facility]  Discharge Instructions: Thank you for choosing [facility] to provide your oncology and hematology care.  If you have a lab appointment with the Cancer Center, please come in thru the Main Entrance and check in at the main information desk.  Wear comfortable clothing and clothing appropriate for easy access to any Portacath or PICC line.   We strive to give you quality time with your provider. You may need to reschedule your appointment if you arrive late (15 or more minutes).  Arriving late affects you and other patients whose appointments are after yours.  Also, if you miss three or more appointments without notifying the office, you may be dismissed from the clinic at the provider's discretion.      For prescription refill requests, have your pharmacy contact our office and allow 72 hours for refills to be completed.        To help prevent nausea and vomiting after your treatment, we encourage you to take your nausea medication as directed.  BELOW ARE SYMPTOMS THAT SHOULD BE REPORTED IMMEDIATELY: *FEVER GREATER THAN 100.4 F (38 C) OR HIGHER *CHILLS OR SWEATING *NAUSEA AND VOMITING THAT IS NOT CONTROLLED WITH YOUR NAUSEA MEDICATION *UNUSUAL SHORTNESS OF BREATH *UNUSUAL BRUISING OR BLEEDING *URINARY PROBLEMS (pain or burning when urinating, or frequent urination) *BOWEL PROBLEMS (unusual diarrhea, constipation, pain near the anus) TENDERNESS IN MOUTH AND THROAT WITH OR WITHOUT PRESENCE OF ULCERS (sore throat, sores in mouth, or a toothache) UNUSUAL RASH, SWELLING OR PAIN  UNUSUAL VAGINAL DISCHARGE OR ITCHING   Items with * indicate a potential emergency and should be followed up as soon as possible or go to the Emergency Department if any problems should occur.  Please show the CHEMOTHERAPY ALERT CARD or IMMUNOTHERAPY ALERT CARD at check-in to the Emergency Department and triage nurse.  Should you have questions after your visit or need to cancel  or reschedule your appointment, please contact [facility] [phone]  and follow the prompts.  Office hours are 8:00 a.m. to 4:30 p.m. Monday - Friday. Please note that voicemails left after 4:00 p.m. may not be returned until the following business day.  We are closed weekends and major holidays. You have access to a nurse at all times for urgent questions. Please call the main number to the clinic [phone] and follow the prompts.  For any non-urgent questions, you may also contact your provider using MyChart. We now offer e-Visits for anyone 18 and older to request care online for non-urgent symptoms. For details visit [email or web address].   Also download the MyChart app! Go to the app store, search "MyChart", open the app, select [facility], and log in with your MyChart username and password.  Due to Covid, a mask is required upon entering the hospital/clinic. If you do not have a mask, one will be given to you upon arrival. For doctor visits, patients may have 1 support person aged 18 or older with them. For treatment visits, patients cannot have anyone with them due to current Covid guidelines and our immunocompromised population.  

## 2021-01-12 ENCOUNTER — Other Ambulatory Visit: Payer: Self-pay | Admitting: Family Medicine

## 2021-01-12 DIAGNOSIS — M10072 Idiopathic gout, left ankle and foot: Secondary | ICD-10-CM

## 2021-01-12 MED ORDER — ALLOPURINOL 300 MG PO TABS: ORAL_TABLET | ORAL | 0 refills | Status: DC

## 2021-01-12 NOTE — Telephone Encounter (Signed)
Patient is requesting refill allopurinol 300 mg for her gout. [facility]

## 2021-01-15 NOTE — Telephone Encounter (Signed)
Patient notified

## 2021-01-17 ENCOUNTER — Telehealth: Payer: Self-pay | Admitting: Gastroenterology

## 2021-01-17 ENCOUNTER — Other Ambulatory Visit: Payer: Self-pay | Admitting: Internal Medicine

## 2021-01-17 NOTE — Telephone Encounter (Signed)
Received a refill request for prednisone. Please find out if patient has started Humira. She said she had planned to wait until after cataract surgery. We are waiting for Humira to be started before tapering prednisone.

## 2021-01-18 NOTE — Telephone Encounter (Addendum)
Spoke to pt. She informed me that her second cataract surgery is 01/19/2021. Pt requested a refill on prednisone 12m and stated she has not started Humira.  Spoke to pt. This afternoon to inform her that her prescription was sent to the pharmacy. Pt informed me that she will be on eye drops for 4 weeks.Wanted to know if it was ok to be on eye drops and Humira at the same time or should she wait.

## 2021-01-18 NOTE — Telephone Encounter (Signed)
Refill provided. Needs to let us know with Humira started, suggest starting as soon as she can post-operatively.

## 2021-01-18 NOTE — Telephone Encounter (Signed)
Lmom to call office.

## 2021-01-23 NOTE — Telephone Encounter (Signed)
Pt. Called in wanted to know if she could take eyedrops and Humira together. Pt has not started Humira yet.  Pt.stated she will be on eyedrops for 4 weeks.    Routing to Dr. Gala Romney in absent of [place].

## 2021-01-23 NOTE — Telephone Encounter (Signed)
LMOM for pt. To call back

## 2021-01-24 NOTE — Telephone Encounter (Signed)
Defer to Dr. Gala Romney as he saw patient last.

## 2021-01-26 NOTE — Telephone Encounter (Signed)
Spoke to nurse at Ophthalmologist office, she stated that the pt. Is on eyedrops (Tobramycin .03%, Prolensa, and Difluprednate .05%) and that this should not interfere with the Humira. I would like to see how you want [place] to proceed with this matter.

## 2021-01-31 ENCOUNTER — Telehealth: Payer: Self-pay | Admitting: Internal Medicine

## 2021-01-31 NOTE — Telephone Encounter (Signed)
Pt was returning a call from [place]. Please call [phone]

## 2021-01-31 NOTE — Telephone Encounter (Signed)
LMOM for pt. To call office back.

## 2021-01-31 NOTE — Telephone Encounter (Signed)
Spoke to [place] Edwena Felty) [phone].  She informed me that Humira starter kit did not look like it had been approved.  Gave me the number to [place] dept [phone].  Spoke to [place] and she informed me that starter and maintenance had been approved 11/20/2020-11/20/2021.  It confirmed what we had on file as well.

## 2021-02-05 ENCOUNTER — Telehealth: Payer: Self-pay | Admitting: Internal Medicine

## 2021-02-05 NOTE — Telephone Encounter (Signed)
Spoke to [place] from [employer] prior authorization dept [id number]). She informed me that the prescription had expired. That is why pt. Was having a hard time getting Starter Pack for Humira 33m. Pharmacist said they have released medication.

## 2021-02-05 NOTE — Telephone Encounter (Signed)
Spoke to pt. Informed her that I faxed [facility] [phone])

## 2021-02-05 NOTE — Telephone Encounter (Signed)
Pt is needing to speak with nurse. [phone]

## 2021-02-05 NOTE — Telephone Encounter (Signed)
Spoke to [employer] at [place].  She informed me that she has not received the PA approval information from PA department.  Faxed our approval letter to [phone].

## 2021-02-06 NOTE — Telephone Encounter (Signed)
Spoke to pt. She informed me that [facility] and [facility] will be [facility] Humira next week. She informed me that she read about the side effects and stated if it starts to affect her immune system she will stop taking it. She also wanted to know if she is to continue taking Balsalazide 259m TID and also the Prednisone 138mTID.

## 2021-02-06 NOTE — Telephone Encounter (Signed)
Spoke to pt. Informed her of recommendation. She informed me that she will call office when she does her first injection of Humira.

## 2021-02-16 ENCOUNTER — Other Ambulatory Visit: Payer: Self-pay | Admitting: Family Medicine

## 2021-02-16 ENCOUNTER — Other Ambulatory Visit: Payer: Self-pay | Admitting: Gastroenterology

## 2021-02-16 DIAGNOSIS — K529 Noninfective gastroenteritis and colitis, unspecified: Secondary | ICD-10-CM

## 2021-02-21 ENCOUNTER — Telehealth: Payer: Self-pay | Admitting: Internal Medicine

## 2021-02-21 NOTE — Telephone Encounter (Signed)
Pt asking for Angie. Please call [phone]

## 2021-02-21 NOTE — Telephone Encounter (Signed)
Pt called and wanted to see if we had faxed information to nurse ambassador.  Informed her that we had faxed the information that was requested yesterday.  She was aware that it required hand written signature and that Dr. Gala Romney just returned from vacation yesterday.

## 2021-02-22 NOTE — Telephone Encounter (Signed)
Spoke to pt.  She informed me that Marcie Bal (nurse) came out today and gave her 160 mg Humira (starter).  She said the second injection would be in 2 weeks 03/08/2021.  She wanted me to route Dr. Gala Romney a message to see when he would like Prednisone taper.

## 2021-02-22 NOTE — Telephone Encounter (Signed)
Noted.  Will route to schedulers to arrange ov with APP.  Pt needs ov 2 to 4 weeks after 03/08/2021 (second dose).

## 2021-03-05 ENCOUNTER — Other Ambulatory Visit: Payer: Self-pay | Admitting: Family Medicine

## 2021-03-08 ENCOUNTER — Other Ambulatory Visit (HOSPITAL_COMMUNITY): Payer: Self-pay | Admitting: Hematology

## 2021-03-08 DIAGNOSIS — C50512 Malignant neoplasm of lower-outer quadrant of left female breast: Secondary | ICD-10-CM

## 2021-03-16 ENCOUNTER — Telehealth: Payer: Self-pay | Admitting: Family Medicine

## 2021-03-16 NOTE — Telephone Encounter (Signed)
Patient dropped off blood pressure readings for you to review in your folder

## 2021-03-19 NOTE — Telephone Encounter (Signed)
Blood pressure readings Thursday, August 25 152/87 Thursday, August 11 147/92 Wednesday, July 27 145/94 Monday, August 1 151/84 these are just a sample of some of the readings sent to [place]

## 2021-03-19 NOTE — Telephone Encounter (Signed)
Patient has been made aware of drs recommendations for follow up in September for blood pressure.

## 2021-03-19 NOTE — Telephone Encounter (Signed)
Blood pressure readings were reviewed several of these are above the goal. I assume these were done on her own blood pressure cuff at home? Healthy diet regular activity recommended I would recommend a follow-up office visit in September Bring automated cuff with her to compare to ours

## 2021-03-20 ENCOUNTER — Telehealth: Payer: Self-pay | Admitting: Internal Medicine

## 2021-03-20 NOTE — Telephone Encounter (Signed)
Pt called stating that she will be taking her 4th Humira shot on Thursday. Pt is wanting to know when she can come off of the prednisone. Pt is also still taking the balsalazide three times daily. Please advise.

## 2021-03-20 NOTE — Telephone Encounter (Signed)
[phone]  PATIENT NEEDS TO BE CALLED ABOUT STOPPING HER PREDNOSONE.

## 2021-03-20 NOTE — Telephone Encounter (Signed)
See other note

## 2021-03-21 NOTE — Telephone Encounter (Signed)
Pt was made aware and verbalized understanding.

## 2021-03-22 ENCOUNTER — Other Ambulatory Visit: Payer: Self-pay | Admitting: Family Medicine

## 2021-04-09 ENCOUNTER — Other Ambulatory Visit: Payer: Self-pay

## 2021-04-09 ENCOUNTER — Encounter: Payer: Self-pay | Admitting: Family Medicine

## 2021-04-09 ENCOUNTER — Ambulatory Visit: Payer: BC Managed Care – PPO | Admitting: Family Medicine

## 2021-04-09 VITALS — BP 130/86 | HR 95 | Temp 97.2°F | Wt 208.8 lb

## 2021-04-09 DIAGNOSIS — R5383 Other fatigue: Secondary | ICD-10-CM

## 2021-04-09 DIAGNOSIS — I10 Essential (primary) hypertension: Secondary | ICD-10-CM | POA: Diagnosis not present

## 2021-04-09 DIAGNOSIS — Z79899 Other long term (current) drug therapy: Secondary | ICD-10-CM | POA: Diagnosis not present

## 2021-04-09 DIAGNOSIS — E7849 Other hyperlipidemia: Secondary | ICD-10-CM | POA: Diagnosis not present

## 2021-04-09 NOTE — Progress Notes (Signed)
   Subjective:    Patient ID: Beth Hamilton, female    DOB: Dec 01, 1955, 65 y.o.   MRN: [id number]  HPI Pt here for HTN follow up. Pt states no issues with blood pressure. Not checking regularly at home; did bring BP cuff.   Patient for blood pressure check up.  The patient does have hypertension.    Medication compliance-relates compliance  Blood pressure control recently-states blood pressure been elevated on her blood pressure cuff  Dietary compliance-tries to eat healthy.  We checked her blood pressure with her cuff, her blood pressure machine has a small cuff which can overestimate her blood pressure. Review of Systems     Objective:   Physical Exam  General-in no acute distress Eyes-no discharge Lungs-respiratory rate normal, CTA CV-no murmurs,RRR Extremities skin warm dry no edema Neuro grossly normal Behavior normal, alert       Assessment & Plan:  Blood pressure when checked on large cuff was normal.  Continue current medication healthy diet regular activity follow-up by spring time Importance of healthy diet regular activity recommended  Patient is currently tapering off of her prednisone  She could do a shingles vaccine later this year after she is off of her prednisone

## 2021-04-09 NOTE — Patient Instructions (Signed)
Shingrix and shingles prevention: know the facts!   Shingrix is a very effective vaccine to prevent shingles.   Shingles is a reactivation of chickenpox -more than 99% of Americans born before 1980 have had chickenpox even if they do not remember it. One in every 10 people who get shingles have severe long-lasting nerve pain as a result.   33 out of a 100 older adults will get shingles if they are unvaccinated.     This vaccine is very important for your health This vaccine is indicated for anyone 50 years or older. You can get this vaccine even if you have already had shingles because you can get the disease more than once in a lifetime.  Your risk for shingles and its complications increases with age.  This vaccine has 2 doses.  The second dose would be 2 to 6 months after the first dose.  If you had Zostavax vaccine in the past you should still get Shingrix. ( Zostavax is only 70% effective and it loses significant strength over a few years .)  This vaccine is given through the pharmacy.  The cost of the vaccine is through your insurance. The pharmacy can inform you of the total costs.  Common side effects including soreness in the arm, some redness and swelling, also some feel fatigue muscle soreness headache low-grade fever.  Side effects typically go away within 2 to 3 days. Remember-the pain from shingles can last a lifetime but these side effects of the vaccine will only last a few days at most. It is very important to get both doses in order to protect yourself fully.   Please get this vaccine at your earliest convenience at your trusted pharmacy.     Wait till you are off of prednisone  Also get the new dose of covid vaccine

## 2021-04-26 ENCOUNTER — Encounter: Payer: Self-pay | Admitting: Internal Medicine

## 2021-05-03 ENCOUNTER — Other Ambulatory Visit: Payer: Self-pay | Admitting: Family Medicine

## 2021-05-03 ENCOUNTER — Other Ambulatory Visit (HOSPITAL_COMMUNITY): Payer: BC Managed Care – PPO

## 2021-05-07 ENCOUNTER — Other Ambulatory Visit: Payer: Self-pay

## 2021-05-07 ENCOUNTER — Inpatient Hospital Stay (HOSPITAL_COMMUNITY): Payer: BC Managed Care – PPO | Attending: Hematology

## 2021-05-07 ENCOUNTER — Ambulatory Visit (HOSPITAL_COMMUNITY)
Admission: RE | Admit: 2021-05-07 | Discharge: 2021-05-07 | Disposition: A | Discharge status: Home / Self Care | Payer: BC Managed Care – PPO | Source: Ambulatory Visit | Attending: Hematology | Admitting: Hematology

## 2021-05-07 DIAGNOSIS — C50512 Malignant neoplasm of lower-outer quadrant of left female breast: Secondary | ICD-10-CM | POA: Insufficient documentation

## 2021-05-07 DIAGNOSIS — M810 Age-related osteoporosis without current pathological fracture: Secondary | ICD-10-CM | POA: Diagnosis not present

## 2021-05-07 DIAGNOSIS — Z1231 Encounter for screening mammogram for malignant neoplasm of breast: Secondary | ICD-10-CM | POA: Diagnosis not present

## 2021-05-07 DIAGNOSIS — Z170 Estrogen receptor positive status [ER+]: Secondary | ICD-10-CM | POA: Diagnosis present

## 2021-05-07 DIAGNOSIS — Z79811 Long term (current) use of aromatase inhibitors: Secondary | ICD-10-CM | POA: Diagnosis not present

## 2021-05-07 DIAGNOSIS — Z923 Personal history of irradiation: Secondary | ICD-10-CM | POA: Diagnosis not present

## 2021-05-07 LAB — COMPREHENSIVE METABOLIC PANEL
ALT: 20 U/L (ref 0–44)
AST: 32 U/L (ref 15–41)
Albumin: 3.8 g/dL (ref 3.5–5.0)
Alkaline Phosphatase: 37 U/L — ABNORMAL LOW (ref 38–126)
Anion gap: 7 (ref 5–15)
BUN: 10 mg/dL (ref 8–23)
CO2: 30 mmol/L (ref 22–32)
Calcium: 8.8 mg/dL — ABNORMAL LOW (ref 8.9–10.3)
Chloride: 99 mmol/L (ref 98–111)
Creatinine, Ser: 0.75 mg/dL (ref 0.44–1.00)
GFR, Estimated: >60 mL/min (ref >60)
Glucose, Bld: 195 mg/dL — ABNORMAL HIGH (ref 70–99)
Potassium: 3.7 mmol/L (ref 3.5–5.1)
Sodium: 136 mmol/L (ref 135–145)
Total Bilirubin: 0.6 mg/dL (ref 0.3–1.2)
Total Protein: 6.7 g/dL (ref 6.5–8.1)

## 2021-05-07 LAB — CBC WITH DIFFERENTIAL/PLATELET
Abs Immature Granulocytes: 0.07 10*3/uL (ref 0.00–0.07)
Basophils Absolute: 0 10*3/uL (ref 0.0–0.1)
Basophils Relative: 1 %
Eosinophils Absolute: 0.1 10*3/uL (ref 0.0–0.5)
Eosinophils Relative: 1 %
HCT: 42.4 % (ref 36.0–46.0)
Hemoglobin: 13.4 g/dL (ref 12.0–15.0)
Immature Granulocytes: 1 %
Lymphocytes Relative: 31 %
Lymphs Abs: 2.5 10*3/uL (ref 0.7–4.0)
MCH: 33.8 pg (ref 26.0–34.0)
MCHC: 31.6 g/dL (ref 30.0–36.0)
MCV: 107.1 fL — ABNORMAL HIGH (ref 80.0–100.0)
Monocytes Absolute: 0.8 10*3/uL (ref 0.1–1.0)
Monocytes Relative: 10 %
Neutro Abs: 4.5 10*3/uL (ref 1.7–7.7)
Neutrophils Relative %: 56 %
Platelets: 301 10*3/uL (ref 150–400)
RBC: 3.96 MIL/uL (ref 3.87–5.11)
RDW: 12.4 % (ref 11.5–15.5)
WBC: 8.1 10*3/uL (ref 4.0–10.5)
nRBC: 0 % (ref 0.0–0.2)

## 2021-05-07 LAB — VITAMIN D 25 HYDROXY (VIT D DEFICIENCY, FRACTURES): Vit D, 25-Hydroxy: 39.45 ng/mL (ref 30–100)

## 2021-05-07 LAB — TSH: TSH: 2.688 u[IU]/mL (ref 0.350–4.500)

## 2021-05-09 NOTE — Progress Notes (Signed)
[facility] [address], [place] [address]   Patient Care Team: [name], MD as PCP - General (Family Medicine) Gala Romney, Cristopher Estimable, MD (Gastroenterology) Aviva Signs, MD as Consulting Physician (General Surgery) Florian Buff, MD as Consulting Physician (Obstetrics and Gynecology)  SUMMARY OF ONCOLOGIC HISTORY: Oncology History  Breast cancer of lower-outer quadrant of left female breast ([place])  05/24/2014 Initial Diagnosis   Breast cancer of lower-outer quadrant of left female breast   06/22/2014 - 08/05/2014 Radiation Therapy   Left breast 50 Gy at 2 Gy per fraction x 25 fractions with left breast boost 10 Gy at 2 Gy per fraction x 5 fractions by Dr. Pablo Ledger.     CHIEF COMPLIANT: Follow-up for left breast cancer   INTERVAL HISTORY: Beth Hamilton is a 65 y.o. female here today for follow up of her left breast cancer. Her last visit was on 05/03/2020.   Today she reports feeling good. She denies hot flashes and joint pains.   REVIEW OF SYSTEMS:   Review of Systems  Constitutional:  Negative for appetite change and fatigue (75%).  Endocrine: Negative for hot flashes.  Musculoskeletal:  Negative for arthralgias.  All other systems reviewed and are negative.  I have reviewed the past medical history, past surgical history, social history and family history with the patient and they are unchanged from previous note.   ALLERGIES:   is allergic to flagyl [metronidazole], other, and levaquin [levofloxacin in d5w].   MEDICATIONS:  Current Outpatient Medications  Medication Sig Dispense Refill   allopurinol (ZYLOPRIM) 300 MG tablet TAKE (1) TABLET BY MOUTH ONCE DAILY. 90 tablet 0   amLODipine (NORVASC) 5 MG tablet Take 1 tablet (5 mg total) by mouth daily. 90 tablet 1   anastrozole (ARIMIDEX) 1 MG tablet TAKE 1 TABLET BY MOUTH EVERY DAY 30 tablet 5   ARMOUR THYROID 60 MG tablet Take 60 mg by mouth daily.      B Complex Vitamins (VITAMIN-B  COMPLEX PO) Take 1 mL by mouth daily. Vit b12     balsalazide (COLAZAL) 750 MG capsule TAKE 3 CAPSULES (2,250 MG TOTAL) BY MOUTH 3 (THREE) TIMES DAILY. 270 capsule 5   Calcium Citrate-Vitamin D (CITRACAL + D PO) Take by mouth.     fluticasone (CUTIVATE) 0.05 % cream APPLY TO AFFECTED AREAS TWICE DAILY. (Patient taking differently: APPLY TO AFFECTED AREAS TWICE DAILY PRN) 60 g 1   folic acid (FOLVITE) 173 MCG tablet Take 400 mcg by mouth daily.      HUMIRA PEN 40 MG/0.4ML PNKT SMARTSIG:40 Milligram(s) SUB-Q Every 2 Weeks     lisinopril-hydrochlorothiazide (ZESTORETIC) 20-25 MG tablet Take 1 tablet by mouth daily. 90 tablet 3   predniSONE (DELTASONE) 10 MG tablet TAKE 3 TABLETS (30 MG TOTAL) BY MOUTH DAILY WITH BREAKFAST. 90 tablet 3   No current facility-administered medications for this visit.     PHYSICAL EXAMINATION: Performance status (ECOG): 0 - Asymptomatic  There were no vitals filed for this visit. Wt Readings from Last 3 Encounters:  04/09/21 208 lb 12.8 oz (94.7 kg)  01/08/21 203 lb (92.1 kg)  10/31/20 202 lb (91.6 kg)   Physical Exam Vitals reviewed.  Constitutional:      Appearance: Normal appearance.  Cardiovascular:     Rate and Rhythm: Normal rate and regular rhythm.     Pulses: Normal pulses.     Heart sounds: Normal heart sounds.  Pulmonary:     Effort: Pulmonary effort is  normal.     Breath sounds: Normal breath sounds.  Chest:  Breasts:    Right: Normal. No inverted nipple, mass, nipple discharge, skin change or tenderness.     Left: Normal. No inverted nipple, mass, nipple discharge, skin change (medial quadrant lumpectomy scar within normal limits) or tenderness.  Abdominal:     Palpations: Abdomen is soft. There is no hepatomegaly, splenomegaly or mass.     Tenderness: There is no abdominal tenderness.  Lymphadenopathy:     Upper Body:     Right upper body: No supraclavicular, axillary or pectoral adenopathy.     Left upper body: No supraclavicular,  axillary or pectoral adenopathy.  Neurological:     General: No focal deficit present.     Mental Status: She is alert and oriented to person, place, and time.  Psychiatric:        Mood and Affect: Mood normal.        Behavior: Behavior normal.    Breast Exam Chaperone: Thana Ates     LABORATORY DATA:  I have reviewed the data as listed CMP Latest Ref Rng & Units 05/07/2021 01/11/2021 07/19/2020  Glucose 70 - 99 mg/dL 195(H) 209(H) 119(H)  BUN 8 - 23 mg/dL 10 18 16   Creatinine 0.44 - 1.00 mg/dL 0.75 0.77 0.87  Sodium 135 - 145 mmol/L 136 138 142  Potassium 3.5 - 5.1 mmol/L 3.7 4.1 4.5  Chloride 98 - 111 mmol/L 99 96(L) 100  CO2 22 - 32 mmol/L 30 33(H) 27  Calcium 8.9 - 10.3 mg/dL 8.8(L) 10.0 9.0  Total Protein 6.5 - 8.1 g/dL 6.7 6.2(L) -  Total Bilirubin 0.3 - 1.2 mg/dL 0.6 1.2 -  Alkaline Phos 38 - 126 U/L 37(L) 34(L) -  AST 15 - 41 U/L 32 23 -  ALT 0 - 44 U/L 20 22 -   No results found for: VZC588 Lab Results  Component Value Date   WBC 8.1 05/07/2021   HGB 13.4 05/07/2021   HCT 42.4 05/07/2021   MCV 107.1 (H) 05/07/2021   PLT 301 05/07/2021   NEUTROABS 4.5 05/07/2021    ASSESSMENT:  1.  Stage I (T1BN0) left breast cancer: -Status post left lumpectomy on 04/20/2014, 0.9 cm IDC, grade 1, ER/PR positive, HER-2 negative, Ki-67 of 10%. -Oncotype DX score of 7, underwent radiation therapy, started on anastrozole. -Patient opted to continue anastrozole beyond 5 years. -Mammogram on 05/01/2020 was BI-RADS Category 1.   2.  Osteoporosis: - DEXA scan in 2015 showed osteoporosis. -She was started on Prolia every 6 months on 12/30/2014.  -DEXA scan in December 2017 showed T score of -2.4. -Repeat DEXA scan on 04/27/2019 showed T score of -2.1. -She will continue calcium and vitamin D supplements.  Her vitamin D is within normal limits.  She will continue Prolia at this time as long as she is on anastrozole.   PLAN:  1.  Stage I (T1BN0) left breast cancer: -She is  continuing to tolerate anastrozole well. - Physical examination today did not reveal any palpable masses.  Left breast medial quadrant lumpectomy scar is within normal limits.  No palpable adenopathy. - Reviewed her labs with her.  LFTs are normal.  She has slight macrocytosis, unexplained at this time.  Possible etiologies include fatty liver. - We will follow-up on her mammogram results from 05/07/2021 which were not read yet. - She will come back in 1 year for follow-up.  She will take anastrozole for total of 10 years.   2.  Osteoporosis: -She was started on Prolia on 12/30/2014.  Vitamin D level was normal at 39. - Her last DEXA scan on 04/27/2019 with improved T score of -2.1 in the osteopenia range. - She last got her Prolia on 01/11/2021.  She want to hold off on further Prolia secondary to cost. - Continue calcium and vitamin D supplements.  She will have DEXA scan repeated in 1 year prior to next visit.  Breast Cancer therapy associated bone loss: I have recommended calcium, Vitamin D and weight bearing exercises.  Orders placed this encounter:  No orders of the defined types were placed in this encounter.   The patient has a good understanding of the overall plan. She agrees with it. She will call with any problems that may develop before the next visit here.  Derek Jack, MD [facility] [phone]   I, Thana Ates, am acting as a scribe for Dr. Derek Jack.  I, Derek Jack MD, have reviewed the above documentation for accuracy and completeness, and I agree with the above.

## 2021-05-10 ENCOUNTER — Inpatient Hospital Stay (HOSPITAL_BASED_OUTPATIENT_CLINIC_OR_DEPARTMENT_OTHER): Payer: BC Managed Care – PPO | Admitting: Hematology

## 2021-05-10 ENCOUNTER — Other Ambulatory Visit: Payer: Self-pay

## 2021-05-10 ENCOUNTER — Other Ambulatory Visit: Payer: Self-pay | Admitting: Internal Medicine

## 2021-05-10 VITALS — BP 133/90 | HR 94 | Temp 97.7°F | Resp 16 | Wt 207.8 lb

## 2021-05-10 DIAGNOSIS — Z170 Estrogen receptor positive status [ER+]: Secondary | ICD-10-CM | POA: Diagnosis not present

## 2021-05-10 DIAGNOSIS — C50512 Malignant neoplasm of lower-outer quadrant of left female breast: Secondary | ICD-10-CM

## 2021-05-10 NOTE — Patient Instructions (Addendum)
[facility] at [facility] Discharge Instructions  You were seen and examined today by Dr. Delton Coombes.  We will call you with the results of your mammogram as soon as the radiologist reads the test.  Continue yearly mammograms.   Return as scheduled for lab work and office visit.    Thank you for choosing [facility] at [facility] to provide your oncology and hematology care.  To afford each patient quality time with our provider, please arrive at least 15 minutes before your scheduled appointment time.   If you have a lab appointment with the [facility] please come in thru the Main Entrance and check in at the main information desk.  You need to re-schedule your appointment should you arrive 10 or more minutes late.  We strive to give you quality time with our providers, and arriving late affects you and other patients whose appointments are after yours.  Also, if you no show three or more times for appointments you may be dismissed from the clinic at the providers discretion.     Again, thank you for choosing [facility].  Our hope is that these requests will decrease the amount of time that you wait before being seen by our physicians.       _____________________________________________________________  Should you have questions after your visit to [facility], please contact our office at [phone] and follow the prompts.  Our office hours are 8:00 a.m. and 4:30 p.m. Monday - Friday.  Please note that voicemails left after 4:00 p.m. may not be returned until the following business day.  We are closed weekends and major holidays.  You do have access to a nurse 24-7, just call the main number to the clinic [phone] and do not press any options, hold on the line and a nurse will answer the phone.    For prescription refill requests, have your pharmacy contact our office and allow 72 hours.    Due to Covid, you  will need to wear a mask upon entering the hospital. If you do not have a mask, a mask will be given to you at the Main Entrance upon arrival. For doctor visits, patients may have 1 support person age 22 or older with them. For treatment visits, patients can not have anyone with them due to social distancing guidelines and our immunocompromised population.

## 2021-05-11 ENCOUNTER — Other Ambulatory Visit (HOSPITAL_COMMUNITY)
Admission: RE | Admit: 2021-05-11 | Discharge: 2021-05-11 | Disposition: A | Discharge status: Home / Self Care | Payer: BC Managed Care – PPO | Source: Ambulatory Visit | Attending: Adult Health | Admitting: Adult Health

## 2021-05-11 ENCOUNTER — Ambulatory Visit (INDEPENDENT_AMBULATORY_CARE_PROVIDER_SITE_OTHER): Payer: BC Managed Care – PPO | Admitting: Adult Health

## 2021-05-11 ENCOUNTER — Encounter: Payer: Self-pay | Admitting: Adult Health

## 2021-05-11 VITALS — BP 130/88 | HR 93 | Ht 63.0 in | Wt 205.5 lb

## 2021-05-11 DIAGNOSIS — Z1211 Encounter for screening for malignant neoplasm of colon: Secondary | ICD-10-CM

## 2021-05-11 DIAGNOSIS — Z01419 Encounter for gynecological examination (general) (routine) without abnormal findings: Secondary | ICD-10-CM

## 2021-05-11 LAB — HEMOCCULT GUIAC POC 1CARD (OFFICE): Fecal Occult Blood, POC: NEGATIVE

## 2021-05-11 NOTE — Progress Notes (Signed)
Patient ID: Beth Hamilton, female   DOB: Sep 08, 1955, 65 y.o.   MRN: [id number] History of Present Illness: Beth Hamilton is a 65 year old white female,divorced, PM in for a well woman gyn exam and pap. She had check up with  Dr Beth Hamilton yesterday regarding her breast cancer and si doing well, she had a mammogram 05/07/21, but not resulted yet. PCP is Dr Beth Hamilton.  Current Medications, Allergies, Past Medical History, Past Surgical History, Family History and Social History were reviewed in [employer] record.     Review of Systems: Patient denies any headaches, hearing loss, blurred vision, shortness of breath, chest pain, abdominal pain, problems with bowel movements, urination, or intercourse.(Not active). No joint pain or mood swings.  +tired. Denies any vaginal bleeding  Physical Exam:BP 130/88 (BP Location: Right Arm, Cuff Size: Large)   Pulse 93   Ht 5' 3"  (1.6 m)   Wt 205 lb 8 oz (93.2 kg)   BMI 36.40 kg/m   General:  Well developed, well nourished, no acute distress Skin:  Warm and dry Neck:  Midline trachea, normal thyroid, good ROM, no lymphadenopathy,no carotid bruits heard Lungs; Clear to auscultation bilaterally Breast:  No dominant palpable mass, retraction, or nipple discharge Cardiovascular: Regular rate and rhythm Abdomen:  Soft, non tender, no hepatosplenomegaly Pelvic:  External genitalia is normal in appearance, no lesions.  The vagina is pale and dry with loss of rugae. Urethra has no lesions or masses. The cervix is smooth, pap with HR HPV genotyping performed.  Uterus is felt to be normal size, shape, and contour.  No adnexal masses or tenderness noted.Bladder is non tender, no masses felt. Rectal: Good sphincter tone, no polyps, or hemorrhoids felt.  Hemoccult negative. Extremities/musculoskeletal:  No swelling or varicosities noted, no clubbing or cyanosis Psych:  No mood changes, alert and cooperative,seems happy AA is 3 Fall risk is  low Depression screen [facility] 2/9 05/11/2021 04/09/2021 04/09/2021  Decreased Interest 0 3 0  Down, Depressed, Hopeless 0 1 0  PHQ - 2 Score 0 4 0  Altered sleeping 0 3 -  Tired, decreased energy 1 3 -  Change in appetite 0 3 -  Feeling bad or failure about yourself  0 0 -  Trouble concentrating 0 1 -  Moving slowly or fidgety/restless 0 1 -  Suicidal thoughts 0 0 -  PHQ-9 Score 1 15 -  Difficult doing work/chores - Somewhat difficult -  Some recent data might be hidden    GAD 7 : Generalized Anxiety Score 05/11/2021 05/05/2020  Nervous, Anxious, on Edge 0 0  Control/stop worrying 0 0  Worry too much - different things 0 0  Trouble relaxing 0 0  Restless 0 0  Easily annoyed or irritable 0 0  Afraid - awful might happen 0 0  Total GAD 7 Score 0 0      Upstream - 05/11/21 9528       Pregnancy Intention Screening   Does the patient want to become pregnant in the next year? No    Does the patient's partner want to become pregnant in the next year? No    Would the patient like to discuss contraceptive options today? No      Contraception Wrap Up   Current Method Female Sterilization    End Method Female Sterilization    Contraception Counseling Provided No            Examination chaperoned by Beth Pupa LPN   Impression and  Plan: 1. Encounter for gynecological examination with Papanicolaou smear of cervix Pap sent Physical in 1 year Pap in 3 years Mammogram yearly Colonoscopy per GI Labs with PCP  2. Encounter for screening fecal occult blood testing

## 2021-05-15 LAB — CYTOLOGY - PAP
Comment: NEGATIVE
Diagnosis: NEGATIVE
High risk HPV: NEGATIVE

## 2021-05-23 ENCOUNTER — Other Ambulatory Visit: Payer: Self-pay | Admitting: Nurse Practitioner

## 2021-05-25 ENCOUNTER — Ambulatory Visit: Payer: BC Managed Care – PPO | Admitting: Gastroenterology

## 2021-05-25 ENCOUNTER — Other Ambulatory Visit: Payer: Self-pay | Admitting: Nurse Practitioner

## 2021-05-26 ENCOUNTER — Other Ambulatory Visit: Payer: Self-pay | Admitting: Family Medicine

## 2021-07-06 ENCOUNTER — Other Ambulatory Visit (HOSPITAL_COMMUNITY): Payer: Self-pay | Admitting: Hematology

## 2021-07-06 ENCOUNTER — Other Ambulatory Visit: Payer: Self-pay | Admitting: Family Medicine

## 2021-07-06 DIAGNOSIS — M10072 Idiopathic gout, left ankle and foot: Secondary | ICD-10-CM

## 2021-07-06 DIAGNOSIS — C50512 Malignant neoplasm of lower-outer quadrant of left female breast: Secondary | ICD-10-CM

## 2021-07-09 ENCOUNTER — Encounter: Payer: Self-pay | Admitting: *Deleted

## 2021-07-10 ENCOUNTER — Ambulatory Visit: Payer: BC Managed Care – PPO | Admitting: Family Medicine

## 2021-07-20 ENCOUNTER — Other Ambulatory Visit (HOSPITAL_COMMUNITY): Payer: BC Managed Care – PPO

## 2021-07-20 ENCOUNTER — Ambulatory Visit (HOSPITAL_COMMUNITY): Payer: BC Managed Care – PPO

## 2021-07-30 ENCOUNTER — Encounter: Payer: Self-pay | Admitting: Gastroenterology

## 2021-08-11 ENCOUNTER — Other Ambulatory Visit: Payer: Self-pay | Admitting: Gastroenterology

## 2021-08-11 DIAGNOSIS — K529 Noninfective gastroenteritis and colitis, unspecified: Secondary | ICD-10-CM

## 2021-08-12 NOTE — Telephone Encounter (Signed)
Can we please find out if patient is still on Colazal along with Humira?

## 2021-08-13 ENCOUNTER — Telehealth: Payer: Self-pay | Admitting: Gastroenterology

## 2021-08-13 NOTE — Telephone Encounter (Signed)
Pt was made aware and verbalized understanding. Pt stated that she will continue the colazal until her next office visit.

## 2021-08-13 NOTE — Telephone Encounter (Signed)
Pt is still taking both.

## 2021-08-13 NOTE — Telephone Encounter (Signed)
Please let pt know that I refilled her colazal (received request from pharmacy). Since she is on Humira, and has been on it for few months, she can stop the colazal at anytime or she can continue until her next office visit.

## 2021-09-21 ENCOUNTER — Ambulatory Visit: Payer: BC Managed Care – PPO | Admitting: Gastroenterology

## 2021-10-02 ENCOUNTER — Other Ambulatory Visit: Payer: Self-pay | Admitting: Family Medicine

## 2021-10-02 DIAGNOSIS — M10072 Idiopathic gout, left ankle and foot: Secondary | ICD-10-CM

## 2021-10-05 ENCOUNTER — Ambulatory Visit: Payer: BC Managed Care – PPO | Admitting: Family Medicine

## 2021-10-10 ENCOUNTER — Encounter: Payer: Self-pay | Admitting: Gastroenterology

## 2021-10-10 ENCOUNTER — Ambulatory Visit: Payer: BC Managed Care – PPO | Admitting: Gastroenterology

## 2021-10-10 ENCOUNTER — Other Ambulatory Visit: Payer: Self-pay

## 2021-10-10 VITALS — BP 138/82 | HR 70 | Temp 97.1°F | Ht 64.0 in | Wt 188.6 lb

## 2021-10-10 DIAGNOSIS — K529 Noninfective gastroenteritis and colitis, unspecified: Secondary | ICD-10-CM

## 2021-10-10 DIAGNOSIS — Z79899 Other long term (current) drug therapy: Secondary | ICD-10-CM

## 2021-10-10 NOTE — Progress Notes (Signed)
? ? ? ?GI Office Note   ? ?Referring Provider: [name], MD ?Primary Care Physician:  [name], MD  ?Primary Gastroenterologist: Garfield Cornea, MD ? ? ?Chief Complaint  ? ?Chief Complaint  ?Patient presents with  ? Follow-up  ?  Due for colonoscopy wants to wait till summer.   ? ? ?History of Present Illness  ? ?Beth Hamilton is a 66 y.o. female presenting today for follow-up.  She was seen back in April 2022.  She has a history of indeterminate colitis.  She has been on balsalazide chronically but approximately 6 months ago she was switched to Humira.  Biological therapy was discussed after her colonoscopy in January 2021.  Initiation of treatment was somewhat delayed by pending eye surgeries. At time of that colonoscopy she was noted to have ulcerated and eroded colonic mucosa with most severe changes in the left colon specifically the sigmoid segment.  Relative sparing of the rectum and descending segment, normal TI.  She also had pseudopolyp formation.  Biopsies from the ascending colon, transverse colon were unremarkable.  She had mild active colitis involving the descending colon and rectum on biopsy.  Chronically severe active colitis noted on sigmoid colon biopsies.  CMV was negative. ?  ?This is her first visit back after being on Humira for 6 months.  She has noted some improvement in her symptoms but she anticipated more improvement than she has received.  She denies any blood in the stool.  Her stools are normal/solid.  She is having less abdominal pain but she is concerned about the amount of mucus that she passes every morning when she gets up.  She has the urge to have a bowel movement, passes only mucus.  Typically has bowel movement later.  Denies any fevers like before.  Denies any appetite issues.  She has been working to lose some weight and has lost about 21 pounds since October.  No upper GI symptoms.  No issues with infections ? ?She is due for colonoscopy, but due to her work  load she is requesting to postpone until July.  She reminds me that she has had difficulty taking large volume prep such as TriLyte before.  She used MiraLAX prep last time with good results.  She is interested in a low volume prep. ?  ?Medications  ? ?Current Outpatient Medications  ?Medication Sig Dispense Refill  ? allopurinol (ZYLOPRIM) 300 MG tablet TAKE 1 TABLET BY MOUTH EVERY DAY 45 tablet 0  ? amLODipine (NORVASC) 5 MG tablet TAKE 1 TABLET (5 MG TOTAL) BY MOUTH DAILY. 90 tablet 1  ? anastrozole (ARIMIDEX) 1 MG tablet TAKE 1 TABLET BY MOUTH EVERY DAY 90 tablet 3  ? ARMOUR THYROID 60 MG tablet Take 60 mg by mouth daily.     ? B Complex Vitamins (VITAMIN-B COMPLEX PO) Take 1 mL by mouth daily. Vit b12    ? balsalazide (COLAZAL) 750 MG capsule TAKE 3 CAPSULES (2,250 MG TOTAL) BY MOUTH 3 (THREE) TIMES DAILY 810 capsule 0  ? Calcium Citrate-Vitamin D (CITRACAL + D PO) Take by mouth.    ? fluticasone (CUTIVATE) 0.05 % cream APPLY TO AFFECTED AREAS TWICE DAILY. (Patient taking differently: as needed. APPLY TO AFFECTED AREAS TWICE DAILY.) 60 g 1  ? folic acid (FOLVITE) 765 MCG tablet Take 400 mcg by mouth daily.     ? HUMIRA PEN 40 MG/0.4ML PNKT INJECT 1 PEN UNDER THE SKIN EVERY 14 DAYS. 2 each 5  ? lisinopril-hydrochlorothiazide (  ZESTORETIC) 20-25 MG tablet Take 1 tablet by mouth daily. 90 tablet 3  ? ?No current facility-administered medications for this visit.  ? ? ?Allergies  ? ?Allergies as of 10/10/2021 - Review Complete 10/10/2021  ?Allergen Reaction Noted  ? Flagyl [metronidazole]  10/29/2013  ? Other Other (See Comments) 03/22/2014  ? Levaquin [levofloxacin in d5w]  07/23/2013  ? ?  ?Review of Systems  ? ?General: Negative for anorexia, weight loss, fever, chills, fatigue, weakness. ?ENT: Negative for hoarseness, difficulty swallowing , nasal congestion. ?CV: Negative for chest pain, angina, palpitations, dyspnea on exertion, peripheral edema.  ?Respiratory: Negative for dyspnea at rest, dyspnea on exertion,  cough, sputum, wheezing.  ?GI: See history of present illness. ?GU:  Negative for dysuria, hematuria, urinary incontinence, urinary frequency, nocturnal urination.  ?Endo: Negative for unusual weight change.  ?   ?Physical Exam  ? ?BP 138/82 (BP Location: Right Arm, Patient Position: Sitting, Cuff Size: Large)   Pulse 70   Temp (!) 97.1 ?F (36.2 ?C) (Temporal)   Ht 5' 4"  (1.626 m)   Wt 188 lb 9.6 oz (85.5 kg)   SpO2 100%   BMI 32.37 kg/m?  ?  ?General: Well-nourished, well-developed in no acute distress.  ?Eyes: No icterus. ?Mouth: masked. ?Lungs: Clear to auscultation bilaterally.  ?Heart: Regular rate and rhythm, no murmurs rubs or gallops.  ?Abdomen: Bowel sounds are normal, nontender, nondistended, no hepatosplenomegaly or masses,  ?no abdominal bruits or hernia , no rebound or guarding.  ?Rectal: not performed  ?Extremities: No lower extremity edema. No clubbing or deformities. ?Neuro: Alert and oriented x 4   ?Skin: Warm and dry, no jaundice.   ?Psych: Alert and cooperative, normal mood and affect. ? ?Labs  ? ?Lab Results  ?Component Value Date  ? CREATININE 0.75 05/07/2021  ? BUN 10 05/07/2021  ? NA 136 05/07/2021  ? K 3.7 05/07/2021  ? CL 99 05/07/2021  ? CO2 30 05/07/2021  ? ?Lab Results  ?Component Value Date  ? WBC 8.1 05/07/2021  ? HGB 13.4 05/07/2021  ? HCT 42.4 05/07/2021  ? MCV 107.1 (H) 05/07/2021  ? PLT 301 05/07/2021  ? ?Lab Results  ?Component Value Date  ? TSH 2.688 05/07/2021  ? ?Lab Results  ?Component Value Date  ? ALT 20 05/07/2021  ? AST 32 05/07/2021  ? ALKPHOS 37 (L) 05/07/2021  ? BILITOT 0.6 05/07/2021  ? ? ?Imaging Studies  ? ?No results found. ? ?Assessment  ? ?Indeterminate colitis: Last colonoscopy in 2021 with severe left-sided disease, progressed.  Mildly active disease on biopsy involving the rectum and descending colon but no disease in the right colon or terminal ileum.  She was started on Humira about 6 months ago.  She remained on balsalazide.  She is having passage of  significant amount of mucus first thing in the morning and continues to have some urgency but overall her abdominal pain, stool consistency are improved.  She also has seen no bleeding.  It would be important to see status of colonic mucosa to determine if she is responding to Humira.  Also need to update labs. ? ? ?PLAN  ? ?Labs to include CBC, CMET, B12/folate, adalimumab level and antibodies. ?Per her request, plan for surveillance colonoscopy in July.  We will put her on recall calendar and triage her at that time.  Patient desires short volume prep.  Patient desires Monday procedure if possible. ?We will have her stop balsalazide. ? ?Laureen Ochs. Deondra Wigger, MHS, PA-C ?[facility] ? ?

## 2021-10-10 NOTE — Patient Instructions (Addendum)
Please have labs done at your earliest convenience.  ?You can stop the balsalazide. ?Continue Humira 30m every 14 days.  ?We will plan for your colonoscopy in 01/2022 as requested. If you have not heard from our office by the first week of June, please contact [place] (ask for [place].  ?

## 2021-10-19 ENCOUNTER — Ambulatory Visit: Payer: BC Managed Care – PPO | Admitting: Family Medicine

## 2021-10-19 VITALS — BP 110/70 | HR 92 | Temp 97.7°F | Ht 64.0 in | Wt 185.4 lb

## 2021-10-19 DIAGNOSIS — E038 Other specified hypothyroidism: Secondary | ICD-10-CM

## 2021-10-19 DIAGNOSIS — E7849 Other hyperlipidemia: Secondary | ICD-10-CM | POA: Diagnosis not present

## 2021-10-19 DIAGNOSIS — I10 Essential (primary) hypertension: Secondary | ICD-10-CM | POA: Diagnosis not present

## 2021-10-19 DIAGNOSIS — M10072 Idiopathic gout, left ankle and foot: Secondary | ICD-10-CM | POA: Diagnosis not present

## 2021-10-19 DIAGNOSIS — Z79899 Other long term (current) drug therapy: Secondary | ICD-10-CM | POA: Diagnosis not present

## 2021-10-19 MED ORDER — ALLOPURINOL 300 MG PO TABS: 300.0000 mg | ORAL_TABLET | Freq: Every day | ORAL | 3 refills | Status: DC

## 2021-10-19 MED ORDER — LISINOPRIL-HYDROCHLOROTHIAZIDE 20-25 MG PO TABS: 1.0000 | ORAL_TABLET | Freq: Every day | ORAL | 3 refills | Status: DC

## 2021-10-19 MED ORDER — AMLODIPINE BESYLATE 5 MG PO TABS: 5.0000 mg | ORAL_TABLET | Freq: Every day | ORAL | 1 refills | Status: DC

## 2021-10-19 NOTE — Progress Notes (Signed)
? ?  Subjective:  ? ? Patient ID: Beth Hamilton, female    DOB: [birth year], 66 y.o.   MRN: [id number] ? ?HPI ? ?Patient here for 6 month follow up on blood pressure.  Patient says blood pressures have been running good. ?Patient for blood pressure check up.  The patient does have hypertension.   ? ?Medication compliance-does well with her medicines ? ?Blood pressure control recently-she believes is been under good control when she goes to other doctor offices ? ?Dietary compliance-she is watching her portions and try to bring her weight down ? ?She does have under thyroid issues she is followed by Dr.Kerr ?In addition to this patient relates that she has not had any gout flareups recently is taking her medicine as directed ?Follows with oncology on a regular basis for previous left breast cancer will be having bone density coming up in the fall along with blood work then ?She is due for some other standard blood work before then ? ?Review of Systems ? ?   ?Objective:  ? Physical Exam ? ?General-in no acute distress ?Eyes-no discharge ?Lungs-respiratory rate normal, CTA ?CV-no murmurs,RRR ?Extremities skin warm dry no edema ?Neuro grossly normal ?Behavior normal, alert ? ? ? ?   ?Assessment & Plan:  ?Blood pressure very good ?No orthostasis ?Healthy diet recommended ?Continue to lose weight ?Continue blood pressure medicines as is currently ?If her blood pressure starts dropping we will back off on some of the blood pressure medicines ?If any ongoing troubles problems issues she is to let us know she will do her blood work by September at the latest and follow-up within 6 months ? ? ?

## 2021-10-21 LAB — ADALIMUMAB+AB (SERIAL MONITOR)
Adalimumab Drug Level: 3 ug/mL
Anti-Adalimumab Antibody: 49 ng/mL

## 2021-10-21 LAB — COMPREHENSIVE METABOLIC PANEL
ALT: 13 IU/L (ref 0–32)
AST: 19 IU/L (ref 0–40)
Albumin/Globulin Ratio: 1.4 (ref 1.2–2.2)
Albumin: 4 g/dL (ref 3.8–4.8)
Alkaline Phosphatase: 93 IU/L (ref 44–121)
BUN/Creatinine Ratio: 16 (ref 12–28)
BUN: 13 mg/dL (ref 8–27)
Bilirubin Total: 0.6 mg/dL (ref 0.0–1.2)
CO2: 25 mmol/L (ref 20–29)
Calcium: 9.7 mg/dL (ref 8.7–10.3)
Chloride: 99 mmol/L (ref 96–106)
Creatinine, Ser: 0.82 mg/dL (ref 0.57–1.00)
Globulin, Total: 2.9 g/dL (ref 1.5–4.5)
Glucose: 117 mg/dL — ABNORMAL HIGH (ref 70–99)
Potassium: 4.4 mmol/L (ref 3.5–5.2)
Sodium: 139 mmol/L (ref 134–144)
Total Protein: 6.9 g/dL (ref 6.0–8.5)
eGFR: 80 mL/min/{1.73_m2} (ref >59)

## 2021-10-21 LAB — CBC WITH DIFFERENTIAL/PLATELET
Basophils Absolute: 0.1 10*3/uL (ref 0.0–0.2)
Basos: 0 %
EOS (ABSOLUTE): 0.1 10*3/uL (ref 0.0–0.4)
Eos: 1 %
Hematocrit: 42.7 % (ref 34.0–46.6)
Hemoglobin: 14.3 g/dL (ref 11.1–15.9)
Immature Grans (Abs): 0.1 10*3/uL (ref 0.0–0.1)
Immature Granulocytes: 1 %
Lymphocytes Absolute: 2.9 10*3/uL (ref 0.7–3.1)
Lymphs: 21 %
MCH: 34.2 pg — ABNORMAL HIGH (ref 26.6–33.0)
MCHC: 33.5 g/dL (ref 31.5–35.7)
MCV: 102 fL — ABNORMAL HIGH (ref 79–97)
Monocytes Absolute: 1.1 10*3/uL — ABNORMAL HIGH (ref 0.1–0.9)
Monocytes: 8 %
Neutrophils Absolute: 9.2 10*3/uL — ABNORMAL HIGH (ref 1.4–7.0)
Neutrophils: 69 %
Platelets: 283 10*3/uL (ref 150–450)
RBC: 4.18 x10E6/uL (ref 3.77–5.28)
RDW: 12.7 % (ref 11.7–15.4)
WBC: 13.5 10*3/uL — ABNORMAL HIGH (ref 3.4–10.8)

## 2021-10-21 LAB — B12 AND FOLATE PANEL
Folate: >20 ng/mL (ref >3.0)
Vitamin B-12: 1158 pg/mL (ref 232–1245)

## 2021-10-30 ENCOUNTER — Telehealth: Payer: Self-pay

## 2021-10-30 NOTE — Telephone Encounter (Signed)
PA for Humira Pen has been approved from 10/29/2021 through 10/30/2022. Approval letter to be scanned into patient's chart.  ?

## 2021-11-05 ENCOUNTER — Telehealth: Payer: Self-pay | Admitting: Internal Medicine

## 2021-11-05 NOTE — Telephone Encounter (Signed)
Patient called and said that her pharmacy needs authorization for humira for 7 days, she needs it by Thursday  ?

## 2021-11-05 NOTE — Telephone Encounter (Signed)
Spoke with pt and informed her that a PA has been initiated and we are waiting on approval/denial.  ?

## 2021-11-06 NOTE — Telephone Encounter (Signed)
The PA for the patient's weekly humira injections has been denied. Pt is due for her humira injection on Thursday. The PA for every 14 days was approved. Please advise.  ?

## 2021-11-06 NOTE — Telephone Encounter (Signed)
Lmom for pt to return my call.  

## 2021-11-06 NOTE — Telephone Encounter (Signed)
Continue Humira 22m [place] every other week for now while we work on appeal. How can I appeal? ?

## 2021-11-06 NOTE — Telephone Encounter (Signed)
You will have to write a letter stating why the patient needs the humira weekly instead of every 14 days.  ?

## 2021-11-07 MED ORDER — HUMIRA (2 PEN) 40 MG/0.4ML ~~LOC~~ AJKT
AUTO-INJECTOR | SUBCUTANEOUS | 5 refills | Status: DC
Start: 2021-11-07 — End: 2021-11-26

## 2021-11-07 NOTE — Telephone Encounter (Signed)
Done. Still working on letter for appeal. ?

## 2021-11-07 NOTE — Addendum Note (Signed)
Addended by: Mahala Menghini on: 11/07/2021 01:12 PM ? ? Modules accepted: Orders ? ?

## 2021-11-07 NOTE — Telephone Encounter (Signed)
Pt is needing a prescription for the humira every two weeks sent to [facility].  ?

## 2021-11-07 NOTE — Telephone Encounter (Signed)
Noted  

## 2021-11-09 ENCOUNTER — Other Ambulatory Visit: Payer: Self-pay | Admitting: Nurse Practitioner

## 2021-11-12 ENCOUNTER — Other Ambulatory Visit: Payer: Self-pay | Admitting: Gastroenterology

## 2021-11-12 DIAGNOSIS — K529 Noninfective gastroenteritis and colitis, unspecified: Secondary | ICD-10-CM

## 2021-11-19 NOTE — Telephone Encounter (Signed)
Patient called in checking on status of humira. ?

## 2021-11-21 ENCOUNTER — Encounter: Payer: Self-pay | Admitting: Gastroenterology

## 2021-11-21 NOTE — Telephone Encounter (Signed)
Lmom for pt to return my call.  

## 2021-11-21 NOTE — Progress Notes (Signed)
Letter to appeals written. Please forward to appropriate persons.  ? ?

## 2021-11-21 NOTE — Telephone Encounter (Signed)
Tammy, letter to appeals routed to you today. Please send to appeal dept. ?

## 2021-11-21 NOTE — Telephone Encounter (Signed)
Pt was made aware that an appeal letter has been sent to her insurance and that we are waiting on an approval/denial.  ?

## 2021-11-26 ENCOUNTER — Encounter: Payer: Self-pay | Admitting: Internal Medicine

## 2021-11-26 ENCOUNTER — Telehealth: Payer: Self-pay

## 2021-11-26 ENCOUNTER — Other Ambulatory Visit: Payer: Self-pay | Admitting: Gastroenterology

## 2021-11-26 MED ORDER — HUMIRA (2 PEN) 40 MG/0.4ML ~~LOC~~ AJKT: 40.0000 mg | AUTO-INJECTOR | SUBCUTANEOUS | 5 refills | Status: DC

## 2021-11-26 NOTE — Telephone Encounter (Signed)
Pt was made aware. Routing to the front to schedule f/u visit in 2-3 mths.  ?

## 2021-11-26 NOTE — Telephone Encounter (Signed)
OK great! Please make sure patient is aware.  ? ?Let's make sure she has follow up visit in 2-3 months.  ?

## 2021-11-26 NOTE — Telephone Encounter (Signed)
PA for Humira Pen 40 MG/0.4ML PNKT has been approved for weekly dosing. The approval is good from 10/27/2021 through 11/27/2022. ?

## 2021-11-26 NOTE — Telephone Encounter (Signed)
New RX for weekly dosing needs to be resent in to [facility].  ?

## 2021-11-26 NOTE — Progress Notes (Unsigned)
Done

## 2022-01-06 ENCOUNTER — Encounter: Payer: Self-pay | Admitting: Emergency Medicine

## 2022-01-06 ENCOUNTER — Emergency Department (HOSPITAL_COMMUNITY): Payer: BC Managed Care – PPO

## 2022-01-06 ENCOUNTER — Ambulatory Visit
Admission: EM | Admit: 2022-01-06 | Discharge: 2022-01-06 | Disposition: A | Discharge status: Nursing Home (Includes ICF) | Payer: BC Managed Care – PPO

## 2022-01-06 ENCOUNTER — Other Ambulatory Visit: Payer: Self-pay

## 2022-01-06 ENCOUNTER — Encounter (HOSPITAL_COMMUNITY): Payer: Self-pay | Admitting: Emergency Medicine

## 2022-01-06 ENCOUNTER — Emergency Department (HOSPITAL_COMMUNITY)
Admission: EM | Admit: 2022-01-06 | Discharge: 2022-01-06 | Disposition: A | Discharge status: Home / Self Care | Payer: BC Managed Care – PPO | Attending: Emergency Medicine | Admitting: Emergency Medicine

## 2022-01-06 DIAGNOSIS — Z23 Encounter for immunization: Secondary | ICD-10-CM | POA: Diagnosis not present

## 2022-01-06 DIAGNOSIS — S61210A Laceration without foreign body of right index finger without damage to nail, initial encounter: Secondary | ICD-10-CM | POA: Diagnosis not present

## 2022-01-06 DIAGNOSIS — S61411A Laceration without foreign body of right hand, initial encounter: Secondary | ICD-10-CM | POA: Diagnosis not present

## 2022-01-06 DIAGNOSIS — S0181XA Laceration without foreign body of other part of head, initial encounter: Secondary | ICD-10-CM | POA: Insufficient documentation

## 2022-01-06 DIAGNOSIS — S0993XA Unspecified injury of face, initial encounter: Secondary | ICD-10-CM | POA: Diagnosis present

## 2022-01-06 DIAGNOSIS — I10 Essential (primary) hypertension: Secondary | ICD-10-CM | POA: Diagnosis not present

## 2022-01-06 DIAGNOSIS — Z853 Personal history of malignant neoplasm of breast: Secondary | ICD-10-CM | POA: Insufficient documentation

## 2022-01-06 DIAGNOSIS — E039 Hypothyroidism, unspecified: Secondary | ICD-10-CM | POA: Diagnosis not present

## 2022-01-06 DIAGNOSIS — S0990XA Unspecified injury of head, initial encounter: Secondary | ICD-10-CM | POA: Diagnosis not present

## 2022-01-06 DIAGNOSIS — S0083XA Contusion of other part of head, initial encounter: Secondary | ICD-10-CM

## 2022-01-06 DIAGNOSIS — Z79899 Other long term (current) drug therapy: Secondary | ICD-10-CM | POA: Diagnosis not present

## 2022-01-06 DIAGNOSIS — S0512XA Contusion of eyeball and orbital tissues, left eye, initial encounter: Secondary | ICD-10-CM | POA: Diagnosis not present

## 2022-01-06 DIAGNOSIS — T07XXXA Unspecified multiple injuries, initial encounter: Secondary | ICD-10-CM

## 2022-01-06 DIAGNOSIS — S0511XA Contusion of eyeball and orbital tissues, right eye, initial encounter: Secondary | ICD-10-CM | POA: Insufficient documentation

## 2022-01-06 DIAGNOSIS — W109XXA Fall (on) (from) unspecified stairs and steps, initial encounter: Secondary | ICD-10-CM | POA: Diagnosis not present

## 2022-01-06 MED ORDER — LIDOCAINE-EPINEPHRINE (PF) 2 %-1:200000 IJ SOLN
10.0000 mL | Freq: Once | INTRAMUSCULAR | Status: AC
  Administered 2022-01-06: 10 mL
  Filled 2022-01-06: qty 20

## 2022-01-06 MED ORDER — TETANUS-DIPHTH-ACELL PERTUSSIS 5-2.5-18.5 LF-MCG/0.5 IM SUSY
0.5000 mL | PREFILLED_SYRINGE | Freq: Once | INTRAMUSCULAR | Status: AC
  Administered 2022-01-06: 0.5 mL via INTRAMUSCULAR
  Filled 2022-01-06: qty 0.5

## 2022-01-06 NOTE — ED Triage Notes (Addendum)
Pt reports was walking up stairs and reports "I'm not sure if I lost my balance or what happened" but reports fell and hit face and slid back down approximately 5-6 steps. Pt noted to have right sided forehead laceration, bilateral eye bruising and swelling, abrasions noted to right hand, bruises to right upper arm.   Denies loc. Pt alert and oriented. Denies being on blood thinners. PA aware and at bedside.

## 2022-01-06 NOTE — ED Notes (Signed)
Patient is being discharged from the Urgent Care and sent to the Emergency Department via POV . Per PA, patient is in need of higher level of care due to facial trauma/fall. Patient is aware and verbalizes understanding of plan of care.  Vitals:   01/06/22 1016  BP: (!) 159/96  Pulse: (!) 101  Resp: 18  Temp: 98.1 F (36.7 C)  SpO2: 95%

## 2022-01-06 NOTE — ED Provider Notes (Signed)
[facility] EMERGENCY DEPARTMENT Provider Note   CSN: [id number] Arrival date & time: 01/06/22  1031     History  Chief Complaint  Patient presents with   Lytle Michaels    Beth Hamilton is a 66 y.o. female.  Pt is a 66 yo female with pmhx significant for breast cancer, htn, hypothyroidism.  She fell walking up the stairs while drinking wine last night around 7pm.  Pt said she fell onto her face and then slid down 6 stairs.  She has been able to ambulate.  She did go to [facility] who sent her here.  Pt denies any n/v.  No dizziness.  No blood thinners.       Home Medications Prior to Admission medications   Medication Sig Start Date End Date Taking? Authorizing Provider  Adalimumab (HUMIRA PEN) 40 MG/0.4ML PNKT Inject 40 mg into the skin every 7 (seven) days. 11/26/21   Mahala Menghini, PA-C  allopurinol (ZYLOPRIM) 300 MG tablet Take 1 tablet (300 mg total) by mouth daily. 10/19/21   [name], MD  amLODipine (NORVASC) 5 MG tablet Take 1 tablet (5 mg total) by mouth daily. 10/19/21   [name], MD  anastrozole (ARIMIDEX) 1 MG tablet TAKE 1 TABLET BY MOUTH EVERY DAY 07/06/21   Derek Jack, MD  ARMOUR THYROID 60 MG tablet Take 60 mg by mouth daily.  11/16/18   [provider]  B Complex Vitamins (VITAMIN-B COMPLEX PO) Take 1 mL by mouth daily. Vit b12    [provider]  balsalazide (COLAZAL) 750 MG capsule TAKE 3 CAPSULES (2,250 MG TOTAL) BY MOUTH 3 (THREE) TIMES DAILY 11/13/21   Mahala Menghini, PA-C  Calcium Citrate-Vitamin D (CITRACAL + D PO) Take by mouth.    [provider]  fluticasone (CUTIVATE) 0.05 % cream APPLY TO AFFECTED AREAS TWICE DAILY. Patient taking differently: as needed. APPLY TO AFFECTED AREAS TWICE DAILY. 12/18/18   [name], MD  folic acid (FOLVITE) 725 MCG tablet Take 400 mcg by mouth daily.     [provider]  lisinopril-hydrochlorothiazide (ZESTORETIC) 20-25 MG tablet Take 1 tablet by mouth daily. 10/19/21    [name], MD      Allergies    Flagyl [metronidazole], Other, and Levaquin [levofloxacin in d5w]    Review of Systems   Review of Systems  HENT:  Positive for facial swelling.        Lac to right forehead  All other systems reviewed and are negative.   Physical Exam Updated Vital Signs BP (!) 157/96   Pulse 88   Temp 97.8 F (36.6 C) (Oral)   Resp 18   Ht 5' 4"  (1.626 m)   Wt 84.1 kg   SpO2 96%   BMI 31.83 kg/m  Physical Exam Vitals and nursing note reviewed.  HENT:     Head: Normocephalic.     Comments: Lac to right forehead.  Bruising around both eyes. EOMI w/o pain    Right Ear: External ear normal.     Left Ear: External ear normal.     Mouth/Throat:     Mouth: Mucous membranes are moist.  Cardiovascular:     Rate and Rhythm: Normal rate and regular rhythm.     Pulses: Normal pulses.     Heart sounds: Normal heart sounds.  Pulmonary:     Effort: Pulmonary effort is normal.     Breath sounds: Normal breath sounds.  Abdominal:     General: Abdomen  is flat. Bowel sounds are normal.     Palpations: Abdomen is soft.  Musculoskeletal:        General: Normal range of motion.     Cervical back: Normal range of motion and neck supple.     Comments: Bruising to right hand with small lac to right hand  Skin:    General: Skin is warm.     Capillary Refill: Capillary refill takes less than 2 seconds.  Neurological:     General: No focal deficit present.     Mental Status: She is alert and oriented to person, place, and time.  Psychiatric:        Mood and Affect: Mood normal.        Behavior: Behavior normal.     ED Results / Procedures / Treatments   Labs (all labs ordered are listed, but only abnormal results are displayed) Labs Reviewed - No data to display  EKG None  Radiology CT Head Wo Contrast  Result Date: 01/06/2022 CLINICAL DATA:  [place] down stairs last night. EXAM: CT HEAD WITHOUT CONTRAST CT MAXILLOFACIAL WITHOUT CONTRAST CT CERVICAL  SPINE WITHOUT CONTRAST TECHNIQUE: Multidetector CT imaging of the head, cervical spine, and maxillofacial structures were performed using the standard protocol without intravenous contrast. Multiplanar CT image reconstructions of the cervical spine and maxillofacial structures were also generated. RADIATION DOSE REDUCTION: This exam was performed according to the departmental dose-optimization program which includes automated exposure control, adjustment of the mA and/or kV according to patient size and/or use of iterative reconstruction technique. COMPARISON:  None Available. FINDINGS: CT HEAD FINDINGS Brain: Ventricles, cisterns and other CSF spaces are normal. There is no mass, mass effect, shift of midline structures or acute hemorrhage. No evidence of acute infarction. Vascular: No hyperdense vessel or unexpected calcification. Skull: Normal. Negative for fracture or focal lesion. Other: Possible tiny scalp contusion over the left frontal region. CT MAXILLOFACIAL FINDINGS Osseous: No acute facial bone fracture. Orbits: Negative. No traumatic or inflammatory finding. Sinuses: Hypoplastic frontal sinuses. Paranasal sinuses are otherwise well aerated without air-fluid levels. Soft tissues: Mild soft tissue swelling over the left frontal region as well as left infraorbital face and anterolateral right mid face. CT CERVICAL SPINE FINDINGS Alignment: Normal. Skull base and vertebrae: Mild spondylosis throughout the cervical spine to include uncovertebral joint spurring. Vertebral body heights are maintained. Atlantoaxial articulation is normal. No acute fracture. No significant neural foraminal narrowing. Soft tissues and spinal canal: Prevertebral soft tissues are normal. No significant canal stenosis. Disc levels:  Disc space narrowing at the C5-6 and C6-7 levels. Upper chest: No acute findings. Other: None. IMPRESSION: 1. No acute intracranial abnormality. Possible tiny scalp contusion over the left frontal  region. 2. No acute facial bone fracture. 3. Mild soft tissue swelling over the left frontal region as well as left infraorbital face and anterolateral right mid face. 4. No acute cervical spine injury. 5. Mild spondylosis throughout the cervical spine with disc disease at the C5-6 and C6-7 levels. Electronically Signed   By: Marin Olp M.D.   On: 01/06/2022 12:01   CT Cervical Spine Wo Contrast  Result Date: 01/06/2022 CLINICAL DATA:  [place] down stairs last night. EXAM: CT HEAD WITHOUT CONTRAST CT MAXILLOFACIAL WITHOUT CONTRAST CT CERVICAL SPINE WITHOUT CONTRAST TECHNIQUE: Multidetector CT imaging of the head, cervical spine, and maxillofacial structures were performed using the standard protocol without intravenous contrast. Multiplanar CT image reconstructions of the cervical spine and maxillofacial structures were also generated. RADIATION DOSE REDUCTION: This exam was  performed according to the departmental dose-optimization program which includes automated exposure control, adjustment of the mA and/or kV according to patient size and/or use of iterative reconstruction technique. COMPARISON:  None Available. FINDINGS: CT HEAD FINDINGS Brain: Ventricles, cisterns and other CSF spaces are normal. There is no mass, mass effect, shift of midline structures or acute hemorrhage. No evidence of acute infarction. Vascular: No hyperdense vessel or unexpected calcification. Skull: Normal. Negative for fracture or focal lesion. Other: Possible tiny scalp contusion over the left frontal region. CT MAXILLOFACIAL FINDINGS Osseous: No acute facial bone fracture. Orbits: Negative. No traumatic or inflammatory finding. Sinuses: Hypoplastic frontal sinuses. Paranasal sinuses are otherwise well aerated without air-fluid levels. Soft tissues: Mild soft tissue swelling over the left frontal region as well as left infraorbital face and anterolateral right mid face. CT CERVICAL SPINE FINDINGS Alignment: Normal. Skull base and  vertebrae: Mild spondylosis throughout the cervical spine to include uncovertebral joint spurring. Vertebral body heights are maintained. Atlantoaxial articulation is normal. No acute fracture. No significant neural foraminal narrowing. Soft tissues and spinal canal: Prevertebral soft tissues are normal. No significant canal stenosis. Disc levels:  Disc space narrowing at the C5-6 and C6-7 levels. Upper chest: No acute findings. Other: None. IMPRESSION: 1. No acute intracranial abnormality. Possible tiny scalp contusion over the left frontal region. 2. No acute facial bone fracture. 3. Mild soft tissue swelling over the left frontal region as well as left infraorbital face and anterolateral right mid face. 4. No acute cervical spine injury. 5. Mild spondylosis throughout the cervical spine with disc disease at the C5-6 and C6-7 levels. Electronically Signed   By: Marin Olp M.D.   On: 01/06/2022 12:01   CT Maxillofacial Wo Contrast  Result Date: 01/06/2022 CLINICAL DATA:  [place] down stairs last night. EXAM: CT HEAD WITHOUT CONTRAST CT MAXILLOFACIAL WITHOUT CONTRAST CT CERVICAL SPINE WITHOUT CONTRAST TECHNIQUE: Multidetector CT imaging of the head, cervical spine, and maxillofacial structures were performed using the standard protocol without intravenous contrast. Multiplanar CT image reconstructions of the cervical spine and maxillofacial structures were also generated. RADIATION DOSE REDUCTION: This exam was performed according to the departmental dose-optimization program which includes automated exposure control, adjustment of the mA and/or kV according to patient size and/or use of iterative reconstruction technique. COMPARISON:  None Available. FINDINGS: CT HEAD FINDINGS Brain: Ventricles, cisterns and other CSF spaces are normal. There is no mass, mass effect, shift of midline structures or acute hemorrhage. No evidence of acute infarction. Vascular: No hyperdense vessel or unexpected calcification.  Skull: Normal. Negative for fracture or focal lesion. Other: Possible tiny scalp contusion over the left frontal region. CT MAXILLOFACIAL FINDINGS Osseous: No acute facial bone fracture. Orbits: Negative. No traumatic or inflammatory finding. Sinuses: Hypoplastic frontal sinuses. Paranasal sinuses are otherwise well aerated without air-fluid levels. Soft tissues: Mild soft tissue swelling over the left frontal region as well as left infraorbital face and anterolateral right mid face. CT CERVICAL SPINE FINDINGS Alignment: Normal. Skull base and vertebrae: Mild spondylosis throughout the cervical spine to include uncovertebral joint spurring. Vertebral body heights are maintained. Atlantoaxial articulation is normal. No acute fracture. No significant neural foraminal narrowing. Soft tissues and spinal canal: Prevertebral soft tissues are normal. No significant canal stenosis. Disc levels:  Disc space narrowing at the C5-6 and C6-7 levels. Upper chest: No acute findings. Other: None. IMPRESSION: 1. No acute intracranial abnormality. Possible tiny scalp contusion over the left frontal region. 2. No acute facial bone fracture. 3. Mild soft tissue swelling over  the left frontal region as well as left infraorbital face and anterolateral right mid face. 4. No acute cervical spine injury. 5. Mild spondylosis throughout the cervical spine with disc disease at the C5-6 and C6-7 levels. Electronically Signed   By: Marin Olp M.D.   On: 01/06/2022 12:01    Procedures .Marland KitchenLaceration Repair  Date/Time: 01/06/2022 12:57 PM  Performed by: Isla Pence, MD Authorized by: Isla Pence, MD   Consent:    Consent obtained:  Verbal   Consent given by:  Patient   Risks, benefits, and alternatives were discussed: yes   Universal protocol:    Patient identity confirmed:  Verbally with patient Anesthesia:    Anesthesia method:  Local infiltration   Local anesthetic:  Lidocaine 2% WITH epi Laceration details:     Location:  Face   Face location:  Forehead   Length (cm):  4 Pre-procedure details:    Preparation:  Patient was prepped and draped in usual sterile fashion Treatment:    Area cleansed with:  Saline   Amount of cleaning:  Standard   Irrigation solution:  Sterile saline Skin repair:    Repair method:  Sutures   Suture size:  5-0   Suture material:  Prolene   Suture technique:  Simple interrupted   Number of sutures:  5 Approximation:    Approximation:  Close Repair type:    Repair type:  Simple Post-procedure details:    Dressing:  Antibiotic ointment and non-adherent dressing   Procedure completion:  Tolerated well, no immediate complications .Marland KitchenLaceration Repair  Date/Time: 01/06/2022 12:58 PM  Performed by: Isla Pence, MD Authorized by: Isla Pence, MD   Consent:    Consent obtained:  Verbal   Consent given by:  Patient Universal protocol:    Patient identity confirmed:  Verbally with patient Anesthesia:    Anesthesia method:  Local infiltration   Local anesthetic:  Lidocaine 2% WITH epi Laceration details:    Location:  Hand   Hand location:  R hand, dorsum   Length (cm):  1 Pre-procedure details:    Preparation:  Patient was prepped and draped in usual sterile fashion Treatment:    Area cleansed with:  Saline   Amount of cleaning:  Standard Skin repair:    Repair method:  Sutures   Suture size:  5-0   Suture material:  Prolene   Suture technique:  Simple interrupted   Number of sutures:  2 Approximation:    Approximation:  Close Repair type:    Repair type:  Simple Post-procedure details:    Dressing:  Antibiotic ointment   Procedure completion:  Tolerated well, no immediate complications .Marland KitchenLaceration Repair  Date/Time: 01/06/2022 12:58 PM  Performed by: Isla Pence, MD Authorized by: Isla Pence, MD   Consent:    Consent obtained:  Verbal Universal protocol:    Patient identity confirmed:  Verbally with patient Laceration details:     Location:  Finger   Finger location:  R index finger   Length (cm):  1 Treatment:    Area cleansed with:  Saline Skin repair:    Repair method:  Sutures   Suture material:  Prolene   Suture technique:  Simple interrupted   Number of sutures:  3 Approximation:    Approximation:  Close Repair type:    Repair type:  Intermediate Post-procedure details:    Procedure completion:  Tolerated well, no immediate complications     Medications Ordered in ED Medications  lidocaine-EPINEPHrine (XYLOCAINE W/EPI) 2 %-1:200000 (PF) injection  10 mL (10 mLs Infiltration Given 01/06/22 1251)  Tdap (BOOSTRIX) injection 0.5 mL (0.5 mLs Intramuscular Given 01/06/22 1250)    ED Course/ Medical Decision Making/ A&P                           Medical Decision Making Amount and/or Complexity of Data Reviewed Radiology: ordered.  Risk Prescription drug management.   This patient presents to the ED for concern of fall, this involves an extensive number of treatment options, and is a complaint that carries with it a high risk of complications and morbidity.  The differential diagnosis includes multiple trauma   Co morbidities that complicate the patient evaluation  breast cancer, htn, hypothyroidism   Additional history obtained:  Additional history obtained from epic chart review External records from outside source obtained and reviewed including son   Imaging Studies ordered:  I ordered imaging studies including CT head, CT cervical, CT face  I independently visualized and interpreted imaging which showed  IMPRESSION:  1. No acute intracranial abnormality. Possible tiny scalp contusion  over the left frontal region.  2. No acute facial bone fracture.  3. Mild soft tissue swelling over the left frontal region as well as  left infraorbital face and anterolateral right mid face.  4. No acute cervical spine injury.  5. Mild spondylosis throughout the cervical spine with disc disease   at the C5-6 and C6-7 levels.   I agree with the radiologist interpretation   Cardiac Monitoring:  The patient was maintained on a cardiac monitor.  I personally viewed and interpreted the cardiac monitored which showed an underlying rhythm of: nsr   Medicines ordered and prescription drug management:  I ordered medication including tetanus   Reevaluation of the patient after these medicines showed that the patient stayed the same I have reviewed the patients home medicines and have made adjustments as needed   Test Considered:  ct   Critical Interventions:  Ct, lac repair   Problem List / ED Course:  Fall:  no injury on ct scans.  Lacs repaired.  Pt is to return in 7 days for suture removal.  Pt is stable for d/c.     Reevaluation:  After the interventions noted above, I reevaluated the patient and found that they have :improved   Social Determinants of Health:  Lives at home   Dispostion:  After consideration of the diagnostic results and the patients response to treatment, I feel that the patent would benefit from discharge with outpatient f/u.          Final Clinical Impression(s) / ED Diagnoses Final diagnoses:  Laceration of forehead, initial encounter  Laceration of right hand without foreign body, initial encounter  Laceration of right index finger without foreign body without damage to nail, initial encounter  Contusion of face, initial encounter  Multiple contusions    Rx / DC Orders ED Discharge Orders     None         Isla Pence, MD 01/06/22 1302

## 2022-01-06 NOTE — Discharge Instructions (Addendum)
Sent to ED via POV driven by son.

## 2022-01-06 NOTE — ED Provider Notes (Signed)
[facility]    CSN: [id number] Arrival date & time: 01/06/22  1000      History   Chief Complaint Chief Complaint  Patient presents with   Fall    HPI Beth Hamilton is a 66 y.o. female presenting following fall down stairs.  History noncontributory.  Patient states that she was trying to walk up some stairs 1 day ago following drinking wine.  She fell face forward and then slid down 5 or 6 stairs.  She does not think she lost consciousness, but the memory of the fall is foggy due to etoh.  Today she is concerned about the laceration on her right forehead.  She has pain of multiple sites and extensive bruising on the face, though she denies severe headaches, vision changes, unusual drowsiness, pain with eye movements.  HPI  Past Medical History:  Diagnosis Date   Breast cancer ([place]) 04/20/14   left breast   Complication of anesthesia    not sure what med was given in 96 with D&C, was very sore all over. Dr Patsey Berthold did anesthesia. Dr Heide Spark did surgery   Gout    Hemorrhoids    History of breast cancer 05/19/2015   Hypertension    Hypothyroidism    IBD (inflammatory bowel disease)    UC   Personal history of radiation therapy    2015   Pneumonia    Psoriasis    Thyroid disease    Ulcerative colitis ([place]) 5 1 2007   Dr Gala Romney    Patient Active Problem List   Diagnosis Date Noted   Postmenopause 05/05/2020   Encounter for screening fecal occult blood testing 05/05/2020   Encounter for well woman exam with routine gynecological exam 05/05/2020   IBD (inflammatory bowel disease)    Fasting hyperglycemia 06/14/2018   Hypertension 05/29/2018   Screening for colorectal cancer 05/29/2018   Encounter for gynecological examination with Papanicolaou smear of cervix 05/29/2018   Well woman exam with routine gynecological exam 05/27/2017   Positive fecal occult blood test 05/27/2017   FUO (fever of unknown origin) 08/21/2015   Hyperlipidemia 06/07/2015    History of breast cancer 05/19/2015   Hypothyroidism 12/07/2014   Osteoporosis 12/07/2014   Breast cancer of lower-outer quadrant of left female breast ([place]) 05/24/2014   HTN (hypertension), benign 11/03/2013   UC (ulcerative colitis) ([place]) 06/30/2009   HEMORRHOIDS 03/01/2009   DIARRHEA 03/01/2009   ABDOMINAL CRAMPS 03/01/2009    Past Surgical History:  Procedure Laterality Date   BIOPSY  08/02/2019   Procedure: BIOPSY;  Surgeon: Daneil Dolin, MD;  [facility];  Service: Endoscopy;;  colon   BREAST BIOPSY     BREAST LUMPECTOMY Left 04/20/2014   COLONOSCOPY  10/09/2001   RMR: Internal hemorrhoids and anal papilla; otherwise normal rectum   COLONOSCOPY  12/10/2005   incomplete tcs-  colitis   COLONOSCOPY N/A 04/18/2014   Dr.Rourk- abnormal rectum & L colon into the mid descending segment. mucosa was erythematious as well as pale w/ some loss of normal vascular apttern. no erosions or ulcers. pt had some smooth peduculated 3-4cm polyps in the sigmoid segment most c/w pseudopolyps. the rest of the colonic mucosa appeared normal. bx= inflammatory polyps   COLONOSCOPY WITH PROPOFOL N/A 08/02/2019   Procedure: COLONOSCOPY WITH PROPOFOL;  Surgeon: Daneil Dolin, MD;  [facility];  Service: Endoscopy;  Laterality: N/A;  8:30am   DILATION AND CURETTAGE OF UTERUS  1996   Dr Heide Spark  FLEXIBLE SIGMOIDOSCOPY  02/26/2006   endoscopically normal-appearing rectum, colitis in sigmoid mucosa   PARTIAL MASTECTOMY WITH NEEDLE LOCALIZATION AND AXILLARY SENTINEL LYMPH NODE BX Left 04/20/2014   Procedure: PARTIAL MASTECTOMY AFTER NEEDLE LOCALIZATION AND AXILLARY SENTINEL LYMPH NODE BX;  Surgeon: Jamesetta So, MD;  Location: [facility];  Service: General;  Laterality: Left;   TUBAL LIGATION     WISDOM TOOTH EXTRACTION      OB History     Gravida  2   Para  1   Term  1   Preterm      AB  1   Living  1      SAB  1   IAB      Ectopic      Multiple      Live Births   1            Home Medications    Prior to Admission medications   Medication Sig Start Date End Date Taking? Authorizing Provider  Adalimumab (HUMIRA PEN) 40 MG/0.4ML PNKT Inject 40 mg into the skin every 7 (seven) days. 11/26/21   Mahala Menghini, PA-C  allopurinol (ZYLOPRIM) 300 MG tablet Take 1 tablet (300 mg total) by mouth daily. 10/19/21   [name], MD  amLODipine (NORVASC) 5 MG tablet Take 1 tablet (5 mg total) by mouth daily. 10/19/21   [name], MD  anastrozole (ARIMIDEX) 1 MG tablet TAKE 1 TABLET BY MOUTH EVERY DAY 07/06/21   Derek Jack, MD  ARMOUR THYROID 60 MG tablet Take 60 mg by mouth daily.  11/16/18   [provider]  B Complex Vitamins (VITAMIN-B COMPLEX PO) Take 1 mL by mouth daily. Vit b12    [provider]  balsalazide (COLAZAL) 750 MG capsule TAKE 3 CAPSULES (2,250 MG TOTAL) BY MOUTH 3 (THREE) TIMES DAILY 11/13/21   Mahala Menghini, PA-C  Calcium Citrate-Vitamin D (CITRACAL + D PO) Take by mouth.    [provider]  fluticasone (CUTIVATE) 0.05 % cream APPLY TO AFFECTED AREAS TWICE DAILY. Patient taking differently: as needed. APPLY TO AFFECTED AREAS TWICE DAILY. 12/18/18   [name], MD  folic acid (FOLVITE) 194 MCG tablet Take 400 mcg by mouth daily.     [provider]  lisinopril-hydrochlorothiazide (ZESTORETIC) 20-25 MG tablet Take 1 tablet by mouth daily. 10/19/21   [name], MD    Family History Family History  Problem Relation Age of Onset   Stroke Maternal Grandfather    Heart attack Maternal Grandfather    Hypertension Mother    Diabetes Paternal Grandfather    Heart disease Paternal Grandfather    Colon cancer Neg Hx     Social History Social History   Tobacco Use   Smoking status: Former    Packs/day: 0.00    Years: 15.00    Total pack years: 0.00    Types: Cigarettes    Quit date: 07/26/2005    Years since quitting: 16.4   Smokeless tobacco: Never   Tobacco comments:     quit in 2007  Vaping Use   Vaping Use: Never used  Substance Use Topics   Alcohol use: Yes    Comment: wine every day   Drug use: No     Allergies   Flagyl [metronidazole], Other, and Levaquin [levofloxacin in d5w]   Review of Systems Review of Systems  Skin:  Positive for wound.  All other systems reviewed and are negative.    Physical  Exam Triage Vital Signs ED Triage Vitals [01/06/22 1016]  Enc Vitals Group     BP (!) 159/96     Pulse Rate (!) 101     Resp 18     Temp 98.1 F (36.7 C)     Temp Source Oral     SpO2 95 %     Weight      Height      Head Circumference      Peak Flow      Pain Score 0     Pain Loc      Pain Edu?      Excl. in [place]?    No data found.  Updated Vital Signs BP (!) 159/96 (BP Location: Right Arm)   Pulse (!) 101   Temp 98.1 F (36.7 C) (Oral)   Resp 18   SpO2 95%   Visual Acuity Right Eye Distance:   Left Eye Distance:   Bilateral Distance:    Right Eye Near:   Left Eye Near:    Bilateral Near:     Physical Exam Vitals reviewed.  Constitutional:      General: She is not in acute distress.    Appearance: Normal appearance. She is not ill-appearing.  HENT:     Head: Raccoon eyes present.     Comments: TTP bilateral orbits. No pain with EOMIs. PERRLA. Pulmonary:     Effort: Pulmonary effort is normal.  Skin:    Comments: See image below R forehead with 2cm laceration, no active bleeding.  Extensive bruising on the face and orbits.   Neurological:     General: No focal deficit present.     Mental Status: She is alert and oriented to person, place, and time.     Comments: CN 2-12 grossly intact. Ambulates into room unassisted.   Psychiatric:        Mood and Affect: Mood normal.        Behavior: Behavior normal.        Thought Content: Thought content normal.        Judgment: Judgment normal.        UC Treatments / Results  Labs (all labs ordered are listed, but only abnormal results are displayed) Labs  Reviewed - No data to display  EKG   Radiology No results found.  Procedures Procedures (including critical care time)  Medications Ordered in UC Medications - No data to display  Initial Impression / Assessment and Plan / UC Course  I have reviewed the triage vital signs and the nursing notes.  Pertinent labs & imaging results that were available during my care of the patient were reviewed by me and considered in my medical decision making (see chart for details).     This patient is a very pleasant 66 y.o. year old female presenting with traumatic head injury and facial laceration. Does not take long-term anticoagulation. I have concern for orbital fracture given presentation (see image above).  At time of visit she denies unusual drowsiness, severe headaches, vision changes; she would nonetheless benefit from a CT head to rule out intracranial bleed, given traumatic nature of fall.  She has several other musculoskeletal complaints that also need to be addressed.  The patient is initially very resistant to evaluation in the emergency department, but following discussion of the severity of her injuries, she is ultimately amenable to this.  Sent to ED via POV driven by son, son verbalizes he will take her straight to the emergency department.  Final Clinical Impressions(s) / UC Diagnoses   Final diagnoses:  None   Discharge Instructions   None    ED Prescriptions   None    PDMP not reviewed this encounter.   Hazel Sams, PA-C 01/06/22 1042

## 2022-01-06 NOTE — ED Triage Notes (Signed)
Pt to the ED after falling down the stairs last night by falling forward.  Pt denies being on blood thinners, and it not sure if she lost consciousness.  Pt has a laceration to her right forehead, bruising to bilateral eyes and a laceration with bruising to her right hand.  Pt is alert and oriented.

## 2022-01-06 NOTE — ED Notes (Signed)
Wounds cleaned and dressing applied. Blood cleaned from pts face and hair.

## 2022-01-13 ENCOUNTER — Ambulatory Visit
Admission: EM | Admit: 2022-01-13 | Discharge: 2022-01-13 | Disposition: A | Discharge status: Home / Self Care | Payer: BC Managed Care – PPO

## 2022-01-13 NOTE — ED Notes (Signed)
5 sutures removed from forehead. 2 from right hand. 3 from right index finger.   Wound clean, dry, well healed, well tolerated.

## 2022-01-22 LAB — BASIC METABOLIC PANEL
BUN/Creatinine Ratio: 16 (ref 12–28)
BUN: 13 mg/dL (ref 8–27)
CO2: 24 mmol/L (ref 20–29)
Calcium: 9.8 mg/dL (ref 8.7–10.3)
Chloride: 102 mmol/L (ref 96–106)
Creatinine, Ser: 0.8 mg/dL (ref 0.57–1.00)
Glucose: 131 mg/dL — ABNORMAL HIGH (ref 70–99)
Potassium: 4.2 mmol/L (ref 3.5–5.2)
Sodium: 141 mmol/L (ref 134–144)
eGFR: 83 mL/min/{1.73_m2} (ref >59)

## 2022-01-22 LAB — TSH: TSH: 1.69 u[IU]/mL (ref 0.450–4.500)

## 2022-01-22 LAB — LIPID PANEL
Chol/HDL Ratio: 4.1 ratio (ref 0.0–4.4)
Cholesterol, Total: 181 mg/dL (ref 100–199)
HDL: 44 mg/dL (ref >39)
LDL Chol Calc (NIH): 121 mg/dL — ABNORMAL HIGH (ref 0–99)
Triglycerides: 85 mg/dL (ref 0–149)
VLDL Cholesterol Cal: 16 mg/dL (ref 5–40)

## 2022-01-22 LAB — URIC ACID: Uric Acid: 4.1 mg/dL (ref 3.0–7.2)

## 2022-01-22 LAB — T4, FREE: Free T4: 0.98 ng/dL (ref 0.82–1.77)

## 2022-01-23 ENCOUNTER — Other Ambulatory Visit: Payer: Self-pay

## 2022-01-23 DIAGNOSIS — R739 Hyperglycemia, unspecified: Secondary | ICD-10-CM

## 2022-02-26 ENCOUNTER — Ambulatory Visit: Payer: BC Managed Care – PPO | Admitting: Internal Medicine

## 2022-02-26 ENCOUNTER — Ambulatory Visit: Payer: BC Managed Care – PPO | Admitting: Gastroenterology

## 2022-02-26 ENCOUNTER — Encounter: Payer: Self-pay | Admitting: Internal Medicine

## 2022-02-26 VITALS — BP 147/74 | HR 94 | Temp 97.5°F | Ht 64.0 in | Wt 178.4 lb

## 2022-02-26 DIAGNOSIS — K529 Noninfective gastroenteritis and colitis, unspecified: Secondary | ICD-10-CM | POA: Diagnosis not present

## 2022-02-26 DIAGNOSIS — K523 Indeterminate colitis: Secondary | ICD-10-CM | POA: Diagnosis not present

## 2022-02-26 NOTE — Patient Instructions (Signed)
It was good seeing you again today!  You seem to be doing very well on Humira 40 mg every 7 days-we will continue that regimen  Decrease balsalazide/Colazal to 3 capsules twice daily for the next month and then stop  Office visit here in 4 months  At your next office visit, we will plan to set up a colonoscopy.  Continue to avoid all nonsteroidal agents  If you have any interim issues please do not hesitate to call us.

## 2022-02-26 NOTE — Progress Notes (Unsigned)
Primary Care Physician:  [name], MD Primary Gastroenterologist:  Dr. Gala Romney  Pre-Procedure History & Physical: HPI:  Beth Hamilton is a 66 y.o. 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  Humira dosing interval decreased due to a low drug level and positive antibody level.  For the past couple of months she has been taking Humira weekly.  She is responded nicely.  Having 1-3 formed bowel movements daily no blood per rectum.  No tenesmus, no abdominal pain ; sense of wellbeing is good.  She is very pleased with her progress during this time she is also been taking balsalazide -3 capsules 3 times daily. She is coming due for surveillance colonoscopy.  Past Medical History:  Diagnosis Date   Breast cancer ([place]) 04/20/14   left breast   Complication of anesthesia    not sure what med was given in 96 with D&C, was very sore all over. Dr Patsey Berthold did anesthesia. Dr Heide Spark did surgery   Gout    Hemorrhoids    History of breast cancer 05/19/2015   Hypertension    Hypothyroidism    IBD (inflammatory bowel disease)    UC   Personal history of radiation therapy    2015   Pneumonia    Psoriasis    Thyroid disease    Ulcerative colitis ([place]) 5 1 2007   Dr Gala Romney    Past Surgical History:  Procedure Laterality Date   BIOPSY  08/02/2019   Procedure: BIOPSY;  Surgeon: Daneil Dolin, MD;  [facility];  Service: Endoscopy;;  colon   BREAST BIOPSY     BREAST LUMPECTOMY Left 04/20/2014   COLONOSCOPY  10/09/2001   RMR: Internal hemorrhoids and anal papilla; otherwise normal rectum   COLONOSCOPY  12/10/2005   incomplete tcs-  colitis   COLONOSCOPY N/A 04/18/2014   Dr.Marilyn Nihiser- abnormal rectum & L colon into the mid descending segment. mucosa was erythematious as well as pale w/ some loss of normal vascular apttern. no erosions or ulcers. pt had some smooth peduculated 3-4cm polyps in the sigmoid segment most c/w pseudopolyps.  the rest of the colonic mucosa appeared normal. bx= inflammatory polyps   COLONOSCOPY WITH PROPOFOL N/A 08/02/2019   Procedure: COLONOSCOPY WITH PROPOFOL;  Surgeon: Daneil Dolin, MD;  [facility];  Service: Endoscopy;  Laterality: N/A;  8:30am   DILATION AND CURETTAGE OF UTERUS  1996   Dr Zigmund Daniel SIGMOIDOSCOPY  02/26/2006   endoscopically normal-appearing rectum, colitis in sigmoid mucosa   PARTIAL MASTECTOMY WITH NEEDLE LOCALIZATION AND AXILLARY SENTINEL LYMPH NODE BX Left 04/20/2014   Procedure: PARTIAL MASTECTOMY AFTER NEEDLE LOCALIZATION AND AXILLARY SENTINEL LYMPH NODE BX;  Surgeon: Jamesetta So, MD;  Location: [facility];  Service: General;  Laterality: Left;   TUBAL LIGATION     WISDOM TOOTH EXTRACTION      Prior to Admission medications   Medication Sig Start Date End Date Taking? Authorizing Provider  Adalimumab (HUMIRA PEN) 40 MG/0.4ML PNKT Inject 40 mg into the skin every 7 (seven) days. 11/26/21  Yes Mahala Menghini, PA-C  allopurinol (ZYLOPRIM) 300 MG tablet Take 1 tablet (300 mg total) by mouth daily. 10/19/21  Yes Luking, Elayne Snare, MD  amLODipine (NORVASC) 5 MG tablet Take 1 tablet (5 mg total) by mouth daily. 10/19/21  Yes [name], MD  anastrozole (ARIMIDEX) 1 MG tablet TAKE 1 TABLET BY MOUTH EVERY DAY 07/06/21  Yes  Derek Jack, MD  ARMOUR THYROID 60 MG tablet Take 60 mg by mouth daily.  11/16/18  Yes [provider]  B Complex Vitamins (VITAMIN-B COMPLEX PO) Take 1 mL by mouth daily. Vit b12   Yes [provider]  balsalazide (COLAZAL) 750 MG capsule TAKE 3 CAPSULES (2,250 MG TOTAL) BY MOUTH 3 (THREE) TIMES DAILY 11/13/21  Yes Mahala Menghini, PA-C  Calcium Citrate-Vitamin D (CITRACAL + D PO) Take by mouth.   Yes [provider]  fluticasone (CUTIVATE) 0.05 % cream APPLY TO AFFECTED AREAS TWICE DAILY. Patient taking differently: as needed. APPLY TO AFFECTED AREAS TWICE DAILY. 12/18/18  Yes [name], MD  folic  acid (FOLVITE) 174 MCG tablet Take 400 mcg by mouth daily.    Yes [provider]  lisinopril-hydrochlorothiazide (ZESTORETIC) 20-25 MG tablet Take 1 tablet by mouth daily. 10/19/21  Yes [name], MD    Allergies as of 02/26/2022 - Review Complete 02/26/2022  Allergen Reaction Noted   Flagyl [metronidazole]  10/29/2013   Other Other (See Comments) 03/22/2014   Levaquin [levofloxacin in d5w]  07/23/2013    Family History  Problem Relation Age of Onset   Stroke Maternal Grandfather    Heart attack Maternal Grandfather    Hypertension Mother    Diabetes Paternal Grandfather    Heart disease Paternal Grandfather    Colon cancer Neg Hx     Social History   Socioeconomic History   Marital status: Divorced    Spouse name: Not on file   Number of children: 1   Years of education: Not on file   Highest education level: Not on file  Occupational History   Not on file  Tobacco Use   Smoking status: Former    Packs/day: 0.00    Years: 15.00    Total pack years: 0.00    Types: Cigarettes    Quit date: 07/26/2005    Years since quitting: 16.6   Smokeless tobacco: Never   Tobacco comments:    quit in 2007  Vaping Use   Vaping Use: Never used  Substance and Sexual Activity   Alcohol use: Yes    Alcohol/week: 14.0 standard drinks of alcohol    Types: 14 Glasses of wine per week    Comment: wine every day   Drug use: No   Sexual activity: Not Currently    Birth control/protection: Post-menopausal, Abstinence, Surgical    Comment: tubal  Other Topics Concern   Not on file  Social History Narrative   Not on file   Social Determinants of Health   Financial Resource Strain: Low Risk  (05/11/2021)   Overall Financial Resource Strain (CARDIA)    Difficulty of Paying Living Expenses: Not hard at all  Food Insecurity: No Food Insecurity (05/11/2021)   Hunger Vital Sign    Worried About Running Out of Food in the Last Year: Never true    Ran Out of Food in the Last  Year: Never true  Transportation Needs: No Transportation Needs (05/11/2021)   PRAPARE - Hydrologist (Medical): No    Lack of Transportation (Non-Medical): No  Physical Activity: Insufficiently Active (05/11/2021)   Exercise Vital Sign    Days of Exercise per Week: 2 days    Minutes of Exercise per Session: 20 min  Stress: No Stress Concern Present (05/11/2021)   [place]    Feeling of Stress : Not at all  Social Connections: Moderately Isolated (05/11/2021)   Social Connection and Isolation Panel [NHANES]    Frequency of Communication with Friends and Family: More than three times a week    Frequency of Social Gatherings with Friends and Family: Once a week    Attends Religious Services: More than 4 times per year    Active Member of [employer] or Organizations: No    Attends Archivist Meetings: Never    Marital Status: Separated  Intimate Partner Violence: Not At Risk (05/11/2021)   Humiliation, Afraid, Rape, and Kick questionnaire    Fear of Current or Ex-Partner: No    Emotionally Abused: No    Physically Abused: No    Sexually Abused: No    Review of Systems: See HPI, otherwise negative ROS  Physical Exam: BP (!) 147/74 (BP Location: Right Arm, Patient Position: Sitting, Cuff Size: Normal)   Pulse 94   Temp (!) 97.5 F (36.4 C) (Temporal)   Ht 5' 4"  (1.626 m)   Wt 178 lb 6.4 oz (80.9 kg)   SpO2 97%   BMI 30.62 kg/m  General:   Alert,  Well-developed, well-nourished, pleasant and cooperative in NAD  Impression: 66 year old lady with 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  Decrease dosing interval clinically has been associated with a nice response.  Hopefully, antibody level has been overcome with more frequent dosing.  Concomitantly taking balsalazide. Hopefully, we can taper off this therapy.  Due for  colonoscopy soon  Recommendations Continue Humira weekly.  Decrease balsalazide/Colazal to 3 capsules twice daily for the next month and then stop  Office visit here in 4 months  At your next office visit, we will plan to set up a colonoscopy.  Continue to avoid all nonsteroidal agents  If you have any interim issues please do not hesitate to call us.    Notice: This dictation was prepared with Dragon dictation along with smaller phrase technology. Any transcriptional errors that result from this process are unintentional and may not be corrected upon review.

## 2022-02-27 ENCOUNTER — Encounter: Payer: Self-pay | Admitting: Family Medicine

## 2022-02-27 LAB — HEMOGLOBIN A1C
Est. average glucose Bld gHb Est-mCnc: 134 mg/dL
Hgb A1c MFr Bld: 6.3 % — ABNORMAL HIGH (ref 4.8–5.6)

## 2022-04-09 ENCOUNTER — Telehealth: Payer: Self-pay

## 2022-04-09 NOTE — Telephone Encounter (Signed)
Pt called stating that she is having a flare up of her ulcerative colitis and has started back on the balsalazide. She is taking capsules three times a day. Pt states that she is constantly going to the bathroom. She is going out of town on Sat and will be gone for a week. Pt is wanting to know if you are wanting her to start prednisone or what. Please advise.

## 2022-04-10 ENCOUNTER — Other Ambulatory Visit: Payer: Self-pay

## 2022-04-10 MED ORDER — PREDNISONE 10 MG PO TABS: 20.0000 mg | ORAL_TABLET | Freq: Every day | ORAL | 0 refills | Status: AC

## 2022-04-10 NOTE — Telephone Encounter (Signed)
Pt was made aware and verbalized understanding.

## 2022-04-19 ENCOUNTER — Ambulatory Visit: Payer: Self-pay | Admitting: Family Medicine

## 2022-04-30 ENCOUNTER — Other Ambulatory Visit: Payer: Self-pay | Admitting: Family Medicine

## 2022-04-30 ENCOUNTER — Telehealth: Payer: Self-pay

## 2022-04-30 NOTE — Telephone Encounter (Signed)
Pt called with an update. Pt states that she finished the prednisone last week. Pt is only having a bm once to twice daily which is tremendously better. She is still on the weekly dose of humira and is still taking the balsalazide three times daily. Pt states that she is doing much better.

## 2022-05-01 ENCOUNTER — Other Ambulatory Visit: Payer: Self-pay | Admitting: Gastroenterology

## 2022-05-01 NOTE — Telephone Encounter (Signed)
Please schedule the pt an appt with Dr. Gala Romney in 3 mths.

## 2022-05-09 ENCOUNTER — Inpatient Hospital Stay: Payer: BC Managed Care – PPO | Attending: Hematology

## 2022-05-09 ENCOUNTER — Ambulatory Visit: Payer: BC Managed Care – PPO | Admitting: Family Medicine

## 2022-05-09 ENCOUNTER — Ambulatory Visit (HOSPITAL_COMMUNITY)
Admission: RE | Admit: 2022-05-09 | Discharge: 2022-05-09 | Disposition: A | Discharge status: Home / Self Care | Payer: BC Managed Care – PPO | Source: Ambulatory Visit | Attending: Hematology | Admitting: Hematology

## 2022-05-09 ENCOUNTER — Ambulatory Visit (HOSPITAL_COMMUNITY): Payer: BC Managed Care – PPO

## 2022-05-09 VITALS — BP 126/68 | HR 84 | Temp 97.7°F | Ht 64.0 in | Wt 180.0 lb

## 2022-05-09 DIAGNOSIS — C50512 Malignant neoplasm of lower-outer quadrant of left female breast: Secondary | ICD-10-CM | POA: Insufficient documentation

## 2022-05-09 DIAGNOSIS — Z79811 Long term (current) use of aromatase inhibitors: Secondary | ICD-10-CM | POA: Diagnosis not present

## 2022-05-09 DIAGNOSIS — Z170 Estrogen receptor positive status [ER+]: Secondary | ICD-10-CM | POA: Insufficient documentation

## 2022-05-09 DIAGNOSIS — R7303 Prediabetes: Secondary | ICD-10-CM | POA: Diagnosis not present

## 2022-05-09 DIAGNOSIS — M810 Age-related osteoporosis without current pathological fracture: Secondary | ICD-10-CM | POA: Diagnosis not present

## 2022-05-09 DIAGNOSIS — E7849 Other hyperlipidemia: Secondary | ICD-10-CM

## 2022-05-09 DIAGNOSIS — M816 Localized osteoporosis [Lequesne]: Secondary | ICD-10-CM

## 2022-05-09 LAB — CBC WITH DIFFERENTIAL/PLATELET
Abs Immature Granulocytes: 0.02 10*3/uL (ref 0.00–0.07)
Basophils Absolute: 0 10*3/uL (ref 0.0–0.1)
Basophils Relative: 1 %
Eosinophils Absolute: 0.1 10*3/uL (ref 0.0–0.5)
Eosinophils Relative: 1 %
HCT: 41.7 % (ref 36.0–46.0)
Hemoglobin: 13.7 g/dL (ref 12.0–15.0)
Immature Granulocytes: 0 %
Lymphocytes Relative: 28 %
Lymphs Abs: 2.1 10*3/uL (ref 0.7–4.0)
MCH: 34.4 pg — ABNORMAL HIGH (ref 26.0–34.0)
MCHC: 32.9 g/dL (ref 30.0–36.0)
MCV: 104.8 fL — ABNORMAL HIGH (ref 80.0–100.0)
Monocytes Absolute: 0.7 10*3/uL (ref 0.1–1.0)
Monocytes Relative: 9 %
Neutro Abs: 4.6 10*3/uL (ref 1.7–7.7)
Neutrophils Relative %: 61 %
Platelets: 245 10*3/uL (ref 150–400)
RBC: 3.98 MIL/uL (ref 3.87–5.11)
RDW: 14.8 % (ref 11.5–15.5)
WBC: 7.5 10*3/uL (ref 4.0–10.5)
nRBC: 0 % (ref 0.0–0.2)

## 2022-05-09 LAB — COMPREHENSIVE METABOLIC PANEL
ALT: 16 U/L (ref 0–44)
AST: 19 U/L (ref 15–41)
Albumin: 3.6 g/dL (ref 3.5–5.0)
Alkaline Phosphatase: 72 U/L (ref 38–126)
Anion gap: 8 (ref 5–15)
BUN: 14 mg/dL (ref 8–23)
CO2: 27 mmol/L (ref 22–32)
Calcium: 9.1 mg/dL (ref 8.9–10.3)
Chloride: 104 mmol/L (ref 98–111)
Creatinine, Ser: 0.61 mg/dL (ref 0.44–1.00)
GFR, Estimated: >60 mL/min (ref >60)
Glucose, Bld: 154 mg/dL — ABNORMAL HIGH (ref 70–99)
Potassium: 4.2 mmol/L (ref 3.5–5.1)
Sodium: 139 mmol/L (ref 135–145)
Total Bilirubin: 0.9 mg/dL (ref 0.3–1.2)
Total Protein: 6.9 g/dL (ref 6.5–8.1)

## 2022-05-09 MED ORDER — AMLODIPINE BESYLATE 5 MG PO TABS: 5.0000 mg | ORAL_TABLET | Freq: Every day | ORAL | 1 refills | Status: DC

## 2022-05-09 NOTE — Progress Notes (Signed)
   Subjective:    Patient ID: Beth Hamilton, female    DOB: 03/10/1956, 66 y.o.   MRN: [id number]  HPI Here today review her lab work She states she could do a better job with eating She does try to stay physically active Denies any major setbacks No chest pressure tightness Recent lab work done by cancer center shows elevated glucose Previous A1c showed prediabetes    Review of Systems     Objective:   Physical Exam General-in no acute distress Eyes-no discharge Lungs-respiratory rate normal, CTA CV-no murmurs,RRR Extremities skin warm dry no edema Neuro grossly normal Behavior normal, alert        Assessment & Plan:  Additional lab work before next visit Healthy diet printout was given to her fit in walking on a regular basis along with healthy diet try to bring her weight down although it is difficult for her to bring her weight down because of her underlying ulcerative colitis issues We will wait to see what her labs show  As for the osteoporosis she was on Prolia but she states that it was high cost she will follow-up with oncology in the near future I have encouraged her to discuss with them possibility of Fosamax

## 2022-05-10 ENCOUNTER — Ambulatory Visit (HOSPITAL_COMMUNITY)
Admission: RE | Admit: 2022-05-10 | Discharge: 2022-05-10 | Disposition: A | Discharge status: Home / Self Care | Payer: BC Managed Care – PPO | Source: Ambulatory Visit | Attending: Hematology | Admitting: Hematology

## 2022-05-10 DIAGNOSIS — Z853 Personal history of malignant neoplasm of breast: Secondary | ICD-10-CM | POA: Diagnosis not present

## 2022-05-10 DIAGNOSIS — Z1231 Encounter for screening mammogram for malignant neoplasm of breast: Secondary | ICD-10-CM | POA: Diagnosis present

## 2022-05-10 DIAGNOSIS — Z170 Estrogen receptor positive status [ER+]: Secondary | ICD-10-CM

## 2022-05-10 LAB — MISC LABCORP TEST (SEND OUT): Labcorp test code: 81950

## 2022-05-16 ENCOUNTER — Inpatient Hospital Stay: Payer: BC Managed Care – PPO | Admitting: Hematology

## 2022-05-16 VITALS — BP 173/88 | HR 95 | Temp 97.0°F | Resp 16 | Wt 178.1 lb

## 2022-05-16 DIAGNOSIS — Z170 Estrogen receptor positive status [ER+]: Secondary | ICD-10-CM

## 2022-05-16 DIAGNOSIS — C50512 Malignant neoplasm of lower-outer quadrant of left female breast: Secondary | ICD-10-CM

## 2022-05-16 MED ORDER — ALENDRONATE SODIUM 70 MG PO TABS: 70.0000 mg | ORAL_TABLET | ORAL | 3 refills | Status: DC

## 2022-05-16 NOTE — Patient Instructions (Addendum)
[place] at [facility] Discharge Instructions   You were seen and examined today by Dr. Delton Coombes.  He reviewed the results of your lab work which are normal/stable.   He reviewed the results of your bone density test. It shows worsening weakening of the bones. We discussed starting you on Fosamax for this. You will take once a week. You must take this with a tall glass of water and remain upright for at least 30 minutes after taking.   We will see you back in one year. We will repeat a mammogram and lab work prior to your next visit.    Thank you for choosing [place] at [facility] to provide your oncology and hematology care.  To afford each patient quality time with our provider, please arrive at least 15 minutes before your scheduled appointment time.   If you have a lab appointment with the [facility] please come in thru the Main Entrance and check in at the main information desk.  You need to re-schedule your appointment should you arrive 10 or more minutes late.  We strive to give you quality time with our providers, and arriving late affects you and other patients whose appointments are after yours.  Also, if you no show three or more times for appointments you may be dismissed from the clinic at the providers discretion.     Again, thank you for choosing [facility].  Our hope is that these requests will decrease the amount of time that you wait before being seen by our physicians.       _____________________________________________________________  Should you have questions after your visit to [facility], please contact our office at [phone] and follow the prompts.  Our office hours are 8:00 a.m. and 4:30 p.m. Monday - Friday.  Please note that voicemails left after 4:00 p.m. may not be returned until the following business day.  We are closed weekends and major holidays.  You do have access to a  nurse 24-7, just call the main number to the clinic [phone] and do not press any options, hold on the line and a nurse will answer the phone.    For prescription refill requests, have your pharmacy contact our office and allow 72 hours.    Due to Covid, you will need to wear a mask upon entering the hospital. If you do not have a mask, a mask will be given to you at the Main Entrance upon arrival. For doctor visits, patients may have 1 support person age 64 or older with them. For treatment visits, patients can not have anyone with them due to social distancing guidelines and our immunocompromised population.

## 2022-05-16 NOTE — Progress Notes (Signed)
[facility] [address], [place] [address]   Patient Care Team: [name], MD as PCP - General (Family Medicine) Gala Romney, Cristopher Estimable, MD (Gastroenterology) Aviva Signs, MD as Consulting Physician (General Surgery) Florian Buff, MD as Consulting Physician (Obstetrics and Gynecology)  SUMMARY OF ONCOLOGIC HISTORY: Oncology History  Breast cancer of lower-outer quadrant of left female breast ([facility] [facility])  05/24/2014 Initial Diagnosis   Breast cancer of lower-outer quadrant of left female breast   06/22/2014 - 08/05/2014 Radiation Therapy   Left breast 50 Gy at 2 Gy per fraction x 25 fractions with left breast boost 10 Gy at 2 Gy per fraction x 5 fractions by Dr. Pablo Ledger.     CHIEF COMPLIANT: Follow-up for left breast cancer   INTERVAL HISTORY: Beth Hamilton is a 66 y.o. female here for follow-up of left breast cancer.  She is tolerating anastrozole very well.  Reports energy levels of 100%.  No new onset pains.  REVIEW OF SYSTEMS:   Review of Systems  Constitutional:  Negative for appetite change and fatigue.  Endocrine: Negative for hot flashes.  Musculoskeletal:  Negative for arthralgias.  All other systems reviewed and are negative.   I have reviewed the past medical history, past surgical history, social history and family history with the patient and they are unchanged from previous note.   ALLERGIES:   is allergic to flagyl [metronidazole], other, and levaquin [levofloxacin in d5w].   MEDICATIONS:  Current Outpatient Medications  Medication Sig Dispense Refill   allopurinol (ZYLOPRIM) 300 MG tablet Take 1 tablet (300 mg total) by mouth daily. 90 tablet 3   amLODipine (NORVASC) 5 MG tablet Take 1 tablet (5 mg total) by mouth daily. 90 tablet 1   anastrozole (ARIMIDEX) 1 MG tablet TAKE 1 TABLET BY MOUTH EVERY DAY 90 tablet 3   ARMOUR THYROID 60 MG tablet Take 60 mg by mouth daily.      B Complex Vitamins (VITAMIN-B COMPLEX PO) Take 1 mL by  mouth daily. Vit b12     balsalazide (COLAZAL) 750 MG capsule TAKE 3 CAPSULES (2,250 MG TOTAL) BY MOUTH 3 (THREE) TIMES DAILY 810 capsule 1   Calcium Citrate-Vitamin D (CITRACAL + D PO) Take by mouth.     fluticasone (CUTIVATE) 0.05 % cream APPLY TO AFFECTED AREAS TWICE DAILY. (Patient taking differently: as needed. APPLY TO AFFECTED AREAS TWICE DAILY.) 60 g 1   folic acid (FOLVITE) 407 MCG tablet Take 400 mcg by mouth daily.      HUMIRA PEN 40 MG/0.4ML PNKT INJECT 1 PEN UNDER THE SKIN EVERY 7 DAYS. 4 each 5   lisinopril-hydrochlorothiazide (ZESTORETIC) 20-25 MG tablet Take 1 tablet by mouth daily. 90 tablet 3   No current facility-administered medications for this visit.     PHYSICAL EXAMINATION: Performance status (ECOG): 0 - Asymptomatic  Vitals:   05/16/22 0807  BP: (!) 173/88  Pulse: 95  Resp: 16  Temp: (!) 97 F (36.1 C)  SpO2: 100%   Wt Readings from Last 3 Encounters:  05/16/22 178 lb 2.1 oz (80.8 kg)  05/09/22 180 lb (81.6 kg)  02/26/22 178 lb 6.4 oz (80.9 kg)   Physical Exam Vitals reviewed.  Constitutional:      Appearance: Normal appearance.  Cardiovascular:     Rate and Rhythm: Normal rate and regular rhythm.     Pulses: Normal pulses.     Heart sounds: Normal heart sounds.  Pulmonary:     Effort: Pulmonary effort  is normal.     Breath sounds: Normal breath sounds.  Chest:  Breasts:    Right: Normal. No inverted nipple, mass, nipple discharge, skin change or tenderness.     Left: Normal. No inverted nipple, mass, nipple discharge, skin change (medial quadrant lumpectomy scar within normal limits) or tenderness.  Abdominal:     Palpations: Abdomen is soft. There is no hepatomegaly, splenomegaly or mass.     Tenderness: There is no abdominal tenderness.  Lymphadenopathy:     Upper Body:     Right upper body: No supraclavicular, axillary or pectoral adenopathy.     Left upper body: No supraclavicular, axillary or pectoral adenopathy.  Neurological:      General: No focal deficit present.     Mental Status: She is alert and oriented to person, place, and time.  Psychiatric:        Mood and Affect: Mood normal.        Behavior: Behavior normal.    Breast Exam Chaperone: Thana Ates     LABORATORY DATA:  I have reviewed the data as listed    Latest Ref Rng & Units 05/09/2022    8:58 AM 01/21/2022    9:57 AM 10/12/2021   10:33 AM  CMP  Glucose 70 - 99 mg/dL 154  131  117   BUN 8 - 23 mg/dL 14  13  13    Creatinine 0.44 - 1.00 mg/dL 0.61  0.80  0.82   Sodium 135 - 145 mmol/L 139  141  139   Potassium 3.5 - 5.1 mmol/L 4.2  4.2  4.4   Chloride 98 - 111 mmol/L 104  102  99   CO2 22 - 32 mmol/L 27  24  25    Calcium 8.9 - 10.3 mg/dL 9.1  9.8  9.7   Total Protein 6.5 - 8.1 g/dL 6.9   6.9   Total Bilirubin 0.3 - 1.2 mg/dL 0.9   0.6   Alkaline Phos 38 - 126 U/L 72   93   AST 15 - 41 U/L 19   19   ALT 0 - 44 U/L 16   13    No results found for: "CAN153" Lab Results  Component Value Date   WBC 7.5 05/09/2022   HGB 13.7 05/09/2022   HCT 41.7 05/09/2022   MCV 104.8 (H) 05/09/2022   PLT 245 05/09/2022   NEUTROABS 4.6 05/09/2022    ASSESSMENT:  1.  Stage I (T1BN0) left breast cancer: -Status post left lumpectomy on 04/20/2014, 0.9 cm IDC, grade 1, ER/PR positive, HER-2 negative, Ki-67 of 10%. -Oncotype DX score of 7, underwent radiation therapy, started on anastrozole. -Patient opted to continue anastrozole beyond 5 years.   2.  Osteoporosis: - DEXA scan in 2015 showed osteoporosis. - Prolia from 12/30/2014 through 01/11/2021, self discontinued due to high cost. -DEXA scan in December 2017 showed T score of -2.4. -Repeat DEXA scan on 04/27/2019 showed T score of -2.1. - DEXA scan on 05/09/2022 with T score -3.0 - Fosamax 70 mg weekly started on 05/16/2022   PLAN:  1.  Stage I (T1BN0) left breast cancer: - She is tolerating anastrozole very well. - Physical examination today did not reveal any palpable masses.  Left breast medial  quadrant lumpectomy scar is within normal limits with no palpable adenopathy. - Labs from 05/09/2022 shows normal LFTs and creatinine.  CBC was grossly normal with stable macrocytosis over several years.  Previous TSH was normal. - She will continue anastrozole until  end of 2025. - Mammogram on 05/10/2022 was BI-RADS Category 1. - RTC 1 year for follow-up with repeat mammogram and labs.   2.  Osteoporosis: - She received Prolia from 12/2014 through 01/11/2021.  Self discontinued due to higher cost. - Previous vitamin D levels were normal. - DEXA scan from 05/09/2022 shows T score -3.0, worse from previous scan. - We talked about initiating her on oral bisphosphonates. - We discussed about side effects in detail including rare chance of esophageal/gastric ulcer and ONJ.  She was told to administer first thing in the morning with a tall glass of water and stay upright for more than an hour and until after the first food of the day. - She was also advised to take calcium supplements.  We will plan to repeat DEXA scan in a couple of years.  Breast Cancer therapy associated bone loss: I have recommended calcium, Vitamin D and weight bearing exercises.  Orders placed this encounter:  Orders Placed This Encounter  Procedures   CBC with Differential   Comprehensive metabolic panel   VITAMIN D 25 Hydroxy (Vit-D Deficiency, Fractures)     The patient has a good understanding of the overall plan. She agrees with it. She will call with any problems that may develop before the next visit here.  Derek Jack, MD [place] [phone]

## 2022-05-17 ENCOUNTER — Ambulatory Visit (INDEPENDENT_AMBULATORY_CARE_PROVIDER_SITE_OTHER): Payer: BC Managed Care – PPO | Admitting: Adult Health

## 2022-05-17 ENCOUNTER — Encounter: Payer: Self-pay | Admitting: Adult Health

## 2022-05-17 VITALS — BP 145/91 | HR 84 | Ht 64.0 in | Wt 176.0 lb

## 2022-05-17 DIAGNOSIS — Z01419 Encounter for gynecological examination (general) (routine) without abnormal findings: Secondary | ICD-10-CM | POA: Diagnosis not present

## 2022-05-17 NOTE — Progress Notes (Addendum)
Patient ID: Beth Hamilton, female   DOB: March 26, 1956, 66 y.o.   MRN: [id number] History of Present Illness: Beth Hamilton is a 66 year old white female, separated,, PM in for a well woman gyn exam.  Last pap was negative HPV and malignancy 05/11/21.  PCP is Dr Wolfgang Phoenix  Current Medications, Allergies, Past Medical History, Past Surgical History, Family History and Social History were reviewed in [facility] record.     Review of Systems: Patient denies any headaches, hearing loss, fatigue, blurred vision, shortness of breath, chest pain, abdominal pain, problems with bowel movements, urination, or intercourse(not active). No joint pain or mood swings.     Physical Exam:BP (!) 145/91 (BP Location: Left Arm, Patient Position: Sitting, Cuff Size: Normal)   Pulse 84   Ht 5' 4"  (1.626 m)   Wt 176 lb (79.8 kg)   BMI 30.21 kg/m   General:  Well developed, well nourished, no acute distress Skin:  Warm and dry Neck:  Midline trachea, normal thyroid, good ROM, no lymphadenopathy,no carotid bruits heard Lungs; Clear to auscultation bilaterally Breast:  No dominant palpable mass, retraction, or nipple discharge, well healed scar left breast Cardiovascular: Regular rate and rhythm Abdomen:  Soft, non tender, no hepatosplenomegaly Pelvic:  External genitalia is normal in appearance, no lesions.  The vagina is pale and atrophic. Urethra has no lesions or masses. The cervix is smooth.  Uterus is felt to be normal size, shape, and contour.  No adnexal masses or tenderness noted.Bladder is non tender, no masses felt. Rectal: Deferred Extremities/musculoskeletal:  No swelling or varicosities noted, no clubbing or cyanosis Psych:  No mood changes, alert and cooperative,seems happy AA is 4 Fall risk is low    05/17/2022    8:39 AM 05/09/2022    1:55 PM 05/11/2021    8:44 AM  Depression screen PHQ 2/9  Decreased Interest 0 0 0  Down, Depressed, Hopeless 0 0 0  PHQ - 2 Score 0 0 0   Altered sleeping 0  0  Tired, decreased energy 0  1  Change in appetite 0  0  Feeling bad or failure about yourself  0  0  Trouble concentrating 0  0  Moving slowly or fidgety/restless 0  0  Suicidal thoughts 0  0  PHQ-9 Score 0  1       05/17/2022    8:39 AM 05/11/2021    8:44 AM 05/05/2020    8:41 AM  GAD 7 : Generalized Anxiety Score  Nervous, Anxious, on Edge 0 0 0  Control/stop worrying 0 0 0  Worry too much - different things 0 0 0  Trouble relaxing 0 0 0  Restless 0 0 0  Easily annoyed or irritable 0 0 0  Afraid - awful might happen 0 0 0  Total GAD 7 Score 0 0 0      Upstream - 05/17/22 5993       Pregnancy Intention Screening   Does the patient want to become pregnant in the next year? No    Does the patient's partner want to become pregnant in the next year? No    Would the patient like to discuss contraceptive options today? No      Contraception Wrap Up   Current Method Abstinence;Female Sterilization    End Method Abstinence;Female Sterilization    Contraception Counseling Provided No             Examination chaperoned by Levy Pupa LPN  Impression and Plan:  1. Encounter for well woman exam with routine gynecological exam Physical in 1 year Pap 2025 Labs with PCP, she saw Dr Wolfgang Phoenix 05/09/22 and will start on Fosama for osteoporosis, she stopped Prolia due to cost Mammogram was negative 05/10/22 and saw Dr Delton Coombes yesterday, has history or breast cancer  Colonoscopy per GI, sees Dr Gala Romney in January  Stay active, still working

## 2022-05-21 LAB — HM DIABETES EYE EXAM

## 2022-06-10 ENCOUNTER — Telehealth: Payer: Self-pay

## 2022-06-10 NOTE — Telephone Encounter (Signed)
Pt has an appt on 08/06/2022 with you but was requesting something to be done before then. Advised pt to make an appt to be seen sooner regarding this flare up but declined requesting that I route a message to you instead to see if you could help her without her coming in to the office. Please advise.

## 2022-06-10 NOTE — Telephone Encounter (Signed)
Pt was scheduled with Magda Paganini on 06/18/22 at 11am.

## 2022-06-10 NOTE — Telephone Encounter (Signed)
Pt called stating that she is having a flare up of her ulcerative colitis. Pt states that she is having watery diarrhea that is cloudy. Pt states that she has a fever of 99.3 as well. Pt is currently taking balsalazide 3 times a day as well as humira weekly. Pt has an appt on

## 2022-06-18 ENCOUNTER — Ambulatory Visit: Payer: BC Managed Care – PPO | Admitting: Gastroenterology

## 2022-06-18 ENCOUNTER — Encounter: Payer: Self-pay | Admitting: Gastroenterology

## 2022-06-18 VITALS — BP 134/81 | HR 93 | Temp 98.1°F | Ht 64.0 in | Wt 176.6 lb

## 2022-06-18 DIAGNOSIS — K529 Noninfective gastroenteritis and colitis, unspecified: Secondary | ICD-10-CM | POA: Diagnosis not present

## 2022-06-18 MED ORDER — PREDNISONE 10 MG PO TABS: ORAL_TABLET | ORAL | 0 refills | Status: DC

## 2022-06-18 NOTE — Patient Instructions (Addendum)
Please complete labs Friday. We will be in touch with results next week.  Start prednisone 77m daily for 5 days. Then drop down to 149mdaily for 7 days, then 66m49maily until complete prescription.  Continue Humira and balsalazide as before.  Keep 07/2022 appointment with Dr. RouGala Romney

## 2022-06-18 NOTE — Progress Notes (Addendum)
GI Office Note    Referring Provider: [name], MD Primary Care Physician:  [name], MD  Primary Gastroenterologist: Garfield Cornea, MD   Chief Complaint   Chief Complaint  Patient presents with   Ulcerative Colitis    Flare up.    History of Present Illness   Beth Hamilton is a 66 y.o. female presenting today for urgent visit.  Seen in August 2023 for indeterminate colitis (likely variant of ulcerative colitis).  She has been on Humira weekly (since April 2023) due to low drug level and positive antibody level. Also has been on balsalazide 3 capsules 3 times daily.  At last office visit advised to decrease balsalazide to 3 capsules twice daily for 1 month and then stop.  Patient called in several weeks later with complaints of flare, after coming off balsalazide.  She restarted therapy and was also given 2-week course of prednisone 20 mg daily.  Did well with this management up until 1 to 2 weeks ago when she called with another flare.  Patient states that she never really felt like Humira had her completely under control. And has had more flares this year than usual.  Currently having urgency.  Passing a lot of mucus and watery stools.  No blood per rectum.  About 4 stools per day.  Has abdominal pain just prior to bowel movements but then resolves.  She is having low-grade fever at the onset of this current flare.  She had 3 leftover prednisone was at home and she took 10 mg daily for 3 days which has helped some.  Denies any recent antibiotics or ill contacts.   Colonoscopy 2021: noted to have ulcerated and eroded colonic mucosa with most severe changes in the left colon specifically the sigmoid segment. Relative sparing of the rectum and descending segment, normal TI. She also had pseudopolyp formation. Biopsies from the ascending colon, transverse colon were unremarkable. She had mild active colitis involving the descending colon and rectum on biopsy. Chronically  severe active colitis noted on sigmoid colon biopsies. CMV was negative.    Medications   Current Outpatient Medications  Medication Sig Dispense Refill   alendronate (FOSAMAX) 70 MG tablet Take 1 tablet (70 mg total) by mouth once a week. Take with a full glass of water on an empty stomach. 52 tablet 3   allopurinol (ZYLOPRIM) 300 MG tablet Take 1 tablet (300 mg total) by mouth daily. 90 tablet 3   amLODipine (NORVASC) 5 MG tablet Take 1 tablet (5 mg total) by mouth daily. 90 tablet 1   anastrozole (ARIMIDEX) 1 MG tablet TAKE 1 TABLET BY MOUTH EVERY DAY 90 tablet 3   ARMOUR THYROID 60 MG tablet Take 60 mg by mouth daily.      B Complex Vitamins (VITAMIN-B COMPLEX PO) Take 1 mL by mouth daily. Vit b12     balsalazide (COLAZAL) 750 MG capsule TAKE 3 CAPSULES (2,250 MG TOTAL) BY MOUTH 3 (THREE) TIMES DAILY 810 capsule 1   Calcium Citrate-Vitamin D (CITRACAL + D PO) Take by mouth.     fluticasone (CUTIVATE) 0.05 % cream APPLY TO AFFECTED AREAS TWICE DAILY. (Patient taking differently: as needed. APPLY TO AFFECTED AREAS TWICE DAILY.) 60 g 1   folic acid (FOLVITE) 253 MCG tablet Take 400 mcg by mouth daily.      HUMIRA PEN 40 MG/0.4ML PNKT INJECT 1 PEN UNDER THE SKIN EVERY 7 DAYS. 4 each 5   lisinopril-hydrochlorothiazide (ZESTORETIC) 20-25 MG  tablet Take 1 tablet by mouth daily. 90 tablet 3   No current facility-administered medications for this visit.    Allergies   Allergies as of 06/18/2022 - Review Complete 06/18/2022  Allergen Reaction Noted   Flagyl [metronidazole]  10/29/2013   Other Other (See Comments) 03/22/2014   Levaquin [levofloxacin in d5w]  07/23/2013         Review of Systems   General: Negative for anorexia, weight loss, fever, chills, fatigue, weakness. ENT: Negative for hoarseness, difficulty swallowing , nasal congestion. CV: Negative for chest pain, angina, palpitations, dyspnea on exertion, peripheral edema.  Respiratory: Negative for dyspnea at rest, dyspnea  on exertion, cough, sputum, wheezing.  GI: See history of present illness. GU:  Negative for dysuria, hematuria, urinary incontinence, urinary frequency, nocturnal urination.  Endo: Negative for unusual weight change.     Physical Exam   BP 134/81 (BP Location: Right Arm, Patient Position: Sitting, Cuff Size: Large)   Pulse 93   Temp 98.1 F (36.7 C) (Oral)   Ht 5' 4"  (1.626 m)   Wt 176 lb 9.6 oz (80.1 kg)   SpO2 95%   BMI 30.31 kg/m    General: Well-nourished, well-developed in no acute distress.  Eyes: No icterus.   Abdomen: Bowel sounds are normal, nontender, nondistended, no hepatosplenomegaly or masses,  no abdominal bruits or hernia , no rebound or guarding.  Rectal: not performed Extremities: No lower extremity edema. No clubbing or deformities. Neuro: Alert and oriented x 4   Skin: Warm and dry, no jaundice.   Psych: Alert and cooperative, normal mood and affect.  Labs   Lab Results  Component Value Date   HGBA1C 6.3 (H) 02/26/2022   Lab Results  Component Value Date   CREATININE 0.61 05/09/2022   BUN 14 05/09/2022   NA 139 05/09/2022   K 4.2 05/09/2022   CL 104 05/09/2022   CO2 27 05/09/2022   Lab Results  Component Value Date   ALT 16 05/09/2022   AST 19 05/09/2022   ALKPHOS 72 05/09/2022   BILITOT 0.9 05/09/2022   Lab Results  Component Value Date   WBC 7.5 05/09/2022   HGB 13.7 05/09/2022   HCT 41.7 05/09/2022   MCV 104.8 (H) 05/09/2022   PLT 245 05/09/2022   Lab Results  Component Value Date   TSH 1.690 01/21/2022   No results found for: "IRON", "TIBC", "FERRITIN" Lab Results  Component Value Date   VITAMINB12 1,158 10/12/2021   Lab Results  Component Value Date   FOLATE >20.0 10/12/2021    Imaging Studies   No results found.  Assessment   Indeterminate colitis, likely variant ulcerative colitis: Presents with recurrent flare.  States she really never felt like she obtained clinical remission on Humira.  Went to weekly dosing  in April because of low drug level and low antibody level.  We will update levels now \in order to determine best course for future medical management.   PLAN   She will go for labs on Friday to obtain adalimumab level (trough) and antibody level.  Prednisone 20 mg daily for 5 days.  Then drop to 10 mg daily for 7 days.  Then drop to 5 mg daily until prescription completed. Continue Humira and balsalazide as before. Decisions made for change in therapy to follow labs. Keep appointment in January with Dr. Gala Romney.  She is due for surveillance colonoscopy.  Prefers small-volume prep.   Laureen Ochs. Bobby Rumpf, [place], [facility]

## 2022-06-28 LAB — C-REACTIVE PROTEIN: CRP: 6 mg/L (ref 0–10)

## 2022-06-28 LAB — ADALIMUMAB+AB (SERIAL MONITOR)
Adalimumab Drug Level: 6.1 ug/mL
Anti-Adalimumab Antibody: 62 ng/mL

## 2022-06-28 LAB — SEDIMENTATION RATE: Sed Rate: 10 mm/hr (ref 0–40)

## 2022-07-02 ENCOUNTER — Telehealth: Payer: Self-pay

## 2022-07-02 NOTE — Telephone Encounter (Signed)
Pt called wanting to know the results to her recent labs. Pt is also wanting to know what the next steps will be. Please advise.

## 2022-07-03 NOTE — Telephone Encounter (Signed)
Please let her know that her inflammatory markers were normal, I suspect they could have been elevated but prednisone would have helped lower.   Her humira drug level is slightly lower than we would like to see it and she has slightly increased antibody levels from last time.   I am waiting to hear further input about potential drug change from Dr. Gala Romney. Hopefully more information in the next 1-2 days.

## 2022-07-03 NOTE — Telephone Encounter (Signed)
Pt was made aware and states that she will be looking forward to hearing from someone regarding potential drug change.

## 2022-07-05 NOTE — Telephone Encounter (Signed)
Spoke to Dr. Gala Romney. Spoke to patient.   Patient has two or three more Humira doses. She will continue until complete.   We need to start approval process for [facility] for UC.

## 2022-07-08 ENCOUNTER — Encounter: Payer: Self-pay | Admitting: *Deleted

## 2022-07-08 NOTE — Telephone Encounter (Signed)
Pt is stating that after talking to you on Friday she read up on the entyvio and states that it has a lot of side effects and she is requesting to speak to you regarding this as well as regarding what medications will be taken with it.

## 2022-07-11 NOTE — Telephone Encounter (Signed)
Had refilled Humira while waiting on starting new medication. Finishing up prednisone taper and still on her balsalzide.   She read up on Entyvio and is not wanting to try this. She is concerned about the side effects. She is wanting to avoid medication that requires an infusion induction dose.   She is wanting to consider any options of medication that can be given along with Humira to take the place of balsalazide. Her drug level is slightly lower than would prefer, it is 6.1 with goal above 7.5. Her antibody level slightly higher but still "low titer". The biggest issue is that she thought the medication wasn't really helping as much as it should.   She is now interested in continuing Humira and rechecking drug/ab levels later.  She is wondering if she can have different medication than balsalazide to go with the Humira.   Currently she has follow up with Dr. Gala Romney in January. Will see if he has any additional options. She is aware that Dr. Gala Romney is out until 12/29 and may not hear back before first of the year.

## 2022-07-12 NOTE — Telephone Encounter (Signed)
Communication noted.  

## 2022-07-15 ENCOUNTER — Other Ambulatory Visit: Payer: Self-pay | Admitting: Gastroenterology

## 2022-07-17 NOTE — Telephone Encounter (Signed)
Noted  

## 2022-07-18 ENCOUNTER — Telehealth: Payer: Self-pay

## 2022-07-18 DIAGNOSIS — Z79899 Other long term (current) drug therapy: Secondary | ICD-10-CM

## 2022-07-18 DIAGNOSIS — K529 Noninfective gastroenteritis and colitis, unspecified: Secondary | ICD-10-CM

## 2022-07-18 NOTE — Telephone Encounter (Signed)
Pt called and left a vm requesting that you contact her regarding the Entyvio. Pt states that you are talked about it before Christmas and that she would like to talk to you on the phone.

## 2022-07-19 MED ORDER — PREDNISONE 10 MG PO TABS: ORAL_TABLET | ORAL | 0 refills | Status: DC

## 2022-07-19 MED ORDER — AZATHIOPRINE 50 MG PO TABS: 25.0000 mg | ORAL_TABLET | Freq: Every day | ORAL | 3 refills | Status: DC

## 2022-07-19 NOTE — Addendum Note (Signed)
Addended by: Mahala Menghini on: 07/19/2022 03:29 PM   Modules accepted: Orders

## 2022-07-19 NOTE — Telephone Encounter (Signed)
Spoke to patient:  -BM 8-10 times per day. At least nocturnal diarrhea twice each night. A lot of tenesmus. Urgency. Stools watery. Mucous. Some stools mixed in.  -Some low grade fever which is typical of her flare.  -No abdominal pain. -currently off prednisone. Does not feel like recent flare cleared her up this time.   After discussion with Dr. Gala Romney electronically. We will add immunomodulator, Imuran. Patient is agreeable. We discuss potential risk of lymphoma, leukopenia, thrombocytopenia.  -->continue Humira 54m [place] weekly -->prednisone 420mdaily with taper -->stop balsalazide once imuran started (due to drug interaction risk) -->we will keep imuran dosage at reduced dose due to concomitant allopurinol. Start 2540maily. Will monitor for toxicity regularly. -->start imuran 44m74mily -->she will go to [facility] in one week for CBC, LFTs, TMPT enzyme -->she will keep us u[place]ated on symptoms and keep 07/2022 appt with Dr. RourGala RomneyFYI Dr. RourGala RomneyYI Tammy

## 2022-07-19 NOTE — Telephone Encounter (Signed)
Pt called back following up on the previous message. Pt states that she will need an Rx for prednisone sent in and states that she wants to discuss the [facility] further because she is at the point where she may need to try it. She states that she is starting to have issues and she doesn't want to end up in the hospital.

## 2022-07-23 NOTE — Telephone Encounter (Signed)
Communication noted.  

## 2022-07-29 NOTE — Telephone Encounter (Signed)
Please remind patient to have her labs done. She should have completed 7 days after started her imuran (azathioprine).

## 2022-08-06 ENCOUNTER — Encounter: Payer: Self-pay | Admitting: Internal Medicine

## 2022-08-06 ENCOUNTER — Ambulatory Visit: Payer: BC Managed Care – PPO | Admitting: Internal Medicine

## 2022-08-06 VITALS — BP 127/84 | HR 91 | Temp 98.1°F | Ht 64.0 in | Wt 179.2 lb

## 2022-08-06 DIAGNOSIS — K529 Noninfective gastroenteritis and colitis, unspecified: Secondary | ICD-10-CM | POA: Diagnosis not present

## 2022-08-06 DIAGNOSIS — R197 Diarrhea, unspecified: Secondary | ICD-10-CM

## 2022-08-06 DIAGNOSIS — Z79899 Other long term (current) drug therapy: Secondary | ICD-10-CM

## 2022-08-06 NOTE — Patient Instructions (Signed)
It was good to see you again today exclamation mark  Glad you are feeling better  Continue to taper down on the prednisone until you come completely off of it  Continue azathioprine at a dose of 25 mg daily for now.  Continue Humira 40 mg weekly.  We discussed the increased risk of skin cancers with this medication.  Protect yourself with limiting sun exposure and using sunscreen.  At your annual physical examination, have a good skin examination  Slightly increased risk of non-Hodgkin's lymphoma (3 out of 10,000 risk).  This is very low.  Medication has potential effect to alter your basic blood work including hemoglobin, white blood cell count and liver enzymes.  Periodic blood work will be needed.  As discussed, we will be best to hold off your colonoscopy until later in the year how things "settle out" with your new medication regimen.  Once I get the special blood work back regarding azathioprine metabolism I will be back in touch with you.  Office visit here in 3 months  Repeat CBC and hepatic function profile in 2 weeks

## 2022-08-06 NOTE — Progress Notes (Signed)
Primary Care Physician:  [name], MD Primary Gastroenterologist:  Dr.   Pre-Procedure History & Physical: HPI:  Beth Hamilton is a 67 y.o. female here for  for follow well-documented history of indeterminate/ulcerative colitis.  Had breakthrough symptoms on Humira last year.  Dosing increased to once weekly.  She had a lower limit of normal blood level and a notable antibody level recently. We consider to pivot to [facility].  Patient read about Weyman Rodney and is not interested in that medication.   We mutually agreed to add on immunomodulators in the way of azathioprine.  Currently on 25 mg daily.  She also takes allopurinol.  Conservative dosing because of coadministration of allopurinol.    Doing very well without abdominal pain tenesmus, diarrhea or rectal bleeding.T diarrhea or rectal bleeding. TMPT metabolite assay is pending.  In the interim to bridge, she has been on a prednisone taper down to 20 mg daily   At this time;no longer taking balsalazide.   Past Medical History:  Diagnosis Date   Breast cancer ([place]) 04/20/14   left breast   Complication of anesthesia    not sure what med was given in 96 with D&C, was very sore all over. Dr Patsey Berthold did anesthesia. Dr Heide Spark did surgery   Gout    Hemorrhoids    History of breast cancer 05/19/2015   Hypertension    Hypothyroidism    IBD (inflammatory bowel disease)    UC   Personal history of radiation therapy    2015   Pneumonia    Psoriasis    Thyroid disease    Ulcerative colitis ([place]) 5 1 2007   Dr Gala Romney    Past Surgical History:  Procedure Laterality Date   BIOPSY  08/02/2019   Procedure: BIOPSY;  Surgeon: Daneil Dolin, MD;  [facility];  Service: Endoscopy;;  colon   BREAST BIOPSY     BREAST LUMPECTOMY Left 04/20/2014   COLONOSCOPY  10/09/2001   RMR: Internal hemorrhoids and anal papilla; otherwise normal rectum   COLONOSCOPY  12/10/2005   incomplete tcs-  colitis   COLONOSCOPY N/A 04/18/2014    Dr.Rashad Auld- abnormal rectum & L colon into the mid descending segment. mucosa was erythematious as well as pale w/ some loss of normal vascular apttern. no erosions or ulcers. pt had some smooth peduculated 3-4cm polyps in the sigmoid segment most c/w pseudopolyps. the rest of the colonic mucosa appeared normal. bx= inflammatory polyps   COLONOSCOPY WITH PROPOFOL N/A 08/02/2019   Procedure: COLONOSCOPY WITH PROPOFOL;  Surgeon: Daneil Dolin, MD;  [facility];  Service: Endoscopy;  Laterality: N/A;  8:30am   DILATION AND CURETTAGE OF UTERUS  1996   Dr Zigmund Daniel SIGMOIDOSCOPY  02/26/2006   endoscopically normal-appearing rectum, colitis in sigmoid mucosa   PARTIAL MASTECTOMY WITH NEEDLE LOCALIZATION AND AXILLARY SENTINEL LYMPH NODE BX Left 04/20/2014   Procedure: PARTIAL MASTECTOMY AFTER NEEDLE LOCALIZATION AND AXILLARY SENTINEL LYMPH NODE BX;  Surgeon: Jamesetta So, MD;  Location: [facility];  Service: General;  Laterality: Left;   TUBAL LIGATION     WISDOM TOOTH EXTRACTION      Prior to Admission medications   Medication Sig Start Date End Date Taking? Authorizing Provider  alendronate (FOSAMAX) 70 MG tablet Take 1 tablet (70 mg total) by mouth once a week. Take with a full glass of water on an empty stomach. 05/16/22  Yes Derek Jack, MD  allopurinol (ZYLOPRIM) 300 MG tablet  Take 1 tablet (300 mg total) by mouth daily. 10/19/21  Yes Luking, Elayne Snare, MD  amLODipine (NORVASC) 5 MG tablet Take 1 tablet (5 mg total) by mouth daily. 05/09/22  Yes [name], MD  anastrozole (ARIMIDEX) 1 MG tablet TAKE 1 TABLET BY MOUTH EVERY DAY 07/06/21  Yes Derek Jack, MD  ARMOUR THYROID 60 MG tablet Take 60 mg by mouth daily.  11/16/18  Yes [provider]  azaTHIOprine (IMURAN) 50 MG tablet Take 0.5 tablets (25 mg total) by mouth daily. 07/19/22  Yes Mahala Menghini, PA-C  B Complex Vitamins (VITAMIN-B COMPLEX PO) Take 1 mL by mouth daily. Vit b12   Yes [provider]  Calcium Citrate-Vitamin D (CITRACAL + D PO) Take by mouth.   Yes [provider]  fluticasone (CUTIVATE) 0.05 % cream APPLY TO AFFECTED AREAS TWICE DAILY. Patient taking differently: as needed. APPLY TO AFFECTED AREAS TWICE DAILY. 12/18/18  Yes [name], MD  folic acid (FOLVITE) 789 MCG tablet Take 400 mcg by mouth daily.    Yes [provider]  HUMIRA PEN 40 MG/0.4ML PNKT INJECT 1 PEN UNDER THE SKIN EVERY 7 DAYS. 05/01/22  Yes Mahala Menghini, PA-C  lisinopril-hydrochlorothiazide (ZESTORETIC) 20-25 MG tablet Take 1 tablet by mouth daily. 10/19/21  Yes [name], MD  predniSONE (DELTASONE) 10 MG tablet Take '40mg'$  orally daily for 7 days, then '30mg'$  orally daily for 7 days, then '20mg'$  orally daily for 7 day, then '10mg'$  daily for 7 days, then '5mg'$  daily for 7 days. 07/19/22  Yes Mahala Menghini, PA-C    Allergies as of 08/06/2022 - Review Complete 08/06/2022  Allergen Reaction Noted   Flagyl [metronidazole]  10/29/2013   Other Other (See Comments) 03/22/2014   Levaquin [levofloxacin in d5w]  07/23/2013    Family History  Problem Relation Age of Onset   Stroke Maternal Grandfather    Heart attack Maternal Grandfather    Hypertension Mother    Diabetes Paternal Grandfather    Heart disease Paternal Grandfather    Colon cancer Neg Hx     Social History   Socioeconomic History   Marital status: Legally Separated    Spouse name: Not on file   Number of children: 1   Years of education: Not on file   Highest education level: Not on file  Occupational History   Not on file  Tobacco Use   Smoking status: Former    Packs/day: 0.00    Years: 15.00    Total pack years: 0.00    Types: Cigarettes    Quit date: 07/26/2005    Years since quitting: 17.0   Smokeless tobacco: Never   Tobacco comments:    quit in 2007  Vaping Use   Vaping Use: Never used  Substance and Sexual Activity   Alcohol use: Yes    Alcohol/week: 14.0 standard drinks of  alcohol    Types: 14 Glasses of wine per week    Comment: wine every day   Drug use: No   Sexual activity: Not Currently    Birth control/protection: Post-menopausal, Abstinence, Surgical    Comment: tubal  Other Topics Concern   Not on file  Social History Narrative   Not on file   Social Determinants of Health   Financial Resource Strain: Low Risk  (05/17/2022)   Overall Financial Resource Strain (CARDIA)    Difficulty of Paying Living Expenses: Not hard at all  Food Insecurity: No Food Insecurity (05/17/2022)  Hunger Vital Sign    Worried About Running Out of Food in the Last Year: Never true    Ran Out of Food in the Last Year: Never true  Transportation Needs: No Transportation Needs (05/17/2022)   PRAPARE - Hydrologist (Medical): No    Lack of Transportation (Non-Medical): No  Physical Activity: Insufficiently Active (05/17/2022)   Exercise Vital Sign    Days of Exercise per Week: 3 days    Minutes of Exercise per Session: 10 min  Stress: No Stress Concern Present (05/17/2022)   [place]    Feeling of Stress : Not at all  Social Connections: Moderately Isolated (05/17/2022)   Social Connection and Isolation Panel [NHANES]    Frequency of Communication with Friends and Family: More than three times a week    Frequency of Social Gatherings with Friends and Family: Once a week    Attends Religious Services: More than 4 times per year    Active Member of [employer] or Organizations: No    Attends Archivist Meetings: Never    Marital Status: Separated  Intimate Partner Violence: Not At Risk (05/17/2022)   Humiliation, Afraid, Rape, and Kick questionnaire    Fear of Current or Ex-Partner: No    Emotionally Abused: No    Physically Abused: No    Sexually Abused: No    Review of Systems: See HPI, otherwise negative ROS  Physical Exam: BP 127/84 (BP Location: Right  Arm, Patient Position: Sitting, Cuff Size: Large)   Pulse 91   Temp 98.1 F (36.7 C) (Oral)   Ht '5\' 4"'$  (1.626 m)   Wt 179 lb 3.2 oz (81.3 kg)   SpO2 98%   BMI 30.76 kg/m  General:   Alert,  Well-developed, well-nourished, pleasant and cooperative in NAD Neck:  Supple; no masses or thyromegaly. No significant cervical adenopathy. Lungs:  Clear throughout to auscultation.   No wheezes, crackles, or rhonchi. No acute distress. Heart:  Regular rate and rhythm; no murmurs, clicks, rubs,  or gallops. Abdomen: Non-distended, normal bowel sounds.  Soft and nontender without appreciable mass or hepatosplenomegaly.  Pulses:  Normal pulses noted. Extremities:  Without clubbing or edema.  Impression/Plan:   68 year old lady with indeterminate/ulcerative colitis with a prednisone taper at this time.  Continues on Humira.  Respectable drug level blood on the lower end of the scale.  Also positive antibodies but not skyhigh.  Started azathioprine 25 mg daily about 2 weeks ago. TMPT metabolites are pending. Explained that adalimumab and azathioprine will be long-term chronic medications utilized to  Produce a deep remission. Also, I explained to the patient it may take another 4 weeks to start appreciating the full effects of azathioprine.  With current medical regimen, should not need nonsteroidal anti-inflammatory medications such as balsalazide.       Recommendations:  Continue to taper down on the prednisone until  completely off of it  Continue azathioprine at a dose of 25 mg daily for now.  Continue Humira 40 mg weekly.  We discussed the increased risk of skin cancers with this medication.  Protect yourself with limiting sun exposure and using sunscreen.  At your annual physical examination, have a good skin examination  Slightly increased risk of non-Hodgkin's lymphoma (3 out of 10,000 risk).  This is very low.  Medication has potential effect to alter your basic blood work including  hemoglobin, white blood cell count and liver enzymes.  Periodic blood work will be needed.  As discussed, we will be best to hold off your colonoscopy until later in the year how things "settle out" with your new medication regimen.  Once I get the special blood work back regarding azathioprine metabolism I will be back in touch with you.  Office visit here in 3 months  Repeat CBC and hepatic function profile in 2 weeks   Notice: This dictation was prepared with Dragon dictation along with smaller phrase technology. Any transcriptional errors that result from this process are unintentional and may not be corrected upon review.

## 2022-08-09 LAB — TPMT AND NUDT15 GENOTYPING
NUDT15 Metabolic Activity: NORMAL
TPMT Metabolic Activity: NORMAL

## 2022-08-09 LAB — CBC WITH DIFFERENTIAL/PLATELET
Basophils Absolute: 0.1 10*3/uL (ref 0.0–0.2)
Basos: 0 %
EOS (ABSOLUTE): 0 10*3/uL (ref 0.0–0.4)
Eos: 0 %
Hematocrit: 44.8 % (ref 34.0–46.6)
Hemoglobin: 15.3 g/dL (ref 11.1–15.9)
Immature Grans (Abs): 0.1 10*3/uL (ref 0.0–0.1)
Immature Granulocytes: 1 %
Lymphocytes Absolute: 1.5 10*3/uL (ref 0.7–3.1)
Lymphs: 9 %
MCH: 34.5 pg — ABNORMAL HIGH (ref 26.6–33.0)
MCHC: 34.2 g/dL (ref 31.5–35.7)
MCV: 101 fL — ABNORMAL HIGH (ref 79–97)
Monocytes Absolute: 0.3 10*3/uL (ref 0.1–0.9)
Monocytes: 2 %
Neutrophils Absolute: 15.2 10*3/uL — ABNORMAL HIGH (ref 1.4–7.0)
Neutrophils: 88 %
Platelets: 295 10*3/uL (ref 150–450)
RBC: 4.44 x10E6/uL (ref 3.77–5.28)
RDW: 12.3 % (ref 11.7–15.4)
WBC: 17.2 10*3/uL — ABNORMAL HIGH (ref 3.4–10.8)

## 2022-08-09 LAB — HEPATIC FUNCTION PANEL
ALT: 18 IU/L (ref 0–32)
AST: 15 IU/L (ref 0–40)
Albumin: 4.6 g/dL (ref 3.9–4.9)
Alkaline Phosphatase: 96 IU/L (ref 44–121)
Bilirubin Total: 0.8 mg/dL (ref 0.0–1.2)
Bilirubin, Direct: 0.19 mg/dL (ref 0.00–0.40)
Total Protein: 7.3 g/dL (ref 6.0–8.5)

## 2022-08-19 ENCOUNTER — Other Ambulatory Visit (HOSPITAL_COMMUNITY): Payer: Self-pay | Admitting: Family Medicine

## 2022-08-19 DIAGNOSIS — C50512 Malignant neoplasm of lower-outer quadrant of left female breast: Secondary | ICD-10-CM

## 2022-08-20 ENCOUNTER — Other Ambulatory Visit: Payer: Self-pay | Admitting: *Deleted

## 2022-08-20 DIAGNOSIS — Z170 Estrogen receptor positive status [ER+]: Secondary | ICD-10-CM

## 2022-08-20 MED ORDER — ANASTROZOLE 1 MG PO TABS: 1.0000 mg | ORAL_TABLET | Freq: Every day | ORAL | 3 refills | Status: DC

## 2022-08-20 NOTE — Telephone Encounter (Signed)
Patient called for refill on Arimidex, as last script was denied for unknown reason.  Per last OVN, patient is tolerating well and is to continue therapy until end of 2025.

## 2022-08-21 ENCOUNTER — Other Ambulatory Visit: Payer: Self-pay | Admitting: Internal Medicine

## 2022-08-21 ENCOUNTER — Other Ambulatory Visit: Payer: Self-pay | Admitting: Gastroenterology

## 2022-08-21 DIAGNOSIS — K529 Noninfective gastroenteritis and colitis, unspecified: Secondary | ICD-10-CM

## 2022-08-21 DIAGNOSIS — Z79899 Other long term (current) drug therapy: Secondary | ICD-10-CM

## 2022-08-21 LAB — HEPATIC FUNCTION PANEL
ALT: 23 IU/L (ref 0–32)
AST: 25 IU/L (ref 0–40)
Albumin: 4.4 g/dL (ref 3.9–4.9)
Alkaline Phosphatase: 74 IU/L (ref 44–121)
Bilirubin Total: 0.6 mg/dL (ref 0.0–1.2)
Bilirubin, Direct: 0.13 mg/dL (ref 0.00–0.40)
Total Protein: 7.1 g/dL (ref 6.0–8.5)

## 2022-08-21 LAB — CBC WITH DIFFERENTIAL/PLATELET
Basophils Absolute: 0 10*3/uL (ref 0.0–0.2)
Basos: 0 %
EOS (ABSOLUTE): 0.1 10*3/uL (ref 0.0–0.4)
Eos: 1 %
Hematocrit: 41.4 % (ref 34.0–46.6)
Hemoglobin: 14.6 g/dL (ref 11.1–15.9)
Immature Grans (Abs): 0 10*3/uL (ref 0.0–0.1)
Immature Granulocytes: 1 %
Lymphocytes Absolute: 2 10*3/uL (ref 0.7–3.1)
Lymphs: 23 %
MCH: 34.8 pg — ABNORMAL HIGH (ref 26.6–33.0)
MCHC: 35.3 g/dL (ref 31.5–35.7)
MCV: 99 fL — ABNORMAL HIGH (ref 79–97)
Monocytes Absolute: 0.9 10*3/uL (ref 0.1–0.9)
Monocytes: 10 %
Neutrophils Absolute: 5.6 10*3/uL (ref 1.4–7.0)
Neutrophils: 65 %
Platelets: 253 10*3/uL (ref 150–450)
RBC: 4.19 x10E6/uL (ref 3.77–5.28)
RDW: 12.4 % (ref 11.7–15.4)
WBC: 8.6 10*3/uL (ref 3.4–10.8)

## 2022-08-22 LAB — MICROALBUMIN / CREATININE URINE RATIO
Creatinine, Urine: 92.8 mg/dL
Microalb/Creat Ratio: 4 mg/g creat (ref 0–29)
Microalbumin, Urine: 4.1 ug/mL

## 2022-08-22 LAB — HEMOGLOBIN A1C
Est. average glucose Bld gHb Est-mCnc: 148 mg/dL
Hgb A1c MFr Bld: 6.8 % — ABNORMAL HIGH (ref 4.8–5.6)

## 2022-08-22 LAB — LIPID PANEL
Chol/HDL Ratio: 4.3 ratio (ref 0.0–4.4)
Cholesterol, Total: 220 mg/dL — ABNORMAL HIGH (ref 100–199)
HDL: 51 mg/dL (ref >39)
LDL Chol Calc (NIH): 129 mg/dL — ABNORMAL HIGH (ref 0–99)
Triglycerides: 225 mg/dL — ABNORMAL HIGH (ref 0–149)
VLDL Cholesterol Cal: 40 mg/dL (ref 5–40)

## 2022-08-28 ENCOUNTER — Other Ambulatory Visit: Payer: Self-pay | Admitting: Gastroenterology

## 2022-09-04 ENCOUNTER — Telehealth: Payer: Self-pay

## 2022-09-04 NOTE — Telephone Encounter (Signed)
Beth Hamilton. She has an upcoming appt with you on 09/10/2022.

## 2022-09-04 NOTE — Telephone Encounter (Signed)
Fax was received from [employer] stating that beginning October 21, 2022 Humira will no longer be covered. The options are adalimumab-adaz, Hyrimoz, Rinvoq, Stelara (SubQ), Xeljanz/XR, Skyrizi (Sub Q) or Hartley.

## 2022-09-05 ENCOUNTER — Telehealth: Payer: Self-pay

## 2022-09-05 NOTE — Telephone Encounter (Signed)
Pt was made aware and verbalized understanding.   Beth Hamilton

## 2022-09-05 NOTE — Telephone Encounter (Signed)
Pt called stating that the azathioprine is causing her to be nauseous and throw up. She states she has tried taking it at different times and not with other medicines and nothing helps. Pt stopped taking it yesterday and states that she is not taking it anymore. Pt is aware she has an appt Tuesday. Please advise.

## 2022-09-05 NOTE — Telephone Encounter (Signed)
noted 

## 2022-09-09 NOTE — Progress Notes (Unsigned)
GI Office Note    Referring Provider: [name], MD Primary Care Physician:  [name], MD  Primary Gastroenterologist: Garfield Cornea, MD   Chief Complaint   No chief complaint on file.   History of Present Illness   Beth Hamilton is a 67 y.o. female presenting today for follow up. Last seen 07/2022. H/o indeterminate/ulcerative colitis.     Azathioprine causing nausea/vomiting.   Humira drug level of 6.1 with low titer antibodies of 62. Taking humira 10mg [place] weekly.  Medications   Current Outpatient Medications  Medication Sig Dispense Refill   alendronate (FOSAMAX) 70 MG tablet Take 1 tablet (70 mg total) by mouth once a week. Take with a full glass of water on an empty stomach. 52 tablet 3   allopurinol (ZYLOPRIM) 300 MG tablet Take 1 tablet (300 mg total) by mouth daily. 90 tablet 3   amLODipine (NORVASC) 5 MG tablet Take 1 tablet (5 mg total) by mouth daily. 90 tablet 1   anastrozole (ARIMIDEX) 1 MG tablet Take 1 tablet (1 mg total) by mouth daily. 90 tablet 3   ARMOUR THYROID 60 MG tablet Take 60 mg by mouth daily.      azaTHIOprine (IMURAN) 50 MG tablet Take 0.5 tablets (25 mg total) by mouth daily. 30 tablet 3   B Complex Vitamins (VITAMIN-B COMPLEX PO) Take 1 mL by mouth daily. Vit b12     Calcium Citrate-Vitamin D (CITRACAL + D PO) Take by mouth.     fluticasone (CUTIVATE) 0.05 % cream APPLY TO AFFECTED AREAS TWICE DAILY. (Patient taking differently: as needed. APPLY TO AFFECTED AREAS TWICE DAILY.) 60 g 1   folic acid (FOLVITE) 4A999333MCG tablet Take 400 mcg by mouth daily.      HUMIRA PEN 40 MG/0.4ML PNKT INJECT 1 PEN UNDER THE SKIN EVERY 7 DAYS. 4 each 5   lisinopril-hydrochlorothiazide (ZESTORETIC) 20-25 MG tablet Take 1 tablet by mouth daily. 90 tablet 3   predniSONE (DELTASONE) 10 MG tablet TAKE 40MG ORALLY DAILY FOR 7 DAYS, THEN 30MG ORALLY DAILY FOR 7 DAYS, THEN 20MG ORALLY DAILY FOR 7 DAY, THEN 10MG DAILY FOR 7 DAYS, THEN 5MG DAILY FOR 7  DAYS. 30 tablet 1   No current facility-administered medications for this visit.    Allergies   Allergies as of 09/10/2022 - Review Complete 08/06/2022  Allergen Reaction Noted   Flagyl [metronidazole]  10/29/2013   Other Other (See Comments) 03/22/2014   Levaquin [levofloxacin in d5w]  07/23/2013     Past Medical History   Past Medical History:  Diagnosis Date   Breast cancer ([place] 04/20/14   left breast   Complication of anesthesia    not sure what med was given in 96 with D&C, was very sore all over. Dr GPatsey Bertholddid anesthesia. Dr WHeide Sparkdid surgery   Gout    Hemorrhoids    History of breast cancer 05/19/2015   Hypertension    Hypothyroidism    IBD (inflammatory bowel disease)    UC   Personal history of radiation therapy    2015   Pneumonia    Psoriasis    Thyroid disease    Ulcerative colitis ([place] 5 1 2007   Dr RGala Romney   Past Surgical History   Past Surgical History:  Procedure Laterality Date   BIOPSY  08/02/2019   Procedure: BIOPSY;  Surgeon: RDaneil Dolin MD;  [facility];  Service: Endoscopy;;  colon   BREAST BIOPSY  BREAST LUMPECTOMY Left 04/20/2014   COLONOSCOPY  10/09/2001   RMR: Internal hemorrhoids and anal papilla; otherwise normal rectum   COLONOSCOPY  12/10/2005   incomplete tcs-  colitis   COLONOSCOPY N/A 04/18/2014   Dr.Rourk- abnormal rectum & L colon into the mid descending segment. mucosa was erythematious as well as pale w/ some loss of normal vascular apttern. no erosions or ulcers. pt had some smooth peduculated 3-4cm polyps in the sigmoid segment most c/w pseudopolyps. the rest of the colonic mucosa appeared normal. bx= inflammatory polyps   COLONOSCOPY WITH PROPOFOL N/A 08/02/2019   Procedure: COLONOSCOPY WITH PROPOFOL;  Surgeon: Daneil Dolin, MD;  [facility];  Service: Endoscopy;  Laterality: N/A;  8:30am   DILATION AND CURETTAGE OF UTERUS  1996   Dr Heide Spark   FLEXIBLE SIGMOIDOSCOPY  02/26/2006    endoscopically normal-appearing rectum, colitis in sigmoid mucosa   PARTIAL MASTECTOMY WITH NEEDLE LOCALIZATION AND AXILLARY SENTINEL LYMPH NODE BX Left 04/20/2014   Procedure: PARTIAL MASTECTOMY AFTER NEEDLE LOCALIZATION AND AXILLARY SENTINEL LYMPH NODE BX;  Surgeon: Jamesetta So, MD;  Location: [facility];  Service: General;  Laterality: Left;   TUBAL LIGATION     WISDOM TOOTH EXTRACTION      Past Family History   Family History  Problem Relation Age of Onset   Stroke Maternal Grandfather    Heart attack Maternal Grandfather    Hypertension Mother    Diabetes Paternal Grandfather    Heart disease Paternal Grandfather    Colon cancer Neg Hx     Past Social History   Social History   Socioeconomic History   Marital status: Legally Separated    Spouse name: Not on file   Number of children: 1   Years of education: Not on file   Highest education level: Not on file  Occupational History   Not on file  Tobacco Use   Smoking status: Former    Packs/day: 0.00    Years: 15.00    Total pack years: 0.00    Types: Cigarettes    Quit date: 07/26/2005    Years since quitting: 17.1   Smokeless tobacco: Never   Tobacco comments:    quit in 2007  Vaping Use   Vaping Use: Never used  Substance and Sexual Activity   Alcohol use: Yes    Alcohol/week: 14.0 standard drinks of alcohol    Types: 14 Glasses of wine per week    Comment: wine every day   Drug use: No   Sexual activity: Not Currently    Birth control/protection: Post-menopausal, Abstinence, Surgical    Comment: tubal  Other Topics Concern   Not on file  Social History Narrative   Not on file   Social Determinants of Health   Financial Resource Strain: Low Risk  (05/17/2022)   Overall Financial Resource Strain (CARDIA)    Difficulty of Paying Living Expenses: Not hard at all  Food Insecurity: No Food Insecurity (05/17/2022)   Hunger Vital Sign    Worried About Running Out of Food in the Last Year: Never true    Ran  Out of Food in the Last Year: Never true  Transportation Needs: No Transportation Needs (05/17/2022)   PRAPARE - Hydrologist (Medical): No    Lack of Transportation (Non-Medical): No  Physical Activity: Insufficiently Active (05/17/2022)   Exercise Vital Sign    Days of Exercise per Week: 3 days    Minutes of Exercise per Session:  10 min  Stress: No Stress Concern Present (05/17/2022)   [place]    Feeling of Stress : Not at all  Social Connections: Moderately Isolated (05/17/2022)   Social Connection and Isolation Panel [NHANES]    Frequency of Communication with Friends and Family: More than three times a week    Frequency of Social Gatherings with Friends and Family: Once a week    Attends Religious Services: More than 4 times per year    Active Member of [employer] or Organizations: No    Attends Archivist Meetings: Never    Marital Status: Separated  Intimate Partner Violence: Not At Risk (05/17/2022)   Humiliation, Afraid, Rape, and Kick questionnaire    Fear of Current or Ex-Partner: No    Emotionally Abused: No    Physically Abused: No    Sexually Abused: No    Review of Systems   General: Negative for anorexia, weight loss, fever, chills, fatigue, weakness. ENT: Negative for hoarseness, difficulty swallowing , nasal congestion. CV: Negative for chest pain, angina, palpitations, dyspnea on exertion, peripheral edema.  Respiratory: Negative for dyspnea at rest, dyspnea on exertion, cough, sputum, wheezing.  GI: See history of present illness. GU:  Negative for dysuria, hematuria, urinary incontinence, urinary frequency, nocturnal urination.  Endo: Negative for unusual weight change.     Physical Exam   There were no vitals taken for this visit.   General: Well-nourished, well-developed in no acute distress.  Eyes: No icterus. Mouth: Oropharyngeal mucosa moist and  pink , no lesions erythema or exudate. Lungs: Clear to auscultation bilaterally.  Heart: Regular rate and rhythm, no murmurs rubs or gallops.  Abdomen: Bowel sounds are normal, nontender, nondistended, no hepatosplenomegaly or masses,  no abdominal bruits or hernia , no rebound or guarding.  Rectal: ***  Extremities: No lower extremity edema. No clubbing or deformities. Neuro: Alert and oriented x 4   Skin: Warm and dry, no jaundice.   Psych: Alert and cooperative, normal mood and affect.  Labs   Lab Results  Component Value Date   ALT 23 08/20/2022   AST 25 08/20/2022   ALKPHOS 74 08/20/2022   BILITOT 0.6 08/20/2022   Lab Results  Component Value Date   WBC 8.6 08/20/2022   HGB 14.6 08/20/2022   HCT 41.4 08/20/2022   MCV 99 (H) 08/20/2022   PLT 253 08/20/2022   Lab Results  Component Value Date   HGBA1C 6.8 (H) 08/20/2022    Imaging Studies   No results found.  Assessment       PLAN   ***   Laureen Ochs. Bobby Rumpf, [place], [facility]

## 2022-09-10 ENCOUNTER — Encounter: Payer: Self-pay | Admitting: Gastroenterology

## 2022-09-10 ENCOUNTER — Ambulatory Visit: Payer: BC Managed Care – PPO | Admitting: Gastroenterology

## 2022-09-10 ENCOUNTER — Ambulatory Visit: Payer: BC Managed Care – PPO | Admitting: Family Medicine

## 2022-09-10 ENCOUNTER — Other Ambulatory Visit: Payer: Self-pay

## 2022-09-10 VITALS — BP 115/75 | Wt 171.0 lb

## 2022-09-10 VITALS — BP 121/80 | HR 100 | Temp 97.6°F | Ht 64.0 in | Wt 171.6 lb

## 2022-09-10 DIAGNOSIS — I10 Essential (primary) hypertension: Secondary | ICD-10-CM

## 2022-09-10 DIAGNOSIS — C50512 Malignant neoplasm of lower-outer quadrant of left female breast: Secondary | ICD-10-CM | POA: Diagnosis not present

## 2022-09-10 DIAGNOSIS — K5190 Ulcerative colitis, unspecified, without complications: Secondary | ICD-10-CM

## 2022-09-10 DIAGNOSIS — E1169 Type 2 diabetes mellitus with other specified complication: Secondary | ICD-10-CM | POA: Diagnosis not present

## 2022-09-10 DIAGNOSIS — E785 Hyperlipidemia, unspecified: Secondary | ICD-10-CM

## 2022-09-10 DIAGNOSIS — E119 Type 2 diabetes mellitus without complications: Secondary | ICD-10-CM | POA: Insufficient documentation

## 2022-09-10 DIAGNOSIS — Z170 Estrogen receptor positive status [ER+]: Secondary | ICD-10-CM

## 2022-09-10 MED ORDER — ACYCLOVIR 5 % EX OINT: 1.0000 | TOPICAL_OINTMENT | CUTANEOUS | 3 refills | Status: AC | PRN

## 2022-09-10 MED ORDER — METFORMIN HCL 500 MG PO TABS: ORAL_TABLET | ORAL | 1 refills | Status: DC

## 2022-09-10 MED ORDER — ROSUVASTATIN CALCIUM 5 MG PO TABS: 5.0000 mg | ORAL_TABLET | Freq: Every day | ORAL | 1 refills | Status: DC

## 2022-09-10 MED ORDER — PREDNISONE 10 MG PO TABS: ORAL_TABLET | ORAL | 0 refills | Status: DC

## 2022-09-10 NOTE — Patient Instructions (Signed)
Continue Humira for now.  We will work on switching you out to another medication option covered by your insurance.  I have sent in RX for prednisone.

## 2022-09-10 NOTE — Progress Notes (Addendum)
   Subjective:  This verifies that the history review of any tests, physical exam, assessment and plan were conducted by Dr. [name] and documented accordingly by him today [name] MD primary care [facility]   Patient ID: Beth Hamilton, female    DOB: [birth year], 67 y.o.   MRN: [id number]  HPI Patient arrives today for 4 month follow up for lab work. New onset diabetes She is being followed by gastroenterology for ulcerative colitis This seems to be under going some new treatments and she is trying to find the right medicine for her Having some symptoms She also is being treated by oncology for breast cancer and they see her on a regular basis. In addition to this has new onset diabetes.  We did discuss in detail physical activity dietary measures and medications.  We went through the choices of medicines including metformin as well as GLP-1's She states there is some room of improvement with her dietary choices but she has been trying to do better over the past several months her weight has come down.  Patient going through some stress related to a separation Patient denies being depressed  Review of Systems     Objective:   Physical Exam General-in no acute distress Eyes-no discharge Lungs-respiratory rate normal, CTA CV-no murmurs,RRR Extremities skin warm dry no edema Neuro grossly normal Behavior normal, alert  Foot exam normal      Assessment & Plan:  1. Diabetes mellitus without complication ([place]) The patient was seen today as part of a comprehensive visit for diabetes. The importance of keeping her A1c at or below 7 range was discussed.  Discussed diet, activity, and medication compliance Emphasized healthy eating primarily with vegetables fruits and if utilizing meats lean meats such as chicken or fish grilled baked broiled Avoid sugary drinks Minimize and avoid processed foods Fit in regular physical activity preferably 25 to 30 minutes  4 times per week Standard follow-up visit recommended.  Patient aware lack of control and follow-up increases risk of diabetic complications. Regular follow-up visits Yearly ophthalmology Yearly foot exam We did discuss medication patient chooses metformin as initial medicine she will do a half a tablet once a day for the first week then after that 1 tablet thereafter she will let us know if she is having any setbacks or problems Will do comprehensive work before her next visit in August 2. Hyperlipidemia associated with type 2 diabetes mellitus ([place]) Start statin.  Plan to do lipid liver in approximately 8 weeks - Lipid panel - Hepatic function panel  3. HTN (hypertension), benign Blood pressure under good control continue current measures fitted walking 20 to 25 minutes 4 times per week would be beneficial  4. Ulcerative colitis without complications, unspecified location [facility]) Follow through with gastroenterology they are working with her medicines  5. Malignant neoplasm of lower-outer quadrant of left breast of female, estrogen receptor positive ([place]) Follow through with oncology they are monitoring her  As per above lab work in 8 weeks Follow-up office visit by August

## 2022-09-10 NOTE — Patient Instructions (Signed)

## 2022-09-17 ENCOUNTER — Telehealth: Payer: Self-pay | Admitting: Gastroenterology

## 2022-09-17 DIAGNOSIS — K5100 Ulcerative (chronic) pancolitis without complications: Secondary | ICD-10-CM

## 2022-09-17 NOTE — Telephone Encounter (Signed)
Spoke with Dr. Gala Romney. We need to transition patient over to a different biologic since Humira not providing relief and did not tolerate adding immunomodulator.   Based on her formulary options, safety profiles, etc we recommend Stelara. She will need to have induction tx, Stelara '390mg'$  IV X 1. And then 8 weeks later start 10m [place] every 8 weeks.   She will need to update her Hep B surface Antigen, Hep C core Antibody, TB Gold test (should be done yearly). Also need to check kidney function.   Can you find out if she is current on her Tetanus? Has she had Shingles vaccine or Pneumonia vaccine? Ever vaccinated to Hepatitis A or B?  I have put in lab orders.   Please update patient.  I will be glad to talk with her over the phone Thursday, find out what time is most convenient.

## 2022-09-18 NOTE — Telephone Encounter (Signed)
Pt was made aware and verbalized understanding. Pt is agreeable with moving forward with Stelara. Pt was made aware of labs needing to be done and stated that she will try to have them completed on Friday. Pt stated that she was current on her tetanus and has never had the shingles, pneumonia or the Hep A or B vaccines. Pt is wanting to know if she will need to have those done to start this medication.

## 2022-09-19 NOTE — Telephone Encounter (Signed)
Form is in your basket to be completed. Will have to wait on labs to submit.

## 2022-09-19 NOTE — Telephone Encounter (Signed)
Beth Hamilton, Please start process for Stelara.   Let pt know she should consider shingles, pneumonia shots (NO LIVE VACCINES). If her labs show no immunity to hep a and b, then we will be advising vaccines to these as well. She will not need to have these done before starting Stelara. These can be done by PCP.   She should have yearly skin checks. Continue with yearly flu shots. She should never get LIVE vaccines while on medications like Humira or Stelara.

## 2022-09-26 ENCOUNTER — Encounter: Payer: Self-pay | Admitting: Internal Medicine

## 2022-09-26 LAB — BASIC METABOLIC PANEL
BUN/Creatinine Ratio: 20 (ref 12–28)
BUN: 14 mg/dL (ref 8–27)
CO2: 23 mmol/L (ref 20–29)
Calcium: 9.3 mg/dL (ref 8.7–10.3)
Chloride: 98 mmol/L (ref 96–106)
Creatinine, Ser: 0.69 mg/dL (ref 0.57–1.00)
Glucose: 140 mg/dL — ABNORMAL HIGH (ref 70–99)
Potassium: 4.2 mmol/L (ref 3.5–5.2)
Sodium: 139 mmol/L (ref 134–144)
eGFR: 97 mL/min/{1.73_m2} (ref >59)

## 2022-09-26 LAB — QUANTIFERON-TB GOLD PLUS
QuantiFERON Mitogen Value: 2.64 IU/mL
QuantiFERON Nil Value: 0.54 IU/mL
QuantiFERON TB1 Ag Value: 0.06 IU/mL
QuantiFERON TB2 Ag Value: 0.06 IU/mL
QuantiFERON-TB Gold Plus: NEGATIVE

## 2022-09-26 LAB — HEPATITIS B SURFACE ANTIBODY,QUALITATIVE: Hep B Surface Ab, Qual: NONREACTIVE

## 2022-09-26 LAB — HEPATITIS B CORE ANTIBODY, TOTAL: Hep B Core Total Ab: NEGATIVE

## 2022-09-26 LAB — HEPATITIS A ANTIBODY, TOTAL: hep A Total Ab: NEGATIVE

## 2022-10-02 NOTE — Telephone Encounter (Signed)
Forms completed. FYI, patient aware of vaccination requests,documented under lab result note.

## 2022-10-02 NOTE — Telephone Encounter (Signed)
Forms along with lab results and insurance information has been faxed for approval.

## 2022-10-07 ENCOUNTER — Telehealth: Payer: Self-pay

## 2022-10-07 NOTE — Telephone Encounter (Signed)
Order completed.   She should stop Humira about 7 days before induction dose of Stelara.  Once she has her infusion of Stelara, she will take her first Stelara shot at home, 8 weeks later. These shots will be 19mc [place] every 8 weeks.   She will need ov in about 2 months after Stelara induction given. Please arrange.

## 2022-10-07 NOTE — Telephone Encounter (Signed)
Orders were faxed and pt was made aware and verbalized understanding. Pt is going to let us know when infusion date is to get her scheduled for 2 mth follow up.

## 2022-10-07 NOTE — Telephone Encounter (Signed)
Order form for Stelara to be completed for infusion center is in your box.

## 2022-10-08 ENCOUNTER — Other Ambulatory Visit: Payer: Self-pay

## 2022-10-08 ENCOUNTER — Other Ambulatory Visit: Payer: Self-pay | Admitting: Gastroenterology

## 2022-10-09 ENCOUNTER — Other Ambulatory Visit: Payer: Self-pay | Admitting: Gastroenterology

## 2022-10-09 NOTE — Telephone Encounter (Signed)
Noted. Thanks.

## 2022-10-09 NOTE — Telephone Encounter (Signed)
Pt is scheduled for the induction infusion on 3/28 and has a follow up with you scheduled for 5/28.

## 2022-10-15 ENCOUNTER — Other Ambulatory Visit: Payer: Self-pay | Admitting: Gastroenterology

## 2022-10-17 ENCOUNTER — Encounter (HOSPITAL_COMMUNITY)
Admission: RE | Admit: 2022-10-17 | Discharge: 2022-10-17 | Disposition: A | Discharge status: Home / Self Care | Payer: BC Managed Care – PPO | Source: Ambulatory Visit | Attending: Gastroenterology | Admitting: Gastroenterology

## 2022-10-17 VITALS — BP 144/77 | HR 90 | Temp 98.4°F | Resp 17 | Wt 173.0 lb

## 2022-10-17 DIAGNOSIS — K5190 Ulcerative colitis, unspecified, without complications: Secondary | ICD-10-CM | POA: Diagnosis not present

## 2022-10-17 MED ORDER — USTEKINUMAB 130 MG/26ML IV SOLN
390.0000 mg | Freq: Once | INTRAVENOUS | Status: AC
  Administered 2022-10-17: 390 mg via INTRAVENOUS
  Filled 2022-10-17: qty 78

## 2022-10-17 NOTE — Progress Notes (Addendum)
Diagnosis: Ulcerative Colitis   Provider:  Neil Crouch, PA   Procedure: Infusion  IV Type: Peripheral, IV Location: L Antecubital  Stelera (Ustekinumab), Dose: 390 mg  Infusion Start Time: [id number]  Infusion Stop Time: 1000    Post Infusion IV Care: Patient declined observation  Pt was unable to stay during entire observation period. Stated she needed to go back to work.   Discharge: Condition: Good, Destination: Home . AVS Provided and AVS Declined  Performed by:  Grayland Ormond, RN

## 2022-10-21 ENCOUNTER — Telehealth: Payer: Self-pay

## 2022-10-21 NOTE — Telephone Encounter (Signed)
Beth Hamilton, she takes her first injection 8 weeks after her infusion. And the injections are every 8 weeks.  She should notice a clinical response within 8 weeks.

## 2022-10-21 NOTE — Telephone Encounter (Signed)
Pt was made aware and verbalized understanding.  

## 2022-10-21 NOTE — Telephone Encounter (Signed)
Pt called letting us know that she had her Stelara infusion on 10/17/2022. Pt is wanting to make sure that her first injection is 11/12/22. Pt is also wanting to know how long it should take before she starts seeing improvement. Pt is aware you are out today and will return tomorrow.

## 2022-10-25 ENCOUNTER — Other Ambulatory Visit: Payer: Self-pay | Admitting: Family Medicine

## 2022-10-28 ENCOUNTER — Other Ambulatory Visit: Payer: Self-pay | Admitting: *Deleted

## 2022-10-28 ENCOUNTER — Telehealth: Payer: Self-pay

## 2022-10-28 MED ORDER — AMLODIPINE BESYLATE 5 MG PO TABS: 5.0000 mg | ORAL_TABLET | Freq: Every day | ORAL | 0 refills | Status: DC

## 2022-10-28 MED ORDER — PREDNISONE 10 MG PO TABS: ORAL_TABLET | ORAL | 0 refills | Status: DC

## 2022-10-28 NOTE — Telephone Encounter (Signed)
Pt states that she has been off of prednisone for at least a couple weeks.

## 2022-10-28 NOTE — Telephone Encounter (Signed)
Pt was made aware and verbalized understanding.  

## 2022-10-28 NOTE — Addendum Note (Signed)
Addended by: Tiffany Kocher on: 10/28/2022 02:35 PM   Modules accepted: Orders

## 2022-10-28 NOTE — Telephone Encounter (Signed)
Pt called requesting an Rx for prednisone to be sent in to [facility] in [place].

## 2022-10-28 NOTE — Telephone Encounter (Signed)
Please find out if patient had completed weaned off the prednisone from her last visit? If she is flaring, I need to know at what dose she was on when the flaring starting because I prefer to start lowest dose possible while giving time for Stelera to kick in.

## 2022-10-28 NOTE — Telephone Encounter (Signed)
Starting her at 20mg  daily with taper.

## 2022-10-29 ENCOUNTER — Other Ambulatory Visit: Payer: Self-pay | Admitting: Family Medicine

## 2022-10-29 DIAGNOSIS — M10072 Idiopathic gout, left ankle and foot: Secondary | ICD-10-CM

## 2022-11-09 LAB — HEPATIC FUNCTION PANEL
ALT: 15 IU/L (ref 0–32)
AST: 17 IU/L (ref 0–40)
Albumin: 4.2 g/dL (ref 3.9–4.9)
Alkaline Phosphatase: 75 IU/L (ref 44–121)
Bilirubin Total: 0.5 mg/dL (ref 0.0–1.2)
Bilirubin, Direct: 0.12 mg/dL (ref 0.00–0.40)
Total Protein: 7 g/dL (ref 6.0–8.5)

## 2022-11-09 LAB — LIPID PANEL
Chol/HDL Ratio: 2.3 ratio (ref 0.0–4.4)
Cholesterol, Total: 174 mg/dL (ref 100–199)
HDL: 76 mg/dL (ref >39)
LDL Chol Calc (NIH): 79 mg/dL (ref 0–99)
Triglycerides: 107 mg/dL (ref 0–149)
VLDL Cholesterol Cal: 19 mg/dL (ref 5–40)

## 2022-11-11 ENCOUNTER — Encounter: Payer: Self-pay | Admitting: Family Medicine

## 2022-11-11 NOTE — Progress Notes (Signed)
Please mail to patient

## 2022-11-29 ENCOUNTER — Other Ambulatory Visit: Payer: Self-pay | Admitting: Gastroenterology

## 2022-12-05 NOTE — Telephone Encounter (Signed)
Refill for prednisone came in from pharmacy. I have denied. She has upcoming appt on 12/17/22 with ME but I am on PAL that day. She needs to be rescheduled please.

## 2022-12-17 ENCOUNTER — Ambulatory Visit: Payer: BC Managed Care – PPO | Admitting: Gastroenterology

## 2022-12-27 ENCOUNTER — Encounter: Payer: Self-pay | Admitting: Gastroenterology

## 2022-12-27 ENCOUNTER — Ambulatory Visit: Payer: BC Managed Care – PPO | Admitting: Gastroenterology

## 2022-12-27 ENCOUNTER — Telehealth: Payer: Self-pay | Admitting: Gastroenterology

## 2022-12-27 VITALS — BP 116/79 | HR 88 | Temp 98.4°F | Ht 64.0 in | Wt 173.0 lb

## 2022-12-27 DIAGNOSIS — K5130 Ulcerative (chronic) rectosigmoiditis without complications: Secondary | ICD-10-CM | POA: Diagnosis not present

## 2022-12-27 NOTE — Telephone Encounter (Signed)
Please let pt know that Dr. Jena Gauss advises colonoscopy in July/August.   Please go ahead and schedule whenever you have his schedule out.  ASA 2 Dx: ulcerative colitis, high risk screening Day of prep: hold metformin AM of TCS: hold metformin No large volume preps please. She prefers Mondays EARLY CASE

## 2022-12-27 NOTE — Patient Instructions (Signed)
Continue Stelara as scheduled, every 8 weeks. I will speak with Dr. Jena Gauss about timing of your next colonoscopy. We will be in touch, may be after I return from vacation next week.  Please keep [place] posted if you have any worsening of symptoms. I am hopeful you are on the verge of "turning the corner" with your illness!

## 2022-12-27 NOTE — Progress Notes (Signed)
GI Office Note    Referring Provider: Babs Sciara, MD Primary Care Physician:  Babs Sciara, MD  Primary Gastroenterologist: Roetta Sessions, MD   Chief Complaint   Chief Complaint  Patient presents with   Follow-up    Doing better, still going to the bathroom often, no low grade fevers    History of Present Illness   Beth Hamilton is a 67 y.o. female presenting today for follow up. H/o indeterminate/ulcerative colitis. Last seen in 08/2022.   Had breakthrough symptoms last year on Humira, on weekly dosing. Drug level 6.1 with low titer antibodies of 62. We considered switching to [facility] but she was not interested after reading up on the medication. She was continued on Humira but azathioprine was added at 25mg  daily (lower dose due to coadministration with allopurinol). TMPT metabolite assay unremarkable. Initially tolerated azathioprine but eventually developed N/V and medication had to be stopped.   She received Stelara infusion 10/17/22. She received her first SQ injection 8 weeks later.   Her insurance covers adalimumab-adaz, Hyrimoz, Rinvoq, Stelara (SQ), Xeljanz/XR, Skyrizi (SQ) or Zeposia.   Today: started to see positive changes. No longer having low grade fevers. Starting to see some consistency with her stools, still pretty loose though. Typically has several stools in the morning. Sometimes wakes up at 5am and passing some watery stools. Will typically have postprandial stools. Having about 5-6 stools per day but can go from supper until the morning without BM. Very little scant brb on toilet tissue at times, none in the stool. Still has some urgency. Abdominal pain/pressure improved. Fatigue improved.   Wants to know when her next colonoscopy can be done.      Medications   Current Outpatient Medications  Medication Sig Dispense Refill   acyclovir ointment (ZOVIRAX) 5 % Apply 1 Application topically every 4 (four) hours as needed. 30 g 3   allopurinol  (ZYLOPRIM) 300 MG tablet TAKE 1 TABLET BY MOUTH EVERY DAY 90 tablet 1   amLODipine (NORVASC) 5 MG tablet Take 1 tablet (5 mg total) by mouth daily. 90 tablet 0   anastrozole (ARIMIDEX) 1 MG tablet Take 1 tablet (1 mg total) by mouth daily. 90 tablet 3   ARMOUR THYROID 60 MG tablet Take 60 mg by mouth daily. 1/2 pill on Sat and Sun.     B Complex Vitamins (VITAMIN-B COMPLEX PO) Take 1 mL by mouth daily. Vit b12     Calcium Citrate-Vitamin D (CITRACAL + D PO) Take by mouth.     fluticasone (CUTIVATE) 0.05 % cream APPLY TO AFFECTED AREAS TWICE DAILY. (Patient taking differently: as needed. APPLY TO AFFECTED AREAS TWICE DAILY.) 60 g 1   folic acid (FOLVITE) 400 MCG tablet Take 400 mcg by mouth daily.      lisinopril-hydrochlorothiazide (ZESTORETIC) 20-25 MG tablet TAKE 1 TABLET BY MOUTH EVERY DAY 90 tablet 0   metFORMIN (GLUCOPHAGE) 500 MG tablet 1 qd 90 tablet 1   rosuvastatin (CRESTOR) 5 MG tablet Take 1 tablet (5 mg total) by mouth daily. 90 tablet 1   STELARA 90 MG/ML SOSY injection Inject into the skin.     No current facility-administered medications for this visit.    Allergies   Allergies as of 12/27/2022 - Review Complete 12/27/2022  Allergen Reaction Noted   Flagyl [metronidazole]  10/29/2013   Other Other (See Comments) 03/22/2014   Levaquin [levofloxacin in d5w]  07/23/2013        Review of Systems  General: Negative for anorexia, weight loss, fever, chills, fatigue, weakness. ENT: Negative for hoarseness, difficulty swallowing , nasal congestion. CV: Negative for chest pain, angina, palpitations, dyspnea on exertion, peripheral edema.  Respiratory: Negative for dyspnea at rest, dyspnea on exertion, cough, sputum, wheezing.  GI: See history of present illness. GU:  Negative for dysuria, hematuria, urinary incontinence, urinary frequency, nocturnal urination.  Endo: Negative for unusual weight change.     Physical Exam   BP 116/79 (BP Location: Right Arm, Patient  Position: Sitting, Cuff Size: Normal)   Pulse 88   Temp 98.4 F (36.9 C) (Oral)   Ht 5\' 4"  (1.626 m)   Wt 173 lb (78.5 kg)   SpO2 98%   BMI 29.70 kg/m    General: Well-nourished, well-developed in no acute distress.  Eyes: No icterus. Mouth: Oropharyngeal mucosa moist and pink  .  Abdomen: Bowel sounds are normal, nontender, nondistended, no hepatosplenomegaly or masses,  no abdominal bruits or hernia , no rebound or guarding.  Rectal: not performed Extremities: No lower extremity edema. No clubbing or deformities. Neuro: Alert and oriented x 4   Skin: Warm and dry, no jaundice.   Psych: Alert and cooperative, normal mood and affect.  Labs   Lab Results  Component Value Date   ALT 15 11/08/2022   AST 17 11/08/2022   ALKPHOS 75 11/08/2022   BILITOT 0.5 11/08/2022   Lab Results  Component Value Date   CREATININE 0.69 09/20/2022   BUN 14 09/20/2022   NA 139 09/20/2022   K 4.2 09/20/2022   CL 98 09/20/2022   CO2 23 09/20/2022   Lab Results  Component Value Date   WBC 8.6 08/20/2022   HGB 14.6 08/20/2022   HCT 41.4 08/20/2022   MCV 99 (H) 08/20/2022   PLT 253 08/20/2022    Imaging Studies   No results found.  Assessment   Indeterminant/ulcerative colitis: started Stelara in March.  Received initial infusion and first subcu dosing.  Due for her next dose at the end of July.  Starting to see some positive changes not yet in clinical remission.  He is overdue for colonoscopy, was due back in January 2023, procedure has been delayed because of active disease/medication transitions.   PLAN   Continue Stelara 90 mg subcu every 8 weeks. Will discuss timing of next colonoscopy with Dr. Jena Gauss.  Patient did well previously with MiraLAX prep.  Does not do well with large volume preps.  He is open to trying newer small-volume preps.    Leanna Battles. Melvyn Neth, MHS, PA-C [facility]

## 2022-12-30 NOTE — Telephone Encounter (Signed)
Pt states she prefers to wait until August, wants a Monday and 1st thing in the morning. Advised pt will call once we get providers schedule. Verbalized understanding.

## 2022-12-30 NOTE — Telephone Encounter (Signed)
Pt was made aware and is ready to move forward with scheduling.  

## 2023-01-27 ENCOUNTER — Other Ambulatory Visit: Payer: Self-pay | Admitting: Family Medicine

## 2023-02-04 ENCOUNTER — Encounter: Payer: Self-pay | Admitting: *Deleted

## 2023-02-04 NOTE — Telephone Encounter (Signed)
Pt has been scheduled for 03/10/23. Instructions mailed to pt

## 2023-02-05 ENCOUNTER — Other Ambulatory Visit: Payer: Self-pay

## 2023-03-03 ENCOUNTER — Other Ambulatory Visit: Payer: Self-pay | Admitting: Family Medicine

## 2023-03-04 ENCOUNTER — Other Ambulatory Visit: Payer: Self-pay | Admitting: *Deleted

## 2023-03-04 DIAGNOSIS — K5190 Ulcerative colitis, unspecified, without complications: Secondary | ICD-10-CM

## 2023-03-04 NOTE — Telephone Encounter (Signed)
Pt informed that needed to have lab work prior to procedure. She states she is working from home on Thursday (03/06/23) and she will go that morning. Lab ordered in epic.

## 2023-03-06 ENCOUNTER — Other Ambulatory Visit: Payer: Self-pay

## 2023-03-06 DIAGNOSIS — K5190 Ulcerative colitis, unspecified, without complications: Secondary | ICD-10-CM

## 2023-03-07 LAB — BASIC METABOLIC PANEL
BUN/Creatinine Ratio: 20 (ref 12–28)
BUN: 14 mg/dL (ref 8–27)
CO2: 23 mmol/L (ref 20–29)
Calcium: 9.1 mg/dL (ref 8.7–10.3)
Chloride: 98 mmol/L (ref 96–106)
Creatinine, Ser: 0.7 mg/dL (ref 0.57–1.00)
Glucose: 104 mg/dL — ABNORMAL HIGH (ref 70–99)
Potassium: 6.2 mmol/L — ABNORMAL HIGH (ref 3.5–5.2)
Sodium: 140 mmol/L (ref 134–144)
eGFR: 97 mL/min/{1.73_m2} (ref >59)

## 2023-03-10 ENCOUNTER — Ambulatory Visit (HOSPITAL_COMMUNITY)
Admission: RE | Admit: 2023-03-10 | Discharge: 2023-03-10 | Disposition: A | Discharge status: Home / Self Care | Payer: BC Managed Care – PPO | Attending: Internal Medicine | Admitting: Internal Medicine

## 2023-03-10 ENCOUNTER — Encounter (HOSPITAL_COMMUNITY): Payer: Self-pay | Admitting: Internal Medicine

## 2023-03-10 ENCOUNTER — Ambulatory Visit (HOSPITAL_COMMUNITY): Payer: BC Managed Care – PPO | Admitting: Anesthesiology

## 2023-03-10 ENCOUNTER — Telehealth: Payer: Self-pay

## 2023-03-10 ENCOUNTER — Encounter (HOSPITAL_COMMUNITY)
Admission: RE | Disposition: A | Discharge status: Home / Self Care | Payer: Self-pay | Source: Home / Self Care | Attending: Internal Medicine

## 2023-03-10 ENCOUNTER — Other Ambulatory Visit: Payer: Self-pay

## 2023-03-10 DIAGNOSIS — E039 Hypothyroidism, unspecified: Secondary | ICD-10-CM | POA: Diagnosis not present

## 2023-03-10 DIAGNOSIS — K5150 Left sided colitis without complications: Secondary | ICD-10-CM | POA: Diagnosis not present

## 2023-03-10 DIAGNOSIS — K6289 Other specified diseases of anus and rectum: Secondary | ICD-10-CM | POA: Diagnosis not present

## 2023-03-10 DIAGNOSIS — Z87891 Personal history of nicotine dependence: Secondary | ICD-10-CM | POA: Insufficient documentation

## 2023-03-10 DIAGNOSIS — Z8249 Family history of ischemic heart disease and other diseases of the circulatory system: Secondary | ICD-10-CM | POA: Diagnosis not present

## 2023-03-10 DIAGNOSIS — Z7984 Long term (current) use of oral hypoglycemic drugs: Secondary | ICD-10-CM | POA: Insufficient documentation

## 2023-03-10 DIAGNOSIS — Z833 Family history of diabetes mellitus: Secondary | ICD-10-CM | POA: Insufficient documentation

## 2023-03-10 DIAGNOSIS — I10 Essential (primary) hypertension: Secondary | ICD-10-CM | POA: Diagnosis not present

## 2023-03-10 DIAGNOSIS — Z1211 Encounter for screening for malignant neoplasm of colon: Secondary | ICD-10-CM | POA: Insufficient documentation

## 2023-03-10 DIAGNOSIS — Z8711 Personal history of peptic ulcer disease: Secondary | ICD-10-CM | POA: Insufficient documentation

## 2023-03-10 DIAGNOSIS — E119 Type 2 diabetes mellitus without complications: Secondary | ICD-10-CM | POA: Diagnosis not present

## 2023-03-10 HISTORY — PX: COLONOSCOPY WITH PROPOFOL: SHX5780

## 2023-03-10 HISTORY — PX: POLYPECTOMY: SHX5525

## 2023-03-10 HISTORY — PX: BIOPSY: SHX5522

## 2023-03-10 LAB — POCT I-STAT, CHEM 8
BUN: 7 mg/dL — ABNORMAL LOW (ref 8–23)
Calcium, Ion: 1.13 mmol/L — ABNORMAL LOW (ref 1.15–1.40)
Chloride: 102 mmol/L (ref 98–111)
Creatinine, Ser: 0.7 mg/dL (ref 0.44–1.00)
Glucose, Bld: 112 mg/dL — ABNORMAL HIGH (ref 70–99)
HCT: 42 % (ref 36.0–46.0)
Hemoglobin: 14.3 g/dL (ref 12.0–15.0)
Potassium: 3.5 mmol/L (ref 3.5–5.1)
Sodium: 140 mmol/L (ref 135–145)
TCO2: 27 mmol/L (ref 22–32)

## 2023-03-10 LAB — GLUCOSE, CAPILLARY: Glucose-Capillary: 115 mg/dL — ABNORMAL HIGH (ref 70–99)

## 2023-03-10 SURGERY — COLONOSCOPY WITH PROPOFOL
Anesthesia: General

## 2023-03-10 MED ORDER — HYOSCYAMINE SULFATE 0.125 MG SL SUBL: 0.1250 mg | SUBLINGUAL_TABLET | SUBLINGUAL | 1 refills | Status: DC | PRN

## 2023-03-10 MED ORDER — PROPOFOL 10 MG/ML IV BOLUS
INTRAVENOUS | Status: DC | PRN
  Administered 2023-03-10: 100 mg via INTRAVENOUS
  Administered 2023-03-10: 50 mg via INTRAVENOUS

## 2023-03-10 MED ORDER — PHENYLEPHRINE 80 MCG/ML (10ML) SYRINGE FOR IV PUSH (FOR BLOOD PRESSURE SUPPORT)
PREFILLED_SYRINGE | INTRAVENOUS | Status: DC | PRN
  Administered 2023-03-10: 80 ug via INTRAVENOUS
  Administered 2023-03-10: 160 ug via INTRAVENOUS
  Administered 2023-03-10: 80 ug via INTRAVENOUS
  Administered 2023-03-10: 160 ug via INTRAVENOUS

## 2023-03-10 MED ORDER — LACTATED RINGERS IV SOLN: INTRAVENOUS | Status: DC

## 2023-03-10 MED ORDER — DEXMEDETOMIDINE HCL IN NACL 80 MCG/20ML IV SOLN
INTRAVENOUS | Status: DC | PRN
  Administered 2023-03-10: 4 ug via INTRAVENOUS

## 2023-03-10 MED ORDER — LIDOCAINE HCL (PF) 2 % IJ SOLN
INTRAMUSCULAR | Status: AC
  Filled 2023-03-10: qty 15

## 2023-03-10 MED ORDER — DEXMEDETOMIDINE HCL IN NACL 80 MCG/20ML IV SOLN
INTRAVENOUS | Status: AC
  Filled 2023-03-10: qty 20

## 2023-03-10 MED ORDER — PROPOFOL 500 MG/50ML IV EMUL
INTRAVENOUS | Status: DC | PRN
  Administered 2023-03-10: 125 ug/kg/min via INTRAVENOUS

## 2023-03-10 NOTE — Anesthesia Postprocedure Evaluation (Signed)
Anesthesia Post Note  Patient: Beth Hamilton  Procedure(s) Performed: COLONOSCOPY WITH PROPOFOL BIOPSY POLYPECTOMY  Patient location during evaluation: Phase II Anesthesia Type: General Level of consciousness: awake Pain management: pain level controlled Vital Signs Assessment: post-procedure vital signs reviewed and stable Respiratory status: spontaneous breathing and respiratory function stable Cardiovascular status: blood pressure returned to baseline and stable Postop Assessment: no headache and no apparent nausea or vomiting Anesthetic complications: no Comments: Late entry   No notable events documented.   Last Vitals:  Vitals:   03/10/23 0657 03/10/23 0822  BP: (!) 157/84 (!) 99/48  Pulse: 93 71  Resp: 18 16  Temp: 37.2 C 36.5 C  SpO2: 98% 96%    Last Pain:  Vitals:   03/10/23 0822  TempSrc: Oral  PainSc: 0-No pain                 Windell Norfolk

## 2023-03-10 NOTE — Anesthesia Preprocedure Evaluation (Signed)
Anesthesia Evaluation  Patient identified by MRN, date of birth, ID band Patient awake    Reviewed: Allergy & Precautions, H&P , NPO status , Patient's Chart, lab work & pertinent test results, reviewed documented beta blocker date and time   History of Anesthesia Complications (+) history of anesthetic complications  Airway Mallampati: II  TM Distance: >3 FB Neck ROM: full    Dental no notable dental hx.    Pulmonary neg pulmonary ROS, pneumonia, former smoker   Pulmonary exam normal breath sounds clear to auscultation       Cardiovascular Exercise Tolerance: Good hypertension, negative cardio ROS  Rhythm:regular Rate:Normal     Neuro/Psych negative neurological ROS  negative psych ROS   GI/Hepatic negative GI ROS, Neg liver ROS, PUD,,,  Endo/Other  negative endocrine ROSdiabetesHypothyroidism    Renal/GU negative Renal ROS  negative genitourinary   Musculoskeletal   Abdominal   Peds  Hematology negative hematology ROS (+)   Anesthesia Other Findings   Reproductive/Obstetrics negative OB ROS                             Anesthesia Physical Anesthesia Plan  ASA: 2  Anesthesia Plan: General   Post-op Pain Management:    Induction:   PONV Risk Score and Plan: Propofol infusion  Airway Management Planned:   Additional Equipment:   Intra-op Plan:   Post-operative Plan:   Informed Consent: I have reviewed the patients History and Physical, chart, labs and discussed the procedure including the risks, benefits and alternatives for the proposed anesthesia with the patient or authorized representative who has indicated his/her understanding and acceptance.     Dental Advisory Given  Plan Discussed with: CRNA  Anesthesia Plan Comments:        Anesthesia Quick Evaluation

## 2023-03-10 NOTE — Transfer of Care (Addendum)
Immediate Anesthesia Transfer of Care Note  Patient: Beth Hamilton  Procedure(s) Performed: COLONOSCOPY WITH PROPOFOL BIOPSY POLYPECTOMY  Patient Location: PACU and Short Stay  Anesthesia Type:General  Level of Consciousness: awake, drowsy, and patient cooperative  Airway & Oxygen Therapy: Patient Spontanous Breathing and Patient connected to nasal cannula oxygen  Post-op Assessment: Report given to RN and Post -op Vital signs reviewed and stable  Post vital signs: Reviewed and stable  Last Vitals:  Vitals Value Taken Time  BP 99/48 03/10/23   0822  Temp 36.5 03/10/23   0822  Pulse 71 03/10/23   0822  Resp 16 03/10/23   0822  SpO2 96% 03/09/21   0822    Last Pain:  Vitals:   03/10/23 0749  TempSrc:   PainSc: 0-No pain      Patients Stated Pain Goal: 6 (03/10/23 0657)  Complications: No notable events documented.

## 2023-03-10 NOTE — Telephone Encounter (Signed)
-----   Message from Eula Listen sent at 03/10/2023  8:39 AM EDT ----- Needs a new prescription Levsin 0.125 mg sublingual tablets.  Dispense 30 with 1 refill.  Take 1 sublingually before meals and as needed for abdominal cramps and urgency.

## 2023-03-10 NOTE — Discharge Instructions (Addendum)
  Colonoscopy Discharge Instructions  Read the instructions outlined below and refer to this sheet in the next few weeks. These discharge instructions provide you with general information on caring for yourself after you leave the hospital. Your doctor may also give you specific instructions. While your treatment has been planned according to the most current medical practices available, unavoidable complications occasionally occur. If you have any problems or questions after discharge, call Dr. Jena Gauss at [phone]. ACTIVITY You may resume your regular activity, but move at a slower pace for the next 24 hours.  Take frequent rest periods for the next 24 hours.  Walking will help get rid of the air and reduce the bloated feeling in your belly (abdomen).  No driving for 24 hours (because of the medicine (anesthesia) used during the test).   Do not sign any important legal documents or operate any machinery for 24 hours (because of the anesthesia used during the test).  NUTRITION Drink plenty of fluids.  You may resume your normal diet as instructed by your doctor.  Begin with a light meal and progress to your normal diet. Heavy or fried foods are harder to digest and may make you feel sick to your stomach (nauseated).  Avoid alcoholic beverages for 24 hours or as instructed.  MEDICATIONS You may resume your normal medications unless your doctor tells you otherwise.  WHAT YOU CAN EXPECT TODAY Some feelings of bloating in the abdomen.  Passage of more gas than usual.  Spotting of blood in your stool or on the toilet paper.  IF YOU HAD POLYPS REMOVED DURING THE COLONOSCOPY: No aspirin products for 7 days or as instructed.  No alcohol for 7 days or as instructed.  Eat a soft diet for the next 24 hours.  FINDING OUT THE RESULTS OF YOUR TEST Not all test results are available during your visit. If your test results are not back during the visit, make an appointment with your caregiver to find out the  results. Do not assume everything is normal if you have not heard from your caregiver or the medical facility. It is important for you to follow up on all of your test results.  SEEK IMMEDIATE MEDICAL ATTENTION IF: You have more than a spotting of blood in your stool.  Your belly is swollen (abdominal distention).  You are nauseated or vomiting.  You have a temperature over 101.  You have abdominal pain or discomfort that is severe or gets worse throughout the day.     Active inflammation limited to the rectum and the sigmoid colon.  Inflammation is more localized today as compared to  2021  Polyp removed.  Multiple biopsies taken.  Continue Stelara  Further recommendations to follow pending review of pathology report  Add Levsin sublingual tablets 1 under the tongue before meals as needed for abdominal urgency and loose bowels  Office visit with Tana Coast in 1 month  OFFICE WILL CALL WITH APPOINTMENT  At patient request, I called Noreene Larsson at [phone] -reviewed findings and recommendations

## 2023-03-10 NOTE — Op Note (Signed)
[facility] Patient Name: Beth Hamilton Procedure Date: 03/10/2023 7:08 AM MRN: [id number] Date of Birth: 1956/02/03 Attending MD: Gennette Pac , MD, [id number] CSN: [id number] Age: 67 Admit Type: Outpatient Procedure:                Colonoscopy Indications:              High risk colon cancer surveillance: Ulcerative                            left sided colitis of 8 (or more) years duration Providers:                Gennette Pac, MD, Sheran Fava, Yolanda Bonine Referring MD:              Medicines:                Propofol per Anesthesia Complications:            No immediate complications. Estimated Blood Loss:     Estimated blood loss was minimal. Procedure:                Pre-Anesthesia Assessment:                           - Prior to the procedure, a History and Physical                            was performed, and patient medications and                            allergies were reviewed. The patient's tolerance of                            previous anesthesia was also reviewed. The risks                            and benefits of the procedure and the sedation                            options and risks were discussed with the patient.                            All questions were answered, and informed consent                            was obtained. Prior Anticoagulants: The patient has                            taken no anticoagulant or antiplatelet agents. ASA                            Grade Assessment: II - A patient with mild systemic  disease. After reviewing the risks and benefits,                            the patient was deemed in satisfactory condition to                            undergo the procedure.                           After obtaining informed consent, the colonoscope                            was passed under direct vision. Throughout the                             procedure, the patient's blood pressure, pulse, and                            oxygen saturations were monitored continuously. The                            [id number] scope was introduced through the                            anus and advanced to the 5 cm into the ileum. The                            colonoscopy was performed without difficulty. The                            patient tolerated the procedure well. The quality                            of the bowel preparation was adequate. The terminal                            ileum, ileocecal valve, appendiceal orifice, and                            rectum were photographed. The colonoscopy was                            performed without difficulty. The entire colon was                            well visualized. Scope In: 7:55:43 AM Scope Out: 8:16:22 AM Scope Withdrawal Time: 0 hours 16 minutes 30 seconds  Total Procedure Duration: 0 hours 20 minutes 39 seconds  Findings:      The perianal and digital rectal examinations were normal. Rectal mucosa       diffusely abnormal with marked granularity of the entire rectal mucosa       with total loss of the normal vascular pattern; rectosigmoid segment       examination revealed similar granularity as seen in the rectum with  geographic ulceration present. These changes extended throughout the       sigmoid. There were multiple pseudopolyps in the mid sigmoid. One was       large - approximately 1.25 cm; inflammatory changes tapered off abruptly       at the junction of the sigmoid and descending segment. From this level,       to the cecum, the colonic mucosa appeared endoscopically normal. Distal       5 cm of terminal ileum appeared normal. Segmental frequent biopsies of       the ascending, transverse, descending, sigmoid and rectal mucosa were       taken for histologic study. The largest polyp in the sigmoid segment was       hot snare removed and  recovered. Impression:               - Active inflammatory bowel disease involving the                            rectum and sigmoid segment with pseudopolyp                            formation?"sigmoid. Largest polyp removed.                           -Proximal to the sigmoid segment, the colonic                            mucosa appeared normal. Normal-appearing terminal                            ileum. Status post segmental biopsy.                            Normal-appearing terminal ileum. Moderate Sedation:      Moderate (conscious) sedation was personally administered by an       anesthesia professional. The following parameters were monitored: oxygen       saturation, heart rate, blood pressure, respiratory rate, EKG, adequacy       of pulmonary ventilation, and response to care. Recommendation:           - Patient has a contact number available for                            emergencies. The signs and symptoms of potential                            delayed complications were discussed with the                            patient. Return to normal activities tomorrow.                            Written discharge instructions were provided to the                            patient.                           -  Advance diet as tolerated. Continue Stelara. Will                            add Levsin sublingual 0.125 mg before meals for                            continued fecal urgency.                           -Follow-up on pathology. Office visit with [place] in 1                            month. Procedure Code(s):        --- Professional ---                           [phone], Colonoscopy, flexible; diagnostic, including                            collection of specimen(s) by brushing or washing,                            when performed (separate procedure) Diagnosis Code(s):        --- Professional ---                           K51.50, Left sided colitis without complications CPT  copyright 2022 American Medical Association. All rights reserved. The codes documented in this report are preliminary and upon coder review may  be revised to meet current compliance requirements. Gerrit Friends. Jaaliyah Lucatero, MD Gennette Pac, MD 03/10/2023 8:39:10 AM This report has been signed electronically. Number of Addenda: 0

## 2023-03-10 NOTE — H&P (Signed)
@LOGO @   Primary Care Physician:  Babs Sciara, MD Primary Gastroenterologist:  Dr. Jena Gauss  Pre-Procedure History & Physical: HPI:  Beth Hamilton is a 67 y.o. female here for surveillance colonoscopy 15+ year history of inflammatory bowel disease initially phenotypically was indeterminate colitis over the years that morphed into left-sided ulcerative colitis.  He is previously on Humira.  Disease escaped control with this agent.  Transiently on Imuran as well but that was stopped because of nausea and vomiting most recently begun on Stelara.  After 3 doses of Stelara she has noted improvement that exceeds the effects of Humira.  3-4 bowel movements daily they are formed stool with some mucus no bleeding.  Has significant urgency.  Typically occur sometime after she eats.  Past Medical History:  Diagnosis Date   Breast cancer (HCC) 04/20/14   left breast   Complication of anesthesia    not sure what med was given in 96 with D&C, was very sore all over. Dr Jayme Cloud did anesthesia. Dr Kate Sable did surgery   Gout    Hemorrhoids    History of breast cancer 05/19/2015   Hypertension    Hypothyroidism    IBD (inflammatory bowel disease)    UC   Personal history of radiation therapy    2015   Pneumonia    Psoriasis    Thyroid disease    Ulcerative colitis (HCC) 5 1 2007   Dr Jena Gauss    Past Surgical History:  Procedure Laterality Date   BIOPSY  08/02/2019   Procedure: BIOPSY;  Surgeon: Corbin Ade, MD;  Location: [facility];  Service: Endoscopy;;  colon   BREAST BIOPSY     BREAST LUMPECTOMY Left 04/20/2014   COLONOSCOPY  10/09/2001   RMR: Internal hemorrhoids and anal papilla; otherwise normal rectum   COLONOSCOPY  12/10/2005   incomplete tcs-  colitis   COLONOSCOPY N/A 04/18/2014   Dr.Burl Tauzin- abnormal rectum & L colon into the mid descending segment. mucosa was erythematious as well as pale w/ some loss of normal vascular apttern. no erosions or ulcers. pt had some smooth  peduculated 3-4cm polyps in the sigmoid segment most c/w pseudopolyps. the rest of the colonic mucosa appeared normal. bx= inflammatory polyps   COLONOSCOPY WITH PROPOFOL N/A 08/02/2019   Procedure: COLONOSCOPY WITH PROPOFOL;  Surgeon: Corbin Ade, MD;  Location: [facility];  Service: Endoscopy;  Laterality: N/A;  8:30am   DILATION AND CURETTAGE OF UTERUS  1996   Dr Mearl Latin SIGMOIDOSCOPY  02/26/2006   endoscopically normal-appearing rectum, colitis in sigmoid mucosa   PARTIAL MASTECTOMY WITH NEEDLE LOCALIZATION AND AXILLARY SENTINEL LYMPH NODE BX Left 04/20/2014   Procedure: PARTIAL MASTECTOMY AFTER NEEDLE LOCALIZATION AND AXILLARY SENTINEL LYMPH NODE BX;  Surgeon: Dalia Heading, MD;  Location: [facility];  Service: General;  Laterality: Left;   TUBAL LIGATION     WISDOM TOOTH EXTRACTION      Prior to Admission medications   Medication Sig Start Date End Date Taking? Authorizing Provider  allopurinol (ZYLOPRIM) 300 MG tablet TAKE 1 TABLET BY MOUTH EVERY DAY 10/31/22  Yes Luking, Scott A, MD  amLODipine (NORVASC) 5 MG tablet Take 1 tablet (5 mg total) by mouth daily. 10/28/22  Yes Babs Sciara, MD  anastrozole (ARIMIDEX) 1 MG tablet Take 1 tablet (1 mg total) by mouth daily. 08/20/22  Yes Doreatha Massed, MD  ARMOUR THYROID 60 MG tablet Take 60 mg by mouth daily. 1/2 pill on Sat and Sun.  11/16/18  Yes [provider]  B Complex Vitamins (VITAMIN-B COMPLEX PO) Take 1 mL by mouth daily. Vit b12   Yes [provider]  Calcium Citrate-Vitamin D (CITRACAL + D PO) Take by mouth.   Yes [provider]  fluticasone (CUTIVATE) 0.05 % cream APPLY TO AFFECTED AREAS TWICE DAILY. Patient taking differently: as needed. APPLY TO AFFECTED AREAS TWICE DAILY. 12/18/18  Yes Babs Sciara, MD  folic acid (FOLVITE) 400 MCG tablet Take 400 mcg by mouth daily.    Yes [provider]  lisinopril-hydrochlorothiazide (ZESTORETIC) 20-25 MG tablet TAKE 1 TABLET BY MOUTH  EVERY DAY 01/27/23  Yes Luking, Scott A, MD  rosuvastatin (CRESTOR) 5 MG tablet TAKE 1 TABLET (5 MG TOTAL) BY MOUTH DAILY. 03/03/23  Yes Babs Sciara, MD  acyclovir ointment (ZOVIRAX) 5 % Apply 1 Application topically every 4 (four) hours as needed. 09/10/22   Babs Sciara, MD  metFORMIN (GLUCOPHAGE) 500 MG tablet TAKE 1 TABLET BY MOUTH EVERY DAY 03/03/23   Luking, Jonna Coup, MD  STELARA 90 MG/ML SOSY injection Inject 90 mg into the skin as directed. Every 8 weeks. 11/29/22   [provider]    Allergies as of 02/04/2023 - Review Complete 12/27/2022  Allergen Reaction Noted   Flagyl [metronidazole]  10/29/2013   Other Other (See Comments) 03/22/2014   Levaquin [levofloxacin in d5w]  07/23/2013    Family History  Problem Relation Age of Onset   Stroke Maternal Grandfather    Heart attack Maternal Grandfather    Hypertension Mother    Diabetes Paternal Grandfather    Heart disease Paternal Grandfather    Colon cancer Neg Hx     Social History   Socioeconomic History   Marital status: Legally Separated    Spouse name: Not on file   Number of children: 1   Years of education: Not on file   Highest education level: Not on file  Occupational History   Not on file  Tobacco Use   Smoking status: Former    Current packs/day: 0.00    Types: Cigarettes    Quit date: 07/26/1990    Years since quitting: 32.6   Smokeless tobacco: Never   Tobacco comments:    quit in 2007  Vaping Use   Vaping status: Never Used  Substance and Sexual Activity   Alcohol use: Yes    Alcohol/week: 14.0 standard drinks of alcohol    Types: 14 Glasses of wine per week    Comment: wine every day   Drug use: No   Sexual activity: Not Currently    Birth control/protection: Post-menopausal, Abstinence, Surgical    Comment: tubal  Other Topics Concern   Not on file  Social History Narrative   Not on file   Social Determinants of Health   Financial Resource Strain: Low Risk  (05/17/2022)    Overall Financial Resource Strain (CARDIA)    Difficulty of Paying Living Expenses: Not hard at all  Food Insecurity: No Food Insecurity (05/17/2022)   Hunger Vital Sign    Worried About Running Out of Food in the Last Year: Never true    Ran Out of Food in the Last Year: Never true  Transportation Needs: No Transportation Needs (05/17/2022)   PRAPARE - Administrator, Civil Service (Medical): No    Lack of Transportation (Non-Medical): No  Physical Activity: Insufficiently Active (05/17/2022)   Exercise Vital Sign    Days of Exercise per Week: 3 days  Minutes of Exercise per Session: 10 min  Stress: No Stress Concern Present (05/17/2022)   [employer] of Occupational Health - Occupational Stress Questionnaire    Feeling of Stress : Not at all  Social Connections: Moderately Isolated (05/17/2022)   Social Connection and Isolation Panel [NHANES]    Frequency of Communication with Friends and Family: More than three times a week    Frequency of Social Gatherings with Friends and Family: Once a week    Attends Religious Services: More than 4 times per year    Active Member of [employer] or Organizations: No    Attends Banker Meetings: Never    Marital Status: Separated  Intimate Partner Violence: Not At Risk (05/17/2022)   Humiliation, Afraid, Rape, and Kick questionnaire    Fear of Current or Ex-Partner: No    Emotionally Abused: No    Physically Abused: No    Sexually Abused: No    Review of Systems: See HPI, otherwise negative ROS  Physical Exam: BP (!) 157/84   Pulse 93   Temp 98.9 F (37.2 C) (Oral)   Resp 18   Ht 5\' 4"  (1.626 m)   Wt 77.1 kg   SpO2 98%   BMI 29.18 kg/m  General:   Alert,  Well-developed, well-nourished, pleasant and cooperative in NAD Neck:  Supple; no masses or thyromegaly. No significant cervical adenopathy. Lungs:  Clear throughout to auscultation.   No wheezes, crackles, or rhonchi. No acute distress. Heart:   Regular rate and rhythm; no murmurs, clicks, rubs,  or gallops. Abdomen: Non-distended, normal bowel sounds.  Soft and nontender without appreciable mass or hepatosplenomegaly.   Impression/Plan: 67 year old lady with 15+ year history of left-sided proctocolitis.  She is here for surveillance colonoscopy.  Stelara has improved her bowel symptoms.  Looks like she started on Glucophage recently. Surveillance colonoscopy today per plan. The risks, benefits, limitations, alternatives and imponderables have been reviewed with the patient. Questions have been answered. All parties are agreeable.       Notice: This dictation was prepared with Dragon dictation along with smaller phrase technology. Any transcriptional errors that result from this process are unintentional and may not be corrected upon review.

## 2023-03-11 ENCOUNTER — Encounter: Payer: Self-pay | Admitting: Family Medicine

## 2023-03-11 ENCOUNTER — Telehealth (INDEPENDENT_AMBULATORY_CARE_PROVIDER_SITE_OTHER): Payer: BC Managed Care – PPO | Admitting: Family Medicine

## 2023-03-11 DIAGNOSIS — E1169 Type 2 diabetes mellitus with other specified complication: Secondary | ICD-10-CM

## 2023-03-11 DIAGNOSIS — M10079 Idiopathic gout, unspecified ankle and foot: Secondary | ICD-10-CM

## 2023-03-11 DIAGNOSIS — E119 Type 2 diabetes mellitus without complications: Secondary | ICD-10-CM

## 2023-03-11 DIAGNOSIS — E785 Hyperlipidemia, unspecified: Secondary | ICD-10-CM | POA: Diagnosis not present

## 2023-03-11 DIAGNOSIS — Z79899 Other long term (current) drug therapy: Secondary | ICD-10-CM

## 2023-03-11 DIAGNOSIS — I10 Essential (primary) hypertension: Secondary | ICD-10-CM

## 2023-03-11 DIAGNOSIS — Z7984 Long term (current) use of oral hypoglycemic drugs: Secondary | ICD-10-CM

## 2023-03-11 LAB — SURGICAL PATHOLOGY

## 2023-03-11 MED ORDER — AMLODIPINE BESYLATE 5 MG PO TABS
5.0000 mg | ORAL_TABLET | Freq: Every day | ORAL | 0 refills | Status: DC
Start: 2023-03-11 — End: 2023-07-21

## 2023-03-11 MED ORDER — ALLOPURINOL 300 MG PO TABS
300.0000 mg | ORAL_TABLET | Freq: Every day | ORAL | 1 refills | Status: DC
Start: 2023-03-11 — End: 2023-10-28

## 2023-03-11 MED ORDER — LISINOPRIL-HYDROCHLOROTHIAZIDE 20-25 MG PO TABS
1.0000 | ORAL_TABLET | Freq: Every day | ORAL | 1 refills | Status: DC
Start: 2023-03-11 — End: 2023-10-28

## 2023-03-11 NOTE — Progress Notes (Addendum)
   Subjective:    Patient ID: Beth Hamilton, female    DOB: 03/11/1956, 67 y.o.   MRN: [id number] Virtual Visit via Video Note  I connected with Beth Hamilton on 03/11/23 at  8:20 AM EDT by a video enabled telemedicine application and verified that I am speaking with the correct person using two identifiers.  Location: Patient: Work Provider: Home office   I discussed the limitations of evaluation and management by telemedicine and the availability of in person appointments. The patient expressed understanding and agreed to proceed.  History of Present Illness:    Observations/Objective:   Assessment and Plan:   Follow Up Instructions:    I discussed the assessment and treatment plan with the patient. The patient was provided an opportunity to ask questions and all were answered. The patient agreed with the plan and demonstrated an understanding of the instructions.   The patient was advised to call back or seek an in-person evaluation if the symptoms worsen or if the condition fails to improve as anticipated.  I provided 15 minutes of non-face-to-face time during this encounter.   Lilyan Punt, MD  HPI  Patient for blood pressure check up.  The patient does have hypertension.   Patient relates dietary measures try to minimize salt The importance of healthy diet and activity were discussed Patient relates compliance  The patient was seen today as part of a comprehensive diabetic check up. Patient has diabetes Patient relates good compliance with taking the medication. We discussed their diet and exercise activities  We also discussed the importance of notifying us if any excessively high glucoses or low sugars.   Gout  Patient with underlying symptoms consistent with gout Follow-up  Having any current symptoms of gout flareup(if so what)?-None Relates that they are taking their medications, following a low purine diet, not having any problems with medicine,  doing regular lab work as directed  Patient recently had her colonoscopy she did well with this.  Does show signs of colitis.  Being treated with Stelara by specialist Not having any setbacks or problems other than sometimes having quick loose bowel movements Gastroenterology working with her  Recent blood work showed a false elevated potassium but follow-up blood work looked good   Review of Systems     Objective:   Physical Exam Patient had virtual visit-video Appears to be in no distress Atraumatic Neuro able to relate and oriented No apparent resp distress Color normal        Assessment & Plan:  1. HTN (hypertension), benign On recent visits with gastroenterology these are doing well continue current medicine - amLODipine (NORVASC) 5 MG tablet; Take 1 tablet (5 mg total) by mouth daily.  Dispense: 90 tablet; Refill: 0 - lisinopril-hydrochlorothiazide (ZESTORETIC) 20-25 MG tablet; Take 1 tablet by mouth daily.  Dispense: 90 tablet; Refill: 1  3. Diabetes mellitus without complication (HCC) Will check A1c continue metformin continue healthy diet  4. Hyperlipidemia associated with type 2 diabetes mellitus (HCC) Continue cholesterol medicine check lipid profile  5. Idiopathic gout involving toe, unspecified chronicity, unspecified laterality Will check uric acid level. - allopurinol (ZYLOPRIM) 300 MG tablet; Take 1 tablet (300 mg total) by mouth daily.  Dispense: 90 tablet; Refill: 1

## 2023-03-17 ENCOUNTER — Encounter (HOSPITAL_COMMUNITY): Payer: Self-pay | Admitting: Internal Medicine

## 2023-04-05 LAB — LIPID PANEL
Chol/HDL Ratio: 2.8 ratio (ref 0.0–4.4)
Cholesterol, Total: 168 mg/dL (ref 100–199)
HDL: 60 mg/dL (ref >39)
LDL Chol Calc (NIH): 87 mg/dL (ref 0–99)
Triglycerides: 116 mg/dL (ref 0–149)
VLDL Cholesterol Cal: 21 mg/dL (ref 5–40)

## 2023-04-05 LAB — HEPATIC FUNCTION PANEL
ALT: 21 IU/L (ref 0–32)
AST: 19 IU/L (ref 0–40)
Albumin: 4.4 g/dL (ref 3.9–4.9)
Alkaline Phosphatase: 77 IU/L (ref 44–121)
Bilirubin Total: 0.7 mg/dL (ref 0.0–1.2)
Bilirubin, Direct: 0.16 mg/dL (ref 0.00–0.40)
Total Protein: 7.6 g/dL (ref 6.0–8.5)

## 2023-04-05 LAB — HEMOGLOBIN A1C
Est. average glucose Bld gHb Est-mCnc: 131 mg/dL
Hgb A1c MFr Bld: 6.2 % — ABNORMAL HIGH (ref 4.8–5.6)

## 2023-04-05 LAB — URIC ACID: Uric Acid: 3 mg/dL (ref 3.0–7.2)

## 2023-04-05 LAB — MICROALBUMIN / CREATININE URINE RATIO
Creatinine, Urine: 76 mg/dL
Microalb/Creat Ratio: 6 mg/g{creat} (ref 0–29)
Microalbumin, Urine: 4.7 ug/mL

## 2023-04-06 ENCOUNTER — Encounter: Payer: Self-pay | Admitting: Family Medicine

## 2023-04-06 NOTE — Progress Notes (Signed)
Please mail to patient

## 2023-04-06 NOTE — Progress Notes (Signed)
GI Office Note    Referring Provider: Babs Sciara, MD Primary Care Physician:  Babs Sciara, MD  Primary Gastroenterologist: Roetta Sessions, MD   Chief Complaint   Chief Complaint  Patient presents with   Follow-up    Doing well, no issues    History of Present Illness   Beth Hamilton is a 67 y.o. female presenting today for follow up. H/o indeterminate/ulcerative colitis. Last seen in 12/2022.   Had breakthrough symptoms last year on Humira, on weekly dosing. Drug level 6.1 with low titer antibodies of 62. We considered switching to [facility] but she was not interested after reading up on the medication. She was continued on Humira but azathioprine was added at 25mg  daily (lower dose due to coadministration with allopurinol). TMPT metabolite assay unremarkable. Initially tolerated azathioprine but eventually developed N/V and medication had to be stopped.    She received Stelara infusion 10/17/22. She received her first SQ injection 8 weeks later.    Her insurance covers adalimumab-adaz, Hyrimoz, Rinvoq, Stelara (SQ), Xeljanz/XR, Skyrizi (SQ) or Zeposia.    Today: overall has noted some improvement on Stelara. She no longer has low grade fevers and generally feels better. She still continues to have frequent stools, fecal urgency with episodes of incontinence. Fecal urgency and incontinence have been more of an issue the last few months. Stools are not watery but very loose. She passing mucous at least once during the night. Notes most of her stools are in the morning. She may have two stools before leaving for work. She takes levsin before leaving for work due to 30 minute drive. She may have another two BMs at work and 1-2 after getting back home. Right now taking levsin before breakfast and at lunch. Still has scant amount of blood in the stool. Going on vacation this weekend and worried about her diarrhea/urgency. Also notes that recently started on metformin by pcp.     Colonoscopy 02/2023: -active inflammatory bowel disease involving the rectum and sigmoid segment with pseudopolyp formation sigmoid, largest polyp removed -proximal to sigmoid segment, colonic mucosa appeared normal. Normal TI. S/p segmental bx.  FINAL MICROSCOPIC DIAGNOSIS:  A. COLON, ASCENDING, BIOPSY: - Colonic mucosa with no specific histopathologic changes - Negative for acute inflammation, features of chronicity, granulomas or dysplasia  B. COLON, TRANSVERSE, BIOPSY: - Colonic mucosa with no specific histopathologic changes - Negative for acute inflammation, features of chronicity, granulomas or dysplasia  C. COLON, DESCENDING, BIOPSY: - Colonic mucosa with no specific histopathologic changes - Negative for acute inflammation, features of chronicity, granulomas or dysplasia  D. COLON, SIGMOID, POLYPECTOMY: - Inflammatory pseudopolyp - Negative for dysplasia  E. COLON, SIGMOID, BIOPSY: - Severely active chronic colitis with erosion/ulcer, consistent with patient's clinical history of Ulcerative Colitis - Negative for granulomas or dysplasia  F. RECTUM, BIOPSY: - Mildly active chronic proctitis, consistent with patient's clinical history of ulcerative colitis - Negative for granulomas or dysplasia    Medications   Current Outpatient Medications  Medication Sig Dispense Refill   acyclovir ointment (ZOVIRAX) 5 % Apply 1 Application topically every 4 (four) hours as needed. 30 g 3   alendronate (FOSAMAX) 70 MG tablet PLEASE SEE ATTACHED FOR DETAILED DIRECTIONS     allopurinol (ZYLOPRIM) 300 MG tablet Take 1 tablet (300 mg total) by mouth daily. 90 tablet 1   amLODipine (NORVASC) 5 MG tablet Take 1 tablet (5 mg total) by mouth daily. 90 tablet 0   anastrozole (ARIMIDEX) 1 MG tablet Take  1 tablet (1 mg total) by mouth daily. 90 tablet 3   ARMOUR THYROID 60 MG tablet Take 60 mg by mouth daily. 1/2 pill on Sat and Sun.     augmented betamethasone dipropionate  (DIPROLENE-AF) 0.05 % cream Apply topically.     B Complex Vitamins (VITAMIN-B COMPLEX PO) Take 1 mL by mouth daily. Vit b12     Calcium Citrate-Vitamin D (CITRACAL + D PO) Take by mouth.     fluticasone (CUTIVATE) 0.05 % cream APPLY TO AFFECTED AREAS TWICE DAILY. (Patient taking differently: as needed. APPLY TO AFFECTED AREAS TWICE DAILY.) 60 g 1   folic acid (FOLVITE) 400 MCG tablet Take 400 mcg by mouth daily.      hyoscyamine (LEVSIN/SL) 0.125 MG SL tablet Place 1 tablet (0.125 mg total) under the tongue every 4 (four) hours as needed for cramping. 30 tablet 1   lisinopril-hydrochlorothiazide (ZESTORETIC) 20-25 MG tablet Take 1 tablet by mouth daily. 90 tablet 1   metFORMIN (GLUCOPHAGE) 500 MG tablet TAKE 1 TABLET BY MOUTH EVERY DAY 90 tablet 1   rosuvastatin (CRESTOR) 5 MG tablet TAKE 1 TABLET (5 MG TOTAL) BY MOUTH DAILY. 90 tablet 1   STELARA 90 MG/ML SOSY injection Inject 90 mg into the skin as directed. Every 8 weeks.     No current facility-administered medications for this visit.    Allergies   Allergies as of 04/07/2023 - Review Complete 04/07/2023  Allergen Reaction Noted   Flagyl [metronidazole]  10/29/2013   Other Other (See Comments) 03/22/2014   Levaquin [levofloxacin in d5w]  07/23/2013      Review of Systems   General: Negative for anorexia, weight loss, fever, chills, fatigue, weakness. ENT: Negative for hoarseness, difficulty swallowing, nasal congestion. CV: Negative for chest pain, angina, palpitations, dyspnea on exertion, peripheral edema.  Respiratory: Negative for dyspnea at rest, dyspnea on exertion, cough, sputum, wheezing.  GI: See history of present illness. GU:  Negative for dysuria, hematuria, urinary incontinence, urinary frequency, nocturnal urination.  Endo: Negative for unusual weight change.     Physical Exam   BP 135/77 (BP Location: Right Arm, Patient Position: Sitting, Cuff Size: Normal)   Pulse 91   Temp 98.8 F (37.1 C) (Oral)   Ht 5'  4" (1.626 m)   Wt 176 lb (79.8 kg)   SpO2 98%   BMI 30.21 kg/m    General: Well-nourished, well-developed in no acute distress.  Eyes: No icterus. Mouth: Oropharyngeal mucosa moist and pink  Abdomen: Bowel sounds are normal, nontender, nondistended, no hepatosplenomegaly or masses,  no abdominal bruits or hernia , no rebound or guarding.  Rectal: not performed  Extremities: No lower extremity edema. No clubbing or deformities. Neuro: Alert and oriented x 4   Skin: Warm and dry, no jaundice.   Psych: Alert and cooperative, normal mood and affect.  Labs   Lab Results  Component Value Date   ALT 21 04/04/2023   AST 19 04/04/2023   ALKPHOS 77 04/04/2023   BILITOT 0.7 04/04/2023   Lab Results  Component Value Date   NA 140 03/10/2023   CL 102 03/10/2023   K 3.5 03/10/2023   CO2 23 03/06/2023   BUN 7 (L) 03/10/2023   CREATININE 0.70 03/10/2023   EGFR 97 03/06/2023   CALCIUM 9.1 03/06/2023   ALBUMIN 4.4 04/04/2023   GLUCOSE 112 (H) 03/10/2023   Lab Results  Component Value Date   HGBA1C 6.2 (H) 04/04/2023   Lab Results  Component Value Date  WBC 8.6 08/20/2022   HGB 14.3 03/10/2023   HCT 42.0 03/10/2023   MCV 99 (H) 08/20/2022   PLT 253 08/20/2022    Imaging Studies   No results found.  Assessment   *Inflammatory bowel disease  Indeterminate/ulcerative colitis. Switched to [place], received induction infusion 09/2022, first [place] injection 11/2022, continues every 8 weeks. At time of colonoscopy, five months had lapsed since onset of Stelara. She has noted some improvement, overall feels better, no longer having low grade fevers. No blood in the stool. Still with frequent stools, biggest issue in urgency and episodes of incontinence which are newer symptoms.     PLAN   Continue Levsin TID prn. Short course of prednisone 40mg  daily for 5 days, then reduce by 10mg  every 7 days. To discuss UC treatment with Dr. Jena Gauss, regarding continuing Stelara or considering  switching medications at this time.   Leanna Battles. Melvyn Neth, MHS, PA-C [facility]

## 2023-04-07 ENCOUNTER — Ambulatory Visit: Payer: BC Managed Care – PPO | Admitting: Gastroenterology

## 2023-04-07 ENCOUNTER — Encounter: Payer: Self-pay | Admitting: Gastroenterology

## 2023-04-07 VITALS — BP 135/77 | HR 91 | Temp 98.8°F | Ht 64.0 in | Wt 176.0 lb

## 2023-04-07 DIAGNOSIS — K529 Noninfective gastroenteritis and colitis, unspecified: Secondary | ICD-10-CM | POA: Diagnosis not present

## 2023-04-07 MED ORDER — PREDNISONE 10 MG PO TABS: ORAL_TABLET | ORAL | 0 refills | Status: DC

## 2023-04-07 NOTE — Patient Instructions (Signed)
I have sent in short course of prednisone to [facility]. Start out taking 40mg  daily for 5 days, then taper by 10mg  every 7 days. Continue levsin under your tongue up to three times daily as needed. I will let you know if Dr. Jena Gauss is ok for more regular use.  We will be in touch with further recommendations regarding your ulcerative colitis treatment once discussed with Dr. Jena Gauss.

## 2023-04-08 ENCOUNTER — Other Ambulatory Visit: Payer: Self-pay

## 2023-04-08 MED ORDER — ROSUVASTATIN CALCIUM 10 MG PO TABS: 10.0000 mg | ORAL_TABLET | Freq: Every day | ORAL | 1 refills | Status: DC

## 2023-04-29 ENCOUNTER — Telehealth: Payer: Self-pay | Admitting: Gastroenterology

## 2023-04-29 NOTE — Telephone Encounter (Signed)
Please let pt know that I have discussed her case with Dr. Jena Gauss. Sorry for the delay, he was on vacation after her ov.  He advises: CRP SED RATE Fecal calprotectine   It is ok to continue Levsin TID prn urgent stools, spasms, I can send in RX if she would like.   Once lab/stool comes back he is going to advise on weather to continue Stelara or switch IBD medication.

## 2023-04-30 ENCOUNTER — Other Ambulatory Visit: Payer: Self-pay

## 2023-04-30 DIAGNOSIS — K5190 Ulcerative colitis, unspecified, without complications: Secondary | ICD-10-CM

## 2023-04-30 MED ORDER — HYOSCYAMINE SULFATE 0.125 MG SL SUBL: 0.1250 mg | SUBLINGUAL_TABLET | SUBLINGUAL | 2 refills | Status: DC | PRN

## 2023-04-30 NOTE — Telephone Encounter (Signed)
Pt has been made aware and verbalized understanding. Pt states that since she went on prednisone she hasn't had any problems. Pt states that she has since weaned her self off of it and has been off of prednisone since Monday. Pt states that she is unable to go for the labs/stools until 05/14/2023 due to work. Pt would like for the medication to be sent to the pharmacy on file.

## 2023-04-30 NOTE — Addendum Note (Signed)
Addended by: Tiffany Kocher on: 04/30/2023 12:23 PM   Modules accepted: Orders

## 2023-05-11 ENCOUNTER — Other Ambulatory Visit: Payer: Self-pay | Admitting: Gastroenterology

## 2023-05-14 ENCOUNTER — Ambulatory Visit (HOSPITAL_COMMUNITY)
Admission: RE | Admit: 2023-05-14 | Discharge: 2023-05-14 | Disposition: A | Discharge status: Home / Self Care | Payer: BC Managed Care – PPO | Source: Ambulatory Visit | Attending: Hematology | Admitting: Hematology

## 2023-05-14 ENCOUNTER — Inpatient Hospital Stay: Payer: BC Managed Care – PPO | Attending: Hematology

## 2023-05-14 ENCOUNTER — Other Ambulatory Visit (HOSPITAL_COMMUNITY)
Admission: RE | Admit: 2023-05-14 | Discharge: 2023-05-14 | Disposition: A | Discharge status: Home / Self Care | Payer: BC Managed Care – PPO | Source: Ambulatory Visit | Attending: Gastroenterology | Admitting: Gastroenterology

## 2023-05-14 DIAGNOSIS — C50512 Malignant neoplasm of lower-outer quadrant of left female breast: Secondary | ICD-10-CM | POA: Insufficient documentation

## 2023-05-14 DIAGNOSIS — Z170 Estrogen receptor positive status [ER+]: Secondary | ICD-10-CM | POA: Insufficient documentation

## 2023-05-14 DIAGNOSIS — R928 Other abnormal and inconclusive findings on diagnostic imaging of breast: Secondary | ICD-10-CM | POA: Insufficient documentation

## 2023-05-14 DIAGNOSIS — Z79899 Other long term (current) drug therapy: Secondary | ICD-10-CM | POA: Insufficient documentation

## 2023-05-14 DIAGNOSIS — M810 Age-related osteoporosis without current pathological fracture: Secondary | ICD-10-CM | POA: Insufficient documentation

## 2023-05-14 DIAGNOSIS — K5190 Ulcerative colitis, unspecified, without complications: Secondary | ICD-10-CM

## 2023-05-14 DIAGNOSIS — Z1231 Encounter for screening mammogram for malignant neoplasm of breast: Secondary | ICD-10-CM | POA: Insufficient documentation

## 2023-05-14 LAB — CBC WITH DIFFERENTIAL/PLATELET
Abs Immature Granulocytes: 0.04 10*3/uL (ref 0.00–0.07)
Basophils Absolute: 0.1 10*3/uL (ref 0.0–0.1)
Basophils Relative: 1 %
Eosinophils Absolute: 0.1 10*3/uL (ref 0.0–0.5)
Eosinophils Relative: 1 %
HCT: 40.7 % (ref 36.0–46.0)
Hemoglobin: 13.4 g/dL (ref 12.0–15.0)
Immature Granulocytes: 0 %
Lymphocytes Relative: 22 %
Lymphs Abs: 2 10*3/uL (ref 0.7–4.0)
MCH: 34.8 pg — ABNORMAL HIGH (ref 26.0–34.0)
MCHC: 32.9 g/dL (ref 30.0–36.0)
MCV: 105.7 fL — ABNORMAL HIGH (ref 80.0–100.0)
Monocytes Absolute: 0.7 10*3/uL (ref 0.1–1.0)
Monocytes Relative: 8 %
Neutro Abs: 6.2 10*3/uL (ref 1.7–7.7)
Neutrophils Relative %: 68 %
Platelets: 257 10*3/uL (ref 150–400)
RBC: 3.85 MIL/uL — ABNORMAL LOW (ref 3.87–5.11)
RDW: 14 % (ref 11.5–15.5)
WBC: 9.1 10*3/uL (ref 4.0–10.5)
nRBC: 0 % (ref 0.0–0.2)

## 2023-05-14 LAB — COMPREHENSIVE METABOLIC PANEL
ALT: 27 U/L (ref 0–44)
AST: 26 U/L (ref 15–41)
Albumin: 4 g/dL (ref 3.5–5.0)
Alkaline Phosphatase: 50 U/L (ref 38–126)
Anion gap: 13 (ref 5–15)
BUN: 13 mg/dL (ref 8–23)
CO2: 26 mmol/L (ref 22–32)
Calcium: 9.1 mg/dL (ref 8.9–10.3)
Chloride: 99 mmol/L (ref 98–111)
Creatinine, Ser: 0.71 mg/dL (ref 0.44–1.00)
GFR, Estimated: >60 mL/min (ref >60)
Glucose, Bld: 131 mg/dL — ABNORMAL HIGH (ref 70–99)
Potassium: 3.9 mmol/L (ref 3.5–5.1)
Sodium: 138 mmol/L (ref 135–145)
Total Bilirubin: 0.9 mg/dL (ref 0.3–1.2)
Total Protein: 7.5 g/dL (ref 6.5–8.1)

## 2023-05-14 LAB — VITAMIN D 25 HYDROXY (VIT D DEFICIENCY, FRACTURES): Vit D, 25-Hydroxy: 52.31 ng/mL (ref 30–100)

## 2023-05-16 ENCOUNTER — Other Ambulatory Visit (HOSPITAL_COMMUNITY): Payer: Self-pay | Admitting: Hematology

## 2023-05-16 DIAGNOSIS — R928 Other abnormal and inconclusive findings on diagnostic imaging of breast: Secondary | ICD-10-CM

## 2023-05-16 LAB — CALPROTECTIN, FECAL: Calprotectin, Fecal: 1000 ug/g — ABNORMAL HIGH (ref 0–120)

## 2023-05-19 NOTE — Progress Notes (Signed)
[facility] [address], [place] [address]    Clinic Day:  05/20/2023  Referring physician: Babs Sciara, MD  Patient Care Team: Babs Sciara, MD as PCP - General (Family Medicine) Jena Gauss Gerrit Friends, MD (Gastroenterology) Franky Macho, MD as Consulting Physician (General Surgery) Lazaro Arms, MD as Consulting Physician (Obstetrics and Gynecology)   ASSESSMENT & PLAN:   Assessment: 1.  Stage I (T1BN0) left breast cancer: -Status post left lumpectomy on 04/20/2014, 0.9 cm IDC, grade 1, ER/PR positive, HER-2 negative, Ki-67 of 10%. -Oncotype DX score of 7, underwent radiation therapy, started on anastrozole. -Patient opted to continue anastrozole beyond 5 years.   2.  Osteoporosis: - DEXA scan in 2015 showed osteoporosis. - Prolia from 12/30/2014 through 01/11/2021, self discontinued due to high cost. -DEXA scan in December 2017 showed T score of -2.4. -Repeat DEXA scan on 04/27/2019 showed T score of -2.1. - DEXA scan on 05/09/2022 with T score -3.0 - Fosamax 70 mg weekly started on 05/16/2022  Plan: 1.  Stage I (T1BN0) left breast cancer: - She is tolerating anastrozole very well. - Physical exam: Left breast upper inner quadrant scar is stable.  No palpable mass in bilateral breast.  No palpable adenopathy. - Labs from 05/14/2023: Normal LFTs.  CBC grossly normal. - Screening mammogram on 05/14/2023 was BI-RADS Category 0.  She will have left breast diagnostic mammogram on 06/05/2023. - Continue anastrozole daily until end of 2025.  RTC 1 year for follow-up.   2.  Osteoporosis: - She started Fosamax 70 mg weekly last year.  Vitamin D level is 52. - She is tolerating it well.  Will repeat DEXA scan prior to next visit in 1 year.   Breast Cancer therapy associated bone loss: I have recommended calcium, Vitamin D and weight bearing exercises.  Orders Placed This Encounter  Procedures   DG Bone Density    Standing Status:   Future    Standing  Expiration Date:   05/19/2024    Order Specific Question:   Reason for Exam (SYMPTOM  OR DIAGNOSIS REQUIRED)    Answer:   postmenopausal; anti-estrogen therapy    Order Specific Question:   Preferred imaging location?    Answer:   [facility]   CBC with Differential    Standing Status:   Future    Standing Expiration Date:   05/19/2024   Comprehensive metabolic panel    Standing Status:   Future    Standing Expiration Date:   05/19/2024   VITAMIN D 25 Hydroxy (Vit-D Deficiency, Fractures)    Standing Status:   Future    Standing Expiration Date:   05/19/2024      Alben Deeds Teague,acting as a scribe for Beth Massed, MD.,have documented all relevant documentation on the behalf of Beth Massed, MD,as directed by  Beth Massed, MD while in the presence of Beth Massed, MD.   I, Beth Massed MD, have reviewed the above documentation for accuracy and completeness, and I agree with the above.   Helena R Teague   10/29/20249:35 AM  CHIEF COMPLAINT:   Diagnosis: Malignant neoplasm of lower-outer quadrant of left breast of female    Cancer Staging  Breast cancer of lower-outer quadrant of left female breast [facility]) Staging form: Breast, AJCC 7th Edition - Clinical: No stage assigned - Unsigned    Prior Therapy: Radiation Therapy from 06/22/2014 - 08/05/2014: Left breast 50 Gy at 2 Gy per fraction x 25 fractions with left  breast boost 10 Gy at 2 Gy per fraction x 5 fractions by Dr. Michell Heinrich   Current Therapy:  Anastrozole   HISTORY OF PRESENT ILLNESS:   Oncology History  Breast cancer of lower-outer quadrant of left female breast (HCC)  05/24/2014 Initial Diagnosis   Breast cancer of lower-outer quadrant of left female breast   06/22/2014 - 08/05/2014 Radiation Therapy   Left breast 50 Gy at 2 Gy per fraction x 25 fractions with left breast boost 10 Gy at 2 Gy per fraction x 5 fractions by Dr. Michell Heinrich.      INTERVAL HISTORY:    Beth Hamilton is a 67 y.o. female presenting to clinic today for follow up of left breast cancer. She was last seen by me on 05/16/22.  Since her last visit, she underwent a bilateral MM on 05/14/23 that found: in the left breast, possible distortion warrants further evaluation and in the right breast, no findings suspicious for malignancy. She had a colonoscopy on 03/10/23 with Dr Jena Gauss. No dysplasia was found in pathology report.   Today, she states that she is doing well overall. Her appetite level is at 100%. Her energy level is at 100%.   She is tolerating Anastrozole well and denies any side effects including, hot flashes and new aches or pains. She states she does not have any new symptoms since her last visit. She started fosamax 1 year ago and denies any side effects.  PAST MEDICAL HISTORY:   Past Medical History: Past Medical History:  Diagnosis Date   Breast cancer (HCC) 04/20/14   left breast   Complication of anesthesia    not sure what med was given in 96 with D&C, was very sore all over. Dr Jayme Cloud did anesthesia. Dr Kate Sable did surgery   Gout    Hemorrhoids    History of breast cancer 05/19/2015   Hypertension    Hypothyroidism    IBD (inflammatory bowel disease)    UC   Personal history of radiation therapy    2015   Pneumonia    Psoriasis    Thyroid disease    Ulcerative colitis (HCC) 5 1 2007   Dr Jena Gauss    Surgical History: Past Surgical History:  Procedure Laterality Date   BIOPSY  08/02/2019   Procedure: BIOPSY;  Surgeon: Corbin Ade, MD;  [facility];  Service: Endoscopy;;  colon   BIOPSY  03/10/2023   Procedure: BIOPSY;  Surgeon: Corbin Ade, MD;  [facility];  Service: Endoscopy;;   BREAST BIOPSY     BREAST LUMPECTOMY Left 04/20/2014   COLONOSCOPY  10/09/2001   RMR: Internal hemorrhoids and anal papilla; otherwise normal rectum   COLONOSCOPY  12/10/2005   incomplete tcs-  colitis   COLONOSCOPY N/A 04/18/2014   Dr.Rourk-  abnormal rectum & L colon into the mid descending segment. mucosa was erythematious as well as pale w/ some loss of normal vascular apttern. no erosions or ulcers. pt had some smooth peduculated 3-4cm polyps in the sigmoid segment most c/w pseudopolyps. the rest of the colonic mucosa appeared normal. bx= inflammatory polyps   COLONOSCOPY WITH PROPOFOL N/A 08/02/2019   Procedure: COLONOSCOPY WITH PROPOFOL;  Surgeon: Corbin Ade, MD;  [facility];  Service: Endoscopy;  Laterality: N/A;  8:30am   COLONOSCOPY WITH PROPOFOL N/A 03/10/2023   Procedure: COLONOSCOPY WITH PROPOFOL;  Surgeon: Corbin Ade, MD;  [facility];  Service: Endoscopy;  Laterality: N/A;  7:30 am, asa 2  DILATION AND CURETTAGE OF UTERUS  1996   Dr Kate Sable   FLEXIBLE SIGMOIDOSCOPY  02/26/2006   endoscopically normal-appearing rectum, colitis in sigmoid mucosa   PARTIAL MASTECTOMY WITH NEEDLE LOCALIZATION AND AXILLARY SENTINEL LYMPH NODE BX Left 04/20/2014   Procedure: PARTIAL MASTECTOMY AFTER NEEDLE LOCALIZATION AND AXILLARY SENTINEL LYMPH NODE BX;  Surgeon: Dalia Heading, MD;  Location: [facility];  Service: General;  Laterality: Left;   POLYPECTOMY  03/10/2023   Procedure: POLYPECTOMY;  Surgeon: Corbin Ade, MD;  [facility];  Service: Endoscopy;;   TUBAL LIGATION     WISDOM TOOTH EXTRACTION      Social History: Social History   Socioeconomic History   Marital status: Legally Separated    Spouse name: Not on file   Number of children: 1   Years of education: Not on file   Highest education level: Not on file  Occupational History   Not on file  Tobacco Use   Smoking status: Former    Current packs/day: 0.00    Types: Cigarettes    Quit date: 07/26/1990    Years since quitting: 32.8   Smokeless tobacco: Never   Tobacco comments:    quit in 2007  Vaping Use   Vaping status: Never Used  Substance and Sexual Activity   Alcohol use: Yes    Alcohol/week: 14.0 standard drinks of  alcohol    Types: 14 Glasses of wine per week    Comment: wine every day   Drug use: No   Sexual activity: Not Currently    Birth control/protection: Post-menopausal, Abstinence, Surgical    Comment: tubal  Other Topics Concern   Not on file  Social History Narrative   Not on file   Social Determinants of Health   Financial Resource Strain: Low Risk  (05/17/2022)   Overall Financial Resource Strain (CARDIA)    Difficulty of Paying Living Expenses: Not hard at all  Food Insecurity: No Food Insecurity (05/17/2022)   Hunger Vital Sign    Worried About Running Out of Food in the Last Year: Never true    Ran Out of Food in the Last Year: Never true  Transportation Needs: No Transportation Needs (05/17/2022)   PRAPARE - Administrator, Civil Service (Medical): No    Lack of Transportation (Non-Medical): No  Physical Activity: Insufficiently Active (05/17/2022)   Exercise Vital Sign    Days of Exercise per Week: 3 days    Minutes of Exercise per Session: 10 min  Stress: No Stress Concern Present (05/17/2022)   [employer] of Occupational Health - Occupational Stress Questionnaire    Feeling of Stress : Not at all  Social Connections: Moderately Isolated (05/17/2022)   Social Connection and Isolation Panel [NHANES]    Frequency of Communication with Friends and Family: More than three times a week    Frequency of Social Gatherings with Friends and Family: Once a week    Attends Religious Services: More than 4 times per year    Active Member of [employer] or Organizations: No    Attends Banker Meetings: Never    Marital Status: Separated  Intimate Partner Violence: Not At Risk (05/17/2022)   Humiliation, Afraid, Rape, and Kick questionnaire    Fear of Current or Ex-Partner: No    Emotionally Abused: No    Physically Abused: No    Sexually Abused: No    Family History: Family History  Problem Relation Age of Onset   Stroke Maternal Grandfather  Heart attack Maternal Grandfather    Hypertension Mother    Diabetes Paternal Grandfather    Heart disease Paternal Grandfather    Colon cancer Neg Hx     Current Medications:  Current Outpatient Medications:    acyclovir ointment (ZOVIRAX) 5 %, Apply 1 Application topically every 4 (four) hours as needed., Disp: 30 g, Rfl: 3   alendronate (FOSAMAX) 70 MG tablet, PLEASE SEE ATTACHED FOR DETAILED DIRECTIONS, Disp: , Rfl:    allopurinol (ZYLOPRIM) 300 MG tablet, Take 1 tablet (300 mg total) by mouth daily., Disp: 90 tablet, Rfl: 1   amLODipine (NORVASC) 5 MG tablet, Take 1 tablet (5 mg total) by mouth daily., Disp: 90 tablet, Rfl: 0   anastrozole (ARIMIDEX) 1 MG tablet, Take 1 tablet (1 mg total) by mouth daily., Disp: 90 tablet, Rfl: 3   ARMOUR THYROID 60 MG tablet, Take 60 mg by mouth daily. 1/2 pill on Sat and Sun., Disp: , Rfl:    augmented betamethasone dipropionate (DIPROLENE-AF) 0.05 % cream, Apply topically., Disp: , Rfl:    B Complex Vitamins (VITAMIN-B COMPLEX PO), Take 1 mL by mouth daily. Vit b12, Disp: , Rfl:    Calcium Citrate-Vitamin D (CITRACAL + D PO), Take by mouth., Disp: , Rfl:    fluticasone (CUTIVATE) 0.05 % cream, APPLY TO AFFECTED AREAS TWICE DAILY. (Patient taking differently: as needed. APPLY TO AFFECTED AREAS TWICE DAILY.), Disp: 60 g, Rfl: 1   folic acid (FOLVITE) 400 MCG tablet, Take 400 mcg by mouth daily. , Disp: , Rfl:    hyoscyamine (LEVSIN SL) 0.125 MG SL tablet, Place one tablet under tongue up to four times daily as needed for cramping., Disp: 360 tablet, Rfl: 2   lisinopril-hydrochlorothiazide (ZESTORETIC) 20-25 MG tablet, Take 1 tablet by mouth daily., Disp: 90 tablet, Rfl: 1   metFORMIN (GLUCOPHAGE) 500 MG tablet, TAKE 1 TABLET BY MOUTH EVERY DAY, Disp: 90 tablet, Rfl: 1   predniSONE (DELTASONE) 10 MG tablet, Take 40mg  daily for 5 days, then 30mg  daily for 7 days, then 20mg  daily for 7 days, then 10mg  daily for 7 days., Disp: 62 tablet, Rfl: 0    rosuvastatin (CRESTOR) 10 MG tablet, Take 1 tablet (10 mg total) by mouth daily., Disp: 90 tablet, Rfl: 1   STELARA 90 MG/ML SOSY injection, Inject 90 mg into the skin as directed. Every 8 weeks., Disp: , Rfl:    Allergies: Allergies  Allergen Reactions   Flagyl [Metronidazole]     Severe nausea and vomiting   Other Other (See Comments)    Extreme muscle aches several days after surgery. PT SAID SHE WAS ALLERGIC TO SOME MEDICATION USED DURING A PROCEDURE BY DR Jayme Cloud YEARS AGO. SHE DOES NOT KNOW THE NAME OF THE MEDICATION, BUT SAID THAT IT MADE HER WEAK ALL OVER.    Levaquin [Levofloxacin In D5w]     Excessive abd pains and mucousy stool    REVIEW OF SYSTEMS:   Review of Systems  Constitutional:  Negative for chills, fatigue and fever.  HENT:   Negative for lump/mass, mouth sores, nosebleeds, sore throat and trouble swallowing.   Eyes:  Negative for eye problems.  Respiratory:  Negative for cough and shortness of breath.   Cardiovascular:  Negative for chest pain, leg swelling and palpitations.  Gastrointestinal:  Negative for abdominal pain, constipation, diarrhea, nausea and vomiting.  Genitourinary:  Negative for bladder incontinence, difficulty urinating, dysuria, frequency, hematuria and nocturia.   Musculoskeletal:  Negative for arthralgias, back pain, flank pain, myalgias and  neck pain.  Skin:  Negative for itching and rash.  Neurological:  Negative for dizziness, headaches and numbness.  Hematological:  Does not bruise/bleed easily.  Psychiatric/Behavioral:  Negative for depression, sleep disturbance and suicidal ideas. The patient is not nervous/anxious.   All other systems reviewed and are negative.    VITALS:   Blood pressure (!) 152/85, pulse 94, temperature 98 F (36.7 C), temperature source Oral, resp. rate 16, weight 177 lb 3.2 oz (80.4 kg), SpO2 100%.  Wt Readings from Last 3 Encounters:  05/20/23 177 lb 3.2 oz (80.4 kg)  04/07/23 176 lb (79.8 kg)  03/10/23  170 lb (77.1 kg)    Body mass index is 30.42 kg/m.  Performance status (ECOG): 1 - Symptomatic but completely ambulatory  PHYSICAL EXAM:   Physical Exam Vitals and nursing note reviewed. Exam conducted with a chaperone present.  Constitutional:      Appearance: Normal appearance.  Cardiovascular:     Rate and Rhythm: Normal rate and regular rhythm.     Pulses: Normal pulses.     Heart sounds: Normal heart sounds.  Pulmonary:     Effort: Pulmonary effort is normal.     Breath sounds: Normal breath sounds.  Chest:     Comments: +bilaterally on the breasts no palpable masses or adenopathy +left breast lumpectomy scar in left medial quadrant is unchanged Abdominal:     Palpations: Abdomen is soft. There is no hepatomegaly, splenomegaly or mass.     Tenderness: There is no abdominal tenderness.  Musculoskeletal:     Right lower leg: No edema.     Left lower leg: No edema.  Lymphadenopathy:     Cervical: No cervical adenopathy.     Right cervical: No superficial, deep or posterior cervical adenopathy.    Left cervical: No superficial, deep or posterior cervical adenopathy.     Upper Body:     Right upper body: No supraclavicular or axillary adenopathy.     Left upper body: No supraclavicular or axillary adenopathy.  Neurological:     General: No focal deficit present.     Mental Status: She is alert and oriented to person, place, and time.  Psychiatric:        Mood and Affect: Mood normal.        Behavior: Behavior normal.   Breast Exam Chaperone: Chapman Moss, RN   LABS:      Latest Ref Rng & Units 05/14/2023    8:14 AM 03/10/2023    7:42 AM 08/20/2022    9:43 AM  CBC  WBC 4.0 - 10.5 K/uL 9.1   8.6   Hemoglobin 12.0 - 15.0 g/dL 38.7  56.4  33.2   Hematocrit 36.0 - 46.0 % 40.7  42.0  41.4   Platelets 150 - 400 K/uL 257   253       Latest Ref Rng & Units 05/14/2023    8:14 AM 04/04/2023   10:13 AM 03/10/2023    7:42 AM  CMP  Glucose 70 - 99 mg/dL 951   884    BUN 8 - 23 mg/dL 13   7   Creatinine 1.66 - 1.00 mg/dL 0.63   0.16   Sodium 010 - 145 mmol/L 138   140   Potassium 3.5 - 5.1 mmol/L 3.9   3.5   Chloride 98 - 111 mmol/L 99   102   CO2 22 - 32 mmol/L 26     Calcium 8.9 - 10.3 mg/dL 9.1  Total Protein 6.5 - 8.1 g/dL 7.5  7.6    Total Bilirubin 0.3 - 1.2 mg/dL 0.9  0.7    Alkaline Phos 38 - 126 U/L 50  77    AST 15 - 41 U/L 26  19    ALT 0 - 44 U/L 27  21       Lab Results  Component Value Date   CEA 1.1 05/09/2014   /  CEA  Date Value Ref Range Status  05/09/2014 1.1 0.0 - 5.0 ng/mL Final    Comment:    Performed at [facility]   No results found for: "PSA1" No results found for: "CAN199" No results found for: "CAN125"  No results found for: "TOTALPROTELP", "ALBUMINELP", "A1GS", "A2GS", "BETS", "BETA2SER", "GAMS", "MSPIKE", "SPEI" No results found for: "TIBC", "FERRITIN", "IRONPCTSAT" No results found for: "LDH"   STUDIES:   MM 3D SCREEN BREAST BILATERAL  Result Date: 05/15/2023 CLINICAL DATA:  Screening. EXAM: DIGITAL SCREENING BILATERAL MAMMOGRAM WITH TOMOSYNTHESIS AND CAD TECHNIQUE: Bilateral screening digital craniocaudal and mediolateral oblique mammograms were obtained. Bilateral screening digital breast tomosynthesis was performed. The images were evaluated with computer-aided detection. COMPARISON:  Previous exam(s). ACR Breast Density Category c: The breasts are heterogeneously dense, which may obscure small masses. FINDINGS: In the left breast, possible distortion warrants further evaluation. In the right breast, no findings suspicious for malignancy. IMPRESSION: Further evaluation is suggested for possible distortion in the left breast. RECOMMENDATION: Diagnostic mammogram and possibly ultrasound of the left breast. (Code:[id number]-[id number]) The patient will be contacted regarding the findings, and additional imaging will be scheduled. BI-RADS CATEGORY  0: Incomplete: Need additional imaging evaluation.  Electronically Signed   By: Frederico Hamman M.D.   On: 05/15/2023 16:59

## 2023-05-20 ENCOUNTER — Inpatient Hospital Stay: Payer: BC Managed Care – PPO | Admitting: Hematology

## 2023-05-20 VITALS — BP 152/85 | HR 94 | Temp 98.0°F | Resp 16 | Wt 177.2 lb

## 2023-05-20 DIAGNOSIS — Z780 Asymptomatic menopausal state: Secondary | ICD-10-CM | POA: Diagnosis not present

## 2023-05-20 DIAGNOSIS — Z79899 Other long term (current) drug therapy: Secondary | ICD-10-CM | POA: Diagnosis not present

## 2023-05-20 DIAGNOSIS — Z170 Estrogen receptor positive status [ER+]: Secondary | ICD-10-CM

## 2023-05-20 DIAGNOSIS — C50512 Malignant neoplasm of lower-outer quadrant of left female breast: Secondary | ICD-10-CM

## 2023-05-20 NOTE — Patient Instructions (Signed)
[facility] at [facility] Discharge Instructions   You were seen and examined today by Dr. Ellin Saba.  He reviewed the results of your lab work which are normal/stable.   Continue anastrozole as prescribed.   Continue Fosamax as prescribed.  We will see you back in one year. We will repeat lab work prior to your next visit.   Return as scheduled.    Thank you for choosing [facility] at [facility] to provide your oncology and hematology care.  To afford each patient quality time with our provider, please arrive at least 15 minutes before your scheduled appointment time.   You need to re-schedule your appointment should you arrive 10 or more minutes late.  We strive to give you quality time with our providers, and arriving late affects you and other patients whose appointments are after yours.  Also, if you no show three or more times for appointments you may be dismissed from the clinic at the providers discretion.     Again, thank you for choosing [facility].  Our hope is that these requests will decrease the amount of time that you wait before being seen by our physicians.       _____________________________________________________________  Should you have questions after your visit to [facility], please contact our office at [phone] and follow the prompts.  Our office hours are 8:00 a.m. and 4:30 p.m. Monday - Friday.  Please note that voicemails left after 4:00 p.m. may not be returned until the following business day.  We are closed weekends and major holidays.  You do have access to a nurse 24-7, just call the main number to the clinic [phone] and do not press any options, hold on the line and a nurse will answer the phone.    For prescription refill requests, have your pharmacy contact our office and allow 72 hours.    Due to Covid, you will need to wear a mask upon entering the hospital. If you  do not have a mask, a mask will be given to you at the Main Entrance upon arrival. For doctor visits, patients may have 1 support person age 35 or older with them. For treatment visits, patients can not have anyone with them due to social distancing guidelines and our immunocompromised population.

## 2023-05-21 ENCOUNTER — Other Ambulatory Visit (HOSPITAL_COMMUNITY)
Admission: RE | Admit: 2023-05-21 | Discharge: 2023-05-21 | Disposition: A | Discharge status: Home / Self Care | Payer: BC Managed Care – PPO | Source: Ambulatory Visit | Attending: Gastroenterology | Admitting: Gastroenterology

## 2023-05-21 ENCOUNTER — Encounter: Payer: Self-pay | Admitting: Adult Health

## 2023-05-21 ENCOUNTER — Ambulatory Visit: Payer: BC Managed Care – PPO | Admitting: Adult Health

## 2023-05-21 VITALS — BP 137/85 | HR 93 | Ht 64.0 in | Wt 177.0 lb

## 2023-05-21 DIAGNOSIS — Z780 Asymptomatic menopausal state: Secondary | ICD-10-CM

## 2023-05-21 DIAGNOSIS — Z853 Personal history of malignant neoplasm of breast: Secondary | ICD-10-CM | POA: Diagnosis not present

## 2023-05-21 DIAGNOSIS — K5190 Ulcerative colitis, unspecified, without complications: Secondary | ICD-10-CM | POA: Diagnosis present

## 2023-05-21 DIAGNOSIS — Z1339 Encounter for screening examination for other mental health and behavioral disorders: Secondary | ICD-10-CM

## 2023-05-21 DIAGNOSIS — Z01419 Encounter for gynecological examination (general) (routine) without abnormal findings: Secondary | ICD-10-CM

## 2023-05-21 LAB — C-REACTIVE PROTEIN: CRP: 0.6 mg/dL (ref <1.0)

## 2023-05-21 LAB — SEDIMENTATION RATE: Sed Rate: 22 mm/h (ref 0–22)

## 2023-05-21 NOTE — Progress Notes (Signed)
Patient ID: Beth Hamilton, female   DOB: 1956-05-23, 67 y.o.   MRN: [id number] History of Present Illness: Beth Hamilton is a 67 year old white female, separated, PM in for a well woman gyn exam. She saw Dr Kirtland Bouchard yesterday, history of breat cancer on arimidex.     Component Value Date/Time   DIAGPAP  05/11/2021 0840    - Negative for intraepithelial lesion or malignancy (NILM)   DIAGPAP  05/29/2018 0000    NEGATIVE FOR INTRAEPITHELIAL LESIONS OR MALIGNANCY.   HPVHIGH Negative 05/11/2021 0840   ADEQPAP  05/11/2021 0840    Satisfactory for evaluation. The presence or absence of an   ADEQPAP  05/11/2021 0840    endocervical/transformation zone component cannot be determined because   ADEQPAP of atrophy. 05/11/2021 0840   PCP is Dr Lilyan Punt.   Current Medications, Allergies, Past Medical History, Past Surgical History, Family History and Social History were reviewed in [employer] record.     Review of Systems: Patient denies any headaches, hearing loss, fatigue, blurred vision, shortness of breath, chest pain, abdominal pain, problems with bowel movements,(has UC), urination, or intercourse(not active). No joint pain or mood swings.  Denies any vaginal bleeding    Physical Exam:BP 137/85 (BP Location: Right Arm, Patient Position: Sitting, Cuff Size: Normal)   Pulse 93   Ht 5\' 4"  (1.626 m)   Wt 177 lb (80.3 kg)   BMI 30.38 kg/m   General:  Well developed, well nourished, no acute distress Skin:  Warm and dry Neck:  Midline trachea, normal thyroid, good ROM, no lymphadenopathy, no carotid bruits heard Lungs; Clear to auscultation bilaterally Breast:  No dominant palpable mass, retraction, or nipple discharge, has scar and indentation left breast UIQ, with firm tissue around scar edge, had mammogram 05/14/23 showing possible distortion Cardiovascular: Regular rate and rhythm Abdomen:  Soft, non tender, no hepatosplenomegaly Pelvic:  External genitalia is normal in  appearance, no lesions.  The vagina is pale and atrophic. Urethra has no lesions or masses. The cervix is smooth.  Uterus is felt to be normal size, shape, and contour.  No adnexal masses or tenderness noted.Bladder is non tender, no masses felt. Rectal:Deferred Extremities/musculoskeletal:  No swelling or varicosities noted, no clubbing or cyanosis Psych:  No mood changes, alert and cooperative,seems happy AA is 4 Fall risk is low    05/21/2023    8:43 AM 09/10/2022    8:08 AM 05/17/2022    8:39 AM  Depression screen PHQ 2/9  Decreased Interest 0 0 0  Down, Depressed, Hopeless 0 0 0  PHQ - 2 Score 0 0 0  Altered sleeping 0  0  Tired, decreased energy 0  0  Change in appetite 0  0  Feeling bad or failure about yourself  0  0  Trouble concentrating 0  0  Moving slowly or fidgety/restless 0  0  Suicidal thoughts 0  0  PHQ-9 Score 0  0       05/21/2023    8:43 AM 09/10/2022    8:08 AM 05/17/2022    8:39 AM 05/11/2021    8:44 AM  GAD 7 : Generalized Anxiety Score  Nervous, Anxious, on Edge 0 0 0 0  Control/stop worrying 0 0 0 0  Worry too much - different things 0 0 0 0  Trouble relaxing 0 0 0 0  Restless 0 0 0 0  Easily annoyed or irritable 0 0 0 0  Afraid - awful might happen 0  0 0 0  Total GAD 7 Score 0 0 0 0      Upstream - 05/21/23 0839       Pregnancy Intention Screening   Does the patient want to become pregnant in the next year? No    Does the patient's partner want to become pregnant in the next year? No    Would the patient like to discuss contraceptive options today? No      Contraception Wrap Up   Current Method Female Sterilization;Abstinence   PM   End Method Female Sterilization;Abstinence   PM   Contraception Counseling Provided No            Examination chaperoned by Malachy Mood LPN   Impression and Plan:  1. Encounter for well woman exam with routine gynecological exam Pap and physical in 1 year Labs with Dr Kirtland Bouchard Mammogram was 05/14/23 with  possible distortion left breast for follow up 06/05/23 a [facility] Had colonoscopy in August  2. Postmenopause Denies any vaginal bleeding  3. History of breast cancer Had left breast cancer 9 years ago, had lumpectomy and radiation, and on arimidex

## 2023-05-26 ENCOUNTER — Ambulatory Visit: Payer: BC Managed Care – PPO | Admitting: Internal Medicine

## 2023-05-26 ENCOUNTER — Encounter: Payer: Self-pay | Admitting: Internal Medicine

## 2023-05-26 VITALS — BP 132/85 | HR 92 | Temp 98.7°F | Ht 64.0 in | Wt 176.8 lb

## 2023-05-26 DIAGNOSIS — K5190 Ulcerative colitis, unspecified, without complications: Secondary | ICD-10-CM

## 2023-05-26 DIAGNOSIS — K5150 Left sided colitis without complications: Secondary | ICD-10-CM | POA: Diagnosis not present

## 2023-05-26 DIAGNOSIS — Z20820 Contact with and (suspected) exposure to varicella: Secondary | ICD-10-CM

## 2023-05-26 MED ORDER — RINVOQ 45 MG PO TB24: 45.0000 mg | ORAL_TABLET | Freq: Every day | ORAL | 0 refills | Status: DC

## 2023-05-26 NOTE — Progress Notes (Signed)
Primary Care Physician:  Babs Sciara, MD Primary Gastroenterologist:  Dr. Jena Gauss  Pre-Procedure History & Physical: HPI:  Beth Hamilton is a 67 y.o. female here for for follow-up left-sided proctocolitis.  Colonoscopy 2 months ago demonstrated severe sigmoid colitis less inflammation in the rectum more proximal colon appeared normal endoscopically histologically.  At this lady has been on Humira/azathioprine previously-ultimately failed.  When on Stelara she has been on it about 6 months has not had a good response continues to have require intermittent prednisone on her good days she does not feel that good.  She is afraid to go out because of tenesmus and diarrhea.  Calprotectin 1,000.  Sed rate and CRP normal (on prednisone recently).  Not much in way of abdominal pain when she takes prednisone it settles everything down.  She is wary of needles and would like to avoid any infusions and self administration of any medication or current requiring an injection  She has been fully vaccinated including for zoster.  No history of DVT.  History of stage I breast cancer.  Past Medical History:  Diagnosis Date   Breast cancer (HCC) 04/20/14   left breast   Complication of anesthesia    not sure what med was given in 96 with D&C, was very sore all over. Dr Jayme Cloud did anesthesia. Dr Kate Sable did surgery   Gout    Hemorrhoids    History of breast cancer 05/19/2015   Hypertension    Hypothyroidism    IBD (inflammatory bowel disease)    UC   Personal history of radiation therapy    2015   Pneumonia    Psoriasis    Thyroid disease    Ulcerative colitis (HCC) 5 1 2007   Dr Jena Gauss    Past Surgical History:  Procedure Laterality Date   BIOPSY  08/02/2019   Procedure: BIOPSY;  Surgeon: Corbin Ade, MD;  [facility]: [facility];  Service: Endoscopy;;  colon   BIOPSY  03/10/2023   Procedure: BIOPSY;  Surgeon: Corbin Ade, MD;  [facility]: [facility];  Service: Endoscopy;;   BREAST  BIOPSY     BREAST LUMPECTOMY Left 04/20/2014   COLONOSCOPY  10/09/2001   RMR: Internal hemorrhoids and anal papilla; otherwise normal rectum   COLONOSCOPY  12/10/2005   incomplete tcs-  colitis   COLONOSCOPY N/A 04/18/2014   Dr.Jerrel Tiberio- abnormal rectum & L colon into the mid descending segment. mucosa was erythematious as well as pale w/ some loss of normal vascular apttern. no erosions or ulcers. pt had some smooth peduculated 3-4cm polyps in the sigmoid segment most c/w pseudopolyps. the rest of the colonic mucosa appeared normal. bx= inflammatory polyps   COLONOSCOPY WITH PROPOFOL N/A 08/02/2019   Procedure: COLONOSCOPY WITH PROPOFOL;  Surgeon: Corbin Ade, MD;  [facility];  Service: Endoscopy;  Laterality: N/A;  8:30am   COLONOSCOPY WITH PROPOFOL N/A 03/10/2023   Procedure: COLONOSCOPY WITH PROPOFOL;  Surgeon: Corbin Ade, MD;  [facility]: [facility];  Service: Endoscopy;  Laterality: N/A;  7:30 am, asa 2   DILATION AND CURETTAGE OF UTERUS  1996   Dr Kate Sable   FLEXIBLE SIGMOIDOSCOPY  02/26/2006   endoscopically normal-appearing rectum, colitis in sigmoid mucosa   PARTIAL MASTECTOMY WITH NEEDLE LOCALIZATION AND AXILLARY SENTINEL LYMPH NODE BX Left 04/20/2014   Procedure: PARTIAL MASTECTOMY AFTER NEEDLE LOCALIZATION AND AXILLARY SENTINEL LYMPH NODE BX;  Surgeon: Dalia Heading, MD;  Location: [facility];  Service: General;  Laterality: Left;   POLYPECTOMY  03/10/2023   Procedure: POLYPECTOMY;  Surgeon: Corbin Ade, MD;  [facility];  Service: Endoscopy;;   TUBAL LIGATION     WISDOM TOOTH EXTRACTION      Prior to Admission medications   Medication Sig Start Date End Date Taking? Authorizing Provider  acyclovir ointment (ZOVIRAX) 5 % Apply 1 Application topically every 4 (four) hours as needed. 09/10/22  Yes Babs Sciara, MD  alendronate (FOSAMAX) 70 MG tablet PLEASE SEE ATTACHED FOR DETAILED DIRECTIONS 03/29/23  Yes [provider]  allopurinol (ZYLOPRIM)  300 MG tablet Take 1 tablet (300 mg total) by mouth daily. 03/11/23  Yes Babs Sciara, MD  amLODipine (NORVASC) 5 MG tablet Take 1 tablet (5 mg total) by mouth daily. 03/11/23  Yes Babs Sciara, MD  anastrozole (ARIMIDEX) 1 MG tablet Take 1 tablet (1 mg total) by mouth daily. 08/20/22  Yes Doreatha Massed, MD  ARMOUR THYROID 60 MG tablet Take 60 mg by mouth daily. 1/2 pill on Sat and Sun. 11/16/18  Yes [provider]  augmented betamethasone dipropionate (DIPROLENE-AF) 0.05 % cream Apply topically. 02/04/23  Yes [provider]  B Complex Vitamins (VITAMIN-B COMPLEX PO) Take 1 mL by mouth daily. Vit b12   Yes [provider]  Calcium Citrate-Vitamin D (CITRACAL + D PO) Take by mouth.   Yes [provider]  fluticasone (CUTIVATE) 0.05 % cream APPLY TO AFFECTED AREAS TWICE DAILY. 12/18/18  Yes Babs Sciara, MD  folic acid (FOLVITE) 400 MCG tablet Take 400 mcg by mouth daily.    Yes [provider]  hyoscyamine (LEVSIN SL) 0.125 MG SL tablet Place one tablet under tongue up to four times daily as needed for cramping. 05/12/23  Yes Tiffany Kocher, PA-C  lisinopril-hydrochlorothiazide (ZESTORETIC) 20-25 MG tablet Take 1 tablet by mouth daily. 03/11/23  Yes Babs Sciara, MD  metFORMIN (GLUCOPHAGE) 500 MG tablet TAKE 1 TABLET BY MOUTH EVERY DAY 03/03/23  Yes Luking, Scott A, MD  rosuvastatin (CRESTOR) 10 MG tablet Take 1 tablet (10 mg total) by mouth daily. 04/08/23  Yes Luking, Jonna Coup, MD  STELARA 90 MG/ML SOSY injection Inject 90 mg into the skin as directed. Every 8 weeks. 11/29/22  Yes [provider]    Allergies as of 05/26/2023 - Review Complete 05/26/2023  Allergen Reaction Noted   Flagyl [metronidazole]  10/29/2013   Other Other (See Comments) 03/22/2014   Levaquin [levofloxacin in d5w]  07/23/2013    Family History  Problem Relation Age of Onset   Stroke Maternal Grandfather    Heart attack Maternal Grandfather     Hypertension Mother    Diabetes Paternal Grandfather    Heart disease Paternal Grandfather    Colon cancer Neg Hx     Social History   Socioeconomic History   Marital status: Legally Separated    Spouse name: Not on file   Number of children: 1   Years of education: Not on file   Highest education level: Not on file  Occupational History   Not on file  Tobacco Use   Smoking status: Former    Current packs/day: 0.00    Types: Cigarettes    Quit date: 07/26/1990    Years since quitting: 32.8   Smokeless tobacco: Never   Tobacco comments:    quit in 2007  Vaping Use   Vaping status: Never Used  Substance and Sexual Activity   Alcohol use: Yes  Alcohol/week: 14.0 standard drinks of alcohol    Types: 14 Glasses of wine per week    Comment: wine every day   Drug use: No   Sexual activity: Not Currently    Birth control/protection: Post-menopausal, Abstinence, Surgical    Comment: tubal  Other Topics Concern   Not on file  Social History Narrative   Not on file   Social Determinants of Health   Financial Resource Strain: Low Risk  (05/21/2023)   Overall Financial Resource Strain (CARDIA)    Difficulty of Paying Living Expenses: Not hard at all  Food Insecurity: No Food Insecurity (05/21/2023)   Hunger Vital Sign    Worried About Running Out of Food in the Last Year: Never true    Ran Out of Food in the Last Year: Never true  Transportation Needs: No Transportation Needs (05/21/2023)   PRAPARE - Administrator, Civil Service (Medical): No    Lack of Transportation (Non-Medical): No  Physical Activity: Insufficiently Active (05/21/2023)   Exercise Vital Sign    Days of Exercise per Week: 3 days    Minutes of Exercise per Session: 20 min  Stress: No Stress Concern Present (05/21/2023)   [employer] of Occupational Health - Occupational Stress Questionnaire    Feeling of Stress : Not at all  Social Connections: Moderately Isolated (05/21/2023)    Social Connection and Isolation Panel [NHANES]    Frequency of Communication with Friends and Family: Three times a week    Frequency of Social Gatherings with Friends and Family: More than three times a week    Attends Religious Services: More than 4 times per year    Active Member of [employer] or Organizations: No    Attends Banker Meetings: Never    Marital Status: Separated  Intimate Partner Violence: Not At Risk (05/21/2023)   Humiliation, Afraid, Rape, and Kick questionnaire    Fear of Current or Ex-Partner: No    Emotionally Abused: No    Physically Abused: No    Sexually Abused: No    Review of Systems: See HPI, otherwise negative ROS  Physical Exam: BP 132/85 (BP Location: Right Arm, Patient Position: Sitting, Cuff Size: Normal)   Pulse 92   Temp 98.7 F (37.1 C) (Oral)   Ht 5\' 4"  (1.626 m)   Wt 176 lb 12.8 oz (80.2 kg)   SpO2 96%   BMI 30.35 kg/m  General:   Alert,  Well-developed, well-nourished, pleasant and cooperative in NAD Abdomen: Moderately obese.  Positive bowel sounds soft and nontender.  No mass. Impression/Plan: 67 year old lady with a greater than decade history of what initially began as indeterminate colitis and has evolved into the phenotype of ulcerative colitis.  Left-sided.  Severe histologically and endoscopically recent assessment. Calprotectin markedly elevated.  She has required multiple rounds of prednisone while on biologic therapy.  This lady has recalcitrant left-sided proctocolitis.  Ultimately, she has failed Humira and Stelara.  She is quite emotional when talking about further therapy requiring injections.  She has a history of psoriasis.  Looking at her insurance benefits, written Rinvoq or Skyrizi appear to be reasonably good choices to which to pivot.  She would like to stay away from injection medications, we will pursue oral, small molecule treatment.  Will go forward with Rinvoq.  Discussed the risk and benefits  again. Literature to be provided. Risk of infection just like the myriad of other IV treatments on the market exist with Rinvoq as well.  In  particular, Rinvoq has been associated with shingles.  In addition, this type of medication may adversely affect lipids and as a relatively low risk of blood clot.  She has completed shingles vaccine.  Recommendations:  Herpes zoster IgG assay  Begin Rinvoq 45 mg daily for 1 month; then drop back to 15 mg daily thereafter  Just before her 50-month office visit, c-Met, CBC, CRP and fecal calprotectin.  Will also obtain a lipid profile at that time.  Further recommendations to follow.      Notice: This dictation was prepared with Dragon dictation along with smaller phrase technology. Any transcriptional errors that result from this process are unintentional and may not be corrected upon review.

## 2023-05-26 NOTE — Patient Instructions (Addendum)
It was good to see you again today!  As discussed based on your continued symptoms, and findings at colonoscopy, recently inflammatory markers, pathology report, you still have rather active severe ulcerative colitis.  You have failed to gain a good remission with either Humira azathioprine and Stelara  To get away from injections, after discussion, we pivot away from injection agents for now  Will start written Rinvoq 45 mg daily for 1 month then drop back to 15 mg for maintenance  Potential risk of infection as with the other medications again reviewed.  Need a blood test to make sure that you have antibodies against shingles  Will recheck blood work just before your follow-up appointment in 3 months.  Hopefully, this medication will provide an excellent remission.  If it does not, we may need to get the help of one of the inflammatory bowel disease specialist over at [facility] to guide future therapy.

## 2023-05-28 LAB — VARICELLA ZOSTER ANTIBODY, IGG: Varicella zoster IgG: REACTIVE

## 2023-05-30 ENCOUNTER — Telehealth: Payer: Self-pay

## 2023-05-30 NOTE — Telephone Encounter (Signed)
PA for Rinvoq was approved. Pt called wanting to know if she is ok to start taking the Rinvoq once she receives it in the mail. Pt states that she took her last Stelara injection yesterday. Please advise.

## 2023-05-30 NOTE — Telephone Encounter (Signed)
Pt is aware.  

## 2023-06-05 ENCOUNTER — Ambulatory Visit (HOSPITAL_COMMUNITY)
Admission: RE | Admit: 2023-06-05 | Discharge: 2023-06-05 | Disposition: A | Discharge status: Home / Self Care | Payer: BC Managed Care – PPO | Source: Ambulatory Visit | Attending: Hematology | Admitting: Hematology

## 2023-06-05 ENCOUNTER — Encounter (HOSPITAL_COMMUNITY): Payer: Self-pay

## 2023-06-05 DIAGNOSIS — R928 Other abnormal and inconclusive findings on diagnostic imaging of breast: Secondary | ICD-10-CM | POA: Insufficient documentation

## 2023-06-06 ENCOUNTER — Other Ambulatory Visit (HOSPITAL_COMMUNITY): Payer: Self-pay | Admitting: Hematology

## 2023-06-06 ENCOUNTER — Other Ambulatory Visit: Payer: Self-pay | Admitting: Internal Medicine

## 2023-06-06 DIAGNOSIS — R928 Other abnormal and inconclusive findings on diagnostic imaging of breast: Secondary | ICD-10-CM

## 2023-06-13 ENCOUNTER — Ambulatory Visit
Admission: RE | Admit: 2023-06-13 | Discharge: 2023-06-13 | Disposition: A | Discharge status: Home / Self Care | Payer: BC Managed Care – PPO | Source: Ambulatory Visit | Attending: Hematology | Admitting: Hematology

## 2023-06-13 ENCOUNTER — Encounter (HOSPITAL_COMMUNITY): Payer: Self-pay | Admitting: Hematology

## 2023-06-13 DIAGNOSIS — R928 Other abnormal and inconclusive findings on diagnostic imaging of breast: Secondary | ICD-10-CM

## 2023-06-13 HISTORY — PX: BREAST BIOPSY: SHX20

## 2023-06-16 LAB — SURGICAL PATHOLOGY

## 2023-06-18 ENCOUNTER — Other Ambulatory Visit: Payer: Self-pay | Admitting: Hematology

## 2023-06-23 ENCOUNTER — Other Ambulatory Visit: Payer: Self-pay

## 2023-06-23 MED ORDER — RINVOQ 15 MG PO TB24: 15.0000 mg | ORAL_TABLET | Freq: Every day | ORAL | 11 refills | Status: DC

## 2023-07-09 ENCOUNTER — Telehealth: Payer: Self-pay

## 2023-07-09 NOTE — Telephone Encounter (Signed)
[facility] pharmacy called wanting to know why the pt was only prescribed 45 mg of Rinvoq for one month instead of two. Pharmacy states that it is typically given for two months then dropped down to 15 mg thereafter. Pharmacy is wanting to ensure that the pt gets the correct loading dose. Please advise.

## 2023-07-09 NOTE — Telephone Encounter (Signed)
Spoke to [facility] and 45mg  will be sent to the pt.

## 2023-07-19 ENCOUNTER — Other Ambulatory Visit: Payer: Self-pay | Admitting: Family Medicine

## 2023-07-19 DIAGNOSIS — I10 Essential (primary) hypertension: Secondary | ICD-10-CM

## 2023-07-27 ENCOUNTER — Other Ambulatory Visit: Payer: Self-pay | Admitting: Hematology

## 2023-07-27 ENCOUNTER — Other Ambulatory Visit: Payer: Self-pay | Admitting: Family Medicine

## 2023-07-27 DIAGNOSIS — C50512 Malignant neoplasm of lower-outer quadrant of left female breast: Secondary | ICD-10-CM

## 2023-07-28 ENCOUNTER — Encounter (HOSPITAL_COMMUNITY): Payer: Self-pay | Admitting: Hematology

## 2023-08-01 ENCOUNTER — Encounter: Payer: Self-pay | Admitting: Internal Medicine

## 2023-08-29 ENCOUNTER — Ambulatory Visit: Payer: BC Managed Care – PPO | Admitting: Internal Medicine

## 2023-08-29 ENCOUNTER — Encounter: Payer: Self-pay | Admitting: Internal Medicine

## 2023-08-29 VITALS — BP 121/78 | HR 87 | Temp 98.4°F | Ht 64.0 in | Wt 182.0 lb

## 2023-08-29 DIAGNOSIS — K5150 Left sided colitis without complications: Secondary | ICD-10-CM

## 2023-08-29 DIAGNOSIS — K5190 Ulcerative colitis, unspecified, without complications: Secondary | ICD-10-CM

## 2023-08-29 NOTE — Progress Notes (Signed)
 Primary Care Physician:  Alphonsa Glendia LABOR, MD Primary Gastroenterologist:  Dr. Shaaron  Pre-Procedure History & Physical: HPI:  Beth Hamilton is a 68 y.o. female here for longstanding left-sided proctocolitis. Humira  failure.  Now on  Rinvoq  -steady-state dose 15 mg daily.  Doing great about 2 bowel movements daily.  No bleeding no tenesmus no abdominal pain  - sense of wellbeing is very good.  Feels like her old self.  LDL elevated last year-Crestor  added to her regimen per Dr. Alphonsa.  Past Medical History:  Diagnosis Date   Breast cancer (HCC) 04/20/14   left breast   Complication of anesthesia    not sure what med was given in 96 with D&C, was very sore all over. Dr Tamea did anesthesia. Dr Harriette did surgery   Gout    Hemorrhoids    History of breast cancer 05/19/2015   Hypertension    Hypothyroidism    IBD (inflammatory bowel disease)    [facility]   Personal history of radiation therapy    2015   Pneumonia    Psoriasis    Thyroid  disease    Ulcerative colitis (HCC) 5 1 2007   Dr Shaaron    Past Surgical History:  Procedure Laterality Date   BIOPSY  08/02/2019   Procedure: BIOPSY;  Surgeon: Shaaron Lamar HERO, MD;  [facility];  Service: Endoscopy;;  colon   BIOPSY  03/10/2023   Procedure: BIOPSY;  Surgeon: Shaaron Lamar HERO, MD;  [facility];  Service: Endoscopy;;   BREAST BIOPSY     BREAST BIOPSY Left 06/13/2023   MM LT BREAST BX W LOC DEV 1ST LESION IMAGE BX SPEC STEREO GUIDE 06/13/2023 [facility]   BREAST LUMPECTOMY Left 04/20/2014   COLONOSCOPY  10/09/2001   RMR: Internal hemorrhoids and anal papilla; otherwise normal rectum   COLONOSCOPY  12/10/2005   incomplete tcs-  colitis   COLONOSCOPY N/A 04/18/2014   Dr.Sarea Fyfe- abnormal rectum & L colon into the mid descending segment. mucosa was erythematious as well as pale w/ some loss of normal vascular apttern. no erosions or ulcers. pt had some smooth peduculated 3-4cm polyps in the sigmoid  segment most c/w pseudopolyps. the rest of the colonic mucosa appeared normal. bx= inflammatory polyps   COLONOSCOPY WITH PROPOFOL  N/A 08/02/2019   Procedure: COLONOSCOPY WITH PROPOFOL ;  Surgeon: Shaaron Lamar HERO, MD;  [facility];  Service: Endoscopy;  Laterality: N/A;  8:30am   COLONOSCOPY WITH PROPOFOL  N/A 03/10/2023   Procedure: COLONOSCOPY WITH PROPOFOL ;  Surgeon: Shaaron Lamar HERO, MD;  [facility];  Service: Endoscopy;  Laterality: N/A;  7:30 am, asa 2   DILATION AND CURETTAGE OF UTERUS  1996   Dr Harriette   FLEXIBLE SIGMOIDOSCOPY  02/26/2006   endoscopically normal-appearing rectum, colitis in sigmoid mucosa   PARTIAL MASTECTOMY WITH NEEDLE LOCALIZATION AND AXILLARY SENTINEL LYMPH NODE BX Left 04/20/2014   Procedure: PARTIAL MASTECTOMY AFTER NEEDLE LOCALIZATION AND AXILLARY SENTINEL LYMPH NODE BX;  Surgeon: Oneil LABOR Budge, MD;  Location: [facility];  Service: General;  Laterality: Left;   POLYPECTOMY  03/10/2023   Procedure: POLYPECTOMY;  Surgeon: Shaaron Lamar HERO, MD;  [facility];  Service: Endoscopy;;   TUBAL LIGATION     WISDOM TOOTH EXTRACTION      Prior to Admission medications   Medication Sig Start Date End Date Taking? Authorizing Provider  acyclovir  ointment (ZOVIRAX ) 5 % Apply 1 Application topically every 4 (four) hours as needed.  09/10/22  Yes Luking, Glendia LABOR, MD  alendronate  (FOSAMAX ) 70 MG tablet TAKE 1 TABLET (70 MG TOTAL) BY MOUTH ONCE A WEEK. TAKE WITH A FULL GLASS OF WATER  ON AN EMPTY STOMACH. 06/18/23  Yes Rogers Hai, MD  allopurinol  (ZYLOPRIM ) 300 MG tablet Take 1 tablet (300 mg total) by mouth daily. 03/11/23  Yes Luking, Glendia LABOR, MD  amLODipine  (NORVASC ) 5 MG tablet TAKE 1 TABLET (5 MG TOTAL) BY MOUTH DAILY. 07/21/23  Yes Luking, Glendia LABOR, MD  anastrozole  (ARIMIDEX ) 1 MG tablet TAKE 1 TABLET BY MOUTH EVERY DAY 07/28/23  Yes Rogers Hai, MD  ARMOUR THYROID  60 MG tablet Take 60 mg by mouth daily. 1/2 pill on Sat and Sun. 11/16/18   Yes [provider]  augmented betamethasone dipropionate (DIPROLENE-AF) 0.05 % cream Apply topically. 02/04/23  Yes [provider]  B Complex Vitamins (VITAMIN-B COMPLEX PO) Take 1 mL by mouth daily. Vit b12   Yes [provider]  Calcium  Citrate-Vitamin D  (CITRACAL + D PO) Take by mouth.   Yes [provider]  fluticasone  (CUTIVATE ) 0.05 % cream APPLY TO AFFECTED AREAS TWICE DAILY. 12/18/18  Yes Luking, Glendia LABOR, MD  folic acid  (FOLVITE ) 400 MCG tablet Take 400 mcg by mouth daily.    Yes [provider]  hyoscyamine  (LEVSIN  SL) 0.125 MG SL tablet Place one tablet under tongue up to four times daily as needed for cramping. 05/12/23  Yes Ezzard Sonny RAMAN, PA-C  lisinopril -hydrochlorothiazide  (ZESTORETIC ) 20-25 MG tablet Take 1 tablet by mouth daily. 03/11/23  Yes Luking, Glendia LABOR, MD  metFORMIN  (GLUCOPHAGE ) 500 MG tablet TAKE 1 TABLET BY MOUTH EVERY DAY 07/28/23  Yes Luking, Scott A, MD  rosuvastatin  (CRESTOR ) 10 MG tablet TAKE 1 TABLET BY MOUTH EVERY DAY 07/28/23  Yes Alphonsa Glendia LABOR, MD  Upadacitinib  ER (RINVOQ ) 15 MG TB24 Take 1 tablet (15 mg total) by mouth daily. 06/23/23  Yes RourkLamar HERO, MD    Allergies as of 08/29/2023 - Review Complete 08/29/2023  Allergen Reaction Noted   Flagyl [metronidazole]  10/29/2013   Other Other (See Comments) 03/22/2014   Levaquin [levofloxacin in d5w]  07/23/2013    Family History  Problem Relation Age of Onset   Stroke Maternal Grandfather    Heart attack Maternal Grandfather    Hypertension Mother    Diabetes Paternal Grandfather    Heart disease Paternal Grandfather    Colon cancer Neg Hx     Social History   Socioeconomic History   Marital status: Legally Separated    Spouse name: Not on file   Number of children: 1   Years of education: Not on file   Highest education level: Not on file  Occupational History   Not on file  Tobacco Use   Smoking status: Former    Current packs/day: 0.00     Types: Cigarettes    Quit date: 07/26/1990    Years since quitting: 33.1   Smokeless tobacco: Never   Tobacco comments:    quit in 2007  Vaping Use   Vaping status: Never Used  Substance and Sexual Activity   Alcohol use: Yes    Alcohol/week: 14.0 standard drinks of alcohol    Types: 14 Glasses of wine per week    Comment: wine every day   Drug use: No   Sexual activity: Not Currently    Birth control/protection: Post-menopausal, Abstinence, Surgical    Comment: tubal  Other Topics Concern   Not on file  Social History  Narrative   Not on file   Social Drivers of Health   Financial Resource Strain: Low Risk  (05/21/2023)   Overall Financial Resource Strain (CARDIA)    Difficulty of Paying Living Expenses: Not hard at all  Food Insecurity: No Food Insecurity (05/21/2023)   Hunger Vital Sign    Worried About Running Out of Food in the Last Year: Never true    Ran Out of Food in the Last Year: Never true  Transportation Needs: No Transportation Needs (05/21/2023)   PRAPARE - Administrator, Civil Service (Medical): No    Lack of Transportation (Non-Medical): No  Physical Activity: Insufficiently Active (05/21/2023)   Exercise Vital Sign    Days of Exercise per Week: 3 days    Minutes of Exercise per Session: 20 min  Stress: No Stress Concern Present (05/21/2023)   [employer] of Occupational Health - Occupational Stress Questionnaire    Feeling of Stress : Not at all  Social Connections: Moderately Isolated (05/21/2023)   Social Connection and Isolation Panel [NHANES]    Frequency of Communication with Friends and Family: Three times a week    Frequency of Social Gatherings with Friends and Family: More than three times a week    Attends Religious Services: More than 4 times per year    Active Member of [employer] or Organizations: No    Attends Banker Meetings: Never    Marital Status: Separated  Intimate Partner Violence: Not At Risk  (05/21/2023)   Humiliation, Afraid, Rape, and Kick questionnaire    Fear of Current or Ex-Partner: No    Emotionally Abused: No    Physically Abused: No    Sexually Abused: No    Review of Systems: See HPI, otherwise negative ROS  Physical Exam: BP 121/78 (BP Location: Right Arm, Patient Position: Sitting, Cuff Size: Normal)   Pulse 87   Temp 98.4 F (36.9 C) (Oral)   Ht 5' 4 (1.626 m)   Wt 182 lb (82.6 kg)   SpO2 97%   BMI 31.24 kg/m  General:   Alert,  Well-developed, well-nourished, pleasant and cooperative in NAD Neck:  Supple; no masses or thyromegaly. No significant cervical adenopathy. Lungs:  Clear throughout to auscultation.   No wheezes, crackles, or rhonchi. No acute distress. Heart:  Regular rate and rhythm; no murmurs, clicks, rubs,  or gallops. Abdomen: Non-distended, normal bowel sounds.  Soft and nontender without appreciable mass or hepatosplenomegaly.   Impression/Plan: Longstanding left-sided proctocolitis now clinically markedly improved on Rinvoq . Need updated labs including lipid profile and a repeat fecal calprotectin.  Recommendations:  Continue Rinvoq   15 mg daily  Need repeat labs including c-Met, CBC, lipid profile and fecal calprotectin.  Will coordinate getting those labs with Dr. Steven upcoming office visit.  Timing of next colonoscopy to be determined  Office visit with us  in 4 months.   Notice: This dictation was prepared with Dragon dictation along with smaller phrase technology. Any transcriptional errors that result from this process are unintentional and may not be corrected upon review.

## 2023-08-29 NOTE — Patient Instructions (Signed)
 It was good to see you again today!  It looks like Rinvoq  is working very well for you at a dose of 15 mg daily  Need repeat labs including c-Met, CBC, lipid profile and fecal calprotectin.  Will coordinate getting those labs with Dr. Steven upcoming office visit.  Timing of next colonoscopy to be determined  Office visit with us  in 4 months.

## 2023-09-11 ENCOUNTER — Ambulatory Visit: Payer: BC Managed Care – PPO | Admitting: Family Medicine

## 2023-09-11 VITALS — BP 122/64 | HR 107 | Temp 97.9°F | Ht 64.0 in | Wt 180.0 lb

## 2023-09-11 DIAGNOSIS — E785 Hyperlipidemia, unspecified: Secondary | ICD-10-CM

## 2023-09-11 DIAGNOSIS — E1169 Type 2 diabetes mellitus with other specified complication: Secondary | ICD-10-CM | POA: Diagnosis not present

## 2023-09-11 DIAGNOSIS — I10 Essential (primary) hypertension: Secondary | ICD-10-CM | POA: Diagnosis not present

## 2023-09-11 DIAGNOSIS — Z7984 Long term (current) use of oral hypoglycemic drugs: Secondary | ICD-10-CM

## 2023-09-11 DIAGNOSIS — E119 Type 2 diabetes mellitus without complications: Secondary | ICD-10-CM

## 2023-09-11 NOTE — Progress Notes (Signed)
   Subjective:    Patient ID: Beth Hamilton, female    DOB: 11-Apr-1956, 68 y.o.   MRN: [id number]  Discussed the use of AI scribe software for clinical note transcription with the patient, who gave verbal consent to proceed.  History of Present Illness   Beth Hamilton is a 68 year old female with ulcerative colitis and diabetes who presents for medication management .  She has experienced significant improvement in her ulcerative colitis symptoms since starting Rinvoq in September or October. Her bowel movements are mostly formed, with occasional loose stools, but no diarrhea or bloody stools. She is tolerating Rinvoq well without any negative side effects, and her insurance covers the cost, resulting in no out-of-pocket expenses.  She is interested in discussing diabetes options, particularly the use of medications like Ozempic or Mounjaro, to help manage her diabetes and potentially reduce her risk for heart and stroke. She is currently on metformin 500 mg once daily.  She is also on lisinopril HCTZ for blood pressure management and rosuvastatin for cholesterol. She wants to reduce the number of medications she takes, particularly aiming to discontinue some if possible. Her blood pressure has been well-controlled.  She has been on a breast cancer medication for ten years and anticipates discontinuing it in the fall as per her oncologist's plan.         Review of Systems     Objective:    Physical Exam        General-in no acute distress Eyes-no discharge Lungs-respiratory rate normal, CTA CV-no murmurs,RRR Extremities skin warm dry no edema Neuro grossly normal Behavior normal, alert Diabetic foot exam completed today       Assessment & Plan:  Assessment and Plan    Ulcerative Colitis Improved symptoms with Rinvoq. No bloody stools or severe diarrhea. -Continue Rinvoq as prescribed by gastroenterologist. -Expect stool test to monitor disease activity.  Type 2  Diabetes and Obesity  discussed potential benefits of Mounjaro for A1c control. -Order A1c test. -Initiate prior approval process for Mounjaro. -If approved and tolerated, gradually increase Mounjaro dose as tolerated. -Plan to adjust blood pressure medication upon initiation of Mounjaro. -Follow-up in 4 months to monitor weight and blood pressure.  Hypertension Well controlled, but may require adjustment with initiation of Mounjaro. -Continue Lisinopril HCTZ until initiation of Mounjaro. -Plan to switch to Lisinopril alone upon initiation of Mounjaro.  Hyperlipidemia On Rosuvastatin. Discussed the importance of statins in reducing cardiovascular risk, especially in diabetics. -Continue Rosuvastatin.  Breast Cancer On endocrine therapy, but plan to discontinue this fall after 10 years of treatment. -Continue current treatment as directed by oncologist.     1. HTN (hypertension), benign (Primary) Blood pressure decent control If able to get on Mounjaro will start backing down on blood pressure medicine in regards to diuretic  2. Diabetes mellitus without complication (HCC) Patient at high risk of heart attack strokes A1c pending Patient using metformin dietary measures currently Mounjaro would help lower your risk of heart attack strokes - Hemoglobin A1c  3. Hyperlipidemia associated with type 2 diabetes mellitus (HCC) Continue statin check labs await results   Follow-up 6 months

## 2023-09-11 NOTE — Patient Instructions (Signed)
Discussion of GLP-1 medications Please remember these medications are to assist with weight reduction and diabetes control.  They do not take the place of healthy eating and regular physical activity. There are several GLP-1 medications that can help with weight reduction and diabetes control.  GLP-1 medications with indication for diabetes-Trulicity, Victoza, Bydureon , Mounjaro, Ozempic, and Rybelsus (although these are injected medicines except for Rybelsus which is a pill) GLP-1 medications with indications for weight loss-Wegovy, and Saxenda  Mechanism of action  These medications stimulate glucagon peptide receptors.  By doing so it does the following:  1-slows down stomach emptying-essentially food takes longer to go through the stomach and the intestines-this can lessen appetite  2-reduces glucagon secretion from the pancreas-this helps keep blood glucose levels stable between meals  3-increase his insulin release from the pancreas-with diabetics this helps keep blood glucose levels stable  4-promotes the feeling of being full in the brain-encouraged him receptors in the brain received the signal so the brain and body know that it is time to stop eating Benefits of the medication  1-reduced weight-reduction of weight is more significant at higher dosing.  Not as much weight reduction with Rybelsus   A- typically Wegovy at higher doses can assist with significant weight reduction.  2-improved blood glucose levels-with diabetics typically we see a significant drop with A1c when the medication is adjusted appropriately.  3-decrease risk of heart attacks and strokes-individuals with type 2 diabetes are at increased risk of heart attacks and strokes.  These GLP-1 medications can reduce the risk by 22%.  Risk of GLP-1 medications-these are some of the risks.  It is important to talk with your provider in a shared discussion before starting any medication.  Contraindications to GLP-1  medications-in other words who should not take these.  1-individuals with history of thyroid medullary cancer  2-individuals with family history of multiple endocrine neoplasm syndrome type II (MEN 2)  3-individuals with a history of pancreatitis  4-those with a history of severe hypersensitivity or allergy reactions to GLP-1 medications  5-should be avoided in individuals with a history of suicidal attempts or active suicidal ideation.  Cautions include- 1- risk of thyroid C-cell tumors were seen in rats during clinical testing in humans the relevancy of this information has not been determined 2-risk of pancreatitis including fatal and nonfatal hemorrhagic or necrotizing pancreatitis 3-gallbladder disease slightly increased risk of gallstones 4-hypoglycemia-more common with individuals who are on insulin or sulfonylurea such as glipizide 5-acute kidney injury or worsening of chronic renal failure often this is triggered by severe vomiting and diarrhea as side effects to GLP-1. 6-slightly increased risk of diabetic retinopathy complications in patients with type 2 diabetes 7-heart rate increase-slight increase of base heart rate with GLP-1 8-suicidal behavior and ideation has been reported in clinical trials with other weight loss medicines.  If depression occurs with medication or suicidal ideation stop medication immediately notify provider or seek help.  Common side effects include nausea, vomiting, diarrhea, constipation and increased heart rate.  Less common side effects severe abdominal pain-if this occurs notify provider stop medication get tested for pancreatitis. Typically weight loss with GLP-1 medicines can be anywhere between 10% weight loss all the way up to closer to 20% weight loss.  Greater weight loss occurs with the higher dosing.  Increase potential for side effects also increase with higher dosing including nausea.  Typically the weight loss occurs gradually over 1 years time.   In individuals who are no longer able to  take the medication or choose to stop the medication typically regain at least 90% of the weight that they have lost over the next 6 months.   In other words this medication should not be utilized as a as needed weight loss medication.  In other words something a person uses for 5 to 6 months to help him/her lose weight and then stop the medication and "maintain the loss by healthy eating alone".  This study showed that people regain your weight when they stop the medicine.  Currently the scientific view of this medication is becomes a lifelong medication.  That can be a big challenge for many people.  And for many individuals a economic challenge. Full discussion with your provider should occur before starting these medicines.  Should you desire additional detailed discussion we would recommend an office visit to sit down and discuss further.

## 2023-09-13 ENCOUNTER — Encounter: Payer: Self-pay | Admitting: Family Medicine

## 2023-09-13 LAB — HEMOGLOBIN A1C
Est. average glucose Bld gHb Est-mCnc: 123 mg/dL
Hgb A1c MFr Bld: 5.9 % — ABNORMAL HIGH (ref 4.8–5.6)

## 2023-09-13 MED ORDER — TIRZEPATIDE 2.5 MG/0.5ML ~~LOC~~ SOAJ: 2.5000 mg | SUBCUTANEOUS | 3 refills | Status: DC

## 2023-09-13 NOTE — Progress Notes (Signed)
 Please mail to patient

## 2023-09-13 NOTE — Addendum Note (Signed)
 Addended by: Lilyan Punt A on: 09/13/2023 10:49 AM   Modules accepted: Orders

## 2023-09-15 ENCOUNTER — Other Ambulatory Visit: Payer: Self-pay

## 2023-09-15 DIAGNOSIS — K5190 Ulcerative colitis, unspecified, without complications: Secondary | ICD-10-CM

## 2023-09-16 LAB — CBC WITH DIFFERENTIAL/PLATELET
Basophils Absolute: 0 10*3/uL (ref 0.0–0.2)
Basos: 0 %
EOS (ABSOLUTE): 0 10*3/uL (ref 0.0–0.4)
Eos: 1 %
Hematocrit: 38.5 % (ref 34.0–46.6)
Hemoglobin: 13.2 g/dL (ref 11.1–15.9)
Immature Grans (Abs): 0 10*3/uL (ref 0.0–0.1)
Immature Granulocytes: 0 %
Lymphocytes Absolute: 2.2 10*3/uL (ref 0.7–3.1)
Lymphs: 30 %
MCH: 35.4 pg — ABNORMAL HIGH (ref 26.6–33.0)
MCHC: 34.3 g/dL (ref 31.5–35.7)
MCV: 103 fL — ABNORMAL HIGH (ref 79–97)
Monocytes Absolute: 0.7 10*3/uL (ref 0.1–0.9)
Monocytes: 10 %
Neutrophils Absolute: 4.2 10*3/uL (ref 1.4–7.0)
Neutrophils: 59 %
Platelets: 304 10*3/uL (ref 150–450)
RBC: 3.73 x10E6/uL — ABNORMAL LOW (ref 3.77–5.28)
RDW: 13.1 % (ref 11.7–15.4)
WBC: 7.2 10*3/uL (ref 3.4–10.8)

## 2023-09-16 LAB — COMPREHENSIVE METABOLIC PANEL
ALT: 34 IU/L — ABNORMAL HIGH (ref 0–32)
AST: 33 IU/L (ref 0–40)
Albumin: 4.8 g/dL (ref 3.9–4.9)
Alkaline Phosphatase: 56 IU/L (ref 44–121)
BUN/Creatinine Ratio: 20 (ref 12–28)
BUN: 15 mg/dL (ref 8–27)
Bilirubin Total: 1.1 mg/dL (ref 0.0–1.2)
CO2: 23 mmol/L (ref 20–29)
Calcium: 9.7 mg/dL (ref 8.7–10.3)
Chloride: 98 mmol/L (ref 96–106)
Creatinine, Ser: 0.75 mg/dL (ref 0.57–1.00)
Globulin, Total: 2.5 g/dL (ref 1.5–4.5)
Glucose: 105 mg/dL — ABNORMAL HIGH (ref 70–99)
Potassium: 4.3 mmol/L (ref 3.5–5.2)
Sodium: 140 mmol/L (ref 134–144)
Total Protein: 7.3 g/dL (ref 6.0–8.5)
eGFR: 88 mL/min/{1.73_m2} (ref >59)

## 2023-09-16 LAB — LIPID PANEL
Chol/HDL Ratio: 2.7 ratio (ref 0.0–4.4)
Cholesterol, Total: 170 mg/dL (ref 100–199)
HDL: 62 mg/dL (ref >39)
LDL Chol Calc (NIH): 83 mg/dL (ref 0–99)
Triglycerides: 144 mg/dL (ref 0–149)
VLDL Cholesterol Cal: 25 mg/dL (ref 5–40)

## 2023-09-16 LAB — CALPROTECTIN, FECAL: Calprotectin, Fecal: 106 ug/g (ref 0–120)

## 2023-10-06 ENCOUNTER — Other Ambulatory Visit: Payer: Self-pay | Admitting: Family Medicine

## 2023-10-06 MED ORDER — TIRZEPATIDE 2.5 MG/0.5ML ~~LOC~~ SOAJ: 2.5000 mg | SUBCUTANEOUS | 3 refills | Status: DC

## 2023-10-06 NOTE — Telephone Encounter (Signed)
 Copied from CRM [id number]. Topic: Clinical - Medication Refill >> Oct 06, 2023  8:56 AM Truddie Crumble wrote: Most Recent Primary Care Visit:  Provider: Lilyan Punt A  Department: [facility] MED  Visit Type: OFFICE VISIT  Date: 09/11/2023  Medication: tirzepatide [facility]) 2.5 MG/0.5ML Pen (patient needs an increase in her mounjaro)  Has the patient contacted their pharmacy? No (Agent: If no, request that the patient contact the pharmacy for the refill. If patient does not wish to contact the pharmacy document the reason why and proceed with request.) (Agent: If yes, when and what did the pharmacy advise?)  Is this the correct pharmacy for this prescription? Yes If no, delete pharmacy and type the correct one.  This is the patient's preferred pharmacy:  [id number] - [place], [place] - [address] AT [facility] [address] [place] [address] Phone: [phone] Fax: [phone]  Has the prescription been filled recently? Yes  Is the patient out of the medication? Yes  Has the patient been seen for an appointment in the last year OR does the patient have an upcoming appointment? Yes  Can we respond through MyChart? No  Agent: Please be advised that Rx refills may take up to 3 business days. We ask that you follow-up with your pharmacy.

## 2023-10-07 ENCOUNTER — Other Ambulatory Visit: Payer: Self-pay

## 2023-10-07 NOTE — Telephone Encounter (Signed)
 Patient calling back, patient requesting next dosage for the [facility] not the 2.5mg . Patient states she was instructed by provider if doing well on 2.5mg  dosage to call back an request increase.   Patient requesting call back if unable to increase, [phone]

## 2023-10-10 ENCOUNTER — Telehealth: Payer: Self-pay

## 2023-10-10 NOTE — Telephone Encounter (Signed)
 Please advise. Patient would like to increase to next dosage of Mounjaro.   Patient calling back, patient requesting next dosage for the [facility] not the 2.5mg . Patient states she was instructed by provider if doing well on 2.5mg  dosage to call back an request increase.    Patient requesting call back if unable to increase, [phone]

## 2023-10-12 NOTE — Telephone Encounter (Signed)
 Nurses May send in the 5 mg Mounjaro Once per week 1 month with 3 refills Patient to keep all regular follow-up visits

## 2023-10-13 ENCOUNTER — Other Ambulatory Visit: Payer: Self-pay

## 2023-10-13 ENCOUNTER — Telehealth: Payer: Self-pay

## 2023-10-13 DIAGNOSIS — E119 Type 2 diabetes mellitus without complications: Secondary | ICD-10-CM

## 2023-10-13 MED ORDER — TIRZEPATIDE 5 MG/0.5ML ~~LOC~~ SOAJ: 5.0000 mg | SUBCUTANEOUS | 3 refills | Status: DC

## 2023-10-13 NOTE — Telephone Encounter (Signed)
 Communication  Reason for CRM: patient calling in to check on the status of mounjaro. She was advised by the pharmacy they did not have it for her. CAL confirmed it would need a prior auth since she is going up in dosage. I relayed that information to the patient and let her know the office intends to call her back today in regard to this mediation. Callback [phone]

## 2023-10-13 NOTE — Telephone Encounter (Signed)
 It does not require another PA

## 2023-10-14 ENCOUNTER — Other Ambulatory Visit: Payer: Self-pay

## 2023-10-14 DIAGNOSIS — K5190 Ulcerative colitis, unspecified, without complications: Secondary | ICD-10-CM

## 2023-10-15 ENCOUNTER — Other Ambulatory Visit: Payer: Self-pay

## 2023-10-15 DIAGNOSIS — E119 Type 2 diabetes mellitus without complications: Secondary | ICD-10-CM

## 2023-10-15 MED ORDER — TIRZEPATIDE 5 MG/0.5ML ~~LOC~~ SOAJ: 5.0000 mg | SUBCUTANEOUS | 3 refills | Status: DC

## 2023-10-16 ENCOUNTER — Other Ambulatory Visit: Payer: Self-pay | Admitting: Family Medicine

## 2023-10-16 DIAGNOSIS — I10 Essential (primary) hypertension: Secondary | ICD-10-CM

## 2023-10-28 ENCOUNTER — Other Ambulatory Visit: Payer: Self-pay | Admitting: Family Medicine

## 2023-10-28 DIAGNOSIS — M10079 Idiopathic gout, unspecified ankle and foot: Secondary | ICD-10-CM

## 2023-10-28 DIAGNOSIS — I10 Essential (primary) hypertension: Secondary | ICD-10-CM

## 2023-10-30 ENCOUNTER — Other Ambulatory Visit: Payer: Self-pay | Admitting: Physician Assistant

## 2023-10-31 LAB — HEPATIC FUNCTION PANEL
ALT: 38 IU/L — ABNORMAL HIGH (ref 0–32)
AST: 31 IU/L (ref 0–40)
Albumin: 4.7 g/dL (ref 3.9–4.9)
Alkaline Phosphatase: 49 IU/L (ref 44–121)
Bilirubin Total: 0.8 mg/dL (ref 0.0–1.2)
Bilirubin, Direct: 0.26 mg/dL (ref 0.00–0.40)
Total Protein: 7.2 g/dL (ref 6.0–8.5)

## 2023-11-03 ENCOUNTER — Other Ambulatory Visit (HOSPITAL_COMMUNITY): Payer: Self-pay | Admitting: Hematology

## 2023-11-03 DIAGNOSIS — R921 Mammographic calcification found on diagnostic imaging of breast: Secondary | ICD-10-CM

## 2023-11-05 ENCOUNTER — Other Ambulatory Visit: Payer: Self-pay

## 2023-11-05 DIAGNOSIS — K5100 Ulcerative (chronic) pancolitis without complications: Secondary | ICD-10-CM

## 2023-11-06 ENCOUNTER — Telehealth: Payer: Self-pay | Admitting: *Deleted

## 2023-11-06 DIAGNOSIS — E119 Type 2 diabetes mellitus without complications: Secondary | ICD-10-CM

## 2023-11-06 DIAGNOSIS — Z79899 Other long term (current) drug therapy: Secondary | ICD-10-CM

## 2023-11-06 NOTE — Telephone Encounter (Signed)
 Nurses- according to our clinical pharmacist who works with insurance companies when the A1c is under good [employer] companies will not approve going up on the dose.  Her A1c is below 6.5 and was actually very good on the last check therefore I would recommend staying with the current dosing. Please make sure she has enough refills to get her through June/July  Please order liver, A1c before her next follow-up visit she can do that right before that visit thank you

## 2023-11-06 NOTE — Telephone Encounter (Signed)
 Copied from CRM [id number]. Topic: General - Other >> Nov 06, 2023  2:31 PM Tiffany S wrote: Reason for CRM: Patient stated she is doing fine on the 5mg  and is ready to have her dosage  increased.

## 2023-11-07 NOTE — Telephone Encounter (Signed)
 Patient notified and verbalized understanding and will stick with current dose Blood work ordered in [employer]

## 2023-11-08 ENCOUNTER — Ambulatory Visit
Admission: EM | Admit: 2023-11-08 | Discharge: 2023-11-08 | Disposition: A | Discharge status: Home / Self Care | Attending: Family Medicine | Admitting: Family Medicine

## 2023-11-08 DIAGNOSIS — H65191 Other acute nonsuppurative otitis media, right ear: Secondary | ICD-10-CM

## 2023-11-08 MED ORDER — PREDNISONE 20 MG PO TABS: 40.0000 mg | ORAL_TABLET | Freq: Every day | ORAL | 0 refills | Status: DC

## 2023-11-08 MED ORDER — CORICIDIN HBP 10-325-2 MG PO TABS: 1.0000 | ORAL_TABLET | Freq: Three times a day (TID) | ORAL | 0 refills | Status: DC | PRN

## 2023-11-08 MED ORDER — AZELASTINE HCL 0.1 % NA SOLN: 1.0000 | Freq: Two times a day (BID) | NASAL | 0 refills | Status: DC

## 2023-11-08 NOTE — ED Provider Notes (Signed)
 [facility]    CSN: [id number] Arrival date & time: 11/08/23  0941      History   Chief Complaint No chief complaint on file.   HPI Beth Hamilton is a 68 y.o. female.   Patient presenting today with 2-day history of right ear pain, fullness.  Denies congestion, fever, drainage or bleeding from the ear, loss of hearing, headache.  So far not trying anything over-the-counter for symptoms.    Past Medical History:  Diagnosis Date   Breast cancer (HCC) 04/20/14   left breast   Complication of anesthesia    not sure what med was given in 96 with D&C, was very sore all over. Dr Viva Grise did anesthesia. Dr Arlyss Laming did surgery   Gout    Hemorrhoids    History of breast cancer 05/19/2015   Hypertension    Hypothyroidism    IBD (inflammatory bowel disease)    UC   Personal history of radiation therapy    2015   Pneumonia    Psoriasis    Thyroid  disease    Ulcerative colitis (HCC) 5 1 2007   Dr Riley Cheadle    Patient Active Problem List   Diagnosis Date Noted   Diabetes mellitus without complication (HCC) 09/10/2022   Postmenopause 05/05/2020   Encounter for screening fecal occult blood testing 05/05/2020   Encounter for well woman exam with routine gynecological exam 05/05/2020   IBD (inflammatory bowel disease)    Fasting hyperglycemia 06/14/2018   Hypertension 05/29/2018   Screening for colorectal cancer 05/29/2018   Encounter for gynecological examination with Papanicolaou smear of cervix 05/29/2018   Well woman exam with routine gynecological exam 05/27/2017   Positive fecal occult blood test 05/27/2017   FUO (fever of unknown origin) 08/21/2015   Hyperlipidemia associated with type 2 diabetes mellitus (HCC) 06/07/2015   History of breast cancer 05/19/2015   Hypothyroidism 12/07/2014   Osteoporosis 12/07/2014   Breast cancer of lower-outer quadrant of left female breast (HCC) 05/24/2014   HTN (hypertension), benign 11/03/2013   UC (ulcerative colitis)  (HCC) 06/30/2009   Hemorrhoids 03/01/2009   DIARRHEA 03/01/2009   Abdominal pain 03/01/2009    Past Surgical History:  Procedure Laterality Date   BIOPSY  08/02/2019   Procedure: BIOPSY;  Surgeon: Suzette Espy, MD;  [facility];  Service: Endoscopy;;  colon   BIOPSY  03/10/2023   Procedure: BIOPSY;  Surgeon: Suzette Espy, MD;  [facility]: [facility];  Service: Endoscopy;;   BREAST BIOPSY     BREAST BIOPSY Left 06/13/2023   MM LT BREAST BX W LOC DEV 1ST LESION IMAGE BX SPEC STEREO GUIDE 06/13/2023 [facility]   BREAST LUMPECTOMY Left 04/20/2014   COLONOSCOPY  10/09/2001   RMR: Internal hemorrhoids and anal papilla; otherwise normal rectum   COLONOSCOPY  12/10/2005   incomplete tcs-  colitis   COLONOSCOPY N/A 04/18/2014   Dr.Rourk- abnormal rectum & L colon into the mid descending segment. mucosa was erythematious as well as pale w/ some loss of normal vascular apttern. no erosions or ulcers. pt had some smooth peduculated 3-4cm polyps in the sigmoid segment most c/w pseudopolyps. the rest of the colonic mucosa appeared normal. bx= inflammatory polyps   COLONOSCOPY WITH PROPOFOL  N/A 08/02/2019   Procedure: COLONOSCOPY WITH PROPOFOL ;  Surgeon: Suzette Espy, MD;  [facility]: [facility];  Service: Endoscopy;  Laterality: N/A;  8:30am   COLONOSCOPY WITH PROPOFOL  N/A 03/10/2023   Procedure: COLONOSCOPY WITH PROPOFOL ;  Surgeon: Riley Cheadle,  Windsor Hatcher, MD;  Location: [facility];  Service: Endoscopy;  Laterality: N/A;  7:30 am, asa 2   DILATION AND CURETTAGE OF UTERUS  1996   Dr Arlyss Laming   FLEXIBLE SIGMOIDOSCOPY  02/26/2006   endoscopically normal-appearing rectum, colitis in sigmoid mucosa   PARTIAL MASTECTOMY WITH NEEDLE LOCALIZATION AND AXILLARY SENTINEL LYMPH NODE BX Left 04/20/2014   Procedure: PARTIAL MASTECTOMY AFTER NEEDLE LOCALIZATION AND AXILLARY SENTINEL LYMPH NODE BX;  Surgeon: Beau Bound, MD;  Location: [facility];  Service: General;  Laterality: Left;   POLYPECTOMY   03/10/2023   Procedure: POLYPECTOMY;  Surgeon: Suzette Espy, MD;  [facility]: [facility];  Service: Endoscopy;;   TUBAL LIGATION     WISDOM TOOTH EXTRACTION      OB History     Gravida  2   Para  1   Term  1   Preterm      AB  1   Living  1      SAB  1   IAB      Ectopic      Multiple      Live Births  1            Home Medications    Prior to Admission medications   Medication Sig Start Date End Date Taking? Authorizing Provider  azelastine  (ASTELIN ) 0.1 % nasal spray Place 1 spray into both nostrils 2 (two) times daily. Use in each nostril as directed 11/08/23  Yes Corbin Dess, PA-C  DM-APAP-CPM (CORICIDIN HBP) 10-325-2 MG TABS Take 1 tablet by mouth 3 (three) times daily as needed. 11/08/23  Yes Corbin Dess, PA-C  predniSONE  (DELTASONE ) 20 MG tablet Take 2 tablets (40 mg total) by mouth daily with breakfast. 11/08/23  Yes Corbin Dess, PA-C  acyclovir  ointment (ZOVIRAX ) 5 % Apply 1 Application topically every 4 (four) hours as needed. 09/10/22   Bennet Brasil, MD  alendronate  (FOSAMAX ) 70 MG tablet TAKE 1 TABLET (70 MG TOTAL) BY MOUTH ONCE A WEEK. TAKE WITH A FULL GLASS OF WATER  ON AN EMPTY STOMACH. 06/18/23   Paulett Boros, MD  allopurinol  (ZYLOPRIM ) 300 MG tablet TAKE 1 TABLET BY MOUTH EVERY DAY 10/28/23   Bennet Brasil, MD  amLODipine  (NORVASC ) 5 MG tablet TAKE 1 TABLET (5 MG TOTAL) BY MOUTH DAILY. 10/16/23   Bennet Brasil, MD  anastrozole  (ARIMIDEX ) 1 MG tablet TAKE 1 TABLET BY MOUTH EVERY DAY 07/28/23   Paulett Boros, MD  ARMOUR THYROID  60 MG tablet Take 60 mg by mouth daily. 1/2 pill on Sat and Sun. 11/16/18   [provider]  augmented betamethasone dipropionate (DIPROLENE-AF) 0.05 % cream Apply topically. Patient not taking: Reported on 09/11/2023 02/04/23   [provider]  B Complex Vitamins (VITAMIN-B COMPLEX PO) Take 1 mL by mouth daily. Vit b12    [provider]  Calcium   Citrate-Vitamin D  (CITRACAL + D PO) Take by mouth.    [provider]  fluticasone  (CUTIVATE ) 0.05 % cream APPLY TO AFFECTED AREAS TWICE DAILY. Patient not taking: Reported on 09/11/2023 12/18/18   Bennet Brasil, MD  folic acid  (FOLVITE ) 400 MCG tablet Take 400 mcg by mouth daily.     [provider]  hyoscyamine  (LEVSIN SL) 0.125 MG SL tablet Place one tablet under tongue up to four times daily as needed for cramping. Patient not taking: Reported on 09/11/2023 05/12/23   Lanney Pitts, PA-C  lisinopril -hydrochlorothiazide  (ZESTORETIC ) 20-25 MG tablet TAKE 1  TABLET BY MOUTH EVERY DAY 10/28/23   Bennet Brasil, MD  metFORMIN  (GLUCOPHAGE ) 500 MG tablet TAKE 1 TABLET BY MOUTH EVERY DAY 07/28/23   Bennet Brasil, MD  rosuvastatin  (CRESTOR ) 10 MG tablet TAKE 1 TABLET BY MOUTH EVERY DAY 07/28/23   Bennet Brasil, MD  tirzepatide [facility]) 5 MG/0.5ML Pen Inject 5 mg into the skin once a week. 10/15/23   Bennet Brasil, MD  Upadacitinib  ER (RINVOQ ) 15 MG TB24 Take 1 tablet (15 mg total) by mouth daily. 06/23/23   Rourk, Windsor Hatcher, MD    Family History Family History  Problem Relation Age of Onset   Stroke Maternal Grandfather    Heart attack Maternal Grandfather    Hypertension Mother    Diabetes Paternal Grandfather    Heart disease Paternal Grandfather    Colon cancer Neg Hx     Social History Social History   Tobacco Use   Smoking status: Former    Current packs/day: 0.00    Types: Cigarettes    Quit date: 07/26/1990    Years since quitting: 33.3   Smokeless tobacco: Never   Tobacco comments:    quit in 2007  Vaping Use   Vaping status: Never Used  Substance Use Topics   Alcohol use: Yes    Alcohol/week: 14.0 standard drinks of alcohol    Types: 14 Glasses of wine per week    Comment: wine every day   Drug use: No     Allergies   Flagyl [metronidazole], Other, and Levaquin [levofloxacin in d5w]   Review of Systems Review of Systems Per HPI  Physical  Exam Triage Vital Signs ED Triage Vitals [11/08/23 1021]  Encounter Vitals Group     BP 134/79     Systolic BP Percentile      Diastolic BP Percentile      Pulse Rate 96     Resp 20     Temp 98.5 F (36.9 C)     Temp Source Oral     SpO2 95 %     Weight      Height      Head Circumference      Peak Flow      Pain Score 6     Pain Loc      Pain Education      Exclude from Growth Chart    No data found.  Updated Vital Signs BP 134/79 (BP Location: Right Arm)   Pulse 96   Temp 98.5 F (36.9 C) (Oral)   Resp 20   SpO2 95%   Visual Acuity Right Eye Distance:   Left Eye Distance:   Bilateral Distance:    Right Eye Near:   Left Eye Near:    Bilateral Near:     Physical Exam Vitals and nursing note reviewed.  Constitutional:      Appearance: Normal appearance. She is not ill-appearing.  HENT:     Head: Atraumatic.     Ears:     Comments: Right middle ear effusion    Nose: Nose normal.     Mouth/Throat:     Mouth: Mucous membranes are moist.     Pharynx: Oropharynx is clear.  Eyes:     Extraocular Movements: Extraocular movements intact.     Conjunctiva/sclera: Conjunctivae normal.  Cardiovascular:     Rate and Rhythm: Normal rate and regular rhythm.     Heart sounds: Normal heart sounds.  Pulmonary:     Effort: Pulmonary effort is  normal.     Breath sounds: Normal breath sounds.  Musculoskeletal:        General: Normal range of motion.     Cervical back: Normal range of motion and neck supple.  Skin:    General: Skin is warm and dry.  Neurological:     Mental Status: She is alert and oriented to person, place, and time.  Psychiatric:        Mood and Affect: Mood normal.        Thought Content: Thought content normal.        Judgment: Judgment normal.      UC Treatments / Results  Labs (all labs ordered are listed, but only abnormal results are displayed) Labs Reviewed - No data to display  EKG   Radiology No results  found.  Procedures Procedures (including critical care time)  Medications Ordered in UC Medications - No data to display  Initial Impression / Assessment and Plan / UC Course  I have reviewed the triage vital signs and the nursing notes.  Pertinent labs & imaging results that were available during my care of the patient were reviewed by me and considered in my medical decision making (see chart for details).     Vital signs and exam reassuring today, suspect middle ear effusion causing her pain.  Discussed short course of prednisone , Astelin  nasal spray, decongestants, over-the-counter pain relievers.  Return for worsening symptoms.  Final Clinical Impressions(s) / UC Diagnoses   Final diagnoses:  Acute MEE (middle ear effusion), right     Discharge Instructions      I have sent over a short course of prednisone , a decongestant and some nasal spray to help with the fluid pressure built up within the inner ear.  May also take Tylenol  as needed for pain until this improves.    ED Prescriptions     Medication Sig Dispense Auth. Provider   predniSONE  (DELTASONE ) 20 MG tablet Take 2 tablets (40 mg total) by mouth daily with breakfast. 6 tablet Corbin Dess, PA-C   azelastine  (ASTELIN ) 0.1 % nasal spray Place 1 spray into both nostrils 2 (two) times daily. Use in each nostril as directed 30 mL Corbin Dess, PA-C   DM-APAP-CPM (CORICIDIN HBP) 10-325-2 MG TABS Take 1 tablet by mouth 3 (three) times daily as needed. 15 tablet Corbin Dess, [place]      PDMP not reviewed this encounter.   Corbin Dess, [place] 11/08/23 1106

## 2023-11-08 NOTE — ED Triage Notes (Signed)
 Pt reports she has right ear pain x 2 days    Took tylenol 

## 2023-11-08 NOTE — Discharge Instructions (Addendum)
 I have sent over a short course of prednisone , a decongestant and some nasal spray to help with the fluid pressure built up within the inner ear.  May also take Tylenol  as needed for pain until this improves.

## 2023-11-11 ENCOUNTER — Ambulatory Visit: Payer: Self-pay

## 2023-11-11 NOTE — Telephone Encounter (Signed)
  Chief Complaint: earache Symptoms: pain  Disposition: [] ED /[] Urgent Care (no appt availability in office) / [x] Appointment(In office/virtual)/ []  [facility] [facility]/ [] Home Care/ [] Refused Recommended Disposition /[]  Mobile Bus/ []  Follow-up with PCP Additional Notes: Pt called with right earache. Pt went to [facility] Saturday and was told she had fluid on her ear. Pt stated she was given Prednisone . Pt feels the pain is worse and thinks ear is now infected. Pt denies fever,drainage,runny nose/cough.  Pt rated pain 6-7/10. Pt denied loss of hearing but it does "sound stopped up." Pt only wants to see PCP. Pt has appt 4/23 @ 1040. RN gave care advice and pt verbalized understanding.            Copied from CRM [id number]. Topic: Clinical - Red Word Triage >> Nov 11, 2023  8:42 AM Elle L wrote: Red Word that prompted transfer to Nurse Triage: The patient went to Urgent Care on Saturday for fluid in her inner ear and was prescribed Prednisone  for 3 days. However, the pain is worsening and she is concerned that it is infected. Reason for Disposition  Earache  (Exceptions: brief ear pain of < 60 minutes duration, earache occurring during air travel  Answer Assessment - Initial Assessment Questions 1. LOCATION: "Which ear is involved?"     right 2. ONSET: "When did the ear start hurting"      Thursday 4/17 3. SEVERITY: "How bad is the pain?"  (Scale 1-10; mild, moderate or severe)   - MILD (1-3): doesn't interfere with normal activities    - MODERATE (4-7): interferes with normal activities or awakens from sleep    - SEVERE (8-10): excruciating pain, unable to do any normal activities      6-7 4. URI SYMPTOMS: "Do you have a runny nose or cough?"     denies 5. FEVER: "Do you have a fever?" If Yes, ask: "What is your temperature, how was it measured, and when did it start?"     denies 6. CAUSE: "Have you been swimming recently?", "How often do you use Q-TIPS?", "Have you had any  recent air travel or scuba diving?"     denies 7. OTHER SYMPTOMS: "Do you have any other symptoms?" (e.g., headache, stiff neck, dizziness, vomiting, runny nose, decreased hearing)     Stopped up feeling  Protocols used: Louie Rover

## 2023-11-12 ENCOUNTER — Ambulatory Visit: Admitting: Family Medicine

## 2023-11-12 VITALS — BP 129/79 | HR 88 | Temp 97.7°F | Ht 64.0 in | Wt 178.0 lb

## 2023-11-12 DIAGNOSIS — H9201 Otalgia, right ear: Secondary | ICD-10-CM | POA: Diagnosis not present

## 2023-11-12 DIAGNOSIS — E1159 Type 2 diabetes mellitus with other circulatory complications: Secondary | ICD-10-CM

## 2023-11-12 DIAGNOSIS — Z7985 Long-term (current) use of injectable non-insulin antidiabetic drugs: Secondary | ICD-10-CM

## 2023-11-12 DIAGNOSIS — E119 Type 2 diabetes mellitus without complications: Secondary | ICD-10-CM

## 2023-11-12 MED ORDER — TIRZEPATIDE 7.5 MG/0.5ML ~~LOC~~ SOAJ: 7.5000 mg | SUBCUTANEOUS | 5 refills | Status: DC

## 2023-11-12 MED ORDER — NAPROXEN 500 MG PO TABS: 500.0000 mg | ORAL_TABLET | Freq: Two times a day (BID) | ORAL | 2 refills | Status: DC

## 2023-11-12 MED ORDER — CEPHALEXIN 500 MG PO CAPS: 500.0000 mg | ORAL_CAPSULE | Freq: Three times a day (TID) | ORAL | 0 refills | Status: DC

## 2023-11-12 NOTE — Progress Notes (Signed)
   Subjective:    Patient ID: Beth Hamilton, female    DOB: 1956/01/29, 68 y.o.   MRN: [id number]  HPI Pt come sin today for Right ear pain that started on Thursday 11/06/2023 with a dull ache and then started worsening on Saturday 11/08/2023. Pt went to urgent care on Saturday 11/08/2023 and received prednisone  that she finished yesterday. Pt states ear pain is not improving. Medications and allergies reviewed. Discussed the use of AI scribe software for clinical note transcription with the patient, who gave verbal consent to proceed.  History of Present Illness   Beth Hamilton is a 68 year old female who presents with right ear pain and discomfort.  Symptoms began mid-morning on Thursday with a 'little thump' in the right ear, progressing to a 'stab, jab' sensation, particularly severe in the mornings. The pain is intermittent, non-radiating, and does not affect the forehead.  No head congestion, stuffiness, or recent infections. No prior history of ear problems. She experiences a sensation of fullness in the right ear without hearing loss.  Prednisone  was prescribed for three days without symptom relief. She uses Motrin and Tylenol  for pain and applies heat with a rice-filled sock, which provides some relief.  She feels lightheaded and notes a sensation of drainage from the ear. No numbness or tingling in the face.  She is currently taking Mounjaro for diabetes management. She is mindful of her diet.     Patient has diabetes Trying to watch her diet States at times her sugars are running a little high She would like to try a higher dose to see if they will get her sugars under better control She states the medicine does help her with managing her diabetes  Review of Systems     Objective:   Physical Exam General-in no acute distress Eyes-no discharge Lungs-respiratory rate normal, CTA CV-no murmurs,RRR Extremities skin warm dry no edema Neuro grossly normal Behavior  normal, alert  Patient does have some sharp pains in the right ear with pressure especially with moving jaw side-to-side eardrum itself looks good      Assessment & Plan:   Diabetes-borderline control Reasonable to increase the dose of the medicine I did tell the patient know that it is often determined by the insurance company or whether or not they approve the increased doses She is to do her lab work later this spring and do a follow-up office visit as planned  Right ear pain-I find no evidence of any type of otitis effusion currently I think is reasonable given her symptoms to treat with an anti-inflammatory for 7 to 10 days and a short course of antibiotics if she does not see improvement with this we will need to refer her to ENT

## 2023-11-18 ENCOUNTER — Other Ambulatory Visit: Payer: Self-pay

## 2023-11-18 DIAGNOSIS — H9209 Otalgia, unspecified ear: Secondary | ICD-10-CM

## 2023-11-27 ENCOUNTER — Encounter: Payer: Self-pay | Admitting: Gastroenterology

## 2023-11-27 ENCOUNTER — Encounter (INDEPENDENT_AMBULATORY_CARE_PROVIDER_SITE_OTHER): Payer: Self-pay | Admitting: Otolaryngology

## 2023-11-30 ENCOUNTER — Other Ambulatory Visit: Payer: Self-pay | Admitting: Family Medicine

## 2023-12-04 ENCOUNTER — Telehealth: Payer: Self-pay | Admitting: Gastroenterology

## 2023-12-04 NOTE — Telephone Encounter (Signed)
 I will be mailing lab orders out for her to have done. She isn't due to have labs done until 01/05/24. Her appt needs to be after that date.

## 2023-12-04 NOTE — Telephone Encounter (Signed)
 Pt received a letter for follow up appointment she she wants to know should she get blood work done before coming in for recall appt?

## 2023-12-05 ENCOUNTER — Encounter (INDEPENDENT_AMBULATORY_CARE_PROVIDER_SITE_OTHER): Payer: Self-pay

## 2023-12-08 NOTE — Telephone Encounter (Signed)
 Pt was scheduled a f/u appt and was informed lab orders will be mailed out in the next couple of weeks.

## 2023-12-16 ENCOUNTER — Ambulatory Visit (HOSPITAL_COMMUNITY)
Admission: RE | Admit: 2023-12-16 | Discharge: 2023-12-16 | Disposition: A | Discharge status: Home / Self Care | Source: Ambulatory Visit | Attending: Hematology | Admitting: Hematology

## 2023-12-16 ENCOUNTER — Encounter (HOSPITAL_COMMUNITY): Payer: Self-pay

## 2023-12-16 DIAGNOSIS — R921 Mammographic calcification found on diagnostic imaging of breast: Secondary | ICD-10-CM

## 2023-12-17 ENCOUNTER — Other Ambulatory Visit (HOSPITAL_COMMUNITY): Payer: Self-pay | Admitting: Hematology

## 2023-12-17 DIAGNOSIS — R928 Other abnormal and inconclusive findings on diagnostic imaging of breast: Secondary | ICD-10-CM

## 2023-12-22 ENCOUNTER — Other Ambulatory Visit: Payer: Self-pay

## 2023-12-22 ENCOUNTER — Ambulatory Visit
Admission: RE | Admit: 2023-12-22 | Discharge: 2023-12-22 | Disposition: A | Discharge status: Home / Self Care | Source: Ambulatory Visit | Attending: Hematology | Admitting: Hematology

## 2023-12-22 DIAGNOSIS — K5100 Ulcerative (chronic) pancolitis without complications: Secondary | ICD-10-CM

## 2023-12-22 DIAGNOSIS — R928 Other abnormal and inconclusive findings on diagnostic imaging of breast: Secondary | ICD-10-CM

## 2023-12-22 HISTORY — PX: BREAST BIOPSY: SHX20

## 2023-12-23 LAB — SURGICAL PATHOLOGY

## 2024-01-05 ENCOUNTER — Other Ambulatory Visit (HOSPITAL_COMMUNITY): Payer: Self-pay | Admitting: Hematology

## 2024-01-05 DIAGNOSIS — Z1231 Encounter for screening mammogram for malignant neoplasm of breast: Secondary | ICD-10-CM

## 2024-01-10 ENCOUNTER — Ambulatory Visit: Payer: Self-pay | Admitting: Family Medicine

## 2024-01-10 LAB — HEPATIC FUNCTION PANEL
ALT: 45 IU/L — ABNORMAL HIGH (ref 0–32)
AST: 33 IU/L (ref 0–40)
Albumin: 4.8 g/dL (ref 3.9–4.9)
Alkaline Phosphatase: 46 IU/L (ref 44–121)
Bilirubin Total: 1.1 mg/dL (ref 0.0–1.2)
Bilirubin, Direct: 0.35 mg/dL (ref 0.00–0.40)
Total Protein: 7.1 g/dL (ref 6.0–8.5)

## 2024-01-10 LAB — HEMOGLOBIN A1C
Est. average glucose Bld gHb Est-mCnc: 117 mg/dL
Hgb A1c MFr Bld: 5.7 % — ABNORMAL HIGH (ref 4.8–5.6)

## 2024-01-13 ENCOUNTER — Ambulatory Visit: Admitting: Internal Medicine

## 2024-01-13 ENCOUNTER — Encounter: Payer: Self-pay | Admitting: Internal Medicine

## 2024-01-13 VITALS — BP 126/76 | HR 101 | Temp 98.0°F | Ht 64.0 in | Wt 169.2 lb

## 2024-01-13 DIAGNOSIS — K5150 Left sided colitis without complications: Secondary | ICD-10-CM | POA: Diagnosis not present

## 2024-01-13 DIAGNOSIS — R7401 Elevation of levels of liver transaminase levels: Secondary | ICD-10-CM

## 2024-01-13 DIAGNOSIS — K5190 Ulcerative colitis, unspecified, without complications: Secondary | ICD-10-CM

## 2024-01-13 DIAGNOSIS — F1090 Alcohol use, unspecified, uncomplicated: Secondary | ICD-10-CM | POA: Diagnosis not present

## 2024-01-13 NOTE — Progress Notes (Unsigned)
 Primary Care Physician:  Alphonsa Glendia LABOR, MD Primary Gastroenterologist:  Dr.   Pre-Procedure History & Physical: HPI:  Beth Hamilton is a 68 y.o. female here for follow-up of longstanding left-sided proctocolitis.  Now on maintenance written Volk.  She is doing wonderful.  Bowel function is normal sense of wellbeing is normal.  Feels like her old self.  Note a relative but not absolute increase in ALT 45 on most recent assay.  Started Mounjaro .  She is lost 13 pounds since I last saw her.  She remains significantly overweight.  Also 1 to 4 glasses of red wine daily.  Also on low-dose Crestor .  Fecal calprotectin plummeted from 1000-1 06. Past Medical History:  Diagnosis Date   Breast cancer (HCC) 04/20/14   left breast   Complication of anesthesia    not sure what med was given in 96 with D&C, was very sore all over. Dr Tamea did anesthesia. Dr Harriette did surgery   Gout    Hemorrhoids    History of breast cancer 05/19/2015   Hypertension    Hypothyroidism    IBD (inflammatory bowel disease)    [facility]   Personal history of radiation therapy    2015   Pneumonia    Psoriasis    Thyroid  disease    Ulcerative colitis (HCC) 5 1 2007   Dr Shaaron    Past Surgical History:  Procedure Laterality Date   BIOPSY  08/02/2019   Procedure: BIOPSY;  Surgeon: Shaaron Lamar HERO, MD;  [facility];  Service: Endoscopy;;  colon   BIOPSY  03/10/2023   Procedure: BIOPSY;  Surgeon: Shaaron Lamar HERO, MD;  [facility];  Service: Endoscopy;;   BREAST BIOPSY     BREAST BIOPSY Left 06/13/2023   MM LT BREAST BX W LOC DEV 1ST LESION IMAGE BX SPEC STEREO GUIDE 06/13/2023 [facility]   BREAST BIOPSY Left 12/22/2023   MM LT BREAST BX W LOC DEV 1ST LESION IMAGE BX SPEC STEREO GUIDE 12/22/2023 [facility]   BREAST LUMPECTOMY Left 04/20/2014   COLONOSCOPY  10/09/2001   RMR: Internal hemorrhoids and anal papilla; otherwise normal rectum   COLONOSCOPY  12/10/2005   incomplete  tcs-  colitis   COLONOSCOPY N/A 04/18/2014   Dr.Jarquis Walker- abnormal rectum & L colon into the mid descending segment. mucosa was erythematious as well as pale w/ some loss of normal vascular apttern. no erosions or ulcers. pt had some smooth peduculated 3-4cm polyps in the sigmoid segment most c/w pseudopolyps. the rest of the colonic mucosa appeared normal. bx= inflammatory polyps   COLONOSCOPY WITH PROPOFOL  N/A 08/02/2019   Procedure: COLONOSCOPY WITH PROPOFOL ;  Surgeon: Shaaron Lamar HERO, MD;  [facility];  Service: Endoscopy;  Laterality: N/A;  8:30am   COLONOSCOPY WITH PROPOFOL  N/A 03/10/2023   Procedure: COLONOSCOPY WITH PROPOFOL ;  Surgeon: Shaaron Lamar HERO, MD;  [facility];  Service: Endoscopy;  Laterality: N/A;  7:30 am, asa 2   DILATION AND CURETTAGE OF UTERUS  1996   Dr Harriette   FLEXIBLE SIGMOIDOSCOPY  02/26/2006   endoscopically normal-appearing rectum, colitis in sigmoid mucosa   PARTIAL MASTECTOMY WITH NEEDLE LOCALIZATION AND AXILLARY SENTINEL LYMPH NODE BX Left 04/20/2014   Procedure: PARTIAL MASTECTOMY AFTER NEEDLE LOCALIZATION AND AXILLARY SENTINEL LYMPH NODE BX;  Surgeon: Oneil LABOR Budge, MD;  Location: [facility];  Service: General;  Laterality: Left;   POLYPECTOMY  03/10/2023   Procedure: POLYPECTOMY;  Surgeon: Shaaron Lamar HERO, MD;  [facility];  Service: Endoscopy;;   TUBAL LIGATION     WISDOM TOOTH EXTRACTION      Prior to Admission medications   Medication Sig Start Date End Date Taking? Authorizing Provider  acyclovir  ointment (ZOVIRAX ) 5 % Apply 1 Application topically every 4 (four) hours as needed. 09/10/22  Yes Luking, Glendia LABOR, MD  alendronate  (FOSAMAX ) 70 MG tablet TAKE 1 TABLET (70 MG TOTAL) BY MOUTH ONCE A WEEK. TAKE WITH A FULL GLASS OF WATER  ON AN EMPTY STOMACH. 06/18/23  Yes Rogers Hai, MD  allopurinol  (ZYLOPRIM ) 300 MG tablet TAKE 1 TABLET BY MOUTH EVERY DAY 10/28/23  Yes Luking, Glendia LABOR, MD  amLODipine  (NORVASC ) 5 MG tablet TAKE 1  TABLET (5 MG TOTAL) BY MOUTH DAILY. 10/16/23  Yes Luking, Glendia LABOR, MD  anastrozole  (ARIMIDEX ) 1 MG tablet TAKE 1 TABLET BY MOUTH EVERY DAY 07/28/23  Yes Rogers Hai, MD  ARMOUR THYROID  60 MG tablet Take 60 mg by mouth daily. 1/2 pill on Sat and Sun. 11/16/18  Yes [provider]  B Complex Vitamins (VITAMIN-B COMPLEX PO) Take 1 mL by mouth daily. Vit b12   Yes [provider]  Calcium  Citrate-Vitamin D  (CITRACAL + D PO) Take by mouth.   Yes [provider]  folic acid  (FOLVITE ) 400 MCG tablet Take 400 mcg by mouth daily.    Yes [provider]  lisinopril -hydrochlorothiazide  (ZESTORETIC ) 20-25 MG tablet TAKE 1 TABLET BY MOUTH EVERY DAY 10/28/23  Yes Luking, Scott A, MD  metFORMIN  (GLUCOPHAGE ) 500 MG tablet TAKE 1 TABLET BY MOUTH EVERY DAY 07/28/23  Yes Luking, Scott A, MD  rosuvastatin  (CRESTOR ) 10 MG tablet TAKE 1 TABLET BY MOUTH EVERY DAY 07/28/23  Yes Luking, Scott A, MD  tirzepatide  (MOUNJARO ) 7.5 MG/0.5ML Pen Inject 7.5 mg into the skin once a week. 11/12/23  Yes Alphonsa Glendia LABOR, MD  Upadacitinib  ER (RINVOQ ) 15 MG TB24 Take 1 tablet (15 mg total) by mouth daily. 06/23/23  Yes RourkLamar HERO, MD    Allergies as of 01/13/2024 - Review Complete 01/13/2024  Allergen Reaction Noted   Flagyl [metronidazole]  10/29/2013   Other Other (See Comments) 03/22/2014   Levaquin [levofloxacin in d5w]  07/23/2013    Family History  Problem Relation Age of Onset   Stroke Maternal Grandfather    Heart attack Maternal Grandfather    Hypertension Mother    Diabetes Paternal Grandfather    Heart disease Paternal Grandfather    Colon cancer Neg Hx     Social History   Socioeconomic History   Marital status: Legally Separated    Spouse name: Not on file   Number of children: 1   Years of education: Not on file   Highest education level: Not on file  Occupational History   Not on file  Tobacco Use   Smoking status: Former    Current packs/day: 0.00    Types:  Cigarettes    Quit date: 07/26/1990    Years since quitting: 33.4   Smokeless tobacco: Never   Tobacco comments:    quit in 2007  Vaping Use   Vaping status: Never Used  Substance and Sexual Activity   Alcohol use: Yes    Alcohol/week: 14.0 standard drinks of alcohol    Types: 14 Glasses of wine per week    Comment: wine every day   Drug use: No   Sexual activity: Not Currently    Birth control/protection: Post-menopausal, Abstinence, Surgical    Comment: tubal  Other Topics Concern   Not on file  Social History Narrative   Not on file   Social Drivers of Health   Financial Resource Strain: Low Risk  (05/21/2023)   Overall Financial Resource Strain (CARDIA)    Difficulty of Paying Living Expenses: Not hard at all  Food Insecurity: No Food Insecurity (05/21/2023)   Hunger Vital Sign    Worried About Running Out of Food in the Last Year: Never true    Ran Out of Food in the Last Year: Never true  Transportation Needs: No Transportation Needs (05/21/2023)   PRAPARE - Administrator, Civil Service (Medical): No    Lack of Transportation (Non-Medical): No  Physical Activity: Insufficiently Active (05/21/2023)   Exercise Vital Sign    Days of Exercise per Week: 3 days    Minutes of Exercise per Session: 20 min  Stress: No Stress Concern Present (05/21/2023)   [employer] of Occupational Health - Occupational Stress Questionnaire    Feeling of Stress : Not at all  Social Connections: Moderately Isolated (05/21/2023)   Social Connection and Isolation Panel    Frequency of Communication with Friends and Family: Three times a week    Frequency of Social Gatherings with Friends and Family: More than three times a week    Attends Religious Services: More than 4 times per year    Active Member of [employer] or Organizations: No    Attends Banker Meetings: Never    Marital Status: Separated  Intimate Partner Violence: Not At Risk (05/21/2023)    Humiliation, Afraid, Rape, and Kick questionnaire    Fear of Current or Ex-Partner: No    Emotionally Abused: No    Physically Abused: No    Sexually Abused: No    Review of Systems: See HPI, otherwise negative ROS  Physical Exam: BP 126/76 (BP Location: Right Arm, Patient Position: Sitting, Cuff Size: Normal)   Pulse (!) 101   Temp 98 F (36.7 C) (Oral)   Ht 5' 4 (1.626 m)   Wt 169 lb 3.2 oz (76.7 kg)   SpO2 98%   BMI 29.04 kg/m  General:   Alert,  Well-developed, well-nourished, pleasant and cooperative in NAD Lungs:  Clear throughout to auscultation.   No wheezes, crackles, or rhonchi. No acute distress. Heart:  Regular rate and rhythm; no murmurs, clicks, rubs,  or gallops. Abdomen: Non-distended, normal bowel sounds.  Soft and nontender without appreciable mass or hepatosplenomegaly.   Impression/Plan: Left-sided proctocolitis.  Excellent response to Rinvoq .  Bump in ALT noted.  Cannot rule out side effect of Rinvoq .  Also takes pravastatin and does consume alcohol in the form of wine on a regular basis.  She was congratulated on weight loss I think a GLP modulator was an excellent addition to her regimen.   Mild elevation in ALT could be due to drug effect, alcohol and an element of metabolic of fatty liver disease.  She is doing so well on Rinvoq  at this time  Recommendations:   For now, no alcohol  Continued weight loss regular exercise  Continue Rinvoq   Repeat LFTs in 8 weeks.   Notice: This dictation was prepared with Dragon dictation along with smaller phrase technology. Any transcriptional errors that result from this process are unintentional and may not be corrected upon review.

## 2024-01-13 NOTE — Patient Instructions (Addendum)
 It was great seeing you again today!  I am so glad you are doing well on Rinvoq   You may have some fatty liver contributing to your mild increase in LFTs.  Alcohol and statin therapy may be contributing.  Cannot exclude Rinvoq  as a contributing factor  For now, no alcohol  Continued weight loss regular exercise  Continue Rinvoq   Repeat LFTs in 8 weeks.

## 2024-01-14 ENCOUNTER — Ambulatory Visit: Payer: BC Managed Care – PPO | Admitting: Family Medicine

## 2024-01-14 ENCOUNTER — Encounter: Payer: Self-pay | Admitting: Family Medicine

## 2024-01-14 VITALS — BP 121/80 | Temp 97.7°F | Ht 64.0 in | Wt 168.0 lb

## 2024-01-14 DIAGNOSIS — K5190 Ulcerative colitis, unspecified, without complications: Secondary | ICD-10-CM

## 2024-01-14 DIAGNOSIS — I10 Essential (primary) hypertension: Secondary | ICD-10-CM

## 2024-01-14 DIAGNOSIS — E1169 Type 2 diabetes mellitus with other specified complication: Secondary | ICD-10-CM | POA: Diagnosis not present

## 2024-01-14 DIAGNOSIS — Z7984 Long term (current) use of oral hypoglycemic drugs: Secondary | ICD-10-CM

## 2024-01-14 DIAGNOSIS — E785 Hyperlipidemia, unspecified: Secondary | ICD-10-CM

## 2024-01-14 DIAGNOSIS — E119 Type 2 diabetes mellitus without complications: Secondary | ICD-10-CM

## 2024-01-14 DIAGNOSIS — M10079 Idiopathic gout, unspecified ankle and foot: Secondary | ICD-10-CM

## 2024-01-14 DIAGNOSIS — R7401 Elevation of levels of liver transaminase levels: Secondary | ICD-10-CM

## 2024-01-14 MED ORDER — ALLOPURINOL 300 MG PO TABS: 300.0000 mg | ORAL_TABLET | Freq: Every day | ORAL | 1 refills | Status: DC

## 2024-01-14 MED ORDER — LISINOPRIL-HYDROCHLOROTHIAZIDE 20-12.5 MG PO TABS: 1.0000 | ORAL_TABLET | Freq: Every day | ORAL | 1 refills | Status: DC

## 2024-01-14 MED ORDER — METFORMIN HCL 500 MG PO TABS: 500.0000 mg | ORAL_TABLET | Freq: Every day | ORAL | 1 refills | Status: DC

## 2024-01-14 MED ORDER — ROSUVASTATIN CALCIUM 10 MG PO TABS: 10.0000 mg | ORAL_TABLET | Freq: Every day | ORAL | 1 refills | Status: DC

## 2024-01-14 MED ORDER — TIRZEPATIDE 7.5 MG/0.5ML ~~LOC~~ SOAJ: 7.5000 mg | SUBCUTANEOUS | 5 refills | Status: DC

## 2024-01-14 NOTE — Progress Notes (Signed)
 Subjective:    Patient ID: Beth Hamilton, female    DOB: [birth year], 68 y.o.   MRN: [id number]  HPI  4 month follow up- patient would like to discuss bp medications- to wean off some, her blood pressures have been running good Recently patient had liver enzyme elevated.  There was some concern that there could be underlying side effects from her ulcerative colitis medication but her specialist is following this up in 8 weeks Her recent lab work shows good results with the A1c She has done well with diet and active tach activity and medications at losing weight she denies feeling drowsy or dizzy She is tolerating the metformin  and Mounjaro  well Results for orders placed or performed during the hospital encounter of 12/22/23  Surgical pathology   Collection Time: 12/22/23 12:00 AM  Result Value Ref Range   SURGICAL PATHOLOGY      SURGICAL PATHOLOGY [facility] [address] [place], [place] [address] Telephone [phone] or [phone] Fax [phone]  REPORT OF SURGICAL PATHOLOGY   Accession #: [id number] Patient Name: NAIOMY, WATTERS Visit # : [id number]  MRN: [id number] Physician: Avanell Lenis DOB/Age [birth year] (Age: 23) Gender: F Collected Date: 12/22/2023 Received Date: 12/22/2023  FINAL DIAGNOSIS       1. Breast, left, needle core biopsy, lateral near 3 o'clock, coil clip :       - FIBROCYSTIC CHANGE WITH DENSE STROMAL FIBROSIS, FOCAL FAT NECROSIS AND RARE      CALCIFICATIONS, SEE NOTE      - NEGATIVE FOR CARCINOMA       Diagnosis Note : [facility] was notified on      12/23/2023.      DATE SIGNED OUT: 12/23/2023 ELECTRONIC SIGNATURE : Rebbecca Md, Nilesh, Pathologist, Electronic Signature  MICROSCOPIC DESCRIPTION  CASE COMMENTS STAINS USED IN DIAGNOSIS: H&E-2 H&E-3 H&E-4 H&E H&E-2 H&E-3 H&E-4 H&E     CLINICAL HISTORY  SPECIMEN(S) OBTAINED 1. Breast, left, needle core biopsy,  Lateral Near 3 O'clock, Coil Clip  SPECIMEN COMMENTS: 1. TIF: 8:40 AM, CIT 2 minutes; history of left lumpectomy for DCIS in 2015; left stereotactic biopsy of this same distortion in 05/2023 with benign results, area increased in density SPECIMEN CLINICAL INFORMATION: 1. Benign vs carcinoma    Gross Description 1. Received in formalin in a jar labelled lateral left distortion are multiple fragments of tan-yellow, fibrofatty tissue aggregating to 3.0 x 1.3 x 0.3 cm submitted entirely in 2 blocks.      CIT: 2 minutes      TIF: 0840      mb 12/22/2023        Report signed out from the following location(s) [place] [address] [place], [place] [address] CLIA #: [id number]  [facility] [address] [place], [place] [address] CLIA #: [id number]    Labs reviewed in detail Recent breast biopsy discussed Review of Systems     Objective:   Physical Exam  General-in no acute distress Eyes-no discharge Lungs-respiratory rate normal, CTA CV-no murmurs,RRR Extremities skin warm dry no edema Neuro grossly normal Behavior normal, alert       Assessment & Plan:  1. HTN (hypertension), benign (Primary) Blood pressure very good Dropped considerably with standing Therefore stop the amlodipine  Decrease lisinopril /HCTZ new dose to 20 mg/12.51 daily  2. Diabetes mellitus without complication (HCC) A1c under good control tolerating metformin  1 a day tolerating Mounjaro  has seen very  good results  3. Hyperlipidemia associated with type 2 diabetes mellitus (HCC) Goal is to get LDL below 70 continue statins healthy diet  4. Ulcerative colitis without complications, unspecified location [facility]) Follow through with specialist they are monitoring liver enzyme for now we will continue statin  5. Idiopathic gout involving toe, unspecified chronicity, unspecified laterality Continue allopurinol  healthy diet  6. Elevated transaminase  level Specialist Will do follow-up 8 weeks  General Follow-up 6 months

## 2024-03-09 ENCOUNTER — Encounter: Payer: Self-pay | Admitting: Internal Medicine

## 2024-05-19 ENCOUNTER — Inpatient Hospital Stay: Payer: BC Managed Care – PPO | Attending: Oncology

## 2024-05-19 ENCOUNTER — Ambulatory Visit (HOSPITAL_COMMUNITY)
Admission: RE | Admit: 2024-05-19 | Discharge: 2024-05-19 | Disposition: A | Discharge status: Home / Self Care | Source: Ambulatory Visit | Attending: Hematology | Admitting: Hematology

## 2024-05-19 ENCOUNTER — Ambulatory Visit (HOSPITAL_COMMUNITY)
Admission: RE | Admit: 2024-05-19 | Discharge: 2024-05-19 | Disposition: A | Discharge status: Home / Self Care | Payer: BC Managed Care – PPO | Source: Ambulatory Visit | Attending: Hematology | Admitting: Hematology

## 2024-05-19 DIAGNOSIS — Z780 Asymptomatic menopausal state: Secondary | ICD-10-CM | POA: Diagnosis present

## 2024-05-19 DIAGNOSIS — C50512 Malignant neoplasm of lower-outer quadrant of left female breast: Secondary | ICD-10-CM | POA: Insufficient documentation

## 2024-05-19 DIAGNOSIS — Z1231 Encounter for screening mammogram for malignant neoplasm of breast: Secondary | ICD-10-CM | POA: Insufficient documentation

## 2024-05-19 DIAGNOSIS — Z79899 Other long term (current) drug therapy: Secondary | ICD-10-CM | POA: Diagnosis present

## 2024-05-19 DIAGNOSIS — M810 Age-related osteoporosis without current pathological fracture: Secondary | ICD-10-CM | POA: Diagnosis not present

## 2024-05-19 DIAGNOSIS — Z170 Estrogen receptor positive status [ER+]: Secondary | ICD-10-CM | POA: Diagnosis present

## 2024-05-19 LAB — CBC WITH DIFFERENTIAL/PLATELET
Abs Immature Granulocytes: 0.03 K/uL (ref 0.00–0.07)
Basophils Absolute: 0 K/uL (ref 0.0–0.1)
Basophils Relative: 0 %
Eosinophils Absolute: 0 K/uL (ref 0.0–0.5)
Eosinophils Relative: 0 %
HCT: 39 % (ref 36.0–46.0)
Hemoglobin: 12.9 g/dL (ref 12.0–15.0)
Immature Granulocytes: 0 %
Lymphocytes Relative: 20 %
Lymphs Abs: 1.8 K/uL (ref 0.7–4.0)
MCH: 35 pg — ABNORMAL HIGH (ref 26.0–34.0)
MCHC: 33.1 g/dL (ref 30.0–36.0)
MCV: 105.7 fL — ABNORMAL HIGH (ref 80.0–100.0)
Monocytes Absolute: 0.9 K/uL (ref 0.1–1.0)
Monocytes Relative: 10 %
Neutro Abs: 6.3 K/uL (ref 1.7–7.7)
Neutrophils Relative %: 70 %
Platelets: 373 K/uL (ref 150–400)
RBC: 3.69 MIL/uL — ABNORMAL LOW (ref 3.87–5.11)
RDW: 14.6 % (ref 11.5–15.5)
WBC: 9 K/uL (ref 4.0–10.5)
nRBC: 0 % (ref 0.0–0.2)

## 2024-05-19 LAB — COMPREHENSIVE METABOLIC PANEL WITH GFR
ALT: 30 U/L (ref 0–44)
AST: 31 U/L (ref 15–41)
Albumin: 4.7 g/dL (ref 3.5–5.0)
Alkaline Phosphatase: 47 U/L (ref 38–126)
Anion gap: 11 (ref 5–15)
BUN: 16 mg/dL (ref 8–23)
CO2: 26 mmol/L (ref 22–32)
Calcium: 9.5 mg/dL (ref 8.9–10.3)
Chloride: 102 mmol/L (ref 98–111)
Creatinine, Ser: 0.79 mg/dL (ref 0.44–1.00)
GFR, Estimated: >60 mL/min (ref >60)
Glucose, Bld: 90 mg/dL (ref 70–99)
Potassium: 4.1 mmol/L (ref 3.5–5.1)
Sodium: 139 mmol/L (ref 135–145)
Total Bilirubin: 1.1 mg/dL (ref 0.0–1.2)
Total Protein: 7.5 g/dL (ref 6.5–8.1)

## 2024-05-19 LAB — VITAMIN D 25 HYDROXY (VIT D DEFICIENCY, FRACTURES): Vit D, 25-Hydroxy: 42.17 ng/mL (ref 30–100)

## 2024-05-20 ENCOUNTER — Other Ambulatory Visit: Payer: BC Managed Care – PPO

## 2024-05-21 ENCOUNTER — Other Ambulatory Visit (HOSPITAL_COMMUNITY)
Admission: RE | Admit: 2024-05-21 | Discharge: 2024-05-21 | Disposition: A | Discharge status: Home / Self Care | Source: Ambulatory Visit | Attending: Adult Health | Admitting: Adult Health

## 2024-05-21 ENCOUNTER — Encounter: Payer: Self-pay | Admitting: Adult Health

## 2024-05-21 ENCOUNTER — Ambulatory Visit (INDEPENDENT_AMBULATORY_CARE_PROVIDER_SITE_OTHER): Admitting: Adult Health

## 2024-05-21 VITALS — BP 144/91 | HR 84 | Ht 64.0 in | Wt 155.0 lb

## 2024-05-21 DIAGNOSIS — Z1151 Encounter for screening for human papillomavirus (HPV): Secondary | ICD-10-CM

## 2024-05-21 DIAGNOSIS — Z01419 Encounter for gynecological examination (general) (routine) without abnormal findings: Secondary | ICD-10-CM | POA: Insufficient documentation

## 2024-05-21 DIAGNOSIS — Z1331 Encounter for screening for depression: Secondary | ICD-10-CM | POA: Diagnosis not present

## 2024-05-21 DIAGNOSIS — Z853 Personal history of malignant neoplasm of breast: Secondary | ICD-10-CM

## 2024-05-21 DIAGNOSIS — I10 Essential (primary) hypertension: Secondary | ICD-10-CM

## 2024-05-21 NOTE — Progress Notes (Signed)
 Patient ID: Beth Hamilton, female   DOB: 16-Jul-1956, 68 y.o.   MRN: [id number] History of Present Illness: Beth Hamilton is a 68 year old white female, separated, PM in for a well woman gyn exam and pap. She has lost about 50 lbs in last 3 years  PCP is Dr Alphonsa  Current Medications, Allergies, Past Medical History, Past Surgical History, Family History and Social History were reviewed in [employer] electronic medical record.     Review of Systems: Patient denies any headaches, hearing loss, fatigue, blurred vision, shortness of breath, chest pain, abdominal pain, problems with bowel movements, urination, or intercourse(not active). No joint pain or mood swings.  Denies any vaginal bleeding   Physical Exam:BP (!) 144/91 (BP Location: Right Arm, Patient Position: Sitting, Cuff Size: Normal)   Pulse 84   Ht 5' 4 (1.626 m)   Wt 155 lb (70.3 kg)   BMI 26.61 kg/m   General:  Well developed, well nourished, no acute distress Skin:  Warm and dry Neck:  Midline trachea, normal thyroid , good ROM, no lymphadenopathy, no carotid bruits heard Lungs; Clear to auscultation bilaterally Breast:  No dominant palpable mass, retraction, or nipple discharge Cardiovascular: Regular rate and rhythm Abdomen:  Soft, non tender, no hepatosplenomegaly Pelvic:  External genitalia is normal in appearance, no lesions.  The vagina is pale and atrophic. Urethra has no lesions or masses. The cervix is smooth, pap with HR HPV genotyping performed.  Uterus is felt to be normal size, shape, and contour.  No adnexal masses or tenderness noted.Bladder is non tender, no masses felt. Rectal: Deferred  Extremities/musculoskeletal:  No swelling or varicosities noted, no clubbing or cyanosis Psych:  No mood changes, alert and cooperative,seems happy AA is 4 Fall risk is low    05/21/2024    9:52 AM 01/14/2024    8:28 AM 11/12/2023   10:53 AM  Depression screen PHQ 2/9  Decreased Interest 0 0 0  Down, Depressed, Hopeless  0 0 0  PHQ - 2 Score 0 0 0  Altered sleeping 0 0 0  Tired, decreased energy 0 0 0  Change in appetite 0 0 0  Feeling bad or failure about yourself  0 0 0  Trouble concentrating 0 0 0  Moving slowly or fidgety/restless 0 0 0  Suicidal thoughts 0 0 0  PHQ-9 Score 0 0 0  Difficult doing work/chores  Not difficult at all        05/21/2024    9:52 AM 01/14/2024    8:28 AM 11/12/2023   10:54 AM 09/11/2023   10:25 AM  GAD 7 : Generalized Anxiety Score  Nervous, Anxious, on Edge 0 0 0 0  Control/stop worrying 0 0 0 0  Worry too much - different things 0 0 0 0  Trouble relaxing 0 0 0 0  Restless 0 0 0 0  Easily annoyed or irritable 0 0 0 0  Afraid - awful might happen 0 0 0 0  Total GAD 7 Score 0 0 0 0  Anxiety Difficulty  Not difficult at all  Not difficult at all    Upstream - 05/21/24 0947       Pregnancy Intention Screening   Does the patient want to become pregnant in the next year? N/A    Does the patient's partner want to become pregnant in the next year? N/A    Would the patient like to discuss contraceptive options today? N/A      Contraception Wrap Up  Current Method Abstinence;Female Sterilization   PM   End Method Abstinence;Female Sterilization   PM   Contraception Counseling Provided No         Examination chaperoned by Clarita Salt LPN    Impression and plan: 1. Encounter for gynecological examination with Papanicolaou smear of cervix (Primary) Pap sent Pap in 3 years if negative  Physical in 1 year Labs with PCP Had mammogram 05/19/24, not resulted yet - Cytology - PAP( [place]) Colonoscopy per GI  2. History of breast cancer Still on Arimidex   3. Hypertension, unspecified type Continue meds and follow up with PCP

## 2024-05-25 LAB — CYTOLOGY - PAP
Comment: NEGATIVE
Diagnosis: NEGATIVE
High risk HPV: NEGATIVE

## 2024-05-26 ENCOUNTER — Ambulatory Visit: Payer: Self-pay | Admitting: Adult Health

## 2024-05-26 NOTE — Telephone Encounter (Signed)
-----   Message from Delon Lewis sent at 05/26/2024  8:15 AM EST ----- Let [facility] know about pap. THX

## 2024-05-26 NOTE — Telephone Encounter (Signed)
 Pt aware of normal pap and mammogram. Pt voiced understanding. JSY

## 2024-05-27 ENCOUNTER — Inpatient Hospital Stay: Payer: BC Managed Care – PPO | Attending: Hematology | Admitting: Oncology

## 2024-05-27 DIAGNOSIS — C50512 Malignant neoplasm of lower-outer quadrant of left female breast: Secondary | ICD-10-CM | POA: Insufficient documentation

## 2024-05-27 DIAGNOSIS — Z923 Personal history of irradiation: Secondary | ICD-10-CM | POA: Insufficient documentation

## 2024-05-27 DIAGNOSIS — F1090 Alcohol use, unspecified, uncomplicated: Secondary | ICD-10-CM | POA: Insufficient documentation

## 2024-05-27 DIAGNOSIS — M810 Age-related osteoporosis without current pathological fracture: Secondary | ICD-10-CM | POA: Insufficient documentation

## 2024-05-27 DIAGNOSIS — Z1231 Encounter for screening mammogram for malignant neoplasm of breast: Secondary | ICD-10-CM | POA: Diagnosis not present

## 2024-05-27 DIAGNOSIS — Z87891 Personal history of nicotine dependence: Secondary | ICD-10-CM | POA: Insufficient documentation

## 2024-05-27 DIAGNOSIS — Z79811 Long term (current) use of aromatase inhibitors: Secondary | ICD-10-CM | POA: Diagnosis not present

## 2024-05-27 NOTE — Progress Notes (Signed)
 [facility] [address], [place] [address]    Clinic Day:  05/27/2024  Referring physician: Alphonsa Glendia LABOR, MD  Patient Care Team: Alphonsa Glendia LABOR, MD as PCP - General (Family Medicine) Shaaron Lamar HERO, MD (Gastroenterology) Mavis Anes, MD as Consulting Physician (General Surgery) Jayne Vonn DEL, MD as Consulting Physician (Obstetrics and Gynecology)   ASSESSMENT & PLAN:   Assessment: 1.  Stage I (T1BN0) left breast cancer: -Status post left lumpectomy on 04/20/2014, 0.9 cm IDC, grade 1, ER/PR positive, HER-2 negative, Ki-67 of 10%. -Oncotype DX score of 7, underwent radiation therapy, started on anastrozole . -Patient opted to continue anastrozole  beyond 5 years.   2.  Osteoporosis: - DEXA scan in 2015 showed osteoporosis. - Prolia  from 12/30/2014 through 01/11/2021, self discontinued due to high cost. -DEXA scan in December 2017 showed T score of -2.4. -Repeat DEXA scan on 04/27/2019 showed T score of -2.1. - DEXA scan on 05/09/2022 with T score -3.0 - Fosamax  70 mg weekly started on 05/16/2022  Plan: 1.  Stage I (T1BN0) left breast cancer: - She is tolerating anastrozole  very well.  She will complete 10 years of treatment at the end of the year.  We discussed stopping on December 31. - Physical exam: Left breast upper inner quadrant scar is stable.  No palpable mass in bilateral breast.  No palpable adenopathy. - Labs from 05/19/2024 show unremarkable CMP.  CBC shows elevated MCV but normal hemoglobin.  White blood cell count WNL. -Mammogram from 05/19/2024 was BI-RADS Category 1 negative. -Discussed BCI testing to inform her of her risk of recurrence after 10 years of antihormone medication.  Sent this to [place] to see if she was eligible. - Continue anastrozole  daily until end of 2025.  RTC 1 year for follow-up.   2.  Osteoporosis: - She started Fosamax  70 mg weekly last year.  Vitamin D  level is 42.17. - She is tolerating it well.  Will repeat  DEXA scan prior to next visit in 1 year.   Breast Cancer therapy associated bone loss: I have recommended calcium , Vitamin D  and weight bearing exercises.  Orders Placed This Encounter  Procedures   MM 3D SCREENING MAMMOGRAM BILATERAL BREAST    Standing Status:   Future    Expected Date:   05/27/2025    Expiration Date:   08/25/2025    Reason for Exam (SYMPTOM  OR DIAGNOSIS REQUIRED):   screening    Preferred imaging location?:   [facility]     Delon FORBES Hope, NP   11/6/20259:29 AM  CHIEF COMPLAINT:   Diagnosis: Malignant neoplasm of lower-outer quadrant of left breast of female    Cancer Staging  Breast cancer of lower-outer quadrant of left female breast [facility]) Staging form: Breast, AJCC 7th Edition - Clinical: No stage assigned - Unsigned    Prior Therapy: Radiation Therapy from 06/22/2014 - 08/05/2014: Left breast 50 Gy at 2 Gy per fraction x 25 fractions with left breast boost 10 Gy at 2 Gy per fraction x 5 fractions by Dr. Keenan   Current Therapy:  Anastrozole    HISTORY OF PRESENT ILLNESS:   Oncology History  Breast cancer of lower-outer quadrant of left female breast (HCC)  05/24/2014 Initial Diagnosis   Breast cancer of lower-outer quadrant of left female breast   06/22/2014 - 08/05/2014 Radiation Therapy   Left breast 50 Gy at 2 Gy per fraction x 25 fractions with left breast boost 10 Gy at 2 Gy per fraction  x 5 fractions by Dr. Keenan.      INTERVAL HISTORY:   Beth Hamilton is a 68 y.o. female presenting to clinic today for follow up of left breast cancer.   Since her last visit, she had a screening mammogram bilaterally on 05/19/2024 which was read as BI-RADS Category 1 negative.  Repeat in 1 year.  Today, she states that she is doing well overall. Her appetite level is at 100%. Her energy level is at 100%.   She is tolerating Anastrozole  well and denies any side effects including, hot flashes and new aches or pains. She states she does not have any  new symptoms since her last visit. She started fosamax  1 year ago and denies any side effects.  She is nervous to come off anastrozole .  She is wanting to know her risk of recurrence.  She never had BCI testing completed.  She denies any pain.  Denies any lymphedema.  PAST MEDICAL HISTORY:   Past Medical History: Past Medical History:  Diagnosis Date   Breast cancer (HCC) 04/20/14   left breast   Complication of anesthesia    not sure what med was given in 96 with D&C, was very sore all over. Dr Tamea did anesthesia. Dr Harriette did surgery   Gout    Hemorrhoids    History of breast cancer 05/19/2015   Hypertension    Hypothyroidism    IBD (inflammatory bowel disease)    [facility]   Personal history of radiation therapy    2015   Pneumonia    Psoriasis    Thyroid  disease    Ulcerative colitis (HCC) 5 1 2007   Dr Shaaron    Surgical History: Past Surgical History:  Procedure Laterality Date   BIOPSY  08/02/2019   Procedure: BIOPSY;  Surgeon: Shaaron Lamar HERO, MD;  [facility];  Service: Endoscopy;;  colon   BIOPSY  03/10/2023   Procedure: BIOPSY;  Surgeon: Shaaron Lamar HERO, MD;  [facility];  Service: Endoscopy;;   BREAST BIOPSY     BREAST BIOPSY Left 06/13/2023   MM LT BREAST BX W LOC DEV 1ST LESION IMAGE BX SPEC STEREO GUIDE 06/13/2023 [facility]   BREAST BIOPSY Left 12/22/2023   MM LT BREAST BX W LOC DEV 1ST LESION IMAGE BX SPEC STEREO GUIDE 12/22/2023 [facility]   BREAST LUMPECTOMY Left 04/20/2014   COLONOSCOPY  10/09/2001   RMR: Internal hemorrhoids and anal papilla; otherwise normal rectum   COLONOSCOPY  12/10/2005   incomplete tcs-  colitis   COLONOSCOPY N/A 04/18/2014   Dr.Rourk- abnormal rectum & L colon into the mid descending segment. mucosa was erythematious as well as pale w/ some loss of normal vascular apttern. no erosions or ulcers. pt had some smooth peduculated 3-4cm polyps in the sigmoid segment most c/w pseudopolyps. the rest of  the colonic mucosa appeared normal. bx= inflammatory polyps   COLONOSCOPY WITH PROPOFOL  N/A 08/02/2019   Procedure: COLONOSCOPY WITH PROPOFOL ;  Surgeon: Shaaron Lamar HERO, MD;  [facility];  Service: Endoscopy;  Laterality: N/A;  8:30am   COLONOSCOPY WITH PROPOFOL  N/A 03/10/2023   Procedure: COLONOSCOPY WITH PROPOFOL ;  Surgeon: Shaaron Lamar HERO, MD;  [facility];  Service: Endoscopy;  Laterality: N/A;  7:30 am, asa 2   DILATION AND CURETTAGE OF UTERUS  1996   Dr Harriette   FLEXIBLE SIGMOIDOSCOPY  02/26/2006   endoscopically normal-appearing rectum, colitis in sigmoid mucosa   PARTIAL MASTECTOMY WITH NEEDLE LOCALIZATION AND AXILLARY  SENTINEL LYMPH NODE BX Left 04/20/2014   Procedure: PARTIAL MASTECTOMY AFTER NEEDLE LOCALIZATION AND AXILLARY SENTINEL LYMPH NODE BX;  Surgeon: Oneil DELENA Budge, MD;  Location: [facility];  Service: General;  Laterality: Left;   POLYPECTOMY  03/10/2023   Procedure: POLYPECTOMY;  Surgeon: Shaaron Lamar HERO, MD;  [facility];  Service: Endoscopy;;   TUBAL LIGATION     WISDOM TOOTH EXTRACTION      Social History: Social History   Socioeconomic History   Marital status: Legally Separated    Spouse name: Not on file   Number of children: 1   Years of education: Not on file   Highest education level: Not on file  Occupational History   Not on file  Tobacco Use   Smoking status: Former    Current packs/day: 0.00    Types: Cigarettes    Quit date: 07/26/1990    Years since quitting: 33.8   Smokeless tobacco: Never   Tobacco comments:    quit in 2007  Vaping Use   Vaping status: Never Used  Substance and Sexual Activity   Alcohol use: Yes    Alcohol/week: 14.0 standard drinks of alcohol    Types: 14 Glasses of wine per week    Comment: wine every day   Drug use: No   Sexual activity: Not Currently    Birth control/protection: Post-menopausal, Abstinence, Surgical    Comment: tubal  Other Topics Concern   Not on file  Social History  Narrative   Not on file   Social Drivers of Health   Financial Resource Strain: Low Risk  (05/21/2024)   Overall Financial Resource Strain (CARDIA)    Difficulty of Paying Living Expenses: Not hard at all  Food Insecurity: No Food Insecurity (05/21/2024)   Hunger Vital Sign    Worried About Running Out of Food in the Last Year: Never true    Ran Out of Food in the Last Year: Never true  Transportation Needs: No Transportation Needs (05/21/2024)   PRAPARE - Administrator, Civil Service (Medical): No    Lack of Transportation (Non-Medical): No  Physical Activity: Insufficiently Active (05/21/2024)   Exercise Vital Sign    Days of Exercise per Week: 2 days    Minutes of Exercise per Session: 20 min  Stress: No Stress Concern Present (05/21/2024)   [employer] of Occupational Health - Occupational Stress Questionnaire    Feeling of Stress: Not at all  Social Connections: Moderately Isolated (05/21/2024)   Social Connection and Isolation Panel    Frequency of Communication with Friends and Family: More than three times a week    Frequency of Social Gatherings with Friends and Family: More than three times a week    Attends Religious Services: More than 4 times per year    Active Member of [employer] or Organizations: No    Attends Banker Meetings: Never    Marital Status: Separated  Intimate Partner Violence: Not At Risk (05/21/2024)   Humiliation, Afraid, Rape, and Kick questionnaire    Fear of Current or Ex-Partner: No    Emotionally Abused: No    Physically Abused: No    Sexually Abused: No    Family History: Family History  Problem Relation Age of Onset   Stroke Maternal Grandfather    Heart attack Maternal Grandfather    Hypertension Mother    Diabetes Paternal Grandfather    Heart disease Paternal Grandfather    Colon cancer Neg Hx  Current Medications:  Current Outpatient Medications:    acyclovir  ointment (ZOVIRAX ) 5 %, Apply 1  Application topically every 4 (four) hours as needed., Disp: 30 g, Rfl: 3   alendronate  (FOSAMAX ) 70 MG tablet, TAKE 1 TABLET (70 MG TOTAL) BY MOUTH ONCE A WEEK. TAKE WITH A FULL GLASS OF WATER  ON AN EMPTY STOMACH., Disp: 12 tablet, Rfl: 17   allopurinol  (ZYLOPRIM ) 300 MG tablet, Take 1 tablet (300 mg total) by mouth daily., Disp: 90 tablet, Rfl: 1   anastrozole  (ARIMIDEX ) 1 MG tablet, TAKE 1 TABLET BY MOUTH EVERY DAY, Disp: 90 tablet, Rfl: 3   ARMOUR THYROID  60 MG tablet, Take 60 mg by mouth daily. 1/2 pill on Sat and Sun., Disp: , Rfl:    B Complex Vitamins (VITAMIN-B COMPLEX PO), Take 1 mL by mouth daily. Vit b12, Disp: , Rfl:    Calcium  Citrate-Vitamin D  (CITRACAL + D PO), Take by mouth., Disp: , Rfl:    folic acid  (FOLVITE ) 400 MCG tablet, Take 400 mcg by mouth daily. , Disp: , Rfl:    lisinopril -hydrochlorothiazide  (ZESTORETIC ) 20-12.5 MG tablet, Take 1 tablet by mouth daily., Disp: 90 tablet, Rfl: 1   metFORMIN  (GLUCOPHAGE ) 500 MG tablet, Take 1 tablet (500 mg total) by mouth daily., Disp: 90 tablet, Rfl: 1   rosuvastatin  (CRESTOR ) 10 MG tablet, Take 1 tablet (10 mg total) by mouth daily., Disp: 90 tablet, Rfl: 1   tirzepatide  (MOUNJARO ) 7.5 MG/0.5ML Pen, Inject 7.5 mg into the skin once a week., Disp: 2 mL, Rfl: 5   Upadacitinib  ER (RINVOQ ) 15 MG TB24, Take 1 tablet (15 mg total) by mouth daily., Disp: 30 tablet, Rfl: 11   Allergies: Allergies  Allergen Reactions   Flagyl [Metronidazole]     Severe nausea and vomiting   Other Other (See Comments)    Extreme muscle aches several days after surgery. PT SAID SHE WAS ALLERGIC TO SOME MEDICATION USED DURING A PROCEDURE BY DR TAMEA YEARS AGO. SHE DOES NOT KNOW THE NAME OF THE MEDICATION, BUT SAID THAT IT MADE HER WEAK ALL OVER.    Levaquin [Levofloxacin In D5w]     Excessive abd pains and mucousy stool    REVIEW OF SYSTEMS:   Review of Systems  Constitutional:  Negative for fatigue.  Neurological:  Negative for numbness.      VITALS:   Blood pressure (!) 147/87, pulse 85, temperature (!) 97.5 F (36.4 C), temperature source Tympanic, resp. rate 18, height 5' 4 (1.626 m), weight 154 lb (69.9 kg), SpO2 100%.  Wt Readings from Last 3 Encounters:  05/27/24 154 lb (69.9 kg)  05/21/24 155 lb (70.3 kg)  01/14/24 168 lb (76.2 kg)    Body mass index is 26.43 kg/m.  Performance status (ECOG): 1 - Symptomatic but completely ambulatory  PHYSICAL EXAM:   Physical Exam Constitutional:      Appearance: Normal appearance.  HENT:     Head: Normocephalic and atraumatic.  Eyes:     Pupils: Pupils are equal, round, and reactive to light.  Cardiovascular:     Rate and Rhythm: Normal rate and regular rhythm.     Heart sounds: Normal heart sounds. No murmur heard. Pulmonary:     Effort: Pulmonary effort is normal.     Breath sounds: Normal breath sounds. No wheezing.  Chest:     Chest wall: No mass, swelling or tenderness.  Breasts:    Right: Normal.     Left: Normal.       Comments: Lumpectomy scar.  Abdominal:     General: Bowel sounds are normal. There is no distension.     Palpations: Abdomen is soft.     Tenderness: There is no abdominal tenderness.  Musculoskeletal:        General: Normal range of motion.     Cervical back: Normal range of motion.  Skin:    General: Skin is warm and dry.     Findings: No rash.  Neurological:     Mental Status: She is alert and oriented to person, place, and time.  Psychiatric:        Judgment: Judgment normal.     LABS:      Latest Ref Rng & Units 05/19/2024    7:47 AM 09/12/2023    9:57 AM 05/14/2023    8:14 AM  CBC  WBC 4.0 - 10.5 K/uL 9.0  7.2  9.1   Hemoglobin 12.0 - 15.0 g/dL 87.0  86.7  86.5   Hematocrit 36.0 - 46.0 % 39.0  38.5  40.7   Platelets 150 - 400 K/uL 373  304  257       Latest Ref Rng & Units 05/19/2024    7:47 AM 01/09/2024    9:06 AM 10/30/2023    9:24 AM  CMP  Glucose 70 - 99 mg/dL 90     BUN 8 - 23 mg/dL 16     Creatinine  9.55 - 1.00 mg/dL 9.20     Sodium 864 - 854 mmol/L 139     Potassium 3.5 - 5.1 mmol/L 4.1     Chloride 98 - 111 mmol/L 102     CO2 22 - 32 mmol/L 26     Calcium  8.9 - 10.3 mg/dL 9.5     Total Protein 6.5 - 8.1 g/dL 7.5  7.1  7.2   Total Bilirubin 0.0 - 1.2 mg/dL 1.1  1.1  0.8   Alkaline Phos 38 - 126 U/L 47  46  49   AST 15 - 41 U/L 31  33  31   ALT 0 - 44 U/L 30  45  38      Lab Results  Component Value Date   CEA 1.1 05/09/2014   /  CEA  Date Value Ref Range Status  05/09/2014 1.1 0.0 - 5.0 ng/mL Final    Comment:    Performed at [facility]   No results found for: PSA1 No results found for: RJW800 No results found for: CAN125  No results found for: TOTALPROTELP, ALBUMINELP, A1GS, A2GS, BETS, BETA2SER, GAMS, MSPIKE, SPEI No results found for: TIBC, FERRITIN, IRONPCTSAT No results found for: LDH   STUDIES:   MM 3D SCREENING MAMMOGRAM BILATERAL BREAST Result Date: 05/21/2024 CLINICAL DATA:  Screening. EXAM: DIGITAL SCREENING BILATERAL MAMMOGRAM WITH TOMOSYNTHESIS AND CAD TECHNIQUE: Bilateral screening digital craniocaudal and mediolateral oblique mammograms were obtained. Bilateral screening digital breast tomosynthesis was performed. The images were evaluated with computer-aided detection. COMPARISON:  Previous exam(s). ACR Breast Density Category c: The breasts are heterogeneously dense, which may obscure small masses. FINDINGS: There are no findings suspicious for malignancy. IMPRESSION: No mammographic evidence of malignancy. A result letter of this screening mammogram will be mailed directly to the patient. RECOMMENDATION: Screening mammogram in one year. (Code:SM-B-01Y) BI-RADS CATEGORY  1: Negative. Electronically Signed   By: Alm Parkins M.D.   On: 05/21/2024 15:24   DG Bone Density Result Date: 05/19/2024 EXAM: DUAL X-RAY ABSORPTIOMETRY (DXA) FOR BONE MINERAL DENSITY 05/19/2024 7:30 am CLINICAL DATA:  67 year  old Female  Postmenopausal. Postmenopausal; anti-estrogen therapy Patient is or has been on glucocorticoid therapy. Patient is or has been on bone building therapies. TECHNIQUE: An axial (e.g., hips, spine) and/or appendicular (e.g., radius) exam was performed, as appropriate, using [employer] [facility]. Images are obtained for bone mineral density measurement and are not obtained for diagnostic purposes. [id number] Exclusions: Lumbar spine due to advanced degenerative changes. COMPARISON:  05/09/2022. FINDINGS: Scan quality: Good. LEFT FEMORAL NECK: BMD (in g/cm2): 0.798 T-score: -1.7 Z-score: -0.2 LEFT TOTAL HIP: BMD (in g/cm2): 0.892 T-score: -0.9 Z-score: 0.3 RIGHT FEMORAL NECK: BMD (in g/cm2): 0.821 T-score: -1.6 Z-score: -0.1 RIGHT TOTAL HIP: BMD (in g/cm2): 0.901 T-score: -0.8 Z-score: 0.4 DUAL-FEMUR TOTAL MEAN: Rate of change from previous exam: No significant rate of change from previous exam. LEFT FOREARM (RADIUS 33%): BMD (in g/cm2): 0.581 T-score: -1.8 Z-score: -0.3 Rate of change from previous exam: No significant rate of change from previous exam. FRAX 10-YEAR PROBABILITY OF FRACTURE: FRAX not reported as the patient is receiving bone building therapy. IMPRESSION: Osteopenia based on BMD. Fracture risk is unknown due to history of bone building therapy. RECOMMENDATIONS: 1. All patients should optimize calcium  and vitamin D  intake. 2. Consider FDA-approved medical therapies in postmenopausal women and men aged 56 years and older, based on the following: - A hip or vertebral (clinical or morphometric) fracture - T-score less than or equal to -2.5 and secondary causes have been excluded. - Low bone mass (T-score between -1.0 and -2.5) and a 10-year probability of a hip fracture greater than or equal to 3% or a 10-year probability of a major osteoporosis-related fracture greater than or equal to 20% based on the [place] -adapted WHO algorithm. - Clinician judgment and/or patient preferences  may indicate treatment for people with 10-year fracture probabilities above or below these levels 3. Patients with diagnosis of osteoporosis or at high risk for fracture should have regular bone mineral density tests. For patients eligible for Medicare, routine testing is allowed once every 2 years. The testing frequency can be increased to one year for patients who have rapidly progressing disease, those who are receiving or discontinuing medical therapy to restore bone mass, or have additional risk factors. Electronically Signed   By: Dina  Arceo M.D.   On: 05/19/2024 09:36

## 2024-06-23 ENCOUNTER — Other Ambulatory Visit: Payer: Self-pay | Admitting: Internal Medicine

## 2024-07-03 ENCOUNTER — Other Ambulatory Visit: Payer: Self-pay | Admitting: Family Medicine

## 2024-07-14 ENCOUNTER — Other Ambulatory Visit: Payer: Self-pay | Admitting: Family Medicine

## 2024-07-20 ENCOUNTER — Ambulatory Visit: Payer: Self-pay

## 2024-07-20 LAB — HEPATIC FUNCTION PANEL
ALT: 28 IU/L (ref 0–32)
AST: 24 IU/L (ref 0–40)
Albumin: 4.2 g/dL (ref 3.9–4.9)
Alkaline Phosphatase: 52 IU/L (ref 49–135)
Bilirubin Total: 0.7 mg/dL (ref 0.0–1.2)
Bilirubin, Direct: 0.19 mg/dL (ref 0.00–0.40)
Total Protein: 6.7 g/dL (ref 6.0–8.5)

## 2024-07-25 ENCOUNTER — Other Ambulatory Visit: Payer: Self-pay | Admitting: Family Medicine

## 2024-07-25 DIAGNOSIS — M10079 Idiopathic gout, unspecified ankle and foot: Secondary | ICD-10-CM

## 2024-07-26 ENCOUNTER — Other Ambulatory Visit: Payer: Self-pay | Admitting: Family Medicine

## 2024-07-28 ENCOUNTER — Ambulatory Visit: Admitting: Family Medicine

## 2024-07-28 ENCOUNTER — Encounter: Payer: Self-pay | Admitting: Family Medicine

## 2024-07-28 VITALS — BP 132/70 | HR 103 | Temp 97.4°F | Ht 64.0 in | Wt 152.2 lb

## 2024-07-28 DIAGNOSIS — Z7985 Long-term (current) use of injectable non-insulin antidiabetic drugs: Secondary | ICD-10-CM

## 2024-07-28 DIAGNOSIS — M10079 Idiopathic gout, unspecified ankle and foot: Secondary | ICD-10-CM | POA: Diagnosis not present

## 2024-07-28 DIAGNOSIS — E785 Hyperlipidemia, unspecified: Secondary | ICD-10-CM | POA: Diagnosis not present

## 2024-07-28 DIAGNOSIS — E119 Type 2 diabetes mellitus without complications: Secondary | ICD-10-CM

## 2024-07-28 DIAGNOSIS — I10 Essential (primary) hypertension: Secondary | ICD-10-CM | POA: Diagnosis not present

## 2024-07-28 DIAGNOSIS — E1169 Type 2 diabetes mellitus with other specified complication: Secondary | ICD-10-CM

## 2024-07-28 DIAGNOSIS — E038 Other specified hypothyroidism: Secondary | ICD-10-CM

## 2024-07-28 NOTE — Progress Notes (Signed)
 "  Subjective:    Patient ID: Beth Hamilton, female    DOB: 11/04/1955, 69 y.o.   MRN: [id number]  HPI Discussed the use of AI scribe software for clinical note transcription with the patient, who gave verbal consent to proceed.  History of Present Illness   Beth Hamilton is a 69 year old female with diabetes, Crohn's disease, and osteoporosis who presents for routine follow-up and lab review.  She is currently on Rinvoq  for Chrons , which she finds effective. She has been taking cholesterol medication for four months and is concerned about its impact on her liver enzymes, which had previously increased slightly. Recent blood work showed normal results.  She is also on Mounjaro , which has helped with her cholesterol and blood pressure. Her LDL cholesterol was previously around 129-149 mg/dL before medication, but recent levels were 83 mg/dL. Her liver enzymes showed a slight increase in June, but were not significantly elevated. She is on a low dose of cholesterol medication.  She has a history of Crohn's disease and diabetes. She has experienced significant improvement in her bowel habits, now having bowel movements once a day or every other day, compared to 10-12 times a day previously. This improvement has allowed her to feel 'back to normal' and more active. She is managing her diabetes with metformin , taking one pill a day.  Her weight has decreased, and she reports making healthy dietary choices. She is mindful of her carbohydrate intake, opting for small portions and sugar-free bread. She feels much better overall and is pleased with her current health status.  She is on several medications including gout medication, blood pressure medication, metformin  for diabetes, fosamaxx once a week, and Arimidex  daily for cancer prevention; she reports she has been on Arimidex  for almost ten years and was told by Dr. Geofm to finish her current bottle. She takes Fosamax  once a week for osteoporosis  and Armour thyroid  daily, with a follow-up scheduled with her endocrinologist in February. ( Dr Faythe)  She does not use MyChart for lab results and prefers to be contacted by phone or email.        Review of Systems     Objective:   Physical Exam  General-in no acute distress Eyes-no discharge Lungs-respiratory rate normal, CTA CV-no murmurs,RRR Extremities skin warm dry no edema Neuro grossly normal Behavior normal, alert  Type 2 diabetes mellitus with hyperlipidemia LDL improved to 83 mg/dL, liver enzymes slightly elevated but stable. Balancing cholesterol management with liver enzyme levels is crucial. - Ordered blood tests for cholesterol and liver enzymes. - Monitor LDL aiming for <70 mg/dL. - Continue current cholesterol medication. - Ordered urine test for proteinuria.  Crohn's disease Well-controlled with current medication, significant improvement in bowel movements. - Continue current medication regimen.  Essential hypertension - Continue current blood pressure medication regimen.  Idiopathic gout Managed with regular medication. - Continue current gout medication regimen.  Osteoporosis Managed with Fosamax , recent bone density and mammogram results good. - Continue Fosamax  once a week.  Other specified hypothyroidism Managed with Armour thyroid , follow-up with endocrinologist scheduled. - Continue Armour thyroid  once a day. - Ordered thyroid  function tests. - Mail lab results for review with Dr. Faythe.  General health maintenance Healthy dietary choices and physical activity contributing to weight loss and improved health. - Continue healthy dietary choices and physical activity.     Assessment & Plan:  1. HTN (hypertension), benign (Primary) Blood pressure good control continue current meds check lab work  await results - Basic metabolic panel with GFR - Microalbumin/Creatinine Ratio, Urine  2. Diabetes mellitus without complication (HCC) Under  good control with medication-check lab work will give patient feedback regarding how the numbers look - Hemoglobin A1c - Microalbumin/Creatinine Ratio, Urine  3. Hyperlipidemia associated with type 2 diabetes mellitus (HCC) Patient recently stopped statin because of elevated liver enzyme findings I did tell her that if her cholesterol is elevated it would be wise to restart the statin but we would have to monitor her liver enzymes and avoid liver enzymes going up in the 2 times range - Lipid panel  4. Idiopathic gout involving toe, unspecified chronicity, unspecified laterality Good control - Uric acid  5. Other specified hypothyroidism She will see her specialist in February check labs - TSH - T4, Free  Will appear 6 months "

## 2024-08-07 LAB — MICROALBUMIN / CREATININE URINE RATIO
Creatinine, Urine: 32.1 mg/dL
Microalb/Creat Ratio: <9 mg/g{creat} (ref 0–29)
Microalbumin, Urine: <3 ug/mL

## 2024-08-07 LAB — BASIC METABOLIC PANEL WITH GFR
BUN/Creatinine Ratio: 24 (ref 12–28)
BUN: 18 mg/dL (ref 8–27)
CO2: 23 mmol/L (ref 20–29)
Calcium: 9.9 mg/dL (ref 8.7–10.3)
Chloride: 100 mmol/L (ref 96–106)
Creatinine, Ser: 0.74 mg/dL (ref 0.57–1.00)
Glucose: 91 mg/dL (ref 70–99)
Potassium: 4.5 mmol/L (ref 3.5–5.2)
Sodium: 137 mmol/L (ref 134–144)
eGFR: 89 mL/min/1.73

## 2024-08-07 LAB — LIPID PANEL
Chol/HDL Ratio: 4 ratio (ref 0.0–4.4)
Cholesterol, Total: 246 mg/dL — ABNORMAL HIGH (ref 100–199)
HDL: 62 mg/dL
LDL Chol Calc (NIH): 156 mg/dL — ABNORMAL HIGH (ref 0–99)
Triglycerides: 156 mg/dL — ABNORMAL HIGH (ref 0–149)
VLDL Cholesterol Cal: 28 mg/dL (ref 5–40)

## 2024-08-07 LAB — TSH: TSH: 0.936 u[IU]/mL (ref 0.450–4.500)

## 2024-08-07 LAB — HEMOGLOBIN A1C
Est. average glucose Bld gHb Est-mCnc: 103 mg/dL
Hgb A1c MFr Bld: 5.2 % (ref 4.8–5.6)

## 2024-08-07 LAB — URIC ACID: Uric Acid: 2.5 mg/dL — ABNORMAL LOW (ref 3.0–7.2)

## 2024-08-07 LAB — T4, FREE: Free T4: 1.14 ng/dL (ref 0.82–1.77)

## 2024-08-08 ENCOUNTER — Ambulatory Visit: Payer: Self-pay | Admitting: Family Medicine

## 2024-08-10 ENCOUNTER — Other Ambulatory Visit: Payer: Self-pay | Admitting: Family Medicine

## 2024-08-11 ENCOUNTER — Other Ambulatory Visit: Payer: Self-pay | Admitting: *Deleted

## 2024-08-11 MED ORDER — ALENDRONATE SODIUM 70 MG PO TABS: 70.0000 mg | ORAL_TABLET | ORAL | 17 refills | Status: AC

## 2024-08-19 ENCOUNTER — Other Ambulatory Visit: Payer: Self-pay | Admitting: Family Medicine

## 2025-01-26 ENCOUNTER — Ambulatory Visit: Admitting: Family Medicine

## 2025-05-20 ENCOUNTER — Inpatient Hospital Stay

## 2025-05-20 ENCOUNTER — Ambulatory Visit (HOSPITAL_COMMUNITY)

## 2025-06-02 ENCOUNTER — Inpatient Hospital Stay: Admitting: Oncology
